# Patient Record
Sex: Female | Born: 1945 | Race: White | Hispanic: No | Smoking: Never smoker
Health system: Southern US, Community
[De-identification: ages and names within clinical notes are randomized; demographics above are authoritative.]

## PROBLEM LIST (undated history)

## (undated) DIAGNOSIS — D649 Anemia, unspecified: Secondary | ICD-10-CM

## (undated) DIAGNOSIS — E1169 Type 2 diabetes mellitus with other specified complication: Secondary | ICD-10-CM

## (undated) DIAGNOSIS — F32A Depression, unspecified: Secondary | ICD-10-CM

## (undated) DIAGNOSIS — Z992 Dependence on renal dialysis: Secondary | ICD-10-CM

## (undated) DIAGNOSIS — G4733 Obstructive sleep apnea (adult) (pediatric): Secondary | ICD-10-CM

## (undated) DIAGNOSIS — E785 Hyperlipidemia, unspecified: Secondary | ICD-10-CM

## (undated) DIAGNOSIS — K921 Melena: Secondary | ICD-10-CM

## (undated) DIAGNOSIS — N189 Chronic kidney disease, unspecified: Secondary | ICD-10-CM

## (undated) DIAGNOSIS — Z9189 Other specified personal risk factors, not elsewhere classified: Secondary | ICD-10-CM

## (undated) DIAGNOSIS — T8859XA Other complications of anesthesia, initial encounter: Secondary | ICD-10-CM

## (undated) DIAGNOSIS — N186 End stage renal disease: Secondary | ICD-10-CM

## (undated) DIAGNOSIS — T753XXA Motion sickness, initial encounter: Secondary | ICD-10-CM

## (undated) DIAGNOSIS — E118 Type 2 diabetes mellitus with unspecified complications: Secondary | ICD-10-CM

## (undated) DIAGNOSIS — I10 Essential (primary) hypertension: Secondary | ICD-10-CM

## (undated) DIAGNOSIS — R0600 Dyspnea, unspecified: Secondary | ICD-10-CM

## (undated) DIAGNOSIS — M1990 Unspecified osteoarthritis, unspecified site: Secondary | ICD-10-CM

## (undated) HISTORY — PX: REPLACEMENT TOTAL KNEE BILATERAL: SUR1225

## (undated) HISTORY — PX: ABDOMINAL HYSTERECTOMY: SHX81

## (undated) HISTORY — DX: Chronic kidney disease, unspecified: N18.9

## (undated) HISTORY — DX: Unspecified osteoarthritis, unspecified site: M19.90

## (undated) HISTORY — DX: Essential (primary) hypertension: I10

## (undated) HISTORY — DX: Obstructive sleep apnea (adult) (pediatric): G47.33

## (undated) HISTORY — DX: Dependence on renal dialysis: N18.6

## (undated) HISTORY — DX: Type 2 diabetes mellitus with unspecified complications: E11.8

## (undated) HISTORY — PX: APPENDECTOMY: SHX54

## (undated) HISTORY — PX: TONSILECTOMY, ADENOIDECTOMY, BILATERAL MYRINGOTOMY AND TUBES: SHX2538

## (undated) HISTORY — DX: End stage renal disease: Z99.2

## (undated) HISTORY — DX: Melena: K92.1

## (undated) HISTORY — DX: Depression, unspecified: F32.A

## (undated) HISTORY — DX: Other specified personal risk factors, not elsewhere classified: Z91.89

## (undated) HISTORY — DX: Hyperlipidemia, unspecified: E11.69

## (undated) HISTORY — DX: Hyperlipidemia, unspecified: E78.5

## (undated) HISTORY — PX: TOTAL HIP ARTHROPLASTY: SHX124

## (undated) HISTORY — PX: CHOLECYSTECTOMY: SHX55

---

## 2021-05-14 ENCOUNTER — Other Ambulatory Visit: Payer: Self-pay

## 2021-05-15 ENCOUNTER — Ambulatory Visit (INDEPENDENT_AMBULATORY_CARE_PROVIDER_SITE_OTHER): Payer: Medicare Other | Admitting: Internal Medicine

## 2021-05-15 ENCOUNTER — Encounter: Payer: Self-pay | Admitting: Internal Medicine

## 2021-05-15 ENCOUNTER — Other Ambulatory Visit: Payer: Self-pay | Admitting: Internal Medicine

## 2021-05-15 VITALS — BP 148/82 | HR 77 | Temp 97.8°F | Ht 67.0 in | Wt 183.3 lb

## 2021-05-15 DIAGNOSIS — E1169 Type 2 diabetes mellitus with other specified complication: Secondary | ICD-10-CM

## 2021-05-15 DIAGNOSIS — E118 Type 2 diabetes mellitus with unspecified complications: Secondary | ICD-10-CM

## 2021-05-15 DIAGNOSIS — R296 Repeated falls: Secondary | ICD-10-CM

## 2021-05-15 DIAGNOSIS — I10 Essential (primary) hypertension: Secondary | ICD-10-CM | POA: Insufficient documentation

## 2021-05-15 DIAGNOSIS — E559 Vitamin D deficiency, unspecified: Secondary | ICD-10-CM | POA: Insufficient documentation

## 2021-05-15 DIAGNOSIS — D631 Anemia in chronic kidney disease: Secondary | ICD-10-CM | POA: Insufficient documentation

## 2021-05-15 DIAGNOSIS — G4733 Obstructive sleep apnea (adult) (pediatric): Secondary | ICD-10-CM

## 2021-05-15 DIAGNOSIS — E781 Pure hyperglyceridemia: Secondary | ICD-10-CM | POA: Insufficient documentation

## 2021-05-15 DIAGNOSIS — E785 Hyperlipidemia, unspecified: Secondary | ICD-10-CM

## 2021-05-15 DIAGNOSIS — N186 End stage renal disease: Secondary | ICD-10-CM | POA: Diagnosis not present

## 2021-05-15 DIAGNOSIS — E1165 Type 2 diabetes mellitus with hyperglycemia: Secondary | ICD-10-CM | POA: Insufficient documentation

## 2021-05-15 DIAGNOSIS — M5137 Other intervertebral disc degeneration, lumbosacral region: Secondary | ICD-10-CM

## 2021-05-15 DIAGNOSIS — R7989 Other specified abnormal findings of blood chemistry: Secondary | ICD-10-CM

## 2021-05-15 DIAGNOSIS — M51379 Other intervertebral disc degeneration, lumbosacral region without mention of lumbar back pain or lower extremity pain: Secondary | ICD-10-CM

## 2021-05-15 DIAGNOSIS — Z992 Dependence on renal dialysis: Secondary | ICD-10-CM | POA: Diagnosis not present

## 2021-05-15 LAB — CBC WITH DIFFERENTIAL/PLATELET
Basophils Absolute: 0 10*3/uL (ref 0.0–0.1)
Basophils Relative: 0.2 % (ref 0.0–3.0)
Eosinophils Absolute: 0 10*3/uL (ref 0.0–0.7)
Eosinophils Relative: 0 % (ref 0.0–5.0)
HCT: 28.7 % — ABNORMAL LOW (ref 36.0–46.0)
Hemoglobin: 10.3 g/dL — ABNORMAL LOW (ref 12.0–15.0)
Lymphocytes Relative: 10 % — ABNORMAL LOW (ref 12.0–46.0)
Lymphs Abs: 1.3 10*3/uL (ref 0.7–4.0)
MCHC: 35.9 g/dL (ref 30.0–36.0)
MCV: 83.9 fl (ref 78.0–100.0)
Monocytes Absolute: 0.8 10*3/uL (ref 0.1–1.0)
Monocytes Relative: 6 % (ref 3.0–12.0)
Neutro Abs: 10.7 10*3/uL — ABNORMAL HIGH (ref 1.4–7.7)
Neutrophils Relative %: 83.8 % — ABNORMAL HIGH (ref 43.0–77.0)
Platelets: 240 10*3/uL (ref 150.0–400.0)
RBC: 3.43 Mil/uL — ABNORMAL LOW (ref 3.87–5.11)
RDW: 15.5 % (ref 11.5–15.5)
WBC: 12.8 10*3/uL — ABNORMAL HIGH (ref 4.0–10.5)

## 2021-05-15 LAB — COMPREHENSIVE METABOLIC PANEL
ALT: 11 U/L (ref 0–35)
AST: 9 U/L (ref 0–37)
Albumin: 3.5 g/dL (ref 3.5–5.2)
Alkaline Phosphatase: 60 U/L (ref 39–117)
BUN: 81 mg/dL — ABNORMAL HIGH (ref 6–23)
CO2: 28 mEq/L (ref 19–32)
Calcium: 8 mg/dL — ABNORMAL LOW (ref 8.4–10.5)
Chloride: 91 mEq/L — ABNORMAL LOW (ref 96–112)
Creatinine, Ser: 6.41 mg/dL (ref 0.40–1.20)
GFR: 6.03 mL/min — CL (ref >60.00)
Glucose, Bld: 558 mg/dL (ref 70–99)
Potassium: 3.7 mEq/L (ref 3.5–5.1)
Sodium: 130 mEq/L — ABNORMAL LOW (ref 135–145)
Total Bilirubin: 0.4 mg/dL (ref 0.2–1.2)
Total Protein: 5.9 g/dL — ABNORMAL LOW (ref 6.0–8.3)

## 2021-05-15 LAB — TSH: TSH: 0.23 u[IU]/mL — ABNORMAL LOW (ref 0.35–4.50)

## 2021-05-15 LAB — POCT GLYCOSYLATED HEMOGLOBIN (HGB A1C): Hemoglobin A1C: 10.3 % — AB (ref 4.0–5.6)

## 2021-05-15 LAB — LDL CHOLESTEROL, DIRECT: Direct LDL: 77 mg/dL

## 2021-05-15 LAB — LIPID PANEL
Cholesterol: 224 mg/dL — ABNORMAL HIGH (ref 0–200)
HDL: 35.3 mg/dL — ABNORMAL LOW (ref >39.00)
Total CHOL/HDL Ratio: 6
Triglycerides: 526 mg/dL — ABNORMAL HIGH (ref 0.0–149.0)

## 2021-05-15 LAB — VITAMIN B12: Vitamin B-12: 267 pg/mL (ref 211–911)

## 2021-05-15 LAB — VITAMIN D 25 HYDROXY (VIT D DEFICIENCY, FRACTURES): VITD: 9.73 ng/mL — ABNORMAL LOW (ref 30.00–100.00)

## 2021-05-15 MED ORDER — ATORVASTATIN CALCIUM 80 MG PO TABS: 80.0000 mg | ORAL_TABLET | Freq: Every day | ORAL | 1 refills | Status: DC

## 2021-05-15 MED ORDER — VITAMIN D (ERGOCALCIFEROL) 1.25 MG (50000 UNIT) PO CAPS: 50000.0000 [IU] | ORAL_CAPSULE | ORAL | 0 refills | Status: AC

## 2021-05-15 MED ORDER — INSULIN GLARGINE 100 UNIT/ML SOLOSTAR PEN
5.0000 [IU] | PEN_INJECTOR | Freq: Every day | SUBCUTANEOUS | 4 refills | Status: DC
Start: 2021-05-15 — End: 2021-08-15

## 2021-05-15 NOTE — Addendum Note (Signed)
Addended by: Wyvonne Lenz on: 05/15/2021 03:52 PM   Modules accepted: Orders

## 2021-05-15 NOTE — Patient Instructions (Signed)
-  Nice seeing you today!!  -Lab work today; will notify you once results are available.  -Referrals to nephrology, neurology and endocrinology today.  -Schedule follow up in 3 months.

## 2021-05-15 NOTE — Addendum Note (Signed)
Addended by: Wyvonne Lenz on: 05/15/2021 03:07 PM   Modules accepted: Orders

## 2021-05-15 NOTE — Progress Notes (Signed)
New Patient Office Visit     This visit occurred during the SARS-CoV-2 public health emergency.  Safety protocols were in place, including screening questions prior to the visit, additional usage of staff PPE, and extensive cleaning of exam room while observing appropriate contact time as indicated for disinfecting solutions.    CC/Reason for Visit: Establish care, discuss chronic conditions Previous PCP: In [place] Last Visit: Few months ago  HPI: Felicia Thompson is a 75 y.o. female who is coming in today for the above mentioned reasons.  She has an extensive recent past, however I have reviewed records available to me today.  She moved from [place] just shy of a month ago to be closer to family.  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  She has not yet established care with nephrology in the area.  She brought supplies from home.  She has already been hooked up with [facility] nursing.  She has a history of uncontrolled hypertension, diabetes, hyperlipidemia.  She was recently diagnosed with sleep apnea but does not have a CPAP machine or supplies.  She has been falling recently.  She has had 2 falls in the last 2 weeks.  During the first fall she had significant back pain, saw local orthopedics, x-rays showed significant back arthritis and possibly a slipped disc.  MRI is in process.  Daughter believes she might be having some depression and is requesting counseling services.  She was being worked up for a kidney transplant prior to leaving [place].  She had a recent mammogram, Pap smear and colonoscopy which she will try and obtain records for.  Past Medical/Surgical History: Past Medical History:  Diagnosis Date   DM (diabetes mellitus), type 2 with complications ([place])    ESRD on peritoneal dialysis ([place])    Hyperlipidemia associated with type 2 diabetes mellitus ([place])    Hypertension    OSA (obstructive sleep apnea)     History  reviewed. No pertinent surgical history.  Social History:  has no history on file for tobacco use, alcohol use, and drug use.  Allergies: Not on File  Family History:  History reviewed. No pertinent family history.   Current Outpatient Medications:    atorvastatin (LIPITOR) 40 MG tablet, Take 40 mg by mouth daily., Disp: , Rfl:    calcium carbonate (TUMS EX) 750 MG chewable tablet, Chew 1 tablet by mouth 2 (two) times daily., Disp: , Rfl:    carvedilol (COREG) 25 MG tablet, Take 25 mg by mouth 2 (two) times daily with a meal., Disp: , Rfl:    cyclobenzaprine (FLEXERIL) 5 MG tablet, Take 5 mg by mouth 3 (three) times daily., Disp: , Rfl:    diphenhydramine-acetaminophen (TYLENOL PM) 25-500 MG TABS tablet, Take 1 tablet by mouth at bedtime as needed., Disp: , Rfl:    glipiZIDE (GLUCOTROL) 5 MG tablet, Take by mouth 2 (two) times daily before a meal., Disp: , Rfl:    hydrALAZINE (APRESOLINE) 50 MG tablet, Take 50 mg by mouth 3 (three) times daily., Disp: , Rfl:    losartan (COZAAR) 25 MG tablet, Take 25 mg by mouth 2 (two) times daily., Disp: , Rfl:    Probiotic Product ([employer] ADV PROBIOTIC GUMMIES PO), Take by mouth., Disp: , Rfl:   Review of Systems:  Constitutional: Denies fever, chills, diaphoresis, appetite change and fatigue.  HEENT: Denies photophobia, eye pain, redness, hearing loss, ear pain, congestion, sore throat, rhinorrhea, sneezing, mouth sores, trouble swallowing, neck  pain, neck stiffness and tinnitus.   Respiratory: Denies SOB, DOE, cough, chest tightness,  and wheezing.   Cardiovascular: Denies chest pain, palpitations and leg swelling.  Gastrointestinal: Denies nausea, vomiting, abdominal pain, diarrhea, constipation, blood in stool and abdominal distention.  Genitourinary: Denies dysuria, urgency, frequency, hematuria, flank pain and difficulty urinating.  Endocrine: Denies: hot or cold intolerance, sweats, changes in hair or nails, polyuria,  polydipsia. Musculoskeletal: Denies myalgias,  joint swelling, arthralgias and gait problem.  Skin: Denies pallor, rash and wound.  Neurological: Denies dizziness, seizures, syncope, weakness, light-headedness, numbness and headaches.  Hematological: Denies adenopathy. Easy bruising, personal or family bleeding history  Psychiatric/Behavioral: Denies suicidal ideation, mood changes, confusion, nervousness, sleep disturbance and agitation    Physical Exam: Vitals:   05/15/21 1343  BP: (!) 148/82  Pulse: 77  Temp: 97.8 F (36.6 C)  TempSrc: Oral  SpO2: 96%  Weight: 183 lb 5 oz (83.2 kg)  Height: '5\' 7"'$  (1.702 m)   Body mass index is 28.71 kg/m.  Constitutional: NAD, calm, comfortable Eyes: PERRL, lids and conjunctivae normal, wears corrective lenses ENMT: Mucous membranes are moist.  Respiratory: clear to auscultation bilaterally, no wheezing, no crackles. Normal respiratory effort. No accessory muscle use.  Cardiovascular: Regular rate and rhythm, no murmurs / rubs / gallops.  Neurologic: Grossly intact and nonfocal Psychiatric: Normal judgment and insight. Alert and oriented x 3. Normal mood.    Impression and Plan:  ESRD on peritoneal dialysis [facility])  -I will place an urgent referral to nephrology, check labs today.  OSA (obstructive sleep apnea)  - Plan: Ambulatory referral to Neurology/sleep medicine.  DM (diabetes mellitus), type 2 with complications ([place])  - Plan: Ambulatory referral to Endocrinology -Uncontrolled with an A1c of 10.3 today. -Glipizide probably not best medication given advanced kidney failure.  Hyperlipidemia associated with type 2 diabetes mellitus ([place]) -  Plan: Lipid panel -Currently on atorvastatin 40 mg daily.  Primary hypertension -Uncontrolled, likely due to to advanced renal failure, send referral to nephrology.  Frequent falls -Check TSH, vitamin D, vitamin B12. -Arrange for home health PT and OT.  DDD (degenerative disc disease),  lumbosacral -Under current work-up by orthopedics, MRI to happen soon.    Patient Instructions  -Nice seeing you today!!  -Lab work today; will notify you once results are available.  -Referrals to nephrology, neurology and endocrinology today.  -Schedule follow up in 3 months.    Lelon Frohlich, MD [facility]

## 2021-05-15 NOTE — Addendum Note (Signed)
Addended by: Amanda Cockayne on: 05/15/2021 04:41 PM   Modules accepted: Orders

## 2021-05-16 ENCOUNTER — Other Ambulatory Visit: Payer: Medicare Other

## 2021-05-16 LAB — T3, FREE: T3, Free: 1.9 pg/mL — ABNORMAL LOW (ref 2.3–4.2)

## 2021-05-16 LAB — T4, FREE: Free T4: 1.1 ng/dL (ref 0.60–1.60)

## 2021-05-16 NOTE — Addendum Note (Signed)
Addended by: Agnes Lawrence on: 05/16/2021 04:15 PM   Modules accepted: Orders

## 2021-05-21 ENCOUNTER — Telehealth: Payer: Self-pay | Admitting: Internal Medicine

## 2021-05-21 NOTE — Telephone Encounter (Signed)
Pts daughter called in stating that the pts blood sugars are running high and they have not been giving her the insulin due to them not knowing that they are needing needle to administer it to the pt. She stated that the pts sugars today before she called in and the pt had not eaten and it was 325 and did not know what to do call was transferred to the triage nurse for further assistance.

## 2021-05-22 MED ORDER — PEN NEEDLES 31G X 8 MM MISC: 1.0000 | Freq: Every day | 3 refills | Status: DC

## 2021-05-22 NOTE — Telephone Encounter (Signed)
Spoke with daughter and Rx for pen needles sent

## 2021-05-28 ENCOUNTER — Telehealth: Payer: Self-pay | Admitting: Internal Medicine

## 2021-05-28 NOTE — Telephone Encounter (Signed)
Felicia Thompson,Pt with [facility] is calling needing verbal order for PT 2 week 2,  1 week 5 for strengthening, balance and endurance.  May leave a detail msg on his secured voice mail.

## 2021-05-29 NOTE — Telephone Encounter (Signed)
Left detailed message on machine for Felicia Thompson with verbal orders for as requested.

## 2021-05-29 NOTE — Telephone Encounter (Signed)
Okay for verbal orders. 

## 2021-07-17 ENCOUNTER — Institutional Professional Consult (permissible substitution): Payer: Medicare Other | Admitting: Neurology

## 2021-08-15 ENCOUNTER — Other Ambulatory Visit: Payer: Self-pay

## 2021-08-15 ENCOUNTER — Ambulatory Visit (INDEPENDENT_AMBULATORY_CARE_PROVIDER_SITE_OTHER): Payer: Medicare Other | Admitting: Endocrinology

## 2021-08-15 VITALS — BP 144/64 | HR 86 | Ht 67.0 in | Wt 179.6 lb

## 2021-08-15 DIAGNOSIS — E118 Type 2 diabetes mellitus with unspecified complications: Secondary | ICD-10-CM | POA: Diagnosis not present

## 2021-08-15 LAB — POCT GLYCOSYLATED HEMOGLOBIN (HGB A1C): Hemoglobin A1C: 9 % — AB (ref 4.0–5.6)

## 2021-08-15 MED ORDER — NOVOLIN 70/30 RELION (70-30) 100 UNIT/ML ~~LOC~~ SUSP: 10.0000 [IU] | Freq: Every day | SUBCUTANEOUS | 11 refills | Status: DC

## 2021-08-15 MED ORDER — FREESTYLE LIBRE 2 READER DEVI: 1.0000 | Freq: Once | 1 refills | Status: AC

## 2021-08-15 MED ORDER — FREESTYLE LIBRE 2 SENSOR MISC: 1.0000 | 3 refills | Status: DC

## 2021-08-15 NOTE — Patient Instructions (Addendum)
good diet and exercise significantly improve the control of your diabetes.  please let me know if you wish to be referred to a dietician.  high blood sugar is very risky to your health.  you should see an eye doctor and dentist every year.  It is very important to get all recommended vaccinations.  Controlling your blood pressure and cholesterol drastically reduces the damage diabetes does to your body.  Those who smoke should quit.  Please discuss these with your doctor.  check your blood sugar twice a day.  vary the time of day when you check, between before the 3 meals, and at bedtime.  also check if you have symptoms of your blood sugar being too high or too low.  please keep a record of the readings and bring it to your next appointment here (or you can bring the meter itself).  You can write it on any piece of paper.  please call us sooner if your blood sugar goes below 70, or if most of your readings are over 200. We will need to take this complex situation in stages For now, please continue the same glipizide, and: I have sent 2 prescriptions to your pharmacy: to change Lantus to 70/30 insulin, 10 units with breakfast, and for the continuous glucose monitor On this type of insulin schedule, you should eat meals on a regular schedule.  If a meal is missed or significantly delayed, your blood sugar could go low. Please call or message [place] next week, to tell us how the blood sugar is doing.   Please come back for a follow-up appointment in 2 months.

## 2021-08-15 NOTE — Progress Notes (Signed)
Subjective:    Patient ID: Felicia Thompson, female    DOB: 09-Jul-1946, 75 y.o.   MRN: [id number]  HPI Pt is here with dtr, who cares for her, and provides some hx, due to pt's memory loss.  pt is referred by Dr Doreatha Lew, for diabetes.  Pt states DM was dx'ed in [id number]; it is complicated by ESRD (due to NSAID); she takes overnight PD; she has been on insulin 2 mos ago; pt says her diet and exercise are not good; she has never had GDM, pancreatic surgery, severe hypoglycemia or DKA.  She takes glipizide and Lantus 5/d.  She had pancreatitis ([place]) in approx 2000.  she brings a record of her cbg's which I have reviewed today.  Cbg varies from 97-498.  It is in general higher as the day goes on.  She says meds are too expensive.   Past Medical History:  Diagnosis Date   Arthritis    Blood in stool    when appendix burst   Chronic kidney disease    Depression    DM (diabetes mellitus), type 2 with complications ([facility])    ESRD on peritoneal dialysis ([place])    History of fainting spells of unknown cause    Hyperlipidemia    Hyperlipidemia associated with type 2 diabetes mellitus ([facility])    Hypertension    OSA (obstructive sleep apnea)     Past Surgical History:  Procedure Laterality Date   ABDOMINAL HYSTERECTOMY     APPENDECTOMY     CHOLECYSTECTOMY     TONSILECTOMY, ADENOIDECTOMY, BILATERAL MYRINGOTOMY AND TUBES      Social History   Socioeconomic History   Marital status: Widowed    Spouse name: Not on file   Number of children: Not on file   Years of education: Not on file   Highest education level: Not on file  Occupational History   Not on file  Tobacco Use   Smoking status: Never   Smokeless tobacco: Never  Substance and Sexual Activity   Alcohol use: Never   Drug use: Never   Sexual activity: Not Currently  Other Topics Concern   Not on file  Social History Narrative   Not on file   Social Determinants of Health   Financial Resource Strain: Not on file  Food  Insecurity: Not on file  Transportation Needs: Not on file  Physical Activity: Not on file  Stress: Not on file  Social Connections: Not on file  Intimate Partner Violence: Not on file    Current Outpatient Medications on File Prior to Visit  Medication Sig Dispense Refill   atorvastatin (LIPITOR) 80 MG tablet Take 1 tablet (80 mg total) by mouth daily. 90 tablet 1   calcium carbonate (TUMS EX) 750 MG chewable tablet Chew 1 tablet by mouth 2 (two) times daily.     carvedilol (COREG) 25 MG tablet Take 25 mg by mouth 2 (two) times daily with a meal.     cyclobenzaprine (FLEXERIL) 5 MG tablet Take 5 mg by mouth 3 (three) times daily.     diphenhydramine-acetaminophen (TYLENOL PM) 25-500 MG TABS tablet Take 1 tablet by mouth at bedtime as needed.     hydrALAZINE (APRESOLINE) 50 MG tablet Take 50 mg by mouth 3 (three) times daily.     losartan (COZAAR) 25 MG tablet Take 25 mg by mouth 2 (two) times daily.     Probiotic Product (CVS ADV PROBIOTIC GUMMIES PO) Take by mouth.     No current  facility-administered medications on file prior to visit.    Allergies  Allergen Reactions   Tape Other (See Comments)    Blisters   Tetracyclines & Related Rash    Family History  Problem Relation Age of Onset   Hearing loss Father    Hyperlipidemia Father    Heart disease Father    Early death Father    Cancer Father    Early death Brother    Drug abuse Brother    Diabetes Brother    Depression Brother    Alcohol abuse Brother    Miscarriages / [place] Daughter    Arthritis Daughter    Depression Daughter    Early death Maternal Grandmother    Heart attack Maternal Grandmother    Heart disease Maternal Grandmother    Hearing loss Paternal Grandmother    Heart disease Paternal Grandmother    Heart disease Paternal Grandfather    Early death Paternal Grandfather    Heart attack Paternal Grandfather     BP (!) 144/64 (BP Location: Right Arm, Patient Position: Sitting, Cuff Size:  Large)   Pulse 86   Ht '5\' 7"'$  (1.702 m)   Wt 179 lb 9.6 oz (81.5 kg)   SpO2 98%   BMI 28.13 kg/m   Review of Systems denies sob, and N/V/HB. She has lost 20 lbs x 8 mos.      Objective:   Physical Exam Pulses: dorsalis pedis intact bilat.   MSK: no deformity of the feet CV: 1+ bilat leg edema Skin:  no ulcer on the feet.  normal color and temp on the feet. Neuro: sensation is intact to touch on the feet, but decreased from normal Ext: there is bilateral onychomycosis of the toenails  Lab Results  Component Value Date   CREATININE 6.41 (HH) 05/15/2021   BUN 81 (H) 05/15/2021   NA 130 (L) 05/15/2021   K 3.7 05/15/2021   CL 91 (L) 05/15/2021   CO2 28 05/15/2021   Lab Results  Component Value Date   HGBA1C 9.0 (A) 08/15/2021        Assessment & Plan:  Insulin-requiring type 2 DM: uncontrolled.  Therapy is limited by pt's request for least expensive meds  Patient Instructions  good diet and exercise significantly improve the control of your diabetes.  please let me know if you wish to be referred to a dietician.  high blood sugar is very risky to your health.  you should see an eye doctor and dentist every year.  It is very important to get all recommended vaccinations.  Controlling your blood pressure and cholesterol drastically reduces the damage diabetes does to your body.  Those who smoke should quit.  Please discuss these with your doctor.  check your blood sugar twice a day.  vary the time of day when you check, between before the 3 meals, and at bedtime.  also check if you have symptoms of your blood sugar being too high or too low.  please keep a record of the readings and bring it to your next appointment here (or you can bring the meter itself).  You can write it on any piece of paper.  please call us sooner if your blood sugar goes below 70, or if most of your readings are over 200. We will need to take this complex situation in stages For now, please continue the  same glipizide, and: I have sent 2 prescriptions to your pharmacy: to change Lantus to 70/30 insulin, 10 units with  breakfast, and for the continuous glucose monitor On this type of insulin schedule, you should eat meals on a regular schedule.  If a meal is missed or significantly delayed, your blood sugar could go low. Please call or message [place] next week, to tell us how the blood sugar is doing.   Please come back for a follow-up appointment in 2 months.

## 2021-08-16 ENCOUNTER — Telehealth: Payer: Self-pay | Admitting: Pharmacy Technician

## 2021-08-16 NOTE — Telephone Encounter (Signed)
Patient Advocate Encounter   Received notification from [facility] that prior authorization for NOVOLIN 70/30 is required.   PA submitted on 08/16/21 Key [id number] Status is pending    [facility] will continue to follow   Burney Gauze, CPhT Patient [facility] Phone: [phone] Fax:  [phone]

## 2021-08-16 NOTE — Telephone Encounter (Signed)
Received a fax regarding Prior Authorization from [facility] for [place] 70/30. Authorization has been DENIED because PT'S PLAN PREFERS [place] 70/30 [id number].

## 2021-08-17 ENCOUNTER — Other Ambulatory Visit (HOSPITAL_COMMUNITY): Payer: Self-pay

## 2021-08-17 ENCOUNTER — Other Ambulatory Visit: Payer: Medicare Other

## 2021-08-17 ENCOUNTER — Other Ambulatory Visit: Payer: Self-pay | Admitting: Endocrinology

## 2021-08-17 MED ORDER — HUMULIN 70/30 (70-30) 100 UNIT/ML ~~LOC~~ SUSP: 10.0000 [IU] | Freq: Every day | SUBCUTANEOUS | 11 refills | Status: AC

## 2021-08-17 NOTE — Telephone Encounter (Signed)
Ok great. Please write/submit a new script for Humulin 70/30 for the pharmacy to process. Thanks, have a great day!

## 2021-09-01 DIAGNOSIS — E875 Hyperkalemia: Secondary | ICD-10-CM | POA: Diagnosis not present

## 2021-09-01 DIAGNOSIS — N186 End stage renal disease: Secondary | ICD-10-CM | POA: Diagnosis not present

## 2021-09-01 DIAGNOSIS — R82998 Other abnormal findings in urine: Secondary | ICD-10-CM | POA: Diagnosis not present

## 2021-09-01 DIAGNOSIS — N2581 Secondary hyperparathyroidism of renal origin: Secondary | ICD-10-CM | POA: Diagnosis not present

## 2021-09-01 DIAGNOSIS — D631 Anemia in chronic kidney disease: Secondary | ICD-10-CM | POA: Diagnosis not present

## 2021-09-01 DIAGNOSIS — Z992 Dependence on renal dialysis: Secondary | ICD-10-CM | POA: Diagnosis not present

## 2021-09-01 DIAGNOSIS — D509 Iron deficiency anemia, unspecified: Secondary | ICD-10-CM | POA: Diagnosis not present

## 2021-09-01 DIAGNOSIS — Z79899 Other long term (current) drug therapy: Secondary | ICD-10-CM | POA: Diagnosis not present

## 2021-09-02 DIAGNOSIS — N2581 Secondary hyperparathyroidism of renal origin: Secondary | ICD-10-CM | POA: Diagnosis not present

## 2021-09-02 DIAGNOSIS — E875 Hyperkalemia: Secondary | ICD-10-CM | POA: Diagnosis not present

## 2021-09-02 DIAGNOSIS — N186 End stage renal disease: Secondary | ICD-10-CM | POA: Diagnosis not present

## 2021-09-02 DIAGNOSIS — Z992 Dependence on renal dialysis: Secondary | ICD-10-CM | POA: Diagnosis not present

## 2021-09-02 DIAGNOSIS — D509 Iron deficiency anemia, unspecified: Secondary | ICD-10-CM | POA: Diagnosis not present

## 2021-09-02 DIAGNOSIS — R82998 Other abnormal findings in urine: Secondary | ICD-10-CM | POA: Diagnosis not present

## 2021-09-02 DIAGNOSIS — Z79899 Other long term (current) drug therapy: Secondary | ICD-10-CM | POA: Diagnosis not present

## 2021-09-02 DIAGNOSIS — D631 Anemia in chronic kidney disease: Secondary | ICD-10-CM | POA: Diagnosis not present

## 2021-09-03 ENCOUNTER — Other Ambulatory Visit: Payer: Self-pay

## 2021-09-03 ENCOUNTER — Emergency Department: Payer: Medicare Other

## 2021-09-03 ENCOUNTER — Emergency Department
Admission: EM | Admit: 2021-09-03 | Discharge: 2021-09-03 | Disposition: A | Discharge status: Home / Self Care | Payer: Medicare Other | Attending: Emergency Medicine | Admitting: Emergency Medicine

## 2021-09-03 DIAGNOSIS — R4182 Altered mental status, unspecified: Secondary | ICD-10-CM | POA: Diagnosis not present

## 2021-09-03 DIAGNOSIS — Z7984 Long term (current) use of oral hypoglycemic drugs: Secondary | ICD-10-CM | POA: Insufficient documentation

## 2021-09-03 DIAGNOSIS — Z79899 Other long term (current) drug therapy: Secondary | ICD-10-CM | POA: Insufficient documentation

## 2021-09-03 DIAGNOSIS — I120 Hypertensive chronic kidney disease with stage 5 chronic kidney disease or end stage renal disease: Secondary | ICD-10-CM | POA: Diagnosis not present

## 2021-09-03 DIAGNOSIS — R42 Dizziness and giddiness: Secondary | ICD-10-CM | POA: Diagnosis not present

## 2021-09-03 DIAGNOSIS — D631 Anemia in chronic kidney disease: Secondary | ICD-10-CM | POA: Diagnosis not present

## 2021-09-03 DIAGNOSIS — N186 End stage renal disease: Secondary | ICD-10-CM | POA: Diagnosis not present

## 2021-09-03 DIAGNOSIS — I10 Essential (primary) hypertension: Secondary | ICD-10-CM | POA: Diagnosis not present

## 2021-09-03 DIAGNOSIS — R404 Transient alteration of awareness: Secondary | ICD-10-CM

## 2021-09-03 DIAGNOSIS — Z992 Dependence on renal dialysis: Secondary | ICD-10-CM | POA: Diagnosis not present

## 2021-09-03 DIAGNOSIS — D509 Iron deficiency anemia, unspecified: Secondary | ICD-10-CM | POA: Diagnosis not present

## 2021-09-03 DIAGNOSIS — R82998 Other abnormal findings in urine: Secondary | ICD-10-CM | POA: Diagnosis not present

## 2021-09-03 DIAGNOSIS — Z794 Long term (current) use of insulin: Secondary | ICD-10-CM | POA: Insufficient documentation

## 2021-09-03 DIAGNOSIS — N2581 Secondary hyperparathyroidism of renal origin: Secondary | ICD-10-CM | POA: Diagnosis not present

## 2021-09-03 DIAGNOSIS — E1122 Type 2 diabetes mellitus with diabetic chronic kidney disease: Secondary | ICD-10-CM | POA: Insufficient documentation

## 2021-09-03 DIAGNOSIS — E875 Hyperkalemia: Secondary | ICD-10-CM | POA: Diagnosis not present

## 2021-09-03 LAB — COMPREHENSIVE METABOLIC PANEL
ALT: 33 U/L (ref 0–44)
AST: 22 U/L (ref 15–41)
Albumin: 3.1 g/dL — ABNORMAL LOW (ref 3.5–5.0)
Alkaline Phosphatase: 44 U/L (ref 38–126)
Anion gap: 13 (ref 5–15)
BUN: 76 mg/dL — ABNORMAL HIGH (ref 8–23)
CO2: 25 mmol/L (ref 22–32)
Calcium: 8.4 mg/dL — ABNORMAL LOW (ref 8.9–10.3)
Chloride: 99 mmol/L (ref 98–111)
Creatinine, Ser: 9.37 mg/dL — ABNORMAL HIGH (ref 0.44–1.00)
GFR, Estimated: 4 mL/min — ABNORMAL LOW (ref >60)
Glucose, Bld: 166 mg/dL — ABNORMAL HIGH (ref 70–99)
Potassium: 3.8 mmol/L (ref 3.5–5.1)
Sodium: 137 mmol/L (ref 135–145)
Total Bilirubin: 0.7 mg/dL (ref 0.3–1.2)
Total Protein: 5.5 g/dL — ABNORMAL LOW (ref 6.5–8.1)

## 2021-09-03 LAB — CBC
HCT: 26.2 % — ABNORMAL LOW (ref 36.0–46.0)
Hemoglobin: 9.6 g/dL — ABNORMAL LOW (ref 12.0–15.0)
MCH: 31.6 pg (ref 26.0–34.0)
MCHC: 36.6 g/dL — ABNORMAL HIGH (ref 30.0–36.0)
MCV: 86.2 fL (ref 80.0–100.0)
Platelets: 217 10*3/uL (ref 150–400)
RBC: 3.04 MIL/uL — ABNORMAL LOW (ref 3.87–5.11)
RDW: 13.2 % (ref 11.5–15.5)
WBC: 7.6 10*3/uL (ref 4.0–10.5)
nRBC: 0 % (ref 0.0–0.2)

## 2021-09-03 LAB — TROPONIN I (HIGH SENSITIVITY)
Troponin I (High Sensitivity): 20 ng/L — ABNORMAL HIGH (ref <18)
Troponin I (High Sensitivity): 18 ng/L — ABNORMAL HIGH (ref <18)

## 2021-09-03 NOTE — ED Provider Notes (Signed)
Emergency Medicine Provider Triage Evaluation Note  Felicia Thompson, a 75 y.o. female  was evaluated in triage.  Pt complains of presents with daughter, for complaints of some periods of catatonic episodes and shuffling gait over the last week.  Patient apparently becomes limp after the episode, and experiences incontinence of bowel.  She has had a couple of falls last week resulted in head injury and hip injury.  No nausea, vomiting, or paralysis is otherwise reported..  Review of Systems  Positive: "Catatonic" episodes Negative: CP, SOB  Physical Exam  BP (!) 95/52 (BP Location: Right Arm)   Pulse 87   Temp 98.2 F (36.8 C) (Oral)   Resp 16   Ht '5\' 7"'$  (1.702 m)   Wt 78.5 kg   SpO2 96%   BMI 27.10 kg/m  Gen:   Awake, no distress  NAD Resp:  Normal effort CTA MSK:   Moves extremities without difficulty  Other:  CVS: RRR  Medical Decision Making  Medically screening exam initiated at 1:19 PM.  Appropriate orders placed.  Felicia Thompson was informed that the remainder of the evaluation will be completed by another provider, this initial triage assessment does not replace that evaluation, and the importance of remaining in the ED until their evaluation is complete.  Patient ED evaluation of episodes of catatonic behavior, over the last week.   Felicia Needles, PA-C 09/03/21 1320    Blake Divine, MD 09/03/21 [phone]

## 2021-09-03 NOTE — ED Notes (Signed)
Pt reports that she is starting to feel "funny" bp rechecked at 85/51

## 2021-09-03 NOTE — Discharge Instructions (Signed)
It sounds like you may be having intermittent seizures.  Please follow-up with a neurologist for further investigation and diagnosis.  Your CAT scan of your brain today did not show any acute abnormalities.  Your blood work was also reassuring.  Please do not drive and avoid activities where you could harm yourself if you were to fall.

## 2021-09-03 NOTE — ED Provider Notes (Signed)
[facility]  ____________________________________________   Event Date/Time   First MD Initiated Contact with Patient 09/03/21 1840     (approximate)  I have reviewed the triage vital signs and the nursing notes.   HISTORY  Chief Complaint Fall    HPI Felicia Thompson is a 75 y.o. female with past medical history of end-stage renal disease on peritoneal dialysis who presents with episodes of altered mental status.  Patient's daughter is at bedside and provides much of the history.  Since last February patient has had intermittent spells where she stares off into space and has increased tone.  Her eyes are open during these episodes without gaze deviation.  She is however not conscious and does not respond to commands.  Her daughter notes that she is very stiff with increased tone during these episodes.  She will then lose tone and has to be caught or she will fall.  There is no tonic-clonic activity.  She is somewhat confused afterward.  These occur while she is standing.  Patient does endorse feeling somewhat abnormal prior to the episodes.  Initially patient admits she feels dizzy but then describes this as "just not feeling right" has had bowel incontinence during episodes, no urinary incontinence but patient does not make much urine given she is on dialysis.  These episodes happened occasionally over the last several months but more frequently within the last 2 weeks.  Last episode was on Saturday.  Patient did have a fall and hit her head and left hip.  Patient denies any palpitations chest pain shortness of breath nausea or vomiting.  No abdominal pain.  Patient has not yet been evaluated by a physician for this.  They spoke with the primary care provider today who told him to come to the emergency department which is why they are here today.         Past Medical History:  Diagnosis Date   Arthritis    Blood in stool    when appendix burst   Chronic  kidney disease    Depression    DM (diabetes mellitus), type 2 with complications ([place])    ESRD on peritoneal dialysis ([place])    History of fainting spells of unknown cause    Hyperlipidemia    Hyperlipidemia associated with type 2 diabetes mellitus ([place])    Hypertension    OSA (obstructive sleep apnea)     Patient Active Problem List   Diagnosis Date Noted   ESRD on peritoneal dialysis ([place]) 05/15/2021   OSA (obstructive sleep apnea) 05/15/2021   DM (diabetes mellitus), type 2 with complications ([place]) [id number]   Hypertension 05/15/2021   Hyperlipidemia associated with type 2 diabetes mellitus ([place]) 05/15/2021   Vitamin D deficiency 05/15/2021   Abnormal TSH 05/15/2021   Hypertriglyceridemia 05/15/2021   Anemia in chronic kidney disease, on chronic dialysis ([place]) 05/15/2021    Past Surgical History:  Procedure Laterality Date   ABDOMINAL HYSTERECTOMY     APPENDECTOMY     CHOLECYSTECTOMY     TONSILECTOMY, ADENOIDECTOMY, BILATERAL MYRINGOTOMY AND TUBES      Prior to Admission medications   Medication Sig Start Date End Date Taking? Authorizing Provider  atorvastatin (LIPITOR) 80 MG tablet Take 1 tablet (80 mg total) by mouth daily. 05/15/21   Isaac Bliss, Rayford Halsted, MD  calcium carbonate (TUMS EX) 750 MG chewable tablet Chew 1 tablet by mouth 2 (two) times daily.    [provider]  carvedilol (COREG) 25 MG tablet  Take 25 mg by mouth 2 (two) times daily with a meal.    [provider]  Continuous Blood Gluc Sensor (FREESTYLE LIBRE 2 SENSOR) MISC 1 Device by Does not apply route every 14 (fourteen) days. 08/15/21   Renato Shin, MD  cyclobenzaprine (FLEXERIL) 5 MG tablet Take 5 mg by mouth 3 (three) times daily.    [provider]  diphenhydramine-acetaminophen (TYLENOL PM) 25-500 MG TABS tablet Take 1 tablet by mouth at bedtime as needed.    [provider]  hydrALAZINE (APRESOLINE) 50 MG tablet Take 50 mg by mouth 3 (three) times daily.     [provider]  insulin NPH-regular Human (HUMULIN 70/30) (70-30) 100 UNIT/ML injection Inject 10 Units into the skin daily with breakfast. 08/17/21   Renato Shin, MD  losartan (COZAAR) 25 MG tablet Take 25 mg by mouth 2 (two) times daily.    [provider]  Probiotic Product ([employer] ADV PROBIOTIC GUMMIES PO) Take by mouth.    [provider]    Allergies Tape and Tetracyclines & related  Family History  Problem Relation Age of Onset   Hearing loss Father    Hyperlipidemia Father    Heart disease Father    Early death Father    Cancer Father    Early death Brother    Drug abuse Brother    Diabetes Brother    Depression Brother    Alcohol abuse Brother    Miscarriages / [place] Daughter    Arthritis Daughter    Depression Daughter    Early death Maternal Grandmother    Heart attack Maternal Grandmother    Heart disease Maternal Grandmother    Hearing loss Paternal Grandmother    Heart disease Paternal Grandmother    Heart disease Paternal Grandfather    Early death Paternal Grandfather    Heart attack Paternal Grandfather     Social History Social History   Tobacco Use   Smoking status: Never   Smokeless tobacco: Never  Substance Use Topics   Alcohol use: Never   Drug use: Never    Review of Systems   Review of Systems  Constitutional:  Negative for chills and fever.  Respiratory:  Negative for shortness of breath.   Cardiovascular:  Negative for chest pain.  Gastrointestinal:  Negative for abdominal pain, nausea and vomiting.  Musculoskeletal:  Positive for arthralgias.  Neurological:  Negative for headaches.  All other systems reviewed and are negative.  Physical Exam Updated Vital Signs BP (!) 160/66 (BP Location: Left Arm)   Pulse 75   Temp 98.2 F (36.8 C) (Oral)   Resp 16   Ht '5\' 7"'$  (1.702 m)   Wt 78.5 kg   SpO2 97%   BMI 27.10 kg/m   Physical Exam Vitals and nursing note reviewed.  Constitutional:       General: She is not in acute distress.    Appearance: Normal appearance.  HENT:     Head: Normocephalic and atraumatic.  Eyes:     General: No scleral icterus.    Conjunctiva/sclera: Conjunctivae normal.  Pulmonary:     Effort: Pulmonary effort is normal. No respiratory distress.     Breath sounds: No stridor.  Musculoskeletal:        General: No deformity or signs of injury.     Cervical back: Normal range of motion.  Skin:    General: Skin is dry.     Coloration: Skin is not jaundiced or pale.  Neurological:  General: No focal deficit present.     Mental Status: She is alert and oriented to person, place, and time. Mental status is at baseline.     Comments: Aox3, nml speech  PERRL, EOMI, face symmetric, nml tongue movement  5/5 strength in the BL upper and lower extremities  Sensation grossly intact in the BL upper and lower extremities  Finger-nose-finger intact BL   Psychiatric:        Mood and Affect: Mood normal.        Behavior: Behavior normal.     LABS (all labs ordered are listed, but only abnormal results are displayed)  Labs Reviewed  CBC - Abnormal; Notable for the following components:      Result Value   RBC 3.04 (*)    Hemoglobin 9.6 (*)    HCT 26.2 (*)    MCHC 36.6 (*)    All other components within normal limits  COMPREHENSIVE METABOLIC PANEL - Abnormal; Notable for the following components:   Glucose, Bld 166 (*)    BUN 76 (*)    Creatinine, Ser 9.37 (*)    Calcium 8.4 (*)    Total Protein 5.5 (*)    Albumin 3.1 (*)    GFR, Estimated 4 (*)    All other components within normal limits  TROPONIN I (HIGH SENSITIVITY) - Abnormal; Notable for the following components:   Troponin I (High Sensitivity) 20 (*)    All other components within normal limits  TROPONIN I (HIGH SENSITIVITY) - Abnormal; Notable for the following components:   Troponin I (High Sensitivity) 18 (*)    All other components within normal limits    ____________________________________________  EKG  Slightly prolonged QT interval normal sinus rhythm, normal axis, no acute ischemic changes ____________________________________________  RADIOLOGY I, Madelin Headings, personally viewed and evaluated these images (plain radiographs) as part of my medical decision making, as well as reviewing the written report by the radiologist.  ED MD interpretation:  I reviewed the CT scan of the brain which does not show any acute intracranial process      ____________________________________________   PROCEDURES  Procedure(s) performed (including Critical Care):  Procedures   ____________________________________________   INITIAL IMPRESSION / ASSESSMENT AND PLAN / ED COURSE     75 year old female presents for evaluation of intermittent episodes of loss of consciousness.  He has been going on for months but are more frequent over the past several weeks.  Patient only presents today because the primary care provider told her daughter to seek emergency care.  The episodes sound to me most consistent with potential seizure, occluding the fact that she has increased tone, is not conscious during seems to be confused after and may have an aura before.  Syncope also possible however less likely given she has an increase in her tone.  Patient has no other concerning symptoms including chest pain palpitations shortness of breath fevers chills or recent illness.  She is on peritoneal dialysis there is been no issues with this.  CT head was obtained which is negative for acute abnormality.  Labs also obtained which show a mildly elevated troponin of 20 and on repeat this is 18.  Patient has a nonischemic EKG and no chest pain suspect that this is elevated in the setting of her end-stage renal disease.  For me her neurologic exam is within normal limits.  I discussed with the patient and her daughter that they really need to see neurology.  Given his  unclear  this is true seizure activity do not feel that we should start her on AED at this time.  She will need likely an MRI and EEG.  We discussed absolutely no driving and avoiding activities where she could be injured if she were to lose consciousness including bathing alone or swimming etc.  Patient and her daughter were comfortable with the plan.  Clinical Course as of 09/03/21 1940  Mon Sep 03, 2021  1841 IMPRESSION: 1. No acute intracranial pathology. 2. Mild chronic white matter microangiopathy.   [KM]    Clinical Course User Index [KM] Rada Hay, MD     ____________________________________________   FINAL CLINICAL IMPRESSION(S) / ED DIAGNOSES  Final diagnoses:  Altered level of consciousness     ED Discharge Orders          Ordered    Ambulatory referral to Neurology       Comments: An appointment is requested in approximately: 1 week   09/03/21 1913             Note:  This document was prepared using Dragon voice recognition software and may include unintentional dictation errors.    Rada Hay, MD 09/03/21 [phone]

## 2021-09-03 NOTE — ED Triage Notes (Signed)
Pt is here with her family, family reports periods of being "catatonic" episodes, with shuffling gait prior to these episodes, pt becomes limp after, pt has a recent diagnosis of orthostatic hypotension and stage 4 renal disease. Episodes of dizziness, family states every other day and if they aren't beside her she collapses to the floor

## 2021-09-04 DIAGNOSIS — D631 Anemia in chronic kidney disease: Secondary | ICD-10-CM | POA: Diagnosis not present

## 2021-09-04 DIAGNOSIS — Z79899 Other long term (current) drug therapy: Secondary | ICD-10-CM | POA: Diagnosis not present

## 2021-09-04 DIAGNOSIS — N186 End stage renal disease: Secondary | ICD-10-CM | POA: Diagnosis not present

## 2021-09-04 DIAGNOSIS — N2581 Secondary hyperparathyroidism of renal origin: Secondary | ICD-10-CM | POA: Diagnosis not present

## 2021-09-04 DIAGNOSIS — E875 Hyperkalemia: Secondary | ICD-10-CM | POA: Diagnosis not present

## 2021-09-04 DIAGNOSIS — Z992 Dependence on renal dialysis: Secondary | ICD-10-CM | POA: Diagnosis not present

## 2021-09-04 DIAGNOSIS — R82998 Other abnormal findings in urine: Secondary | ICD-10-CM | POA: Diagnosis not present

## 2021-09-04 DIAGNOSIS — D509 Iron deficiency anemia, unspecified: Secondary | ICD-10-CM | POA: Diagnosis not present

## 2021-09-05 ENCOUNTER — Ambulatory Visit: Payer: Medicare Other | Admitting: Neurology

## 2021-09-05 ENCOUNTER — Telehealth: Payer: Self-pay | Admitting: Neurology

## 2021-09-05 ENCOUNTER — Encounter: Payer: Self-pay | Admitting: Neurology

## 2021-09-05 VITALS — BP 115/71 | HR 82 | Ht 67.0 in | Wt 172.0 lb

## 2021-09-05 DIAGNOSIS — Z79899 Other long term (current) drug therapy: Secondary | ICD-10-CM | POA: Diagnosis not present

## 2021-09-05 DIAGNOSIS — D509 Iron deficiency anemia, unspecified: Secondary | ICD-10-CM | POA: Diagnosis not present

## 2021-09-05 DIAGNOSIS — R569 Unspecified convulsions: Secondary | ICD-10-CM | POA: Diagnosis not present

## 2021-09-05 DIAGNOSIS — N186 End stage renal disease: Secondary | ICD-10-CM | POA: Diagnosis not present

## 2021-09-05 DIAGNOSIS — Z992 Dependence on renal dialysis: Secondary | ICD-10-CM | POA: Diagnosis not present

## 2021-09-05 DIAGNOSIS — R82998 Other abnormal findings in urine: Secondary | ICD-10-CM | POA: Diagnosis not present

## 2021-09-05 DIAGNOSIS — E875 Hyperkalemia: Secondary | ICD-10-CM | POA: Diagnosis not present

## 2021-09-05 DIAGNOSIS — D631 Anemia in chronic kidney disease: Secondary | ICD-10-CM | POA: Diagnosis not present

## 2021-09-05 DIAGNOSIS — N2581 Secondary hyperparathyroidism of renal origin: Secondary | ICD-10-CM | POA: Diagnosis not present

## 2021-09-05 NOTE — Telephone Encounter (Signed)
EMU referral sent to [facility] EMU for Dr. Hortense Ramal. Phone: [phone].

## 2021-09-05 NOTE — Patient Instructions (Addendum)
MRI Brain without contrast Routine EEG  EMU admission for capture and characterization Return in 3 months for follow up.

## 2021-09-05 NOTE — Progress Notes (Signed)
[facility]  PATIENT: Felicia Thompson DOB: Apr 30, 1946  REFERRING CLINICIAN: Isaac Bliss, [place]* HISTORY FROM: Patient and daughter  REASON FOR VISIT: Episode of freezing of gait, falls.    HISTORICAL  CHIEF COMPLAINT:  Chief Complaint  Patient presents with   New Patient (Initial Visit)    Rm 12, with daughter, today c/o light headedness and dizziness, lack of balance     HISTORY OF PRESENT ILLNESS:  This is a 75 year old woman with past medical history of hypertension, hyperlipidemia, diabetes mellitus type 2 and CKD on peritoneal dialysis who is presenting with episodes of staring, gait freezing with increased tone and fall.  Per daughter, patient symptoms have been going on for the past 8 months but worse in the last couple months where she is having almost daily episode.  Usually episodes start with patient feeling lightheaded then described feeling funny, all of a sudden she will freeze and then will get limp and and fall if no one assist her.  Episodes usually last 30 seconds followed by confusion and on 1 occasion she had bowel incontinence.  Daughter said during the episode she is awake but she is not unresponsive and does not follow any directions.  With this recurrent fall she had hit her head and had injured her hips. She denies any tonic clonic activity, denies any previous seizures, denies any history of stroke and no seizure risk factors. She is not on ASM.    OTHER MEDICAL CONDITIONS: HTN, HLD, DMII, CKD,    REVIEW OF SYSTEMS: Full 14 system review of systems performed and negative with exception of: as noted in the HPI.   ALLERGIES: Allergies  Allergen Reactions   Tape Other (See Comments)    Blisters   Tetracyclines & Related Rash    HOME MEDICATIONS: Outpatient Medications Prior to Visit  Medication Sig Dispense Refill   atorvastatin (LIPITOR) 80 MG tablet Take 1 tablet (80 mg total) by mouth daily. 90 tablet 1   calcium  carbonate (TUMS EX) 750 MG chewable tablet Chew 1 tablet by mouth 2 (two) times daily.     carvedilol (COREG) 25 MG tablet Take 25 mg by mouth 2 (two) times daily with a meal.     Continuous Blood Gluc Sensor (FREESTYLE LIBRE 2 SENSOR) MISC 1 Device by Does not apply route every 14 (fourteen) days. 6 each 3   cyclobenzaprine (FLEXERIL) 5 MG tablet Take 5 mg by mouth 3 (three) times daily.     insulin NPH-regular Human (HUMULIN 70/30) (70-30) 100 UNIT/ML injection Inject 10 Units into the skin daily with breakfast. 30 mL 11   losartan (COZAAR) 25 MG tablet Take 25 mg by mouth 2 (two) times daily.     Probiotic Product ([employer]S ADV PROBIOTIC GUMMIES PO) Take by mouth.     hydrALAZINE (APRESOLINE) 50 MG tablet Take 50 mg by mouth 3 (three) times daily.     diphenhydramine-acetaminophen (TYLENOL PM) 25-500 MG TABS tablet Take 1 tablet by mouth at bedtime as needed.     No facility-administered medications prior to visit.    PAST MEDICAL HISTORY: Past Medical History:  Diagnosis Date   Arthritis    Blood in stool    when appendix burst   Chronic kidney disease    Depression    DM (diabetes mellitus), type 2 with complications ([facility])    ESRD on peritoneal dialysis ([place])    History of fainting spells of unknown cause    Hyperlipidemia    Hyperlipidemia associated  with type 2 diabetes mellitus ([facility])    Hypertension    OSA (obstructive sleep apnea)     PAST SURGICAL HISTORY: Past Surgical History:  Procedure Laterality Date   ABDOMINAL HYSTERECTOMY     APPENDECTOMY     CHOLECYSTECTOMY     TONSILECTOMY, ADENOIDECTOMY, BILATERAL MYRINGOTOMY AND TUBES      FAMILY HISTORY: Family History  Problem Relation Age of Onset   Hearing loss Father    Hyperlipidemia Father    Heart disease Father    Early death Father    Cancer Father    Early death Brother    Drug abuse Brother    Diabetes Brother    Depression Brother    Alcohol abuse Brother    Miscarriages / [place] Daughter     Arthritis Daughter    Depression Daughter    Early death Maternal Grandmother    Heart attack Maternal Grandmother    Heart disease Maternal Grandmother    Hearing loss Paternal Grandmother    Heart disease Paternal Grandmother    Heart disease Paternal Grandfather    Early death Paternal Grandfather    Heart attack Paternal Grandfather     SOCIAL HISTORY: Social History   Socioeconomic History   Marital status: Widowed    Spouse name: Not on file   Number of children: Not on file   Years of education: Not on file   Highest education level: Not on file  Occupational History   Not on file  Tobacco Use   Smoking status: Never   Smokeless tobacco: Never  Substance and Sexual Activity   Alcohol use: Never   Drug use: Never   Sexual activity: Not Currently  Other Topics Concern   Not on file  Social History Narrative   Not on file   Social Determinants of Health   Financial Resource Strain: Not on file  Food Insecurity: Not on file  Transportation Needs: Not on file  Physical Activity: Not on file  Stress: Not on file  Social Connections: Not on file  Intimate Partner Violence: Not on file     PHYSICAL EXAM  GENERAL EXAM/CONSTITUTIONAL: Vitals:  Vitals:   09/05/21 1404  BP: 115/71  Pulse: 82  Weight: 172 lb (78 kg)  Height: '5\' 7"'  (1.702 m)   Body mass index is 26.94 kg/m. Wt Readings from Last 3 Encounters:  09/05/21 172 lb (78 kg)  09/03/21 173 lb (78.5 kg)  08/15/21 179 lb 9.6 oz (81.5 kg)   Patient is in no distress; well developed, nourished and groomed; neck is supple  EYES: Pupils round and reactive to light, Visual fields full to confrontation, Extraocular movements intacts,   MUSCULOSKELETAL: Gait, strength, tone, movements noted in Neurologic exam below  NEUROLOGIC: MENTAL STATUS:  No flowsheet data found. awake, alert, oriented to person, place and time recent and remote memory intact normal attention and concentration language  fluent, comprehension intact, naming intact fund of knowledge appropriate  CRANIAL NERVE:  2nd, 3rd, 4th, 6th - pupils equal and reactive to light, visual fields full to confrontation, extraocular muscles intact, no nystagmus 5th - facial sensation symmetric 7th - facial strength symmetric 8th - hearing intact 9th - palate elevates symmetrically, uvula midline 11th - shoulder shrug symmetric 12th - tongue protrusion midline  MOTOR:  normal bulk and tone, full strength in the BUE, BLE  SENSORY:  normal and symmetric to light touch, pinprick, temperature, vibration  COORDINATION:  finger-nose-finger, fine finger movements normal  REFLEXES:  deep tendon  reflexes present and symmetric     DIAGNOSTIC DATA (LABS, IMAGING, TESTING) - I reviewed patient records, labs, notes, testing and imaging myself where available.  Lab Results  Component Value Date   WBC 7.6 09/03/2021   HGB 9.6 (L) 09/03/2021   HCT 26.2 (L) 09/03/2021   MCV 86.2 09/03/2021   PLT 217 09/03/2021      Component Value Date/Time   NA 137 09/03/2021 1307   K 3.8 09/03/2021 1307   CL 99 09/03/2021 1307   CO2 25 09/03/2021 1307   GLUCOSE 166 (H) 09/03/2021 1307   BUN 76 (H) 09/03/2021 1307   CREATININE 9.37 (H) 09/03/2021 1307   CALCIUM 8.4 (L) 09/03/2021 1307   PROT 5.5 (L) 09/03/2021 1307   ALBUMIN 3.1 (L) 09/03/2021 1307   AST 22 09/03/2021 1307   ALT 33 09/03/2021 1307   ALKPHOS 44 09/03/2021 1307   BILITOT 0.7 09/03/2021 1307   GFRNONAA 4 (L) 09/03/2021 1307   Lab Results  Component Value Date   CHOL 224 (H) 05/15/2021   HDL 35.30 (L) 05/15/2021   LDLDIRECT 77.0 05/15/2021   TRIG (H) 05/15/2021    526.0 Triglyceride is over 400; calculations on Lipids are invalid.   CHOLHDL 6 05/15/2021   Lab Results  Component Value Date   HGBA1C 9.0 (A) 08/15/2021   Lab Results  Component Value Date   VITAMINB12 267 05/15/2021   Lab Results  Component Value Date   TSH 0.23 (L) 05/15/2021      Head CT 09/03/2021 1. No acute intracranial pathology. 2. Mild chronic white matter microangiopathy.  ASSESSMENT AND PLAN  75 y.o. year old female with CKD 4, hypertension, hyperlipidemia, diabetes mellitus who is presenting with seizure-like activity described as feeling weird, lightheadedness, freezing of gait, increase tone, then body going limp and fall.  There was 1 episode associated with bowel incontinence.  It is unclear if these episodes are actual seizures versus catatonia even though there last less than 30 seconds versus period of hypotension.  I will order a routine EEG and MRI brain without contrast.  I will also contact Dr. Hortense Ramal for an expedited EMU admission for capture and characterization of the events.  Follow-up in 3 months.   1. Seizure-like activity ([place])     PLAN: MRI Brain without contrast Routine EEG  EMU admission for capture and characterization Return in 3 months for follow up.   Orders Placed This Encounter  Procedures   MR BRAIN WO CONTRAST   Ambulatory referral to Neurology   EEG adult    No orders of the defined types were placed in this encounter.   Return in about 3 months (around 12/06/2021).    Alric Ran, MD 09/05/2021, 4:03 PM  [facility] [address] [place], [place] [address] [phone]

## 2021-09-06 ENCOUNTER — Telehealth: Payer: Self-pay | Admitting: Neurology

## 2021-09-06 DIAGNOSIS — E875 Hyperkalemia: Secondary | ICD-10-CM | POA: Diagnosis not present

## 2021-09-06 DIAGNOSIS — R82998 Other abnormal findings in urine: Secondary | ICD-10-CM | POA: Diagnosis not present

## 2021-09-06 DIAGNOSIS — D631 Anemia in chronic kidney disease: Secondary | ICD-10-CM | POA: Diagnosis not present

## 2021-09-06 DIAGNOSIS — N2581 Secondary hyperparathyroidism of renal origin: Secondary | ICD-10-CM | POA: Diagnosis not present

## 2021-09-06 DIAGNOSIS — N186 End stage renal disease: Secondary | ICD-10-CM | POA: Diagnosis not present

## 2021-09-06 DIAGNOSIS — Z79899 Other long term (current) drug therapy: Secondary | ICD-10-CM | POA: Diagnosis not present

## 2021-09-06 DIAGNOSIS — D509 Iron deficiency anemia, unspecified: Secondary | ICD-10-CM | POA: Diagnosis not present

## 2021-09-06 DIAGNOSIS — Z992 Dependence on renal dialysis: Secondary | ICD-10-CM | POA: Diagnosis not present

## 2021-09-06 NOTE — Telephone Encounter (Signed)
[employer] medicare order sent to [facility], [facility] they will reach out to the patient to schedule.

## 2021-09-07 DIAGNOSIS — E875 Hyperkalemia: Secondary | ICD-10-CM | POA: Diagnosis not present

## 2021-09-07 DIAGNOSIS — Z992 Dependence on renal dialysis: Secondary | ICD-10-CM | POA: Diagnosis not present

## 2021-09-07 DIAGNOSIS — Z79899 Other long term (current) drug therapy: Secondary | ICD-10-CM | POA: Diagnosis not present

## 2021-09-07 DIAGNOSIS — R82998 Other abnormal findings in urine: Secondary | ICD-10-CM | POA: Diagnosis not present

## 2021-09-07 DIAGNOSIS — N2581 Secondary hyperparathyroidism of renal origin: Secondary | ICD-10-CM | POA: Diagnosis not present

## 2021-09-07 DIAGNOSIS — N186 End stage renal disease: Secondary | ICD-10-CM | POA: Diagnosis not present

## 2021-09-07 DIAGNOSIS — D509 Iron deficiency anemia, unspecified: Secondary | ICD-10-CM | POA: Diagnosis not present

## 2021-09-07 DIAGNOSIS — D631 Anemia in chronic kidney disease: Secondary | ICD-10-CM | POA: Diagnosis not present

## 2021-09-08 DIAGNOSIS — N2581 Secondary hyperparathyroidism of renal origin: Secondary | ICD-10-CM | POA: Diagnosis not present

## 2021-09-08 DIAGNOSIS — N186 End stage renal disease: Secondary | ICD-10-CM | POA: Diagnosis not present

## 2021-09-08 DIAGNOSIS — E875 Hyperkalemia: Secondary | ICD-10-CM | POA: Diagnosis not present

## 2021-09-08 DIAGNOSIS — D631 Anemia in chronic kidney disease: Secondary | ICD-10-CM | POA: Diagnosis not present

## 2021-09-08 DIAGNOSIS — D509 Iron deficiency anemia, unspecified: Secondary | ICD-10-CM | POA: Diagnosis not present

## 2021-09-08 DIAGNOSIS — Z79899 Other long term (current) drug therapy: Secondary | ICD-10-CM | POA: Diagnosis not present

## 2021-09-08 DIAGNOSIS — R82998 Other abnormal findings in urine: Secondary | ICD-10-CM | POA: Diagnosis not present

## 2021-09-08 DIAGNOSIS — Z992 Dependence on renal dialysis: Secondary | ICD-10-CM | POA: Diagnosis not present

## 2021-09-09 DIAGNOSIS — D631 Anemia in chronic kidney disease: Secondary | ICD-10-CM | POA: Diagnosis not present

## 2021-09-09 DIAGNOSIS — E875 Hyperkalemia: Secondary | ICD-10-CM | POA: Diagnosis not present

## 2021-09-09 DIAGNOSIS — N2581 Secondary hyperparathyroidism of renal origin: Secondary | ICD-10-CM | POA: Diagnosis not present

## 2021-09-09 DIAGNOSIS — N186 End stage renal disease: Secondary | ICD-10-CM | POA: Diagnosis not present

## 2021-09-09 DIAGNOSIS — D509 Iron deficiency anemia, unspecified: Secondary | ICD-10-CM | POA: Diagnosis not present

## 2021-09-09 DIAGNOSIS — R82998 Other abnormal findings in urine: Secondary | ICD-10-CM | POA: Diagnosis not present

## 2021-09-09 DIAGNOSIS — Z79899 Other long term (current) drug therapy: Secondary | ICD-10-CM | POA: Diagnosis not present

## 2021-09-09 DIAGNOSIS — Z992 Dependence on renal dialysis: Secondary | ICD-10-CM | POA: Diagnosis not present

## 2021-09-10 DIAGNOSIS — N186 End stage renal disease: Secondary | ICD-10-CM | POA: Diagnosis not present

## 2021-09-10 DIAGNOSIS — E875 Hyperkalemia: Secondary | ICD-10-CM | POA: Diagnosis not present

## 2021-09-10 DIAGNOSIS — D631 Anemia in chronic kidney disease: Secondary | ICD-10-CM | POA: Diagnosis not present

## 2021-09-10 DIAGNOSIS — Z992 Dependence on renal dialysis: Secondary | ICD-10-CM | POA: Diagnosis not present

## 2021-09-10 DIAGNOSIS — N2581 Secondary hyperparathyroidism of renal origin: Secondary | ICD-10-CM | POA: Diagnosis not present

## 2021-09-10 DIAGNOSIS — Z79899 Other long term (current) drug therapy: Secondary | ICD-10-CM | POA: Diagnosis not present

## 2021-09-10 DIAGNOSIS — R82998 Other abnormal findings in urine: Secondary | ICD-10-CM | POA: Diagnosis not present

## 2021-09-10 DIAGNOSIS — D509 Iron deficiency anemia, unspecified: Secondary | ICD-10-CM | POA: Diagnosis not present

## 2021-09-11 DIAGNOSIS — E875 Hyperkalemia: Secondary | ICD-10-CM | POA: Diagnosis not present

## 2021-09-11 DIAGNOSIS — N186 End stage renal disease: Secondary | ICD-10-CM | POA: Diagnosis not present

## 2021-09-11 DIAGNOSIS — Z992 Dependence on renal dialysis: Secondary | ICD-10-CM | POA: Diagnosis not present

## 2021-09-11 DIAGNOSIS — D631 Anemia in chronic kidney disease: Secondary | ICD-10-CM | POA: Diagnosis not present

## 2021-09-11 DIAGNOSIS — R82998 Other abnormal findings in urine: Secondary | ICD-10-CM | POA: Diagnosis not present

## 2021-09-11 DIAGNOSIS — N2581 Secondary hyperparathyroidism of renal origin: Secondary | ICD-10-CM | POA: Diagnosis not present

## 2021-09-11 DIAGNOSIS — Z79899 Other long term (current) drug therapy: Secondary | ICD-10-CM | POA: Diagnosis not present

## 2021-09-11 DIAGNOSIS — D509 Iron deficiency anemia, unspecified: Secondary | ICD-10-CM | POA: Diagnosis not present

## 2021-09-12 DIAGNOSIS — D509 Iron deficiency anemia, unspecified: Secondary | ICD-10-CM | POA: Diagnosis not present

## 2021-09-12 DIAGNOSIS — E875 Hyperkalemia: Secondary | ICD-10-CM | POA: Diagnosis not present

## 2021-09-12 DIAGNOSIS — N2581 Secondary hyperparathyroidism of renal origin: Secondary | ICD-10-CM | POA: Diagnosis not present

## 2021-09-12 DIAGNOSIS — R82998 Other abnormal findings in urine: Secondary | ICD-10-CM | POA: Diagnosis not present

## 2021-09-12 DIAGNOSIS — Z79899 Other long term (current) drug therapy: Secondary | ICD-10-CM | POA: Diagnosis not present

## 2021-09-12 DIAGNOSIS — Z992 Dependence on renal dialysis: Secondary | ICD-10-CM | POA: Diagnosis not present

## 2021-09-12 DIAGNOSIS — N186 End stage renal disease: Secondary | ICD-10-CM | POA: Diagnosis not present

## 2021-09-12 DIAGNOSIS — D631 Anemia in chronic kidney disease: Secondary | ICD-10-CM | POA: Diagnosis not present

## 2021-09-13 ENCOUNTER — Telehealth: Payer: Self-pay | Admitting: Neurology

## 2021-09-13 DIAGNOSIS — N186 End stage renal disease: Secondary | ICD-10-CM | POA: Diagnosis not present

## 2021-09-13 DIAGNOSIS — D631 Anemia in chronic kidney disease: Secondary | ICD-10-CM | POA: Diagnosis not present

## 2021-09-13 DIAGNOSIS — Z992 Dependence on renal dialysis: Secondary | ICD-10-CM | POA: Diagnosis not present

## 2021-09-13 DIAGNOSIS — Z79899 Other long term (current) drug therapy: Secondary | ICD-10-CM | POA: Diagnosis not present

## 2021-09-13 DIAGNOSIS — R82998 Other abnormal findings in urine: Secondary | ICD-10-CM | POA: Diagnosis not present

## 2021-09-13 DIAGNOSIS — N2581 Secondary hyperparathyroidism of renal origin: Secondary | ICD-10-CM | POA: Diagnosis not present

## 2021-09-13 DIAGNOSIS — D509 Iron deficiency anemia, unspecified: Secondary | ICD-10-CM | POA: Diagnosis not present

## 2021-09-13 DIAGNOSIS — E875 Hyperkalemia: Secondary | ICD-10-CM | POA: Diagnosis not present

## 2021-09-13 NOTE — Telephone Encounter (Signed)
Pt's daughter states pt was advised to inform Dr April Manson of the next time pt had another episode. Daughter has called to report a recent episode, please call.

## 2021-09-13 NOTE — Telephone Encounter (Signed)
Fyi for Dr. April Manson.

## 2021-09-14 ENCOUNTER — Ambulatory Visit
Admission: RE | Admit: 2021-09-14 | Discharge: 2021-09-14 | Disposition: A | Discharge status: Home / Self Care | Payer: Medicare Other | Source: Ambulatory Visit | Attending: Neurology | Admitting: Neurology

## 2021-09-14 ENCOUNTER — Other Ambulatory Visit: Payer: Self-pay

## 2021-09-14 DIAGNOSIS — R569 Unspecified convulsions: Secondary | ICD-10-CM | POA: Diagnosis not present

## 2021-09-14 DIAGNOSIS — N186 End stage renal disease: Secondary | ICD-10-CM | POA: Diagnosis not present

## 2021-09-14 DIAGNOSIS — R82998 Other abnormal findings in urine: Secondary | ICD-10-CM | POA: Diagnosis not present

## 2021-09-14 DIAGNOSIS — N2581 Secondary hyperparathyroidism of renal origin: Secondary | ICD-10-CM | POA: Diagnosis not present

## 2021-09-14 DIAGNOSIS — D509 Iron deficiency anemia, unspecified: Secondary | ICD-10-CM | POA: Diagnosis not present

## 2021-09-14 DIAGNOSIS — Z79899 Other long term (current) drug therapy: Secondary | ICD-10-CM | POA: Diagnosis not present

## 2021-09-14 DIAGNOSIS — E875 Hyperkalemia: Secondary | ICD-10-CM | POA: Diagnosis not present

## 2021-09-14 DIAGNOSIS — D631 Anemia in chronic kidney disease: Secondary | ICD-10-CM | POA: Diagnosis not present

## 2021-09-14 DIAGNOSIS — Z992 Dependence on renal dialysis: Secondary | ICD-10-CM | POA: Diagnosis not present

## 2021-09-15 DIAGNOSIS — N2581 Secondary hyperparathyroidism of renal origin: Secondary | ICD-10-CM | POA: Diagnosis not present

## 2021-09-15 DIAGNOSIS — D509 Iron deficiency anemia, unspecified: Secondary | ICD-10-CM | POA: Diagnosis not present

## 2021-09-15 DIAGNOSIS — Z79899 Other long term (current) drug therapy: Secondary | ICD-10-CM | POA: Diagnosis not present

## 2021-09-15 DIAGNOSIS — R82998 Other abnormal findings in urine: Secondary | ICD-10-CM | POA: Diagnosis not present

## 2021-09-15 DIAGNOSIS — Z992 Dependence on renal dialysis: Secondary | ICD-10-CM | POA: Diagnosis not present

## 2021-09-15 DIAGNOSIS — D631 Anemia in chronic kidney disease: Secondary | ICD-10-CM | POA: Diagnosis not present

## 2021-09-15 DIAGNOSIS — E875 Hyperkalemia: Secondary | ICD-10-CM | POA: Diagnosis not present

## 2021-09-15 DIAGNOSIS — N186 End stage renal disease: Secondary | ICD-10-CM | POA: Diagnosis not present

## 2021-09-16 DIAGNOSIS — D509 Iron deficiency anemia, unspecified: Secondary | ICD-10-CM | POA: Diagnosis not present

## 2021-09-16 DIAGNOSIS — Z992 Dependence on renal dialysis: Secondary | ICD-10-CM | POA: Diagnosis not present

## 2021-09-16 DIAGNOSIS — Z79899 Other long term (current) drug therapy: Secondary | ICD-10-CM | POA: Diagnosis not present

## 2021-09-16 DIAGNOSIS — N186 End stage renal disease: Secondary | ICD-10-CM | POA: Diagnosis not present

## 2021-09-16 DIAGNOSIS — N2581 Secondary hyperparathyroidism of renal origin: Secondary | ICD-10-CM | POA: Diagnosis not present

## 2021-09-16 DIAGNOSIS — D631 Anemia in chronic kidney disease: Secondary | ICD-10-CM | POA: Diagnosis not present

## 2021-09-16 DIAGNOSIS — E875 Hyperkalemia: Secondary | ICD-10-CM | POA: Diagnosis not present

## 2021-09-16 DIAGNOSIS — R82998 Other abnormal findings in urine: Secondary | ICD-10-CM | POA: Diagnosis not present

## 2021-09-17 DIAGNOSIS — Z79899 Other long term (current) drug therapy: Secondary | ICD-10-CM | POA: Diagnosis not present

## 2021-09-17 DIAGNOSIS — R82998 Other abnormal findings in urine: Secondary | ICD-10-CM | POA: Diagnosis not present

## 2021-09-17 DIAGNOSIS — D631 Anemia in chronic kidney disease: Secondary | ICD-10-CM | POA: Diagnosis not present

## 2021-09-17 DIAGNOSIS — N2581 Secondary hyperparathyroidism of renal origin: Secondary | ICD-10-CM | POA: Diagnosis not present

## 2021-09-17 DIAGNOSIS — Z992 Dependence on renal dialysis: Secondary | ICD-10-CM | POA: Diagnosis not present

## 2021-09-17 DIAGNOSIS — N186 End stage renal disease: Secondary | ICD-10-CM | POA: Diagnosis not present

## 2021-09-17 DIAGNOSIS — E875 Hyperkalemia: Secondary | ICD-10-CM | POA: Diagnosis not present

## 2021-09-17 DIAGNOSIS — D509 Iron deficiency anemia, unspecified: Secondary | ICD-10-CM | POA: Diagnosis not present

## 2021-09-18 ENCOUNTER — Telehealth: Payer: Self-pay | Admitting: Neurology

## 2021-09-18 DIAGNOSIS — I639 Cerebral infarction, unspecified: Secondary | ICD-10-CM

## 2021-09-18 DIAGNOSIS — D631 Anemia in chronic kidney disease: Secondary | ICD-10-CM | POA: Diagnosis not present

## 2021-09-18 DIAGNOSIS — Z992 Dependence on renal dialysis: Secondary | ICD-10-CM | POA: Diagnosis not present

## 2021-09-18 DIAGNOSIS — N2581 Secondary hyperparathyroidism of renal origin: Secondary | ICD-10-CM | POA: Diagnosis not present

## 2021-09-18 DIAGNOSIS — E875 Hyperkalemia: Secondary | ICD-10-CM | POA: Diagnosis not present

## 2021-09-18 DIAGNOSIS — Z79899 Other long term (current) drug therapy: Secondary | ICD-10-CM | POA: Diagnosis not present

## 2021-09-18 DIAGNOSIS — D509 Iron deficiency anemia, unspecified: Secondary | ICD-10-CM | POA: Diagnosis not present

## 2021-09-18 DIAGNOSIS — R82998 Other abnormal findings in urine: Secondary | ICD-10-CM | POA: Diagnosis not present

## 2021-09-18 DIAGNOSIS — N186 End stage renal disease: Secondary | ICD-10-CM | POA: Diagnosis not present

## 2021-09-18 NOTE — Telephone Encounter (Signed)
[employer] medicare no auth require. Sent a message to [place] at [facility] they will reach out to the patient as soon as possible to schedule.

## 2021-09-18 NOTE — Telephone Encounter (Signed)
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  They are currently in [place] and will return to [facility] Friday afternoon.  Denies any recent fall or any new symptoms.  Advised patient to start today aspirin 81 mg daily and to go to the nearest hospital if she has any new symptoms, any new weakness any new fall or any strokelike symptoms. Will order lipid panel, hemoglobin A1c, CT angiogram head and neck and echocardiogram and cardiac monitor.   Alric Ran, MD

## 2021-09-19 DIAGNOSIS — N2581 Secondary hyperparathyroidism of renal origin: Secondary | ICD-10-CM | POA: Diagnosis not present

## 2021-09-19 DIAGNOSIS — D509 Iron deficiency anemia, unspecified: Secondary | ICD-10-CM | POA: Diagnosis not present

## 2021-09-19 DIAGNOSIS — R82998 Other abnormal findings in urine: Secondary | ICD-10-CM | POA: Diagnosis not present

## 2021-09-19 DIAGNOSIS — Z992 Dependence on renal dialysis: Secondary | ICD-10-CM | POA: Diagnosis not present

## 2021-09-19 DIAGNOSIS — Z79899 Other long term (current) drug therapy: Secondary | ICD-10-CM | POA: Diagnosis not present

## 2021-09-19 DIAGNOSIS — E875 Hyperkalemia: Secondary | ICD-10-CM | POA: Diagnosis not present

## 2021-09-19 DIAGNOSIS — N186 End stage renal disease: Secondary | ICD-10-CM | POA: Diagnosis not present

## 2021-09-19 DIAGNOSIS — D631 Anemia in chronic kidney disease: Secondary | ICD-10-CM | POA: Diagnosis not present

## 2021-09-19 NOTE — Telephone Encounter (Signed)
Patient is scheduled for 09/24/21.

## 2021-09-20 DIAGNOSIS — Z79899 Other long term (current) drug therapy: Secondary | ICD-10-CM | POA: Diagnosis not present

## 2021-09-20 DIAGNOSIS — E875 Hyperkalemia: Secondary | ICD-10-CM | POA: Diagnosis not present

## 2021-09-20 DIAGNOSIS — Z992 Dependence on renal dialysis: Secondary | ICD-10-CM | POA: Diagnosis not present

## 2021-09-20 DIAGNOSIS — D631 Anemia in chronic kidney disease: Secondary | ICD-10-CM | POA: Diagnosis not present

## 2021-09-20 DIAGNOSIS — R82998 Other abnormal findings in urine: Secondary | ICD-10-CM | POA: Diagnosis not present

## 2021-09-20 DIAGNOSIS — N2581 Secondary hyperparathyroidism of renal origin: Secondary | ICD-10-CM | POA: Diagnosis not present

## 2021-09-20 DIAGNOSIS — N186 End stage renal disease: Secondary | ICD-10-CM | POA: Diagnosis not present

## 2021-09-20 DIAGNOSIS — D509 Iron deficiency anemia, unspecified: Secondary | ICD-10-CM | POA: Diagnosis not present

## 2021-09-21 DIAGNOSIS — E875 Hyperkalemia: Secondary | ICD-10-CM | POA: Diagnosis not present

## 2021-09-21 DIAGNOSIS — Z992 Dependence on renal dialysis: Secondary | ICD-10-CM | POA: Diagnosis not present

## 2021-09-21 DIAGNOSIS — N2581 Secondary hyperparathyroidism of renal origin: Secondary | ICD-10-CM | POA: Diagnosis not present

## 2021-09-21 DIAGNOSIS — R82998 Other abnormal findings in urine: Secondary | ICD-10-CM | POA: Diagnosis not present

## 2021-09-21 DIAGNOSIS — D631 Anemia in chronic kidney disease: Secondary | ICD-10-CM | POA: Diagnosis not present

## 2021-09-21 DIAGNOSIS — Z79899 Other long term (current) drug therapy: Secondary | ICD-10-CM | POA: Diagnosis not present

## 2021-09-21 DIAGNOSIS — N186 End stage renal disease: Secondary | ICD-10-CM | POA: Diagnosis not present

## 2021-09-21 DIAGNOSIS — D509 Iron deficiency anemia, unspecified: Secondary | ICD-10-CM | POA: Diagnosis not present

## 2021-09-22 DIAGNOSIS — Z79899 Other long term (current) drug therapy: Secondary | ICD-10-CM | POA: Diagnosis not present

## 2021-09-22 DIAGNOSIS — D509 Iron deficiency anemia, unspecified: Secondary | ICD-10-CM | POA: Diagnosis not present

## 2021-09-22 DIAGNOSIS — D631 Anemia in chronic kidney disease: Secondary | ICD-10-CM | POA: Diagnosis not present

## 2021-09-22 DIAGNOSIS — Z992 Dependence on renal dialysis: Secondary | ICD-10-CM | POA: Diagnosis not present

## 2021-09-22 DIAGNOSIS — R82998 Other abnormal findings in urine: Secondary | ICD-10-CM | POA: Diagnosis not present

## 2021-09-22 DIAGNOSIS — E875 Hyperkalemia: Secondary | ICD-10-CM | POA: Diagnosis not present

## 2021-09-22 DIAGNOSIS — N186 End stage renal disease: Secondary | ICD-10-CM | POA: Diagnosis not present

## 2021-09-22 DIAGNOSIS — N2581 Secondary hyperparathyroidism of renal origin: Secondary | ICD-10-CM | POA: Diagnosis not present

## 2021-09-23 DIAGNOSIS — D509 Iron deficiency anemia, unspecified: Secondary | ICD-10-CM | POA: Diagnosis not present

## 2021-09-23 DIAGNOSIS — Z79899 Other long term (current) drug therapy: Secondary | ICD-10-CM | POA: Diagnosis not present

## 2021-09-23 DIAGNOSIS — E875 Hyperkalemia: Secondary | ICD-10-CM | POA: Diagnosis not present

## 2021-09-23 DIAGNOSIS — N186 End stage renal disease: Secondary | ICD-10-CM | POA: Diagnosis not present

## 2021-09-23 DIAGNOSIS — D631 Anemia in chronic kidney disease: Secondary | ICD-10-CM | POA: Diagnosis not present

## 2021-09-23 DIAGNOSIS — N2581 Secondary hyperparathyroidism of renal origin: Secondary | ICD-10-CM | POA: Diagnosis not present

## 2021-09-23 DIAGNOSIS — Z992 Dependence on renal dialysis: Secondary | ICD-10-CM | POA: Diagnosis not present

## 2021-09-23 DIAGNOSIS — R82998 Other abnormal findings in urine: Secondary | ICD-10-CM | POA: Diagnosis not present

## 2021-09-24 ENCOUNTER — Ambulatory Visit
Admission: RE | Admit: 2021-09-24 | Discharge: 2021-09-24 | Disposition: A | Discharge status: Home / Self Care | Payer: Medicare Other | Source: Ambulatory Visit | Attending: Neurology | Admitting: Neurology

## 2021-09-24 ENCOUNTER — Encounter: Payer: Self-pay | Admitting: Neurology

## 2021-09-24 ENCOUNTER — Other Ambulatory Visit: Payer: Medicare Other

## 2021-09-24 ENCOUNTER — Ambulatory Visit: Payer: Medicare Other | Admitting: Neurology

## 2021-09-24 VITALS — Ht 67.0 in | Wt 180.5 lb

## 2021-09-24 DIAGNOSIS — E875 Hyperkalemia: Secondary | ICD-10-CM | POA: Diagnosis not present

## 2021-09-24 DIAGNOSIS — J329 Chronic sinusitis, unspecified: Secondary | ICD-10-CM | POA: Diagnosis not present

## 2021-09-24 DIAGNOSIS — N2581 Secondary hyperparathyroidism of renal origin: Secondary | ICD-10-CM | POA: Diagnosis not present

## 2021-09-24 DIAGNOSIS — I6611 Occlusion and stenosis of right anterior cerebral artery: Secondary | ICD-10-CM | POA: Diagnosis not present

## 2021-09-24 DIAGNOSIS — I639 Cerebral infarction, unspecified: Secondary | ICD-10-CM

## 2021-09-24 DIAGNOSIS — Z79899 Other long term (current) drug therapy: Secondary | ICD-10-CM | POA: Diagnosis not present

## 2021-09-24 DIAGNOSIS — Q283 Other malformations of cerebral vessels: Secondary | ICD-10-CM | POA: Diagnosis not present

## 2021-09-24 DIAGNOSIS — G4733 Obstructive sleep apnea (adult) (pediatric): Secondary | ICD-10-CM

## 2021-09-24 DIAGNOSIS — D509 Iron deficiency anemia, unspecified: Secondary | ICD-10-CM | POA: Diagnosis not present

## 2021-09-24 DIAGNOSIS — D631 Anemia in chronic kidney disease: Secondary | ICD-10-CM | POA: Diagnosis not present

## 2021-09-24 DIAGNOSIS — I6601 Occlusion and stenosis of right middle cerebral artery: Secondary | ICD-10-CM | POA: Diagnosis not present

## 2021-09-24 DIAGNOSIS — R82998 Other abnormal findings in urine: Secondary | ICD-10-CM | POA: Diagnosis not present

## 2021-09-24 DIAGNOSIS — Z992 Dependence on renal dialysis: Secondary | ICD-10-CM | POA: Diagnosis not present

## 2021-09-24 DIAGNOSIS — R569 Unspecified convulsions: Secondary | ICD-10-CM | POA: Diagnosis not present

## 2021-09-24 DIAGNOSIS — N186 End stage renal disease: Secondary | ICD-10-CM

## 2021-09-24 MED ORDER — IOPAMIDOL (ISOVUE-370) INJECTION 76%
75.0000 mL | Freq: Once | INTRAVENOUS | Status: AC | PRN
  Administered 2021-09-24: 75 mL via INTRAVENOUS

## 2021-09-24 NOTE — Progress Notes (Signed)
CM sent to [facility] 

## 2021-09-24 NOTE — Patient Instructions (Signed)
Sleep Apnea Sleep apnea affects breathing during sleep. It causes breathing to stop for 10 seconds or more, or to become shallow. People with sleep apnea usually snoreloudly. It can also increase the risk of: Heart attack. Stroke. Being very overweight (obese). Diabetes. Heart failure. Irregular heartbeat. High blood pressure. The goal of treatment is to help you breathe normally again. What are the causes?  The most common cause of this condition is a collapsed or blocked airway. There are three kinds of sleep apnea: Obstructive sleep apnea. This is caused by a blocked or collapsed airway. Central sleep apnea. This happens when the brain does not send the right signals to the muscles that control breathing. Mixed sleep apnea. This is a combination of obstructive and central sleep apnea. What increases the risk? Being overweight. Smoking. Having a small airway. Being older. Being female. Drinking alcohol. Taking medicines to calm yourself (sedatives or tranquilizers). Having family members with the condition. Having a tongue or tonsils that are larger than normal. What are the signs or symptoms? Trouble staying asleep. Loud snoring. Headaches in the morning. Waking up gasping. Dry mouth or sore throat in the morning. Being sleepy or tired during the day. If you are sleepy or tired during the day, you may also: Not be able to focus your mind (concentrate). Forget things. Get angry a lot and have mood swings. Feel sad (depressed). Have changes in your personality. Have less interest in sex, if you are female. Be unable to have an erection, if you are female. How is this treated?  Sleeping on your side. Using a medicine to get rid of mucus in your nose (decongestant). Avoiding the use of alcohol, medicines to help you relax, or certain pain medicines (narcotics). Losing weight, if needed. Changing your diet. Quitting smoking. Using a machine to open your airway while you  sleep, such as: An oral appliance. This is a mouthpiece that shifts your lower jaw forward. A CPAP device. This device blows air through a mask when you breathe out (exhale). An EPAP device. This has valves that you put in each nostril. A BPAP device. This device blows air through a mask when you breathe in (inhale) and breathe out. Having surgery if other treatments do not work. Follow these instructions at home: Lifestyle Make changes that your doctor recommends. Eat a healthy diet. Lose weight if needed. Avoid alcohol, medicines to help you relax, and some pain medicines. Do not smoke or use any products that contain nicotine or tobacco. If you need help quitting, ask your doctor. General instructions Take over-the-counter and prescription medicines only as told by your doctor. If you were given a machine to use while you sleep, use it only as told by your doctor. If you are having surgery, make sure to tell your doctor you have sleep apnea. You may need to bring your device with you. Keep all follow-up visits. Contact a doctor if: The machine that you were given to use during sleep bothers you or does not seem to be working. You do not get better. You get worse. Get help right away if: Your chest hurts. You have trouble breathing in enough air. You have an uncomfortable feeling in your back, arms, or stomach. You have trouble talking. One side of your body feels weak. A part of your face is hanging down. These symptoms may be an emergency. Get help right away. Call your local emergency services (911 in the [place] Do not wait to see if the symptoms   will go away. Do not drive yourself to the hospital. Summary This condition affects breathing during sleep. The most common cause is a collapsed or blocked airway. The goal of treatment is to help you breathe normally while you sleep. This information is not intended to replace advice given to you by your health care provider. Make  sure you discuss any questions you have with your healthcare provider. Document Revised: 10/27/2020 Document Reviewed: 10/27/2020 Elsevier Patient Education  2022 Elsevier Inc.  

## 2021-09-24 NOTE — Progress Notes (Signed)
SLEEP MEDICINE CLINIC    Provider:  Larey Seat, MD  Primary Care Physician:  Isaac Bliss, Rayford Halsted, MD [place] [address]     Referring Provider: Isaac Bliss, Rayford Halsted, [place],  [place] [id number]          Chief Complaint according to patient   Patient presents with:     New Patient (Initial Visit)           HISTORY OF PRESENT ILLNESS:  Felicia Thompson is a 75 y.o. Caucasian female patient of Dr Jabier Mutton seen here as a referral on 09/24/2021  for a transfer of CPAP/ OSA care..  Chief concern according to patient : I was never treated for apnea. Rm 10, alone. Internal referral for OSA. Pt dx with OSA in [place] just 5 months ago 5/022.  Felicia Thompson reports she had undergone an attended sleep study in [place], she was woken up after her AHI exceeded 20 and first placed on a FFM which she did dislike. Then started with a nasal mask and chinstrap, but no follow up it was performed the last day of her stay in [place]. She is widowed and moved to [place] to be close to two adult children.      Felicia Thompson  has a past medical history of Arthritis, Blood in stool, Chronic kidney disease, Depression, DM (diabetes mellitus), type 2 with complications ([place]), ESRD on peritoneal dialysis ([place]), History of fainting spells of unknown cause, Hyperlipidemia, Hyperlipidemia associated with type 2 diabetes mellitus ([place]), Hypertension, and OSA (obstructive sleep apnea).. \  She has just very recently seen Dr April Manson, 09-13-2021, who initiated her transfer of sleep care. See his note here:   75 y.o. year old female with CKD 4, hypertension, hyperlipidemia, diabetes mellitus who is presenting with seizure-like activity described as feeling weird, lightheadedness, freezing of gait, increase tone, then body going limp and fall.  There was 1 episode associated with bowel incontinence.  It is unclear if these episodes are actual  seizures versus catatonia even though there last less than 30 seconds versus period of hypotension.  I will order a routine EEG and MRI brain without contrast.  I will also contact Dr. Hortense Ramal for an expedited EMU admission for capture and characterization of the events.  Follow-up in 3 months.    The patient had the first sleep study in [place] earlier this year , in May. Since February , she had falls and spells, and her children initiated her move here.    Sleep relevant medical history: no Nocturia, peritoneal dialysis, [facility]. Had tonsillectomy. hysterectomy, cholecystectomy.  Renal disease due to NSAIDS overuse over 3 decades.   Family medical /sleep history: no other family member on CPAP with OSA, insomnia, sleep walkers.    Social history:  Patient is retired from  Systems analyst- and lives in a household with 4 persons. Family status is widowed , with adult children, lives with daughters family now.  2 grandchildren.  Tobacco use never .  ETOH use never ,  Caffeine intake in form of Coffee( 1 cup AM) Soda( sometimes) Tea ( dinner ) or energy drinks.     Sleep habits are as follows: The patient's dinner time is between 5-7 PM. The patient goes to bed at 10 PM and continues to use the peritoneal dialysis with a " cycler" 4 times during sleep . The preferred sleep position is laterally/  , with the support  of 2 pillows. Dreams are reportedly frequent/vivid.  11  AM is the usual rise time. The patient wakes up 8.30 with an alarm. She can't get up. He/ She reports not feeling refreshed or restored in AM, with symptoms such as dry mouth, morning residual fatigue. Naps are taken infrequently, lasting from 3-4 hours  and are more less refreshing than nocturnal sleep.    Review of Systems: Out of a complete 14 system review, the patient complains of only the following symptoms, and all other reviewed systems are negative.:  Fatigue, sleepiness fragmented sleep, hypersomnia.   How  likely are you to doze in the following situations: 0 = not likely, 1 = slight chance, 2 = moderate chance, 3 = high chance   Sitting and Reading? Watching Television? Sitting inactive in a public place (theater or meeting)? As a passenger in a car for an hour without a break? Lying down in the afternoon when circumstances permit? Sitting and talking to someone? Sitting quietly after lunch without alcohol? In a car, while stopped for a few minutes in traffic?   Total = 10/ 24 points   FSS endorsed at 47/ 63 points.   Social History   Socioeconomic History   Marital status: Widowed    Spouse name: Not on file   Number of children: Not on file   Years of education: Not on file   Highest education level: Not on file  Occupational History   Not on file  Tobacco Use   Smoking status: Never   Smokeless tobacco: Never  Substance and Sexual Activity   Alcohol use: Never   Drug use: Never   Sexual activity: Not Currently  Other Topics Concern   Not on file  Social History Narrative   Not on file   Social Determinants of Health   Financial Resource Strain: Not on file  Food Insecurity: Not on file  Transportation Needs: Not on file  Physical Activity: Not on file  Stress: Not on file  Social Connections: Not on file    Family History  Problem Relation Age of Onset   Hearing loss Father    Hyperlipidemia Father    Heart disease Father    Early death Father    Cancer Father    Early death Brother    Drug abuse Brother    Diabetes Brother    Depression Brother    Alcohol abuse Brother    Miscarriages / [place] Daughter    Arthritis Daughter    Depression Daughter    Early death Maternal Grandmother    Heart attack Maternal Grandmother    Heart disease Maternal Grandmother    Hearing loss Paternal Grandmother    Heart disease Paternal Grandmother    Heart disease Paternal Grandfather    Early death Paternal Grandfather    Heart attack Paternal Grandfather      Past Medical History:  Diagnosis Date   Arthritis    Blood in stool    when appendix burst   Chronic kidney disease    Depression    DM (diabetes mellitus), type 2 with complications ([place])    ESRD on peritoneal dialysis ([place])    History of fainting spells of unknown cause    Hyperlipidemia    Hyperlipidemia associated with type 2 diabetes mellitus ([place])    Hypertension    OSA (obstructive sleep apnea)     Past Surgical History:  Procedure Laterality Date   ABDOMINAL HYSTERECTOMY     APPENDECTOMY  CHOLECYSTECTOMY     TONSILECTOMY, ADENOIDECTOMY, BILATERAL MYRINGOTOMY AND TUBES       Current Outpatient Medications on File Prior to Visit  Medication Sig Dispense Refill   atorvastatin (LIPITOR) 80 MG tablet Take 1 tablet (80 mg total) by mouth daily. 90 tablet 1   calcium carbonate (TUMS EX) 750 MG chewable tablet Chew 1 tablet by mouth 2 (two) times daily.     carvedilol (COREG) 25 MG tablet Take 25 mg by mouth 2 (two) times daily with a meal.     Continuous Blood Gluc Sensor (FREESTYLE LIBRE 2 SENSOR) MISC 1 Device by Does not apply route every 14 (fourteen) days. 6 each 3   cyclobenzaprine (FLEXERIL) 5 MG tablet Take 5 mg by mouth 3 (three) times daily.     hydrALAZINE (APRESOLINE) 50 MG tablet Take 50 mg by mouth 3 (three) times daily.     insulin NPH-regular Human (HUMULIN 70/30) (70-30) 100 UNIT/ML injection Inject 10 Units into the skin daily with breakfast. 30 mL 11   losartan (COZAAR) 25 MG tablet Take 25 mg by mouth 2 (two) times daily.     Probiotic Product ([employer] ADV PROBIOTIC GUMMIES PO) Take by mouth.     No current facility-administered medications on file prior to visit.    Allergies  Allergen Reactions   Tape Other (See Comments)    Blisters   Tetracyclines & Related Rash    Physical exam:  There were no vitals filed for this visit. There is no height or weight on file to calculate BMI.   Wt Readings from Last 3 Encounters:  09/05/21 172 lb (78  kg)  09/03/21 173 lb (78.5 kg)  08/15/21 179 lb 9.6 oz (81.5 kg)     Ht Readings from Last 3 Encounters:  09/05/21 5\' 7"  (1.702 m)  09/03/21 5\' 7"  (1.702 m)  08/15/21 5\' 7"  (1.702 m)      General: The patient is awake, alert and appears not in acute distress. The patient is well groomed. Head: Normocephalic, atraumatic. Neck is supple. Mallampati 3,  neck circumference:16 inches . Nasal airflow  patent.  Retrognathia is not seen.  Dental status:  Cardiovascular:  Regular rate and cardiac rhythm by pulse,  without distended neck veins. Respiratory: Lungs are clear to auscultation.  Skin:  Without evidence of ankle edema, or rash. Pale.  Trunk: The patient's posture is erect.   Neurologic exam : The patient is awake and alert, oriented to place and time.   Memory subjective described as intact.  Attention span & concentration ability appears normal.  Speech is fluent,  without  dysarthria, dysphonia or aphasia.  Mood and affect are appropriate.   Cranial nerves: no loss of smell or taste reported  Pupils are equal and briskly reactive to light. Funduscopic exam deferred. .  Extraocular movements in vertical and horizontal planes were intact and without nystagmus. No Diplopia. Visual fields by finger perimetry are intact. Hearing was intact to soft voice and finger rubbing.    Facial sensation intact to fine touch.  Facial motor strength is symmetric and tongue and uvula move midline.  Neck ROM : rotation, tilt and flexion extension were normal for age and shoulder shrug was symmetrical.     I received the sleep study results from Apr 17, 2021 signed by Dr. Sedalia Muta, MD.  The patient had a latency to sleep of 48 minutes, REM latency was 47.5 minutes.  N3 sleep was 20 minutes.  Total time in REM sleep  was 30 minutes.  The total AHI was 44.7/h this consists of apneas and hypopneas cessation of breathing and shallow breathing.  Oxygen data below 88% was 0 minutes.  Sinus  tachycardia was intermittently observed no arrhythmia.  The patient and was split by AASM protocol and CPAP was initiated and finally titrated from 5-14 cmH2O for the residual AHI was 1.8.  The patient reports that she was using a nasal pillow that a chinstrap had been added after she could not tolerate a full facemask.  It was coated in her sleep study showed that she used a DreamWear nasal cushion.  It was later changed to full facemask and states here but they went back to the DreamWear cushion with a chinstrap.  So what I need for this patient is basically starting her on CPAP she will have an autotitrator with a pressure setting between 5 and 15 cmH2O 1 cm EPR and we are going to follow-up within the next 3 months to see if her compliance data are satisfying.  In patients with chronic kidney disease there is a higher risk of central apnea.  This condition indicates that the brain is not paced to breathe and usually this lack of response is due to blunted receptor chemoreceptor response.  It can occur when traveling to high altitude it can occur in liver and kidney failure.    After spending a total time of  45  minutes face to face and additional time for physical and neurologic examination, review of laboratory studies,  personal review of imaging studies, reports and results of other testing and review of referral information / records as far as provided in visit, I have established the following assessments:  1) She was properly tested and diagnosed with severe OSA.  2)  she was titrated  14 cm water.  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 P dialyisis will affect hypopneas and thoracic expansion.  2) Rv within 90 days after therapy started.     I would like to thank Isaac Bliss, Rayford Halsted, MD and Isaac Bliss, Rayford Halsted, [place],  [place] [id number] for  allowing me to meet with and to take care of this pleasant patient.    Follow up either personally or through our NP within 3 months after onset of therapy.    CC: I will share my notes with PCP. Marland Kitchen  Electronically signed by: Larey Seat, MD 09/24/2021 2:49 PM  [facility] Board certified by The [employer] of Sleep Medicine and Diplomate of the [employer] of Sleep Medicine. Board certified In Neurology through the [place], Fellow of the [employer] of Neurology. Medical Director of [facility].

## 2021-09-25 ENCOUNTER — Ambulatory Visit: Payer: Medicare Other | Admitting: Neurology

## 2021-09-25 DIAGNOSIS — D631 Anemia in chronic kidney disease: Secondary | ICD-10-CM | POA: Diagnosis not present

## 2021-09-25 DIAGNOSIS — N2581 Secondary hyperparathyroidism of renal origin: Secondary | ICD-10-CM | POA: Diagnosis not present

## 2021-09-25 DIAGNOSIS — E875 Hyperkalemia: Secondary | ICD-10-CM | POA: Diagnosis not present

## 2021-09-25 DIAGNOSIS — R569 Unspecified convulsions: Secondary | ICD-10-CM

## 2021-09-25 DIAGNOSIS — R82998 Other abnormal findings in urine: Secondary | ICD-10-CM | POA: Diagnosis not present

## 2021-09-25 DIAGNOSIS — Z79899 Other long term (current) drug therapy: Secondary | ICD-10-CM | POA: Diagnosis not present

## 2021-09-25 DIAGNOSIS — N186 End stage renal disease: Secondary | ICD-10-CM | POA: Diagnosis not present

## 2021-09-25 DIAGNOSIS — Z992 Dependence on renal dialysis: Secondary | ICD-10-CM | POA: Diagnosis not present

## 2021-09-25 DIAGNOSIS — D509 Iron deficiency anemia, unspecified: Secondary | ICD-10-CM | POA: Diagnosis not present

## 2021-09-26 DIAGNOSIS — Z992 Dependence on renal dialysis: Secondary | ICD-10-CM | POA: Diagnosis not present

## 2021-09-26 DIAGNOSIS — D509 Iron deficiency anemia, unspecified: Secondary | ICD-10-CM | POA: Diagnosis not present

## 2021-09-26 DIAGNOSIS — R82998 Other abnormal findings in urine: Secondary | ICD-10-CM | POA: Diagnosis not present

## 2021-09-26 DIAGNOSIS — D631 Anemia in chronic kidney disease: Secondary | ICD-10-CM | POA: Diagnosis not present

## 2021-09-26 DIAGNOSIS — E875 Hyperkalemia: Secondary | ICD-10-CM | POA: Diagnosis not present

## 2021-09-26 DIAGNOSIS — N186 End stage renal disease: Secondary | ICD-10-CM | POA: Diagnosis not present

## 2021-09-26 DIAGNOSIS — Z79899 Other long term (current) drug therapy: Secondary | ICD-10-CM | POA: Diagnosis not present

## 2021-09-26 DIAGNOSIS — N2581 Secondary hyperparathyroidism of renal origin: Secondary | ICD-10-CM | POA: Diagnosis not present

## 2021-09-26 NOTE — Procedures (Signed)
    History:  75 year old woman with episode of staring, gait freezing and falls.   EEG classification: Awake and drowsy  Description of the recording: The background rhythms of this recording consists of a fairly well modulated medium amplitude theta rhythm of 5-7 Hz that is reactive to eye opening and closure. As the record progresses, the patient appears to remain in the waking state throughout the recording. Photic stimulation was performed, did not show any abnormalities. Hyperventilation was also performed, did not show any abnormalities. Toward the end of the recording, the patient enters the drowsy state with slight symmetric slowing seen. The patient never enters stage II sleep. No abnormal epileptiform discharges seen during this recording. There was diffuse slowing and intermittent left frontal focal slowing. EKG monitor shows no evidence of cardiac rhythm abnormalities with a heart rate of 78.  Impression: This is an abnormal EEG recording in the waking and drowsy state due to:  Intermittent left frontal slowing  Diffuse generalized slowing   Intermittent left frontal slowing is consistent with an area of neuronal dysfunction in the left frontal region and diffuse slowing is consistent with a generalized brain dysfunction. No evidence of interictal epileptiform discharges or seizures seen.    Alric Ran, MD [facility]

## 2021-09-27 ENCOUNTER — Telehealth: Payer: Self-pay | Admitting: Neurology

## 2021-09-27 ENCOUNTER — Other Ambulatory Visit (INDEPENDENT_AMBULATORY_CARE_PROVIDER_SITE_OTHER): Payer: Self-pay

## 2021-09-27 DIAGNOSIS — R569 Unspecified convulsions: Secondary | ICD-10-CM

## 2021-09-27 DIAGNOSIS — Z0289 Encounter for other administrative examinations: Secondary | ICD-10-CM

## 2021-09-27 DIAGNOSIS — D631 Anemia in chronic kidney disease: Secondary | ICD-10-CM | POA: Diagnosis not present

## 2021-09-27 DIAGNOSIS — E875 Hyperkalemia: Secondary | ICD-10-CM | POA: Diagnosis not present

## 2021-09-27 DIAGNOSIS — Z79899 Other long term (current) drug therapy: Secondary | ICD-10-CM | POA: Diagnosis not present

## 2021-09-27 DIAGNOSIS — R82998 Other abnormal findings in urine: Secondary | ICD-10-CM | POA: Diagnosis not present

## 2021-09-27 DIAGNOSIS — N186 End stage renal disease: Secondary | ICD-10-CM | POA: Diagnosis not present

## 2021-09-27 DIAGNOSIS — D509 Iron deficiency anemia, unspecified: Secondary | ICD-10-CM | POA: Diagnosis not present

## 2021-09-27 DIAGNOSIS — Z992 Dependence on renal dialysis: Secondary | ICD-10-CM | POA: Diagnosis not present

## 2021-09-27 DIAGNOSIS — N2581 Secondary hyperparathyroidism of renal origin: Secondary | ICD-10-CM | POA: Diagnosis not present

## 2021-09-27 NOTE — Telephone Encounter (Signed)
Called patient, left a message for her.  Called daughter Magda Paganini discussed CTA head and neck result.  Informed her that the CTA neck showed a nodule in the thyroid and she need further evaluation (thyroid ultrasound) done.  In terms of her EEG it shows diffuse slowing and left frontal slowing likely from the strokes.  All additional questions answered.   If patient's symptoms do not improve or worsen we will consider a longer EEG i.e. a 24-hour ambulatory EEG. Patient will come in the office to have lipid and A1c done; this is in regard of her stroke work-up.   Alric Ran, MD

## 2021-09-27 NOTE — Addendum Note (Signed)
Addended by: Andre Lefort on: 09/27/2021 04:29 PM   Modules accepted: Orders

## 2021-09-28 DIAGNOSIS — D631 Anemia in chronic kidney disease: Secondary | ICD-10-CM | POA: Diagnosis not present

## 2021-09-28 DIAGNOSIS — N2581 Secondary hyperparathyroidism of renal origin: Secondary | ICD-10-CM | POA: Diagnosis not present

## 2021-09-28 DIAGNOSIS — R82998 Other abnormal findings in urine: Secondary | ICD-10-CM | POA: Diagnosis not present

## 2021-09-28 DIAGNOSIS — Z992 Dependence on renal dialysis: Secondary | ICD-10-CM | POA: Diagnosis not present

## 2021-09-28 DIAGNOSIS — N186 End stage renal disease: Secondary | ICD-10-CM | POA: Diagnosis not present

## 2021-09-28 DIAGNOSIS — D509 Iron deficiency anemia, unspecified: Secondary | ICD-10-CM | POA: Diagnosis not present

## 2021-09-28 DIAGNOSIS — Z79899 Other long term (current) drug therapy: Secondary | ICD-10-CM | POA: Diagnosis not present

## 2021-09-28 DIAGNOSIS — E875 Hyperkalemia: Secondary | ICD-10-CM | POA: Diagnosis not present

## 2021-09-28 LAB — HEMOGLOBIN A1C
Est. average glucose Bld gHb Est-mCnc: 212 mg/dL
Hgb A1c MFr Bld: 9 % — ABNORMAL HIGH (ref 4.8–5.6)

## 2021-09-28 LAB — LIPID PANEL
Chol/HDL Ratio: 5.5 ratio — ABNORMAL HIGH (ref 0.0–4.4)
Cholesterol, Total: 182 mg/dL (ref 100–199)
HDL: 33 mg/dL — ABNORMAL LOW (ref >39)
LDL Chol Calc (NIH): 94 mg/dL (ref 0–99)
Triglycerides: 330 mg/dL — ABNORMAL HIGH (ref 0–149)
VLDL Cholesterol Cal: 55 mg/dL — ABNORMAL HIGH (ref 5–40)

## 2021-09-29 DIAGNOSIS — R82998 Other abnormal findings in urine: Secondary | ICD-10-CM | POA: Diagnosis not present

## 2021-09-29 DIAGNOSIS — N186 End stage renal disease: Secondary | ICD-10-CM | POA: Diagnosis not present

## 2021-09-29 DIAGNOSIS — N2581 Secondary hyperparathyroidism of renal origin: Secondary | ICD-10-CM | POA: Diagnosis not present

## 2021-09-29 DIAGNOSIS — D509 Iron deficiency anemia, unspecified: Secondary | ICD-10-CM | POA: Diagnosis not present

## 2021-09-29 DIAGNOSIS — D631 Anemia in chronic kidney disease: Secondary | ICD-10-CM | POA: Diagnosis not present

## 2021-09-29 DIAGNOSIS — Z992 Dependence on renal dialysis: Secondary | ICD-10-CM | POA: Diagnosis not present

## 2021-09-29 DIAGNOSIS — E875 Hyperkalemia: Secondary | ICD-10-CM | POA: Diagnosis not present

## 2021-09-29 DIAGNOSIS — Z79899 Other long term (current) drug therapy: Secondary | ICD-10-CM | POA: Diagnosis not present

## 2021-09-30 DIAGNOSIS — D509 Iron deficiency anemia, unspecified: Secondary | ICD-10-CM | POA: Diagnosis not present

## 2021-09-30 DIAGNOSIS — D631 Anemia in chronic kidney disease: Secondary | ICD-10-CM | POA: Diagnosis not present

## 2021-09-30 DIAGNOSIS — N2581 Secondary hyperparathyroidism of renal origin: Secondary | ICD-10-CM | POA: Diagnosis not present

## 2021-09-30 DIAGNOSIS — R82998 Other abnormal findings in urine: Secondary | ICD-10-CM | POA: Diagnosis not present

## 2021-09-30 DIAGNOSIS — N186 End stage renal disease: Secondary | ICD-10-CM | POA: Diagnosis not present

## 2021-09-30 DIAGNOSIS — Z79899 Other long term (current) drug therapy: Secondary | ICD-10-CM | POA: Diagnosis not present

## 2021-09-30 DIAGNOSIS — E875 Hyperkalemia: Secondary | ICD-10-CM | POA: Diagnosis not present

## 2021-09-30 DIAGNOSIS — Z992 Dependence on renal dialysis: Secondary | ICD-10-CM | POA: Diagnosis not present

## 2021-10-01 DIAGNOSIS — N186 End stage renal disease: Secondary | ICD-10-CM | POA: Diagnosis not present

## 2021-10-01 DIAGNOSIS — Z992 Dependence on renal dialysis: Secondary | ICD-10-CM | POA: Diagnosis not present

## 2021-10-01 DIAGNOSIS — Z79899 Other long term (current) drug therapy: Secondary | ICD-10-CM | POA: Diagnosis not present

## 2021-10-01 DIAGNOSIS — D509 Iron deficiency anemia, unspecified: Secondary | ICD-10-CM | POA: Diagnosis not present

## 2021-10-01 DIAGNOSIS — E875 Hyperkalemia: Secondary | ICD-10-CM | POA: Diagnosis not present

## 2021-10-01 DIAGNOSIS — D631 Anemia in chronic kidney disease: Secondary | ICD-10-CM | POA: Diagnosis not present

## 2021-10-01 DIAGNOSIS — N2581 Secondary hyperparathyroidism of renal origin: Secondary | ICD-10-CM | POA: Diagnosis not present

## 2021-10-01 DIAGNOSIS — R82998 Other abnormal findings in urine: Secondary | ICD-10-CM | POA: Diagnosis not present

## 2021-10-02 DIAGNOSIS — N186 End stage renal disease: Secondary | ICD-10-CM | POA: Diagnosis not present

## 2021-10-02 DIAGNOSIS — Z79899 Other long term (current) drug therapy: Secondary | ICD-10-CM | POA: Diagnosis not present

## 2021-10-02 DIAGNOSIS — Z992 Dependence on renal dialysis: Secondary | ICD-10-CM | POA: Diagnosis not present

## 2021-10-02 DIAGNOSIS — D509 Iron deficiency anemia, unspecified: Secondary | ICD-10-CM | POA: Diagnosis not present

## 2021-10-02 DIAGNOSIS — D631 Anemia in chronic kidney disease: Secondary | ICD-10-CM | POA: Diagnosis not present

## 2021-10-02 DIAGNOSIS — R82998 Other abnormal findings in urine: Secondary | ICD-10-CM | POA: Diagnosis not present

## 2021-10-02 DIAGNOSIS — Z23 Encounter for immunization: Secondary | ICD-10-CM | POA: Diagnosis not present

## 2021-10-02 DIAGNOSIS — E875 Hyperkalemia: Secondary | ICD-10-CM | POA: Diagnosis not present

## 2021-10-02 DIAGNOSIS — N2581 Secondary hyperparathyroidism of renal origin: Secondary | ICD-10-CM | POA: Diagnosis not present

## 2021-10-03 DIAGNOSIS — R82998 Other abnormal findings in urine: Secondary | ICD-10-CM | POA: Diagnosis not present

## 2021-10-03 DIAGNOSIS — N2581 Secondary hyperparathyroidism of renal origin: Secondary | ICD-10-CM | POA: Diagnosis not present

## 2021-10-03 DIAGNOSIS — Z23 Encounter for immunization: Secondary | ICD-10-CM | POA: Diagnosis not present

## 2021-10-03 DIAGNOSIS — D509 Iron deficiency anemia, unspecified: Secondary | ICD-10-CM | POA: Diagnosis not present

## 2021-10-03 DIAGNOSIS — D631 Anemia in chronic kidney disease: Secondary | ICD-10-CM | POA: Diagnosis not present

## 2021-10-03 DIAGNOSIS — Z79899 Other long term (current) drug therapy: Secondary | ICD-10-CM | POA: Diagnosis not present

## 2021-10-03 DIAGNOSIS — E875 Hyperkalemia: Secondary | ICD-10-CM | POA: Diagnosis not present

## 2021-10-03 DIAGNOSIS — Z992 Dependence on renal dialysis: Secondary | ICD-10-CM | POA: Diagnosis not present

## 2021-10-03 DIAGNOSIS — N186 End stage renal disease: Secondary | ICD-10-CM | POA: Diagnosis not present

## 2021-10-04 ENCOUNTER — Other Ambulatory Visit: Payer: Self-pay

## 2021-10-04 ENCOUNTER — Encounter (HOSPITAL_COMMUNITY): Payer: Medicare Other

## 2021-10-04 DIAGNOSIS — Z992 Dependence on renal dialysis: Secondary | ICD-10-CM | POA: Diagnosis not present

## 2021-10-04 DIAGNOSIS — D509 Iron deficiency anemia, unspecified: Secondary | ICD-10-CM | POA: Diagnosis not present

## 2021-10-04 DIAGNOSIS — N186 End stage renal disease: Secondary | ICD-10-CM | POA: Diagnosis not present

## 2021-10-04 DIAGNOSIS — R82998 Other abnormal findings in urine: Secondary | ICD-10-CM | POA: Diagnosis not present

## 2021-10-04 DIAGNOSIS — Z23 Encounter for immunization: Secondary | ICD-10-CM | POA: Diagnosis not present

## 2021-10-04 DIAGNOSIS — Z79899 Other long term (current) drug therapy: Secondary | ICD-10-CM | POA: Diagnosis not present

## 2021-10-04 DIAGNOSIS — D631 Anemia in chronic kidney disease: Secondary | ICD-10-CM | POA: Diagnosis not present

## 2021-10-04 DIAGNOSIS — N2581 Secondary hyperparathyroidism of renal origin: Secondary | ICD-10-CM | POA: Diagnosis not present

## 2021-10-04 DIAGNOSIS — E875 Hyperkalemia: Secondary | ICD-10-CM | POA: Diagnosis not present

## 2021-10-05 ENCOUNTER — Ambulatory Visit (INDEPENDENT_AMBULATORY_CARE_PROVIDER_SITE_OTHER): Payer: Medicare Other | Admitting: Internal Medicine

## 2021-10-05 ENCOUNTER — Encounter: Payer: Self-pay | Admitting: Internal Medicine

## 2021-10-05 VITALS — BP 120/64 | HR 89 | Temp 98.2°F | Wt 182.2 lb

## 2021-10-05 DIAGNOSIS — Z20822 Contact with and (suspected) exposure to covid-19: Secondary | ICD-10-CM | POA: Diagnosis not present

## 2021-10-05 DIAGNOSIS — Z79899 Other long term (current) drug therapy: Secondary | ICD-10-CM | POA: Diagnosis not present

## 2021-10-05 DIAGNOSIS — D509 Iron deficiency anemia, unspecified: Secondary | ICD-10-CM | POA: Diagnosis not present

## 2021-10-05 DIAGNOSIS — Z992 Dependence on renal dialysis: Secondary | ICD-10-CM | POA: Diagnosis not present

## 2021-10-05 DIAGNOSIS — N186 End stage renal disease: Secondary | ICD-10-CM | POA: Diagnosis not present

## 2021-10-05 DIAGNOSIS — Z23 Encounter for immunization: Secondary | ICD-10-CM | POA: Diagnosis not present

## 2021-10-05 DIAGNOSIS — E041 Nontoxic single thyroid nodule: Secondary | ICD-10-CM

## 2021-10-05 DIAGNOSIS — R82998 Other abnormal findings in urine: Secondary | ICD-10-CM | POA: Diagnosis not present

## 2021-10-05 DIAGNOSIS — N2581 Secondary hyperparathyroidism of renal origin: Secondary | ICD-10-CM | POA: Diagnosis not present

## 2021-10-05 DIAGNOSIS — E875 Hyperkalemia: Secondary | ICD-10-CM | POA: Diagnosis not present

## 2021-10-05 DIAGNOSIS — D631 Anemia in chronic kidney disease: Secondary | ICD-10-CM | POA: Diagnosis not present

## 2021-10-05 DIAGNOSIS — Z03818 Encounter for observation for suspected exposure to other biological agents ruled out: Secondary | ICD-10-CM | POA: Diagnosis not present

## 2021-10-05 NOTE — Addendum Note (Signed)
Addended by: Amanda Cockayne on: 10/05/2021 03:22 PM   Modules accepted: Orders

## 2021-10-05 NOTE — Patient Instructions (Signed)
-  Nice seeing you today!!  -Lab work today; will notify you once results are available.  -Flu and pneumonia vaccines today.  -Thyroid US will be ordered.

## 2021-10-05 NOTE — Addendum Note (Signed)
Addended by: Westley Hummer B on: 10/05/2021 04:17 PM   Modules accepted: Orders

## 2021-10-05 NOTE — Addendum Note (Signed)
Addended by: Amanda Cockayne on: 10/05/2021 03:21 PM   Modules accepted: Orders

## 2021-10-05 NOTE — Addendum Note (Signed)
Addended by: Amanda Cockayne on: 10/05/2021 03:15 PM   Modules accepted: Orders

## 2021-10-05 NOTE — Progress Notes (Signed)
Established Patient Office Visit     This visit occurred during the SARS-CoV-2 public health emergency.  Safety protocols were in place, including screening questions prior to the visit, additional usage of staff PPE, and extensive cleaning of exam room while observing appropriate contact time as indicated for disinfecting solutions.    CC/Reason for Visit: Discuss thyroid nodule  HPI: Felicia Thompson is a 75 y.o. female who is coming in today for the above mentioned reasons. 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  She has had periods of staring into space and frequent falls.  She has been seeing neurology and undergoing work-up.  On MRI she was noted to have small punctate areas of stroke.  She had an EEG that just showed diffuse slowing.  She is actually scheduled for prolonged EEG study next week.  During the work-up she had a CTA of the neck that showed a 1.7 cm thyroid nodule and she was referred back to me for work-up.  I note that back in June she was noted to have decreased TSH as well as a low free T3 and free T4 but never returned for follow-up labs.  She is otherwise feeling well and has no complaints today.  She is requesting flu and pneumonia vaccinations.   Past Medical/Surgical History: Past Medical History:  Diagnosis Date   Arthritis    Blood in stool    when appendix burst   Chronic kidney disease    Depression    DM (diabetes mellitus), type 2 with complications ([place])    ESRD on peritoneal dialysis ([place])    History of fainting spells of unknown cause    Hyperlipidemia    Hyperlipidemia associated with type 2 diabetes mellitus (HCC)    Hypertension    OSA (obstructive sleep apnea)     Past Surgical History:  Procedure Laterality Date   ABDOMINAL HYSTERECTOMY     APPENDECTOMY      CHOLECYSTECTOMY     TONSILECTOMY, ADENOIDECTOMY, BILATERAL MYRINGOTOMY AND TUBES      Social History:  reports that she has never smoked. She has never used smokeless tobacco. She reports that she does not drink alcohol and does not use drugs.  Allergies: Allergies  Allergen Reactions   Tape Other (See Comments)    Blisters   Tetracyclines & Related Rash    Family History:  Family History  Problem Relation Age of Onset   Hearing loss Father    Hyperlipidemia Father    Heart disease Father    Early death Father    Cancer Father    Early death Brother    Drug abuse Brother    Diabetes Brother    Depression Brother    Alcohol abuse Brother    Miscarriages / [place] Daughter    Arthritis Daughter    Depression Daughter    Early death Maternal Grandmother    Heart attack Maternal Grandmother    Heart disease Maternal Grandmother    Hearing loss Paternal Grandmother    Heart disease Paternal Grandmother    Heart disease Paternal Grandfather    Early death Paternal Grandfather    Heart attack Paternal Grandfather      Current Outpatient Medications:    aspirin 81 MG chewable tablet, Chew 81 mg by mouth daily., Disp: , Rfl:    atorvastatin (LIPITOR) 80 MG tablet, Take 1 tablet (80 mg total) by  mouth daily., Disp: 90 tablet, Rfl: 1   calcium carbonate (TUMS EX) 750 MG chewable tablet, Chew 1 tablet by mouth 2 (two) times daily., Disp: , Rfl:    carvedilol (COREG) 25 MG tablet, Take 25 mg by mouth 2 (two) times daily with a meal., Disp: , Rfl:    Continuous Blood Gluc Sensor (FREESTYLE LIBRE 2 SENSOR) MISC, 1 Device by Does not apply route every 14 (fourteen) days., Disp: 6 each, Rfl: 3   insulin NPH-regular Human (HUMULIN 70/30) (70-30) 100 UNIT/ML injection, Inject 10 Units into the skin daily with breakfast., Disp: 30 mL, Rfl: 11   losartan (COZAAR) 25 MG tablet, Take 25 mg by mouth 2 (two) times daily., Disp: , Rfl:    Multiple Vitamins-Minerals (RENAPLEX) TABS, Take 1  tablet by mouth daily., Disp: , Rfl:    Probiotic Product ([employer] ADV PROBIOTIC GUMMIES PO), Take by mouth., Disp: , Rfl:    RENVELA 800 MG tablet, Take 1,600 mg by mouth 3 (three) times daily., Disp: , Rfl:   Review of Systems:  Constitutional: Denies fever, chills, diaphoresis, appetite change and fatigue.  HEENT: Denies photophobia, eye pain, redness, hearing loss, ear pain, congestion, sore throat, rhinorrhea, sneezing, mouth sores, trouble swallowing, neck pain, neck stiffness and tinnitus.   Respiratory: Denies SOB, DOE, cough, chest tightness,  and wheezing.   Cardiovascular: Denies chest pain, palpitations and leg swelling.  Gastrointestinal: Denies nausea, vomiting, abdominal pain, diarrhea, constipation, blood in stool and abdominal distention.  Genitourinary: Denies dysuria, urgency, frequency, hematuria, flank pain and difficulty urinating.  Endocrine: Denies: hot or cold intolerance, sweats, changes in hair or nails, polyuria, polydipsia. Musculoskeletal: Denies myalgias, back pain, joint swelling, arthralgias and gait problem.  Skin: Denies pallor, rash and wound.  Neurological: Denies dizziness, seizures, syncope, weakness, light-headedness, numbness and headaches.  Hematological: Denies adenopathy. Easy bruising, personal or family bleeding history  Psychiatric/Behavioral: Denies suicidal ideation, mood changes, confusion, nervousness, sleep disturbance and agitation    Physical Exam: Vitals:   10/05/21 1441  BP: 120/64  Pulse: 89  Temp: 98.2 F (36.8 C)  TempSrc: Oral  SpO2: 97%  Weight: 182 lb 3.2 oz (82.6 kg)    Body mass index is 28.54 kg/m.   Constitutional: NAD, calm, comfortable Eyes: PERRL, lids and conjunctivae normal, wears corrective lenses ENMT: Mucous membranes are moist. . Neck: normal, supple, no masses, no thyromegaly Respiratory: clear to auscultation bilaterally, no wheezing, no crackles. Normal respiratory effort. No accessory muscle use.   Cardiovascular: Regular rate and rhythm, no murmurs / rubs / gallops. No extremity edema.  Neurologic: Grossly intact and nonfocal Psychiatric: Normal judgment and insight. Alert and oriented x 3. Normal mood.    Impression and Plan:  Thyroid nodule greater than or equal to 1 cm in diameter incidentally noted on imaging study  - Plan: T4, free, T3, free, TSH, US THYROID -Further work-up pending these results.  Need for influenza vaccination -Flu vaccine administered today.  Need for vaccination against Streptococcus pneumoniae -PPSV23 today as she had a PCV 13 in 2020.  Time spent: 31 minutes reviewing chart, interviewing and examining patient, formulating plan of care.   Patient Instructions  -Nice seeing you today!!  -Lab work today; will notify you once results are available.  -Flu and pneumonia vaccines today.  -Thyroid US will be ordered.    Lelon Frohlich, MD [facility]

## 2021-10-06 DIAGNOSIS — D509 Iron deficiency anemia, unspecified: Secondary | ICD-10-CM | POA: Diagnosis not present

## 2021-10-06 DIAGNOSIS — Z23 Encounter for immunization: Secondary | ICD-10-CM | POA: Diagnosis not present

## 2021-10-06 DIAGNOSIS — N186 End stage renal disease: Secondary | ICD-10-CM | POA: Diagnosis not present

## 2021-10-06 DIAGNOSIS — N2581 Secondary hyperparathyroidism of renal origin: Secondary | ICD-10-CM | POA: Diagnosis not present

## 2021-10-06 DIAGNOSIS — R82998 Other abnormal findings in urine: Secondary | ICD-10-CM | POA: Diagnosis not present

## 2021-10-06 DIAGNOSIS — D631 Anemia in chronic kidney disease: Secondary | ICD-10-CM | POA: Diagnosis not present

## 2021-10-06 DIAGNOSIS — E875 Hyperkalemia: Secondary | ICD-10-CM | POA: Diagnosis not present

## 2021-10-06 DIAGNOSIS — Z79899 Other long term (current) drug therapy: Secondary | ICD-10-CM | POA: Diagnosis not present

## 2021-10-06 DIAGNOSIS — Z992 Dependence on renal dialysis: Secondary | ICD-10-CM | POA: Diagnosis not present

## 2021-10-06 LAB — T3, FREE: T3, Free: 2.1 pg/mL — ABNORMAL LOW (ref 2.3–4.2)

## 2021-10-06 LAB — TSH: TSH: 2.16 mIU/L (ref 0.40–4.50)

## 2021-10-06 LAB — T4, FREE: Free T4: 1.2 ng/dL (ref 0.8–1.8)

## 2021-10-07 DIAGNOSIS — Z992 Dependence on renal dialysis: Secondary | ICD-10-CM | POA: Diagnosis not present

## 2021-10-07 DIAGNOSIS — Z79899 Other long term (current) drug therapy: Secondary | ICD-10-CM | POA: Diagnosis not present

## 2021-10-07 DIAGNOSIS — D509 Iron deficiency anemia, unspecified: Secondary | ICD-10-CM | POA: Diagnosis not present

## 2021-10-07 DIAGNOSIS — N186 End stage renal disease: Secondary | ICD-10-CM | POA: Diagnosis not present

## 2021-10-07 DIAGNOSIS — E875 Hyperkalemia: Secondary | ICD-10-CM | POA: Diagnosis not present

## 2021-10-07 DIAGNOSIS — N2581 Secondary hyperparathyroidism of renal origin: Secondary | ICD-10-CM | POA: Diagnosis not present

## 2021-10-07 DIAGNOSIS — R82998 Other abnormal findings in urine: Secondary | ICD-10-CM | POA: Diagnosis not present

## 2021-10-07 DIAGNOSIS — D631 Anemia in chronic kidney disease: Secondary | ICD-10-CM | POA: Diagnosis not present

## 2021-10-07 DIAGNOSIS — Z23 Encounter for immunization: Secondary | ICD-10-CM | POA: Diagnosis not present

## 2021-10-08 ENCOUNTER — Inpatient Hospital Stay (HOSPITAL_COMMUNITY): Payer: Medicare Other

## 2021-10-08 ENCOUNTER — Inpatient Hospital Stay (HOSPITAL_COMMUNITY)
Admission: RE | Admit: 2021-10-08 | Discharge: 2021-10-12 | DRG: 312 | Disposition: A | Discharge status: Home / Self Care | Payer: Medicare Other | Attending: Neurology | Admitting: Neurology

## 2021-10-08 DIAGNOSIS — E1142 Type 2 diabetes mellitus with diabetic polyneuropathy: Secondary | ICD-10-CM | POA: Diagnosis present

## 2021-10-08 DIAGNOSIS — E1169 Type 2 diabetes mellitus with other specified complication: Secondary | ICD-10-CM | POA: Diagnosis present

## 2021-10-08 DIAGNOSIS — Z7984 Long term (current) use of oral hypoglycemic drugs: Secondary | ICD-10-CM | POA: Diagnosis not present

## 2021-10-08 DIAGNOSIS — E869 Volume depletion, unspecified: Secondary | ICD-10-CM | POA: Diagnosis present

## 2021-10-08 DIAGNOSIS — Z833 Family history of diabetes mellitus: Secondary | ICD-10-CM

## 2021-10-08 DIAGNOSIS — Z91048 Other nonmedicinal substance allergy status: Secondary | ICD-10-CM | POA: Diagnosis not present

## 2021-10-08 DIAGNOSIS — E778 Other disorders of glycoprotein metabolism: Secondary | ICD-10-CM | POA: Diagnosis present

## 2021-10-08 DIAGNOSIS — E1122 Type 2 diabetes mellitus with diabetic chronic kidney disease: Secondary | ICD-10-CM | POA: Diagnosis present

## 2021-10-08 DIAGNOSIS — I120 Hypertensive chronic kidney disease with stage 5 chronic kidney disease or end stage renal disease: Secondary | ICD-10-CM | POA: Diagnosis present

## 2021-10-08 DIAGNOSIS — Z8249 Family history of ischemic heart disease and other diseases of the circulatory system: Secondary | ICD-10-CM

## 2021-10-08 DIAGNOSIS — E118 Type 2 diabetes mellitus with unspecified complications: Secondary | ICD-10-CM

## 2021-10-08 DIAGNOSIS — E871 Hypo-osmolality and hyponatremia: Secondary | ICD-10-CM | POA: Diagnosis not present

## 2021-10-08 DIAGNOSIS — Z992 Dependence on renal dialysis: Secondary | ICD-10-CM

## 2021-10-08 DIAGNOSIS — E11649 Type 2 diabetes mellitus with hypoglycemia without coma: Secondary | ICD-10-CM | POA: Diagnosis not present

## 2021-10-08 DIAGNOSIS — D509 Iron deficiency anemia, unspecified: Secondary | ICD-10-CM | POA: Diagnosis not present

## 2021-10-08 DIAGNOSIS — R404 Transient alteration of awareness: Secondary | ICD-10-CM

## 2021-10-08 DIAGNOSIS — D631 Anemia in chronic kidney disease: Secondary | ICD-10-CM | POA: Diagnosis not present

## 2021-10-08 DIAGNOSIS — I951 Orthostatic hypotension: Principal | ICD-10-CM

## 2021-10-08 DIAGNOSIS — N186 End stage renal disease: Secondary | ICD-10-CM

## 2021-10-08 DIAGNOSIS — R569 Unspecified convulsions: Secondary | ICD-10-CM | POA: Diagnosis not present

## 2021-10-08 DIAGNOSIS — Z794 Long term (current) use of insulin: Secondary | ICD-10-CM

## 2021-10-08 DIAGNOSIS — Z881 Allergy status to other antibiotic agents status: Secondary | ICD-10-CM

## 2021-10-08 DIAGNOSIS — F32A Depression, unspecified: Secondary | ICD-10-CM | POA: Diagnosis present

## 2021-10-08 DIAGNOSIS — N2581 Secondary hyperparathyroidism of renal origin: Secondary | ICD-10-CM | POA: Diagnosis present

## 2021-10-08 DIAGNOSIS — Z83438 Family history of other disorder of lipoprotein metabolism and other lipidemia: Secondary | ICD-10-CM

## 2021-10-08 DIAGNOSIS — E785 Hyperlipidemia, unspecified: Secondary | ICD-10-CM | POA: Diagnosis present

## 2021-10-08 DIAGNOSIS — M898X9 Other specified disorders of bone, unspecified site: Secondary | ICD-10-CM | POA: Diagnosis present

## 2021-10-08 DIAGNOSIS — Z7982 Long term (current) use of aspirin: Secondary | ICD-10-CM

## 2021-10-08 DIAGNOSIS — E8809 Other disorders of plasma-protein metabolism, not elsewhere classified: Secondary | ICD-10-CM | POA: Diagnosis present

## 2021-10-08 DIAGNOSIS — R82998 Other abnormal findings in urine: Secondary | ICD-10-CM | POA: Diagnosis not present

## 2021-10-08 DIAGNOSIS — Z79899 Other long term (current) drug therapy: Secondary | ICD-10-CM | POA: Diagnosis not present

## 2021-10-08 DIAGNOSIS — E875 Hyperkalemia: Secondary | ICD-10-CM | POA: Diagnosis not present

## 2021-10-08 DIAGNOSIS — E041 Nontoxic single thyroid nodule: Secondary | ICD-10-CM | POA: Diagnosis present

## 2021-10-08 DIAGNOSIS — Z23 Encounter for immunization: Secondary | ICD-10-CM | POA: Diagnosis not present

## 2021-10-08 LAB — PHOSPHORUS: Phosphorus: 6.2 mg/dL — ABNORMAL HIGH (ref 2.5–4.6)

## 2021-10-08 LAB — COMPREHENSIVE METABOLIC PANEL
ALT: 31 U/L (ref 0–44)
AST: 15 U/L (ref 15–41)
Albumin: 2.9 g/dL — ABNORMAL LOW (ref 3.5–5.0)
Alkaline Phosphatase: 49 U/L (ref 38–126)
Anion gap: 14 (ref 5–15)
BUN: 69 mg/dL — ABNORMAL HIGH (ref 8–23)
CO2: 25 mmol/L (ref 22–32)
Calcium: 8.2 mg/dL — ABNORMAL LOW (ref 8.9–10.3)
Chloride: 97 mmol/L — ABNORMAL LOW (ref 98–111)
Creatinine, Ser: 8.89 mg/dL — ABNORMAL HIGH (ref 0.44–1.00)
GFR, Estimated: 4 mL/min — ABNORMAL LOW (ref >60)
Glucose, Bld: 216 mg/dL — ABNORMAL HIGH (ref 70–99)
Potassium: 3.9 mmol/L (ref 3.5–5.1)
Sodium: 136 mmol/L (ref 135–145)
Total Bilirubin: 0.8 mg/dL (ref 0.3–1.2)
Total Protein: 5.6 g/dL — ABNORMAL LOW (ref 6.5–8.1)

## 2021-10-08 LAB — CBC WITH DIFFERENTIAL/PLATELET
Abs Immature Granulocytes: 0.09 10*3/uL — ABNORMAL HIGH (ref 0.00–0.07)
Basophils Absolute: 0.1 10*3/uL (ref 0.0–0.1)
Basophils Relative: 1 %
Eosinophils Absolute: 0.2 10*3/uL (ref 0.0–0.5)
Eosinophils Relative: 2 %
HCT: 22.9 % — ABNORMAL LOW (ref 36.0–46.0)
Hemoglobin: 7.7 g/dL — ABNORMAL LOW (ref 12.0–15.0)
Immature Granulocytes: 1 %
Lymphocytes Relative: 18 %
Lymphs Abs: 2 10*3/uL (ref 0.7–4.0)
MCH: 31.4 pg (ref 26.0–34.0)
MCHC: 33.6 g/dL (ref 30.0–36.0)
MCV: 93.5 fL (ref 80.0–100.0)
Monocytes Absolute: 0.8 10*3/uL (ref 0.1–1.0)
Monocytes Relative: 7 %
Neutro Abs: 8.2 10*3/uL — ABNORMAL HIGH (ref 1.7–7.7)
Neutrophils Relative %: 71 %
Platelets: 246 10*3/uL (ref 150–400)
RBC: 2.45 MIL/uL — ABNORMAL LOW (ref 3.87–5.11)
RDW: 15.7 % — ABNORMAL HIGH (ref 11.5–15.5)
WBC: 11.4 10*3/uL — ABNORMAL HIGH (ref 4.0–10.5)
nRBC: 0 % (ref 0.0–0.2)

## 2021-10-08 LAB — PROTIME-INR
INR: 1.1 (ref 0.8–1.2)
Prothrombin Time: 14.3 seconds (ref 11.4–15.2)

## 2021-10-08 LAB — GLUCOSE, CAPILLARY
Glucose-Capillary: 183 mg/dL — ABNORMAL HIGH (ref 70–99)
Glucose-Capillary: 147 mg/dL — ABNORMAL HIGH (ref 70–99)

## 2021-10-08 LAB — MAGNESIUM: Magnesium: 2.2 mg/dL (ref 1.7–2.4)

## 2021-10-08 MED ORDER — INSULIN ASPART 100 UNIT/ML IJ SOLN
0.0000 [IU] | Freq: Three times a day (TID) | INTRAMUSCULAR | Status: DC
  Administered 2021-10-10: 2 [IU] via SUBCUTANEOUS
  Administered 2021-10-11: 1 [IU] via SUBCUTANEOUS

## 2021-10-08 MED ORDER — LABETALOL HCL 5 MG/ML IV SOLN
5.0000 mg | INTRAVENOUS | Status: DC | PRN
  Administered 2021-10-10 (×2): 5 mg via INTRAVENOUS
  Filled 2021-10-08: qty 4

## 2021-10-08 MED ORDER — ATORVASTATIN CALCIUM 80 MG PO TABS
80.0000 mg | ORAL_TABLET | Freq: Every day | ORAL | Status: DC
  Administered 2021-10-09 – 2021-10-12 (×4): 80 mg via ORAL
  Filled 2021-10-08 (×4): qty 1

## 2021-10-08 MED ORDER — ENOXAPARIN SODIUM 30 MG/0.3ML IJ SOSY
30.0000 mg | PREFILLED_SYRINGE | INTRAMUSCULAR | Status: DC
  Administered 2021-10-09 – 2021-10-12 (×4): 30 mg via SUBCUTANEOUS
  Filled 2021-10-08 (×4): qty 0.3

## 2021-10-08 MED ORDER — INSULIN ASPART 100 UNIT/ML IJ SOLN
0.0000 [IU] | Freq: Every day | INTRAMUSCULAR | Status: DC
  Administered 2021-10-10: 2 [IU] via SUBCUTANEOUS

## 2021-10-08 MED ORDER — LORAZEPAM 2 MG/ML IJ SOLN: 2.0000 mg | INTRAMUSCULAR | Status: DC | PRN

## 2021-10-08 MED ORDER — ENOXAPARIN SODIUM 40 MG/0.4ML IJ SOSY
40.0000 mg | PREFILLED_SYRINGE | INTRAMUSCULAR | Status: DC
  Administered 2021-10-08: 40 mg via SUBCUTANEOUS
  Filled 2021-10-08: qty 0.4

## 2021-10-08 MED ORDER — LOSARTAN POTASSIUM 25 MG PO TABS
25.0000 mg | ORAL_TABLET | Freq: Two times a day (BID) | ORAL | Status: DC
  Administered 2021-10-08 – 2021-10-09 (×3): 25 mg via ORAL
  Filled 2021-10-08 (×3): qty 1

## 2021-10-08 MED ORDER — CARVEDILOL 12.5 MG PO TABS
25.0000 mg | ORAL_TABLET | Freq: Two times a day (BID) | ORAL | Status: DC
  Administered 2021-10-08 – 2021-10-12 (×9): 25 mg via ORAL
  Filled 2021-10-08 (×9): qty 2

## 2021-10-08 MED ORDER — VENLAFAXINE HCL ER 75 MG PO CP24
150.0000 mg | ORAL_CAPSULE | Freq: Every day | ORAL | Status: DC
  Administered 2021-10-08 – 2021-10-12 (×5): 150 mg via ORAL
  Filled 2021-10-08 (×5): qty 2

## 2021-10-08 MED ORDER — SEVELAMER CARBONATE 800 MG PO TABS
1600.0000 mg | ORAL_TABLET | Freq: Three times a day (TID) | ORAL | Status: DC
  Administered 2021-10-08 – 2021-10-12 (×13): 1600 mg via ORAL
  Filled 2021-10-08 (×13): qty 2

## 2021-10-08 MED ORDER — ACETAMINOPHEN 650 MG RE SUPP: 650.0000 mg | RECTAL | Status: DC | PRN

## 2021-10-08 MED ORDER — GLIPIZIDE 5 MG PO TABS
5.0000 mg | ORAL_TABLET | Freq: Three times a day (TID) | ORAL | Status: DC
  Administered 2021-10-08 – 2021-10-09 (×2): 5 mg via ORAL
  Filled 2021-10-08 (×3): qty 1

## 2021-10-08 MED ORDER — ACETAMINOPHEN 325 MG PO TABS: 650.0000 mg | ORAL_TABLET | ORAL | Status: DC | PRN

## 2021-10-08 MED ORDER — ASPIRIN 81 MG PO CHEW
81.0000 mg | CHEWABLE_TABLET | Freq: Every day | ORAL | Status: DC
  Administered 2021-10-09 – 2021-10-12 (×4): 81 mg via ORAL
  Filled 2021-10-08 (×4): qty 1

## 2021-10-08 MED ORDER — GLIPIZIDE 5 MG PO TABS
5.0000 mg | ORAL_TABLET | Freq: Two times a day (BID) | ORAL | Status: DC
  Administered 2021-10-08: 5 mg via ORAL
  Filled 2021-10-08: qty 1

## 2021-10-08 MED ORDER — INSULIN ASPART PROT & ASPART (70-30 MIX) 100 UNIT/ML ~~LOC~~ SUSP
10.0000 [IU] | Freq: Every day | SUBCUTANEOUS | Status: DC
  Administered 2021-10-09 – 2021-10-12 (×4): 10 [IU] via SUBCUTANEOUS
  Filled 2021-10-08: qty 10

## 2021-10-08 NOTE — Progress Notes (Signed)
EEG LTM hookup:  EMU Hookup complete - no initial skin breakdown.  [facility] notified and monitoring.  Push button tested with [facility].

## 2021-10-08 NOTE — Progress Notes (Signed)
Patient receives peritoneal dialysis with [facility] in [place]  There are no urgent indications for dialysis tonight  Patient was admitted for evaluation of presyncope  K 3.9   Na 136  Co2 25  Ca 8.2  Phos 6.2  Mg 2.2   BP 145/69 lying P 86   Afeb  Sats 99% room air  Dialysis orders can be obtained from [place] in the morning

## 2021-10-08 NOTE — Plan of Care (Signed)

## 2021-10-08 NOTE — H&P (Signed)
CC: dizziness with alteration of awareness  History is obtained from: patient  HPI: Felicia Thompson is a 75 y.o. female with PMH of DM, ESRD on peritoneal dialysis, HTN and anxiety/depression, peripheral neuropathy who is admitted to epilepsy monitoring unit for characterization of spells.  Patient states she had her first episode in February 2022. States she thought she was brushing her teeth and the next thing she remembers is she was sitting on the commode and her daughter in law was standing in front of her while she was holding her. She isnt sure if she had any jerking movements. Since then she has had episodes of feeling dizzy, described as if things are spinning followed by sometimes staring off and falling down. She has injured her head due to the fall in the past. Also reports having urinary incontinence with the episodes at times. She states these episodes used to happen maybe twice a month but one week in October she had then every other day. No clear provoking factors except she thinks sometimes episode happens if she is out in the heat. States all episodes have only happened while standing or sitting up, never laying down. States she started dialysis in January this year and has had her anti-hypertensives adjusted, was taken off hydralazine maybe around June this year.   Epilepsy risk factors: normal delivery, denies NICU stay, denies febrile seizures, denies head injury with LOC, no family history of epilepsy except one daughter had febrile seizures.    ROS: All other systems reviewed and negative except as noted in the HPI.    Past Medical History:  Diagnosis Date   Arthritis    Blood in stool    when appendix burst   Chronic kidney disease    Depression    DM (diabetes mellitus), type 2 with complications (HCC)    ESRD on peritoneal dialysis ([place])    History of fainting spells of unknown cause    Hyperlipidemia    Hyperlipidemia associated with type 2 diabetes mellitus  (HCC)    Hypertension    OSA (obstructive sleep apnea)     Family History  Problem Relation Age of Onset   Hearing loss Father    Hyperlipidemia Father    Heart disease Father    Early death Father    Cancer Father    Early death Brother    Drug abuse Brother    Diabetes Brother    Depression Brother    Alcohol abuse Brother    Miscarriages / [place] Daughter    Arthritis Daughter    Depression Daughter    Early death Maternal Grandmother    Heart attack Maternal Grandmother    Heart disease Maternal Grandmother    Hearing loss Paternal Grandmother    Heart disease Paternal Grandmother    Heart disease Paternal Grandfather    Early death Paternal Grandfather    Heart attack Paternal Grandfather     Social History:  reports that she has never smoked. She has never used smokeless tobacco. She reports that she does not drink alcohol and does not use drugs.   Medications Prior to Admission  Medication Sig Dispense Refill Last Dose   aspirin 81 MG chewable tablet Chew 81 mg by mouth daily.   10/07/2021   atorvastatin (LIPITOR) 80 MG tablet Take 1 tablet (80 mg total) by mouth daily. 90 tablet 1 10/07/2021   carvedilol (COREG) 25 MG tablet Take 25 mg by mouth 2 (two) times daily with a meal.  10/07/2021   glipiZIDE (GLUCOTROL) 5 MG tablet Take 5 mg by mouth with breakfast, with lunch, and with evening meal.   10/07/2021   insulin NPH-regular Human (HUMULIN 70/30) (70-30) 100 UNIT/ML injection Inject 10 Units into the skin daily with breakfast. 30 mL 11 10/08/2021   losartan (COZAAR) 50 MG tablet Take 25 mg by mouth 2 (two) times daily.   10/07/2021   Multiple Vitamins-Minerals (RENAPLEX) TABS Take 1 tablet by mouth daily.   10/07/2021   Probiotic Product ([employer]S ADV PROBIOTIC GUMMIES PO) Take 1 tablet by mouth at bedtime.   10/07/2021   RENVELA 800 MG tablet Take 1,600 mg by mouth 3 (three) times daily with meals.   10/07/2021   venlafaxine XR (EFFEXOR-XR) 150 MG 24 hr capsule Take 150  mg by mouth daily with breakfast.   10/07/2021   vitamin B-12 (CYANOCOBALAMIN) 1000 MCG tablet Take 1,000 mcg by mouth every other day.   10/06/2021   Continuous Blood Gluc Sensor (FREESTYLE LIBRE 2 SENSOR) MISC 1 Device by Does not apply route every 14 (fourteen) days. 6 each 3       Exam: Current vital signs: BP (!) 145/69 (BP Location: Right Arm)   Pulse 87   Temp 98 F (36.7 C) (Oral)   Resp 19   Ht 5\' 7"  (1.702 m)   Wt 82 kg   SpO2 100%   BMI 28.31 kg/m  Vital signs in last 24 hours: Temp:  [98 F (36.7 C)] 98 F (36.7 C) (11/07 0841) Pulse Rate:  [84-87] 87 (11/07 1730) Resp:  [14-19] 19 (11/07 1730) BP: (136-145)/(58-78) 145/69 (11/07 1730) SpO2:  [100 %] 100 % (11/07 1730) Weight:  [82 kg] 82 kg (11/07 0841)   Physical Exam  Constitutional: Appears well-developed and well-nourished.  Psych: Affect appropriate to situation Eyes: No scleral injection HENT: No OP obstrucion Head: Normocephalic.  Cardiovascular: Normal rate and regular rhythm.  Respiratory: Effort normal, non-labored breathing GI: Soft.  No distension. There is no tenderness.  Skin: Warm, no rash Neuro: Aox3, CN 2-12 grossly intact, 5/5 in all extremities   I have reviewed labs in epic and the results pertinent to this consultation are: CBC:  Recent Labs  Lab 10/08/21 0851  WBC 11.4*  NEUTROABS 8.2*  HGB 7.7*  HCT 22.9*  MCV 93.5  PLT 194    Basic Metabolic Panel:  Lab Results  Component Value Date   NA 136 10/08/2021   K 3.9 10/08/2021   CO2 25 10/08/2021   GLUCOSE 216 (H) 10/08/2021   BUN 69 (H) 10/08/2021   CREATININE 8.89 (H) 10/08/2021   CALCIUM 8.2 (L) 10/08/2021   GFRNONAA 4 (L) 10/08/2021   Lipid Panel:  Lab Results  Component Value Date   LDLCALC 94 09/27/2021   HgbA1c:  Lab Results  Component Value Date   HGBA1C 9.0 (H) 09/27/2021   Urine Drug Screen: No results found for: LABOPIA, COCAINSCRNUR, LABBENZ, AMPHETMU, THCU, LABBARB  Alcohol Level No results found  for: ETH   I have reviewed the images obtained:  CT head wo contrast 09/24/2021:  1. Patchy and ill-defined hypoattenuation within the bilateral cerebral white matter, compatible with a combination of chronic small vessel ischemic disease and recent infarcts (as were demonstrated on the prior brain MRI of 09/14/2021). 2. A known recent lacunar infarct within the left cerebellar hemisphere was better appreciated on this prior exam.  3. Mild paranasal sinus disease, as described.  CT angio Head and Neck with contrast 09/24/2021:  The common  carotid and internal carotid arteries are patent within the neck with mild atherosclerotic plaque, but without stenosis. 2. The vertebral arteries are codominant and patent within the neck. Soft and calcified plaque results in at least moderate stenosis at the origin of the right vertebral artery. 3. Atherosclerotic plaque within the aortic arch and proximal major branch vessels of the neck. (Aortic Atherosclerosis (ICD10-I70.0)). 4. Thyroid nodules measuring up to 1.7 cm. A thyroid ultrasound is recommended for further evaluation.   CTA head:   1. No intracranial large vessel occlusion. 2. Calcified plaque within the proximal V4 vertebral arteries bilaterally with up to moderate/severe stenosis on the right, and mild-to-moderate stenosis on the left. 3. Mild stenosis within the distal M1 segment of the right middle cerebral artery. 4. Nonstenotic calcified plaque within the intracranial ICAs bilaterally.   MRI Brain wo contrast 09/15/2021:  Scattered T2/FLAIR hyperintense foci in the cerebral hemispheres, right midbrain and left cerebellar hemisphere consistent with ischemic changes.  Several of the foci in the cerebral hemispheres and the 1 in the left cerebellar hemisphere appear to show restricted diffusion on diffusion-weighted images consistent with acute ischemic events.  As the distribution is bilateral in both the anterior and posterior  circulation, consider a cardioembolic etiology.   2.  Mild generalized cortical atrophy, typical for age.   ASSESSMENT/PLAN: 75yo F with episodes of dizziness with alteration of awareness and fall.   Transient alteration of awareness Dizziness Syncope - obtain vEEG for characterization of spells - Not on Aeds at home - will perform HV and photic tomorrow - will also check orthostatic vital signs - continue seizure precautions   ESRD - consulted nephrology for peritoneal dialysis  HTN - continue home meds  DM - continue home meds  Depression - Continue venlafaxine   Thank you for allowing [place] to participate in the care of this patient. If you have any further questions, please contact  me or neurohospitalist.   Zeb Comfort Epilepsy [facility] neurohospitalist

## 2021-10-09 LAB — RENAL FUNCTION PANEL
Albumin: 2.5 g/dL — ABNORMAL LOW (ref 3.5–5.0)
Anion gap: 13 (ref 5–15)
BUN: 87 mg/dL — ABNORMAL HIGH (ref 8–23)
CO2: 25 mmol/L (ref 22–32)
Calcium: 7.6 mg/dL — ABNORMAL LOW (ref 8.9–10.3)
Chloride: 95 mmol/L — ABNORMAL LOW (ref 98–111)
Creatinine, Ser: 8.91 mg/dL — ABNORMAL HIGH (ref 0.44–1.00)
GFR, Estimated: 4 mL/min — ABNORMAL LOW (ref >60)
Glucose, Bld: 172 mg/dL — ABNORMAL HIGH (ref 70–99)
Phosphorus: 7.6 mg/dL — ABNORMAL HIGH (ref 2.5–4.6)
Potassium: 4.7 mmol/L (ref 3.5–5.1)
Sodium: 133 mmol/L — ABNORMAL LOW (ref 135–145)

## 2021-10-09 LAB — GLUCOSE, CAPILLARY
Glucose-Capillary: 54 mg/dL — ABNORMAL LOW (ref 70–99)
Glucose-Capillary: 61 mg/dL — ABNORMAL LOW (ref 70–99)
Glucose-Capillary: 96 mg/dL (ref 70–99)
Glucose-Capillary: 103 mg/dL — ABNORMAL HIGH (ref 70–99)
Glucose-Capillary: 121 mg/dL — ABNORMAL HIGH (ref 70–99)
Glucose-Capillary: 153 mg/dL — ABNORMAL HIGH (ref 70–99)

## 2021-10-09 LAB — ABO/RH: ABO/RH(D): A POS

## 2021-10-09 LAB — PREPARE RBC (CROSSMATCH)

## 2021-10-09 MED ORDER — DELFLEX-LC/2.5% DEXTROSE 394 MOSM/L IP SOLN: INTRAPERITONEAL | Status: DC

## 2021-10-09 MED ORDER — DEXTROSE 50 % IV SOLN: 1.0000 | INTRAVENOUS | Status: DC | PRN

## 2021-10-09 MED ORDER — SODIUM CHLORIDE 0.9% IV SOLUTION: Freq: Once | INTRAVENOUS | Status: AC

## 2021-10-09 MED ORDER — DELFLEX-LC/2.5% DEXTROSE 394 MOSM/L IP SOLN
INTRAPERITONEAL | Status: DC
  Administered 2021-10-09: 4000 mL via INTRAPERITONEAL

## 2021-10-09 MED ORDER — LOSARTAN POTASSIUM 25 MG PO TABS
25.0000 mg | ORAL_TABLET | Freq: Every day | ORAL | Status: DC
  Administered 2021-10-10: 25 mg via ORAL
  Filled 2021-10-09: qty 1

## 2021-10-09 MED ORDER — GENTAMICIN SULFATE 0.1 % EX CREA
1.0000 "application " | TOPICAL_CREAM | Freq: Every day | CUTANEOUS | Status: DC
  Administered 2021-10-09 – 2021-10-12 (×4): 1 via TOPICAL
  Filled 2021-10-09 (×2): qty 15

## 2021-10-09 MED ORDER — DELFLEX-LC/1.5% DEXTROSE 344 MOSM/L IP SOLN
INTRAPERITONEAL | Status: DC
  Administered 2021-10-09: 4000 mL via INTRAPERITONEAL

## 2021-10-09 MED ORDER — CHLORHEXIDINE GLUCONATE CLOTH 2 % EX PADS
6.0000 | MEDICATED_PAD | Freq: Every morning | CUTANEOUS | Status: DC
  Administered 2021-10-10 – 2021-10-11 (×2): 6 via TOPICAL

## 2021-10-09 MED ORDER — DELFLEX-LC/1.5% DEXTROSE 344 MOSM/L IP SOLN: INTRAPERITONEAL | Status: DC

## 2021-10-09 NOTE — Progress Notes (Signed)
Inpatient Diabetes Program Recommendations  AACE/ADA: New Consensus Statement on Inpatient Glycemic Control (2015)  Target Ranges:  Prepandial:   less than 140 mg/dL      Peak postprandial:   less than 180 mg/dL (1-2 hours)      Critically ill patients:  140 - 180 mg/dL  Results for Felicia Thompson, Felicia Thompson (MRN [id number]) as of 10/09/2021 10:54  Ref. Range 10/08/2021 10:01 10/08/2021 22:06 10/09/2021 06:32 10/09/2021 06:51 10/09/2021 07:30  Glucose-Capillary Latest Ref Range: 70 - 99 mg/dL 183 (H) 147 (H) 54 (L) 61 (L) 96   Admit with:  Dizziness Syncope  History: DM, ESRD  Home DM Meds: 70/30 Insulin 10 units QAM with Breakfast      Glipizide 5 mg TID  Current Orders: 70/30 Insulin 10 units QAM with Breakfast      Glipizide 5 mg TID      Novolog 0-6 units TID AC + HS    MD- Note Hypoglycemia this AM  Please consider d/c of the Glipizide until pt ready to discharge home    --Will follow patient during hospitalization--  Wyn Quaker RN, MSN, CDE Diabetes Coordinator Inpatient Glycemic Control Team Team Pager: [phone] (8a-5p)

## 2021-10-09 NOTE — Procedures (Signed)
Patient Name: Felicia Thompson  MRN: [id number]  Epilepsy Attending: Lora Havens  Referring Physician/Provider: Dr. Zeb Comfort Duration: 10/08/2021 0846 to 10/09/2021 0846  Patient history: 75yo F with episodes of dizziness with alteration of awareness and fall.  EEG to evaluate for seizures.  Level of alertness: Awake, asleep  AEDs during EEG study: None  Technical aspects: This EEG study was done with scalp electrodes positioned according to the 10-20 International system of electrode placement. Electrical activity was acquired at a sampling rate of 500Hz  and reviewed with a high frequency filter of 70Hz  and a low frequency filter of 1Hz . EEG data were recorded continuously and digitally stored.   Description: The posterior dominant rhythm consists of 9 Hz activity of moderate voltage (25-35 uV) seen predominantly in posterior head regions, symmetric and reactive to eye opening and eye closing. Sleep was characterized by vertex waves, sleep spindles (12 to 14 Hz), maximal frontocentral region. Hyperventilation and photic stimulation were not performed.     IMPRESSION: This study is within normal limits. No seizures or epileptiform discharges were seen throughout the recording.  Felicia Thompson

## 2021-10-09 NOTE — Consult Note (Addendum)
[facility] Medical Consultation  [name] BHA:[id number] DOB: [birth year] DOA: 10/08/2021 PCP: Isaac Bliss, Rayford Halsted, MD   Requesting physician: Zeb Comfort, MD Date of consultation: 10/09/2021 Reason for consultation: Hypoglycemia  Impression/Recommendations Active Problems:   Seizure ([facility])    Transient altered awareness: Currently undergoing continuous EEG monitoring. -Per neurology  Orthostatic hypotension: Orthostatic vital signs were noted to be positive.  Thought possibly related with patient fluid status and/or anemia -Losartan was reduced to 25 mg daily by nephrology  ESRD on PD -Per nephrology  Diabetes mellitus type 2 with hypoglycemia: Acute.  Patient noted to have an blood sugars as low as 54 this morning.  However, also reports having low blood sugars at home prior to arrival.  She just recently had been started on Humulin 70/30 insulin 10 units every morning by Dr. Loanne Drilling of endocrinology in addition to glipizide 3 times daily with meals.  Would suspect glipizide given patient kidney function as a likely cause of hypoglycemic episodes. -Hypoglycemic protocols -Held glipizide(recommend possibly holding even at discharge until able to follow-up with Dr. Loanne Drilling) -Continue Humulin 70/30 insulin -Continue CBGs before every meal and nightly with very sensitive SSI -Would discontinue bedtime insulin if hypoglycemia recurs  Anemia chronic kidney disease: Hemoglobin noted to be 7.  Patient was ordered and transfused 1 unit of packed red blood cells. -Continue to monitor  I will followup again tomorrow. Please contact me if I can be of assistance in the meanwhile. Thank you for this consultation.  Chief Complaint: Dizzy spells  HPI:  Felicia Thompson is a 75 y.o. female with medical history significant of HTN, HLD, ESRD on PD, DM type II with neuropathy, who was admitted to hospital under neurology service with complaints of "dizzy" spells.   Symptoms initially started back in February of this year.  Sometimes patient reports that she some of the episodes she remembers and others she does not.  There have been times where she loses consciousness and/or may have incontinence of her bowels.  No clear reports of seizure-like activity seen from what I can gather from patient.  She had been taken off of a blood pressure medicine in June.  She normally checks blood sugars twice a day and reports that she has intermittently had low blood sugars.  About 2 months ago she reports that she had been referred to Dr. Loanne Drilling of endocrinology for management of her diabetes.  At that time her hemoglobin A1c was 9 and at that time she had been started on Humulin 70/30 insulin 10 units with breakfast in addition to glipizide which she was already taken 3 times daily with meals.  Patient reports that she had intermittently be having low blood sugars at home and cannot recall if they were more frequent or less frequent since being started on insulin.  However in the hospital patient's blood sugar was noted to drop down as low as 54 with improvement after being given food.   Review of Systems  Neurological:  Positive for dizziness.  Psychiatric/Behavioral:  Positive for memory loss.     Past Medical History:  Diagnosis Date   Arthritis    Blood in stool    when appendix burst   Chronic kidney disease    Depression    DM (diabetes mellitus), type 2 with complications ([place])    ESRD on peritoneal dialysis ([place])    History of fainting spells of unknown cause    Hyperlipidemia    Hyperlipidemia associated with type 2 diabetes  mellitus ([place])    Hypertension    OSA (obstructive sleep apnea)    Past Surgical History:  Procedure Laterality Date   ABDOMINAL HYSTERECTOMY     APPENDECTOMY     CHOLECYSTECTOMY     TONSILECTOMY, ADENOIDECTOMY, BILATERAL MYRINGOTOMY AND TUBES     Social History:  reports that she has never smoked. She has never used smokeless  tobacco. She reports that she does not drink alcohol and does not use drugs.  Allergies  Allergen Reactions   Tape Other (See Comments)    Blisters   Tetracyclines & Related Rash   Family History  Problem Relation Age of Onset   Hearing loss Father    Hyperlipidemia Father    Heart disease Father    Early death Father    Cancer Father    Early death Brother    Drug abuse Brother    Diabetes Brother    Depression Brother    Alcohol abuse Brother    Miscarriages / [place] Daughter    Arthritis Daughter    Depression Daughter    Early death Maternal Grandmother    Heart attack Maternal Grandmother    Heart disease Maternal Grandmother    Hearing loss Paternal Grandmother    Heart disease Paternal Grandmother    Heart disease Paternal Grandfather    Early death Paternal Grandfather    Heart attack Paternal Grandfather     Prior to Admission medications   Medication Sig Start Date End Date Taking? Authorizing Provider  aspirin 81 MG chewable tablet Chew 81 mg by mouth daily.   Yes [provider]  atorvastatin (LIPITOR) 80 MG tablet Take 1 tablet (80 mg total) by mouth daily. 05/15/21  Yes Isaac Bliss, Rayford Halsted, MD  carvedilol (COREG) 25 MG tablet Take 25 mg by mouth 2 (two) times daily with a meal.   Yes [provider]  glipiZIDE (GLUCOTROL) 5 MG tablet Take 5 mg by mouth with breakfast, with lunch, and with evening meal.   Yes [provider]  insulin NPH-regular Human (HUMULIN 70/30) (70-30) 100 UNIT/ML injection Inject 10 Units into the skin daily with breakfast. 08/17/21  Yes Renato Shin, MD  losartan (COZAAR) 50 MG tablet Take 25 mg by mouth 2 (two) times daily.   Yes [provider]  Multiple Vitamins-Minerals (RENAPLEX) TABS Take 1 tablet by mouth daily. 08/30/21  Yes [provider]  Probiotic Product ([employer] ADV PROBIOTIC GUMMIES PO) Take 1 tablet by mouth at bedtime.   Yes [provider]  RENVELA 800 MG  tablet Take 1,600 mg by mouth 3 (three) times daily with meals. 08/30/21  Yes [provider]  venlafaxine XR (EFFEXOR-XR) 150 MG 24 hr capsule Take 150 mg by mouth daily with breakfast.   Yes [provider]  vitamin B-12 (CYANOCOBALAMIN) 1000 MCG tablet Take 1,000 mcg by mouth every other day.   Yes [provider]  Continuous Blood Gluc Sensor (FREESTYLE LIBRE 2 SENSOR) MISC 1 Device by Does not apply route every 14 (fourteen) days. 08/15/21   Renato Shin, MD   Physical Exam:  Constitutional: Elderly female currently NAD, calm, comfortable Vitals:   10/09/21 0723 10/09/21 0821 10/09/21 1044 10/09/21 1158  BP: (!) 136/58 (!) 136/58  (!) 125/59  Pulse: 76 73    Resp: 16  12 20   Temp: 98.4 F (36.9 C)     TempSrc: Oral     SpO2: 100%  100%   Weight:      Height:  Eyes: PERRL, lids and conjunctivae normal ENMT: Mucous membranes are moist. Posterior pharynx clear of any exudate or lesions. Neck: normal, supple, no masses, no thyromegaly Respiratory: clear to auscultation bilaterally, no wheezing, no crackles. Normal respiratory effort. No accessory muscle use.  Cardiovascular: Regular rate and rhythm, no murmurs / rubs / gallops. No extremity edema. 2+ pedal pulses. No carotid bruits.  Abdomen: no tenderness, no masses palpated. No hepatosplenomegaly. Bowel sounds positive.  Neurologic: CN 2-12 grossly intact.  Able to move all extremities Psychiatric: Normal judgment and insight. Alert and oriented x 3. Normal mood.   Labs on Admission:  Basic Metabolic Panel: Recent Labs  Lab 10/08/21 0851 10/09/21 0835  NA 136 133*  K 3.9 4.7  CL 97* 95*  CO2 25 25  GLUCOSE 216* 172*  BUN 69* 87*  CREATININE 8.89* 8.91*  CALCIUM 8.2* 7.6*  MG 2.2  --   PHOS 6.2* 7.6*   Liver Function Tests: Recent Labs  Lab 10/08/21 0851 10/09/21 0835  AST 15  --   ALT 31  --   ALKPHOS 49  --   BILITOT 0.8  --   PROT 5.6*  --   ALBUMIN 2.9* 2.5*   No results  for input(s): LIPASE, AMYLASE in the last 168 hours. No results for input(s): AMMONIA in the last 168 hours. CBC: Recent Labs  Lab 10/08/21 0851  WBC 11.4*  NEUTROABS 8.2*  HGB 7.7*  HCT 22.9*  MCV 93.5  PLT 246   Cardiac Enzymes: No results for input(s): CKTOTAL, CKMB, CKMBINDEX, TROPONINI in the last 168 hours. BNP: Invalid input(s): POCBNP CBG: Recent Labs  Lab 10/08/21 2206 10/09/21 0632 10/09/21 0651 10/09/21 0730 10/09/21 1157  GLUCAP 147* 54* 61* 96 103*    Radiological Exams on Admission: Overnight EEG with video  Result Date: 10/09/2021 Lora Havens, MD     10/09/2021 10:27 AM Patient Name: Felicia Thompson MRN: [id number] Epilepsy Attending: Lora Havens Referring Physician/Provider: Dr. Zeb Comfort Duration: 10/08/2021 0846 to 10/09/2021 0846 Patient history: 75yo F with episodes of dizziness with alteration of awareness and fall.  EEG to evaluate for seizures. Level of alertness: Awake, asleep AEDs during EEG study: None Technical aspects: This EEG study was done with scalp electrodes positioned according to the 10-20 International system of electrode placement. Electrical activity was acquired at a sampling rate of 500Hz  and reviewed with a high frequency filter of 70Hz  and a low frequency filter of 1Hz . EEG data were recorded continuously and digitally stored. Description: The posterior dominant rhythm consists of 9 Hz activity of moderate voltage (25-35 uV) seen predominantly in posterior head regions, symmetric and reactive to eye opening and eye closing. Sleep was characterized by vertex waves, sleep spindles (12 to 14 Hz), maximal frontocentral region. Hyperventilation and photic stimulation were not performed.   IMPRESSION: This study is within normal limits. No seizures or epileptiform discharges were seen throughout the recording. [place]      Time spent: >45 minutes  [facility]   If 7PM-7AM, please contact  night-coverage

## 2021-10-09 NOTE — Progress Notes (Signed)
Subjective: Had episodes of hypoglycemia overnight.  No episodes of dizziness or syncopal events.  ROS: negative except above  Examination  Vital signs in last 24 hours: Temp:  [97.9 F (36.6 C)-98.7 F (37.1 C)] 98 F (36.7 C) (11/08 1500) Pulse Rate:  [73-87] 73 (11/08 0821) Resp:  [12-20] 18 (11/08 1500) BP: (122-145)/(53-69) 128/58 (11/08 1500) SpO2:  [98 %-100 %] 100 % (11/08 1500)  General: lying in bed, NAD CVS: pulse-normal rate and rhythm RS: breathing comfortably Extremities: normal   Neuro: MS: Alert, oriented, follows commands CN: pupils equal and reactive,  EOMI, face symmetric, tongue midline, normal sensation over face, Motor: 5/5 strength in all 4 extremities Reflexes: 2+ bilaterally over patella, biceps, plantars: flexor Coordination: normal Gait: not tested  Basic Metabolic Panel: Recent Labs  Lab 10/08/21 0851 10/09/21 0835  NA 136 133*  K 3.9 4.7  CL 97* 95*  CO2 25 25  GLUCOSE 216* 172*  BUN 69* 87*  CREATININE 8.89* 8.91*  CALCIUM 8.2* 7.6*  MG 2.2  --   PHOS 6.2* 7.6*    CBC: Recent Labs  Lab 10/08/21 0851  WBC 11.4*  NEUTROABS 8.2*  HGB 7.7*  HCT 22.9*  MCV 93.5  PLT 246     Coagulation Studies: Recent Labs    10/08/21 0851  LABPROT 14.3  INR 1.1    Imaging No new brain imaging overnight   ASSESSMENT AND PLAN 75yo F with episodes of dizziness with alteration of awareness and fall.    Transient alteration of awareness Dizziness Syncope -These episodes are most likely due to orthostasis and anemia -Continue vEEG for characterization of spells - Not on Aeds at home - will perform HV and photic today - continue seizure precautions  Orthostatic hypotension -Patient's blood pressure dropped from 152/66 (lying) to 136/60 (sitting) and 123/54 (standing).  Heart rate increased from 79 (lying and sitting) to 90 (standing) -Consulted nephrology, appreciate their input and recommendations  End-stage renal disease on  peritoneal dialysis - Consulted nephrology  Hypertension - Reduce losartan to 25 mg daily per nephrology.  Anemia - Nephrology recommended transfusing PRBCs 1 unit  Diabetes -Has had periods of hypoglycemia overnight -Consult medicine for further recommendations  I have spent a total of 40  minutes with the patient reviewing hospital notes,  test results, labs and examining the patient as well as establishing an assessment and plan that was discussed personally with the patient and coordinating care with consultants. 50% of time was spent in direct patient care.    Zeb Comfort Epilepsy [facility] For questions after 5pm please refer to AMION to reach the Neurologist on call

## 2021-10-09 NOTE — Plan of Care (Signed)
°  Problem: Education: °Goal: Knowledge of General Education information will improve °Description: Including pain rating scale, medication(s)/side effects and non-pharmacologic comfort measures °Outcome: Progressing °  °Problem: Clinical Measurements: °Goal: Ability to maintain clinical measurements within normal limits will improve °Outcome: Progressing °Goal: Will remain free from infection °Outcome: Progressing °  °Problem: Activity: °Goal: Risk for activity intolerance will decrease °Outcome: Progressing °  °Problem: Nutrition: °Goal: Adequate nutrition will be maintained °Outcome: Progressing °  °Problem: Coping: °Goal: Level of anxiety will decrease °Outcome: Progressing °  °Problem: Elimination: °Goal: Will not experience complications related to bowel motility °Outcome: Progressing °  °Problem: Pain Managment: °Goal: General experience of comfort will improve °Outcome: Progressing °  °Problem: Safety: °Goal: Ability to remain free from injury will improve °Outcome: Progressing °  °

## 2021-10-09 NOTE — Progress Notes (Signed)
Hypoglycemic Event  CBG 54 at 0632, glucerna given, repeated at 0651, read 61  Treatment: Glucerna drink and breakfast    Symptoms: None  Follow-up CBG: Time: 0730 CBG Result  96  Possible Reasons for Event: Unknown  Comments/MD notified Dr Colman Cater, Kimber Relic

## 2021-10-09 NOTE — Consult Note (Addendum)
[facility] Renal Consultation Note  Requesting MD: Dr. Hortense Ramal Indication for Consultation:  Peritoneal Dialysis   Chief complaint: Dizziness  HPI:  Felicia Thompson is a 75 y.o. female with a past medical history of type 2 diabetes mellitus with peripheral neuropathy, ESRD on peritoneal dialysis, hypertension, anxiety/depression who was admitted to the neurology service for work up on ongoing dizziness and spells of transient alteration of awareness.   Notes that she has been having intermittent episodes where she loses consciousness. The most recent occurred on Sunday morning. States it typically occurs when she gets up or when it is really hot outside. She "sometimes remembers" and sometimes does not have recollection of the event. She will occasionally lose control of her bowels during this time. They happen about every week or so, but there was one week where they occurred almost every other day. She notes that these episodes started in February, the same time that she started on dialysis.   She receives peritoneal dialysis with [facility] in [place].   Her last peritoneal dialysis session was on Sunday night. She reports she does 4 cycles over 8-9 hours. She reports she uses a combination of 4.25/2.5/1.5 bags according to her weight and blood pressures. She typically empties out the first bag and about a quarter of the second bag.   She feels well this morning. Denies any swelling, nausea, loss of appetite or metallic taste in her mouth.  No chest pain, shortness of breath, abdominal pain. Drainage from her catheter has not been abnormal.   Creatinine, Ser  Date/Time Value Ref Range Status  10/08/2021 08:51 AM 8.89 (H) 0.44 - 1.00 mg/dL Final  09/03/2021 01:07 PM 9.37 (H) 0.44 - 1.00 mg/dL Final  05/15/2021 02:40 PM 6.41 (HH) 0.40 - 1.20 mg/dL Final   PMHx:   Past Medical History:  Diagnosis Date   Arthritis    Blood in stool    when  appendix burst   Chronic kidney disease    Depression    DM (diabetes mellitus), type 2 with complications ([place])    ESRD on peritoneal dialysis ([place])    History of fainting spells of unknown cause    Hyperlipidemia    Hyperlipidemia associated with type 2 diabetes mellitus ([place])    Hypertension    OSA (obstructive sleep apnea)     Past Surgical History:  Procedure Laterality Date   ABDOMINAL HYSTERECTOMY     APPENDECTOMY     CHOLECYSTECTOMY     TONSILECTOMY, ADENOIDECTOMY, BILATERAL MYRINGOTOMY AND TUBES      Family Hx:  Family History  Problem Relation Age of Onset   Hearing loss Father    Hyperlipidemia Father    Heart disease Father    Early death Father    Cancer Father    Early death Brother    Drug abuse Brother    Diabetes Brother    Depression Brother    Alcohol abuse Brother    Miscarriages / [place] Daughter    Arthritis Daughter    Depression Daughter    Early death Maternal Grandmother    Heart attack Maternal Grandmother    Heart disease Maternal Grandmother    Hearing loss Paternal Grandmother    Heart disease Paternal Grandmother    Heart disease Paternal Grandfather    Early death Paternal Grandfather    Heart attack Paternal Grandfather     Social History:  reports that she has never smoked. She has never used smokeless tobacco. She  reports that she does not drink alcohol and does not use drugs.  Allergies:  Allergies  Allergen Reactions   Tape Other (See Comments)    Blisters   Tetracyclines & Related Rash    Medications: Prior to Admission medications   Medication Sig Start Date End Date Taking? Authorizing Provider  aspirin 81 MG chewable tablet Chew 81 mg by mouth daily.   Yes [provider]  atorvastatin (LIPITOR) 80 MG tablet Take 1 tablet (80 mg total) by mouth daily. 05/15/21  Yes Isaac Bliss, Rayford Halsted, MD  carvedilol (COREG) 25 MG tablet Take 25 mg by mouth 2 (two) times daily with a meal.   Yes [provider]  glipiZIDE (GLUCOTROL) 5 MG tablet Take 5 mg by mouth with breakfast, with lunch, and with evening meal.   Yes [provider]  insulin NPH-regular Human (HUMULIN 70/30) (70-30) 100 UNIT/ML injection Inject 10 Units into the skin daily with breakfast. 08/17/21  Yes Renato Shin, MD  losartan (COZAAR) 50 MG tablet Take 25 mg by mouth 2 (two) times daily.   Yes [provider]  Multiple Vitamins-Minerals (RENAPLEX) TABS Take 1 tablet by mouth daily. 08/30/21  Yes [provider]  Probiotic Product ([employer]S ADV PROBIOTIC GUMMIES PO) Take 1 tablet by mouth at bedtime.   Yes [provider]  RENVELA 800 MG tablet Take 1,600 mg by mouth 3 (three) times daily with meals. 08/30/21  Yes [provider]  venlafaxine XR (EFFEXOR-XR) 150 MG 24 hr capsule Take 150 mg by mouth daily with breakfast.   Yes [provider]  vitamin B-12 (CYANOCOBALAMIN) 1000 MCG tablet Take 1,000 mcg by mouth every other day.   Yes [provider]  Continuous Blood Gluc Sensor (FREESTYLE LIBRE 2 SENSOR) MISC 1 Device by Does not apply route every 14 (fourteen) days. 08/15/21   Renato Shin, MD    I have reviewed the patient's current medications.  Labs:  BMP Latest Ref Rng & Units 10/08/2021 09/03/2021 05/15/2021  Glucose 70 - 99 mg/dL 216(H) 166(H) 558(HH)  BUN 8 - 23 mg/dL 69(H) 76(H) 81(H)  Creatinine 0.44 - 1.00 mg/dL 8.89(H) 9.37(H) 6.41(HH)  Sodium 135 - 145 mmol/L 136 137 130(L)  Potassium 3.5 - 5.1 mmol/L 3.9 3.8 3.7  Chloride 98 - 111 mmol/L 97(L) 99 91(L)  CO2 22 - 32 mmol/L 25 25 28   Calcium 8.9 - 10.3 mg/dL 8.2(L) 8.4(L) 8.0(L)     ROS:  Pertinent items are noted in HPI.  Physical Exam: Vitals:   10/09/21 0425 10/09/21 0821  BP: (!) 122/53 (!) 136/58  Pulse: 73 73  Resp: 18   Temp: 97.9 F (36.6 C)   SpO2: 99%     General: Awake, alert, oriented, in no acute distress, pleasant and cooperative with examination.  HEENT:  Normocephalic, atraumatic, nares patent, EEG leads on head Cardio: RRR without murmur, 2+ radial pulse b/l Respiratory: CTAB without wheezing/rhonchi/rales Abdomen: Soft, non-tender to palpation of all quadrants, non-distended, no rebound/guarding MSK: Able to move all extremities spontaneously, good muscle strength, no abnormalities Extremities: SCD's in place b/l, no edema  Neuro: Speech is clear and intact, no focal deficits, no facial asymmetry, follows commands  Psych: Normal mood and affect  Assessment/Plan: Felicia Thompson is a 75 y.o. female who was admitted to the neurology service for work-up of her ongoing dizziness and episodes of LOC. Nephrology has been consulted for management of ESRD as patient is on peritoneal dialysis.   ESRD on PD: Slight  hyponatremia at 133, potassium is stable. Phosphorus elevated to 7.6 and BUN elevated at 87. Creatinine 8.89>8.91. Patient is saturating on room air with no signs of edema. Will obtain dialysis orders from [facility] to continue inpatient PD.   Anemia of Chronic Disease: Hgb 7.7. Anemia could be contributing to dizzines/orthostasis. Recommend 1U PRBC transfusion. States that she received EPO injections recently.   Transient Alteration of Awareness: Workup per primary.  HTN: BP stable. Given concern for possible hypotensive episodes contributing to episodes, will decrease Losartan to 25 mg daily  T2DM: One low CBG of 54 this AM that resolved with Glucerna. Management per primary.   Depression: home venlafaxine per primary team    Sharion Settler 10/09/2021, 8:37 AM    Seen and examined independently.  Agree with note and exam as documented above by Dr. Nita Sells and as noted here.  Adult female ESRD on PD followed by [employer] who started PD earlier this year presenting with presycnope/syncopal epsidoes.  Over the weekend she had a dizziness episode with nausea.  She thinks episodes started before PD - hard to  remember.  Her daughter joined via phone and is concerned that she sometimes may choose her PD bags before knowing her weight.  BP dropped 23-76 points systolic on standing per nursing.  Patient states that once a week she uses 4.25% dextrose.  States she got EPO last week for Hb of 8.  She provides the number for her PD RN of [place] at [phone].  The patient does 4 cycles over 8-9 hours.  She states BP at home fluctuates - sometimes 110's and sometimes up to 160.  She was asked to hold losartan outpatient if systolic under 073  General adult female in bed in no acute distress HEENT normocephalic atraumatic extraocular movements intact sclera anicteric Neck supple trachea midline Lungs clear to auscultation bilaterally normal work of breathing at rest  Heart regular rate and rhythm no rubs or gallops appreciated Abdomen soft nontender nondistended Extremities no edema  Psych normal mood and affect Access tenckhoff catheter bandaged and clean/dry/intact  ESRD on PD - PD tonight with a mix of 1.5 and 2.5 % dextrose.   - Have counseled patient to use fewer 4.25% dextrose bags at home  - I reached out to [facility] and wasn't able to reach but left a voicemail to call me back about mutual patient.   Dizziness - pre-sycnopal and syncopal episodes felt 2/2 fluid depletion as well as in setting of slightly worsening chronic anemia   Anemia CKD  - Would transfuse PRBC's 1 unit - we discussed risks/benefits/indications for transfusion and that patient does agree.   - states she got EPO last thursday  HTN  - reduce losartan to 25 mg daily - appears she may be basing fluids for PD on BP more than weight (thus leading to higher fluid losses/UF) so hesitant to stop losartan yet though do agree with outpatient hold parameters  T2DM per primary team   Secondary hyperparathyroidism /metabolic bone disease - continue home renvela - not on calcitriol   Claudia Desanctis, MD 10/09/2021  11:36  AM  Spoke with PD RN.    4 exchanges of 2 liters with 1.5 hour dwell  EDW is reported as 81 kg  Mircera 225 mcg on Wednesday, 11/2 She follows with Dr. Juleen China   Claudia Desanctis, MD 10/09/2021 11:52 AM

## 2021-10-10 DIAGNOSIS — Z992 Dependence on renal dialysis: Secondary | ICD-10-CM | POA: Diagnosis not present

## 2021-10-10 DIAGNOSIS — Z23 Encounter for immunization: Secondary | ICD-10-CM | POA: Diagnosis not present

## 2021-10-10 DIAGNOSIS — R82998 Other abnormal findings in urine: Secondary | ICD-10-CM | POA: Diagnosis not present

## 2021-10-10 DIAGNOSIS — Z79899 Other long term (current) drug therapy: Secondary | ICD-10-CM | POA: Diagnosis not present

## 2021-10-10 DIAGNOSIS — R404 Transient alteration of awareness: Secondary | ICD-10-CM | POA: Diagnosis present

## 2021-10-10 DIAGNOSIS — D509 Iron deficiency anemia, unspecified: Secondary | ICD-10-CM | POA: Diagnosis not present

## 2021-10-10 DIAGNOSIS — D631 Anemia in chronic kidney disease: Secondary | ICD-10-CM | POA: Diagnosis not present

## 2021-10-10 DIAGNOSIS — N186 End stage renal disease: Secondary | ICD-10-CM | POA: Diagnosis not present

## 2021-10-10 DIAGNOSIS — N2581 Secondary hyperparathyroidism of renal origin: Secondary | ICD-10-CM | POA: Diagnosis not present

## 2021-10-10 DIAGNOSIS — E875 Hyperkalemia: Secondary | ICD-10-CM | POA: Diagnosis not present

## 2021-10-10 LAB — BPAM RBC
Blood Product Expiration Date: 202211122359
ISSUE DATE / TIME: 202211081509
Unit Type and Rh: 600

## 2021-10-10 LAB — CBC
HCT: 25.2 % — ABNORMAL LOW (ref 36.0–46.0)
Hemoglobin: 8.8 g/dL — ABNORMAL LOW (ref 12.0–15.0)
MCH: 31.1 pg (ref 26.0–34.0)
MCHC: 34.9 g/dL (ref 30.0–36.0)
MCV: 89 fL (ref 80.0–100.0)
Platelets: 204 10*3/uL (ref 150–400)
RBC: 2.83 MIL/uL — ABNORMAL LOW (ref 3.87–5.11)
RDW: 17 % — ABNORMAL HIGH (ref 11.5–15.5)
WBC: 6.9 10*3/uL (ref 4.0–10.5)
nRBC: 0 % (ref 0.0–0.2)

## 2021-10-10 LAB — GLUCOSE, CAPILLARY
Glucose-Capillary: 229 mg/dL — ABNORMAL HIGH (ref 70–99)
Glucose-Capillary: 122 mg/dL — ABNORMAL HIGH (ref 70–99)
Glucose-Capillary: 126 mg/dL — ABNORMAL HIGH (ref 70–99)
Glucose-Capillary: 207 mg/dL — ABNORMAL HIGH (ref 70–99)

## 2021-10-10 LAB — BASIC METABOLIC PANEL
Anion gap: 13 (ref 5–15)
BUN: 84 mg/dL — ABNORMAL HIGH (ref 8–23)
CO2: 25 mmol/L (ref 22–32)
Calcium: 7.8 mg/dL — ABNORMAL LOW (ref 8.9–10.3)
Chloride: 96 mmol/L — ABNORMAL LOW (ref 98–111)
Creatinine, Ser: 7.97 mg/dL — ABNORMAL HIGH (ref 0.44–1.00)
GFR, Estimated: 5 mL/min — ABNORMAL LOW (ref >60)
Glucose, Bld: 197 mg/dL — ABNORMAL HIGH (ref 70–99)
Potassium: 4.6 mmol/L (ref 3.5–5.1)
Sodium: 134 mmol/L — ABNORMAL LOW (ref 135–145)

## 2021-10-10 LAB — TYPE AND SCREEN
ABO/RH(D): A POS
Antibody Screen: NEGATIVE
Unit division: 0

## 2021-10-10 MED ORDER — DELFLEX-LC/1.5% DEXTROSE 344 MOSM/L IP SOLN
INTRAPERITONEAL | Status: DC
  Administered 2021-10-10: 5000 mL via INTRAPERITONEAL

## 2021-10-10 MED ORDER — GLIPIZIDE 5 MG PO TABS: 5.0000 mg | ORAL_TABLET | Freq: Two times a day (BID) | ORAL | Status: DC

## 2021-10-10 MED ORDER — LOSARTAN POTASSIUM 50 MG PO TABS
50.0000 mg | ORAL_TABLET | Freq: Every day | ORAL | Status: DC
  Administered 2021-10-11 – 2021-10-12 (×2): 50 mg via ORAL
  Filled 2021-10-10 (×2): qty 1

## 2021-10-10 MED ORDER — GENTAMICIN SULFATE 0.1 % EX CREA: 1.0000 "application " | TOPICAL_CREAM | Freq: Every day | CUTANEOUS | Status: DC

## 2021-10-10 MED ORDER — LOSARTAN POTASSIUM 25 MG PO TABS
25.0000 mg | ORAL_TABLET | Freq: Once | ORAL | Status: AC
  Administered 2021-10-10: 25 mg via ORAL
  Filled 2021-10-10: qty 1

## 2021-10-10 MED ORDER — DELFLEX-LC/2.5% DEXTROSE 394 MOSM/L IP SOLN: INTRAPERITONEAL | Status: DC

## 2021-10-10 MED ORDER — DELFLEX-LC/2.5% DEXTROSE 394 MOSM/L IP SOLN
INTRAPERITONEAL | Status: DC
  Administered 2021-10-10: 5000 mL via INTRAPERITONEAL

## 2021-10-10 MED ORDER — DELFLEX-LC/1.5% DEXTROSE 344 MOSM/L IP SOLN: INTRAPERITONEAL | Status: DC

## 2021-10-10 NOTE — Progress Notes (Signed)
EEG maintenance performed.  No skin breakdown observed at electrode sites Fp1, Fp2. 

## 2021-10-10 NOTE — Progress Notes (Signed)
[facility] PROGRESS NOTE   Akeela Busk MOQ:[id number] DOB: [birth year] DOA: 10/08/2021  PCP: Isaac Bliss, Rayford Halsted, MD  Brief History/Interval Summary: 75 y.o. female with medical history significant of HTN, HLD, ESRD on PD, DM type II with neuropathy, who was admitted to hospital under neurology service with complaints of "dizzy" spells.  She was hospitalized for further evaluation for possible seizures.  Hospitalist was consulted due to episodes of hypoglycemia.      Subjective/Interval History: Patient mentions that she feels well this morning.  Denies any chest pain or shortness of breath.  She is running out of her glucose recordings from the last month or so.  Has not had any significant hypoglycemia even after she was started on 70/30 insulin by her endocrinologist.     Assessment/Plan:  Hypoglycemia in the setting of diabetes mellitus type 2 on insulin HbA1c 9.0. About 2 months ago she was referred to endocrinology and was seen by Dr. Loanne Drilling.  At that time she was started on 70/30 insulin in addition to the glipizide 10mg  which she was already taking 3 times a day. Noted to have hypoglycemic episode yesterday morning.  She showed me all of her glucose readings from the last month.  Has not had any significant hypoglycemia even after initiation of 70/30 insulin.  Unclear if yesterday's episode was due to poor oral intake.  Her glipizide has been discontinued.  One option may be to decrease her glipizide to once or twice a day or could consider decreasing the dose of glipizide to 5 mg 2 times a day.  This was discussed with the patient.  Will likely go with the latter option. She will need to continue to maintain CBG logs and follow-up with her PCP/endocrinologist. CBGs have been stable for the last 24 hours.  Transient altered awareness Neurology is managing.  Currently on continuous EEG monitoring.  Orthostatic hypotension Thought to be responsible for at  least some of her symptoms.  End-stage renal disease on peritoneal dialysis Nephrology is following.  Anemia of chronic kidney disease Stable. She was transfused 1 unit of PRBC yesterday.     Medications: Scheduled:  aspirin  81 mg Oral Daily   atorvastatin  80 mg Oral Daily   carvedilol  25 mg Oral BID WC   Chlorhexidine Gluconate Cloth  6 each Topical q morning   enoxaparin (LOVENOX) injection  30 mg Subcutaneous Q24H   gentamicin cream  1 application Topical Daily   insulin aspart  0-5 Units Subcutaneous QHS   insulin aspart  0-6 Units Subcutaneous TID WC   insulin aspart protamine- aspart  10 Units Subcutaneous Q breakfast   losartan  25 mg Oral Once   [START ON 10/11/2021] losartan  50 mg Oral Daily   sevelamer carbonate  1,600 mg Oral TID WC   venlafaxine XR  150 mg Oral Daily   Continuous:  dialysis solution 1.5% low-MG/low-CA     dialysis solution 2.5% low-MG/low-CA     PTW:[id number] **OR** acetaminophen, dextrose, labetalol, LORazepam  Antibiotics: Anti-infectives (From admission, onward)    None       Objective:  Vital Signs  Vitals:   10/10/21 0916 10/10/21 1024 10/10/21 1047 10/10/21 1152  BP: (!) 164/70 (!) 141/63  (!) 153/71  Pulse: 76 (!) 103  74  Resp: 15 18  15   Temp:  98.4 F (36.9 C)  98.5 F (36.9 C)  TempSrc:  Oral  Oral  SpO2: 97% 100%  100%  Weight:  84.1  kg 83.7 kg   Height:        Intake/Output Summary (Last 24 hours) at 10/10/2021 1222 Last data filed at 10/10/2021 1046 Gross per 24 hour  Intake 1185 ml  Output 200 ml  Net 985 ml   Filed Weights   10/09/21 2130 10/10/21 1024 10/10/21 1047  Weight: 82.1 kg 84.1 kg 83.7 kg    General appearance: Awake alert.  In no distress Resp: Clear to auscultation bilaterally.  Normal effort Cardio: S1-S2 is normal regular.  No S3-S4.  No rubs murmurs or bruit GI: Abdomen is soft.  Nontender nondistended.  Bowel sounds are present normal.  No masses organomegaly    Lab  Results:  Data Reviewed: I have personally reviewed following labs and imaging studies  CBC: Recent Labs  Lab 10/08/21 0851 10/10/21 0852  WBC 11.4* 6.9  NEUTROABS 8.2*  --   HGB 7.7* 8.8*  HCT 22.9* 25.2*  MCV 93.5 89.0  PLT 246 530    Basic Metabolic Panel: Recent Labs  Lab 10/08/21 0851 10/09/21 0835 10/10/21 0852  NA 136 133* 134*  K 3.9 4.7 4.6  CL 97* 95* 96*  CO2 25 25 25   GLUCOSE 216* 172* 197*  BUN 69* 87* 84*  CREATININE 8.89* 8.91* 7.97*  CALCIUM 8.2* 7.6* 7.8*  MG 2.2  --   --   PHOS 6.2* 7.6*  --     GFR: Estimated Creatinine Clearance: 7 mL/min (A) (by C-G formula based on SCr of 7.97 mg/dL (H)).  Liver Function Tests: Recent Labs  Lab 10/08/21 0851 10/09/21 0835  AST 15  --   ALT 31  --   ALKPHOS 49  --   BILITOT 0.8  --   PROT 5.6*  --   ALBUMIN 2.9* 2.5*     Coagulation Profile: Recent Labs  Lab 10/08/21 0851  INR 1.1    CBG: Recent Labs  Lab 10/09/21 1157 10/09/21 1544 10/09/21 2108 10/10/21 0622 10/10/21 1150  GLUCAP 103* 121* 153* 229* 122*     Radiology Studies: Overnight EEG with video  Result Date: 10/09/2021 Lora Havens, MD     10/09/2021 10:27 AM Patient Name: Nhyira Leano MRN: [id number] Epilepsy Attending: Lora Havens Referring Physician/Provider: Dr. Zeb Comfort Duration: 10/08/2021 0846 to 10/09/2021 0846 Patient history: 75yo F with episodes of dizziness with alteration of awareness and fall.  EEG to evaluate for seizures. Level of alertness: Awake, asleep AEDs during EEG study: None Technical aspects: This EEG study was done with scalp electrodes positioned according to the 10-20 International system of electrode placement. Electrical activity was acquired at a sampling rate of 500Hz  and reviewed with a high frequency filter of 70Hz  and a low frequency filter of 1Hz . EEG data were recorded continuously and digitally stored. Description: The posterior dominant rhythm consists of 9 Hz activity  of moderate voltage (25-35 uV) seen predominantly in posterior head regions, symmetric and reactive to eye opening and eye closing. Sleep was characterized by vertex waves, sleep spindles (12 to 14 Hz), maximal frontocentral region. Hyperventilation and photic stimulation were not performed.   IMPRESSION: This study is within normal limits. No seizures or epileptiform discharges were seen throughout the recording. [place]       LOS: 2 days   Ryleigh Buenger [employer] Pager on [email or web address]  10/10/2021, 12:22 PM

## 2021-10-10 NOTE — Progress Notes (Signed)
Subjective: No episodes overnight.  States that she thinks her episodes happen because she stands up too quickly.  ROS: negative except above  Examination  Vital signs in last 24 hours: Temp:  [98 F (36.7 C)-98.7 F (37.1 C)] 98.5 F (36.9 C) (11/09 1152) Pulse Rate:  [70-103] 74 (11/09 1152) Resp:  [11-18] 15 (11/09 1152) BP: (126-179)/(60-75) 153/71 (11/09 1152) SpO2:  [97 %-100 %] 100 % (11/09 1152) Weight:  [82.1 kg-84.1 kg] 83.7 kg (11/09 1047)  General: lying in bed, NAD CVS: pulse-normal rate and rhythm RS: breathing comfortably Extremities: normal    Neuro: MS: Alert, oriented, follows commands CN: pupils equal and reactive,  EOMI, face symmetric, tongue midline, normal sensation over face, Motor: 5/5 strength in all 4 extremities Reflexes: 2+ bilaterally over patella, biceps, plantars: flexor Coordination: normal Gait: not tested  Basic Metabolic Panel: Recent Labs  Lab 10/08/21 0851 10/09/21 0835 10/10/21 0852  NA 136 133* 134*  K 3.9 4.7 4.6  CL 97* 95* 96*  CO2 25 25 25   GLUCOSE 216* 172* 197*  BUN 69* 87* 84*  CREATININE 8.89* 8.91* 7.97*  CALCIUM 8.2* 7.6* 7.8*  MG 2.2  --   --   PHOS 6.2* 7.6*  --     CBC: Recent Labs  Lab 10/08/21 0851 10/10/21 0852  WBC 11.4* 6.9  NEUTROABS 8.2*  --   HGB 7.7* 8.8*  HCT 22.9* 25.2*  MCV 93.5 89.0  PLT 246 204     Coagulation Studies: Recent Labs    10/08/21 0851  LABPROT 14.3  INR 1.1    Imaging  No new brain imaging overnight     ASSESSMENT AND PLAN 75yo F with episodes of dizziness with alteration of awareness and fall.    Transient alteration of awareness Dizziness Syncope -These episodes are most likely due to orthostasis and anemia -Continue vEEG for characterization of spells - continue seizure precautions -If no further episodes, will consider discharging patient tomorrow  Orthostatic hypotension -Patient's blood pressure dropped from 152/66 (lying) to 136/60 (sitting) and  123/54 (standing).  Heart rate increased from 79 (lying and sitting) to 90 (standing) -Consulted nephrology, appreciate their input and recommendations   End-stage renal disease on peritoneal dialysis -On peritoneal dialysis.  Appreciate nephrology assistance   Hypertension - Reduced losartan to 25 mg daily per nephrology.   Anemia - Nephrology recommended transfusing PRBCs 1 unit -  Hemoglobin improved   Diabetes -No further episodes of hypoglycemia.  Appreciate medicine assistance  I have spent a total of 26 minutes with the patient reviewing hospital notes,  test results, labs and examining the patient as well as establishing an assessment and plan that was discussed personally with the patient. 50% of time was spent in direct patient care.  Zeb Comfort Epilepsy [facility] For questions after 5pm please refer to AMION to reach the Neurologist on call

## 2021-10-10 NOTE — Progress Notes (Addendum)
[facility] Progress Note  Name: Felicia Thompson MRN: [id number] DOB: 02-16-1946  Chief Complaint:  ESRD on PD   Subjective:  Ms. Felicia Thompson feels well this morning. She tolerated PD well last night. She denies feeling dizzy or lightheaded. She states that she got up to go to the bathroom twice yesterday and did not feel lightheaded but notes she sat at the edge of the bed for a while before getting up. She thinks she may be getting up too fast at home causing some of her symptoms. She tolerated the transfusion well. Does not feel edematous.   Review of systems:  ROS per HPI.    Intake/Output Summary (Last 24 hours) at 10/10/2021 0821 Last data filed at 10/09/2021 1830 Gross per 24 hour  Intake 1305 ml  Output --  Net 1305 ml    Vitals:  Vitals:   10/09/21 2028 10/09/21 2130 10/10/21 0010 10/10/21 0410  BP: 140/61  (!) 163/64 132/60  Pulse: 78  80 70  Resp: 18  17 11   Temp: 98.2 F (36.8 C)  98.1 F (36.7 C) 98.5 F (36.9 C)  TempSrc: Oral  Oral Oral  SpO2: 98%  98% 99%  Weight:  82.1 kg    Height:         Physical Exam:  General: Awake, alert, oriented, in no acute distress, pleasant and cooperative with examination HEENT: Normocephalic, atraumatic, nares patent, EEG leads on head Cardio: RRR without murmur, 2+ radial, DP pulses b/l Respiratory: CTAB without wheezing/rhonchi/rales Abdomen: soft, overweight, normal active bowel sounds Neuro: Speech is clear and intact, no focal deficits, no facial asymmetry, follows commands  Psych: Normal mood and affect  Medications reviewed   Labs:  BMP Latest Ref Rng & Units 10/09/2021 10/08/2021 09/03/2021  Glucose 70 - 99 mg/dL 172(H) 216(H) 166(H)  BUN 8 - 23 mg/dL 87(H) 69(H) 76(H)  Creatinine 0.44 - 1.00 mg/dL 8.91(H) 8.89(H) 9.37(H)  Sodium 135 - 145 mmol/L 133(L) 136 137  Potassium 3.5 - 5.1 mmol/L 4.7 3.9 3.8  Chloride 98 - 111 mmol/L 95(L) 97(L) 99  CO2 22 - 32 mmol/L 25 25 25   Calcium 8.9 - 10.3  mg/dL 7.6(L) 8.2(L) 8.4(L)     Assessment/Plan:  Felicia Thompson is a 75 y.o. female who was admitted to the neurology service for work-up of her ongoing dizziness and episodes of LOC- now thought to be related to anemia and orthostatic hypotension.     ESRD on PD: Received PD yesterday. BMP ordered this AM, still awaiting results.    Anemia of Chronic Disease: Hgb 7.7. Received 1U PRBC transfusion yesterday. CBC ordered this AM, still awaiting results.  Secondary hyperparathyroidism/Metabolic Bone disease: Continue home renvela.   Orthostatic Hypotension  HTN: Losartan decreased to 25 mg daily. Continues on Carvedilol 25 mg BID. Systolic BP ranging 102V-253G. Received one dose of IV labetolol this morning for a systolic pressure >644.    T2DM: Management per consulting medicine team- Glipizide held.    Depression: home venlafaxine per primary team     Sharion Settler, DO 10/10/2021 8:21 AM   Seen and examined independently.  Agree with note and exam as documented above by Dr. Nita Sells and as noted here.  ESRD on PD  - PD - continue mix of 1.5 and 2.5% dextrose  Anemia of CKD - improved with PRBC's  Orthostatic hypotension  - s/p PRBC's and have decreased UF with PD with improvement  HTN  - Increase losartan to home dose of 50 mg  daily   Disposition per primary team Claudia Desanctis, MD 10/10/2021 11:13 AM

## 2021-10-10 NOTE — Procedures (Signed)
Patient Name: Felicia Thompson  MRN: [id number]  Epilepsy Attending: Lora Havens  Referring Physician/Provider: Dr. Zeb Comfort Duration: 10/09/2021 0846 to 10/10/2021 0846   Patient history: 75yo F with episodes of dizziness with alteration of awareness and fall.  EEG to evaluate for seizures.   Level of alertness: Awake, asleep   AEDs during EEG study: None   Technical aspects: This EEG study was done with scalp electrodes positioned according to the 10-20 International system of electrode placement. Electrical activity was acquired at a sampling rate of 500Hz  and reviewed with a high frequency filter of 70Hz  and a low frequency filter of 1Hz . EEG data were recorded continuously and digitally stored.    Description: The posterior dominant rhythm consists of 9 Hz activity of moderate voltage (25-35 uV) seen predominantly in posterior head regions, symmetric and reactive to eye opening and eye closing. Sleep was characterized by vertex waves, sleep spindles (12 to 14 Hz), maximal frontocentral region.   Physiology photic driving was seen during photic stimulation.  No EEG change was seen during hyperventilation.   IMPRESSION: This study is within normal limits. No seizures or epileptiform discharges were seen throughout the recording.   [name] [name]

## 2021-10-11 DIAGNOSIS — D631 Anemia in chronic kidney disease: Secondary | ICD-10-CM | POA: Diagnosis not present

## 2021-10-11 DIAGNOSIS — Z992 Dependence on renal dialysis: Secondary | ICD-10-CM | POA: Diagnosis not present

## 2021-10-11 DIAGNOSIS — R82998 Other abnormal findings in urine: Secondary | ICD-10-CM | POA: Diagnosis not present

## 2021-10-11 DIAGNOSIS — Z23 Encounter for immunization: Secondary | ICD-10-CM | POA: Diagnosis not present

## 2021-10-11 DIAGNOSIS — N2581 Secondary hyperparathyroidism of renal origin: Secondary | ICD-10-CM | POA: Diagnosis not present

## 2021-10-11 DIAGNOSIS — E875 Hyperkalemia: Secondary | ICD-10-CM | POA: Diagnosis not present

## 2021-10-11 DIAGNOSIS — Z79899 Other long term (current) drug therapy: Secondary | ICD-10-CM | POA: Diagnosis not present

## 2021-10-11 DIAGNOSIS — N186 End stage renal disease: Secondary | ICD-10-CM | POA: Diagnosis not present

## 2021-10-11 DIAGNOSIS — D509 Iron deficiency anemia, unspecified: Secondary | ICD-10-CM | POA: Diagnosis not present

## 2021-10-11 LAB — CBC WITH DIFFERENTIAL/PLATELET
Abs Immature Granulocytes: 0.05 10*3/uL (ref 0.00–0.07)
Basophils Absolute: 0.1 10*3/uL (ref 0.0–0.1)
Basophils Relative: 1 %
Eosinophils Absolute: 0.3 10*3/uL (ref 0.0–0.5)
Eosinophils Relative: 4 %
HCT: 26 % — ABNORMAL LOW (ref 36.0–46.0)
Hemoglobin: 9.2 g/dL — ABNORMAL LOW (ref 12.0–15.0)
Immature Granulocytes: 1 %
Lymphocytes Relative: 27 %
Lymphs Abs: 2 10*3/uL (ref 0.7–4.0)
MCH: 31.8 pg (ref 26.0–34.0)
MCHC: 35.4 g/dL (ref 30.0–36.0)
MCV: 90 fL (ref 80.0–100.0)
Monocytes Absolute: 0.5 10*3/uL (ref 0.1–1.0)
Monocytes Relative: 7 %
Neutro Abs: 4.6 10*3/uL (ref 1.7–7.7)
Neutrophils Relative %: 60 %
Platelets: 208 10*3/uL (ref 150–400)
RBC: 2.89 MIL/uL — ABNORMAL LOW (ref 3.87–5.11)
RDW: 17.1 % — ABNORMAL HIGH (ref 11.5–15.5)
WBC: 7.5 10*3/uL (ref 4.0–10.5)
nRBC: 0 % (ref 0.0–0.2)

## 2021-10-11 LAB — COMPREHENSIVE METABOLIC PANEL
ALT: 23 U/L (ref 0–44)
AST: 19 U/L (ref 15–41)
Albumin: 2.6 g/dL — ABNORMAL LOW (ref 3.5–5.0)
Alkaline Phosphatase: 45 U/L (ref 38–126)
Anion gap: 13 (ref 5–15)
BUN: 82 mg/dL — ABNORMAL HIGH (ref 8–23)
CO2: 23 mmol/L (ref 22–32)
Calcium: 7.7 mg/dL — ABNORMAL LOW (ref 8.9–10.3)
Chloride: 98 mmol/L (ref 98–111)
Creatinine, Ser: 8 mg/dL — ABNORMAL HIGH (ref 0.44–1.00)
GFR, Estimated: 5 mL/min — ABNORMAL LOW (ref >60)
Glucose, Bld: 198 mg/dL — ABNORMAL HIGH (ref 70–99)
Potassium: 4.6 mmol/L (ref 3.5–5.1)
Sodium: 134 mmol/L — ABNORMAL LOW (ref 135–145)
Total Bilirubin: 0.5 mg/dL (ref 0.3–1.2)
Total Protein: 5 g/dL — ABNORMAL LOW (ref 6.5–8.1)

## 2021-10-11 LAB — GLUCOSE, CAPILLARY
Glucose-Capillary: 133 mg/dL — ABNORMAL HIGH (ref 70–99)
Glucose-Capillary: 200 mg/dL — ABNORMAL HIGH (ref 70–99)
Glucose-Capillary: 131 mg/dL — ABNORMAL HIGH (ref 70–99)
Glucose-Capillary: 164 mg/dL — ABNORMAL HIGH (ref 70–99)

## 2021-10-11 MED ORDER — GENTAMICIN SULFATE 0.1 % EX CREA: 1.0000 "application " | TOPICAL_CREAM | Freq: Every day | CUTANEOUS | Status: DC

## 2021-10-11 MED ORDER — HEPARIN 1000 UNIT/ML FOR PERITONEAL DIALYSIS
INTRAPERITONEAL | Status: DC | PRN
  Filled 2021-10-11: qty 5000

## 2021-10-11 MED ORDER — HEPARIN 1000 UNIT/ML FOR PERITONEAL DIALYSIS: 500.0000 [IU] | INTRAMUSCULAR | Status: DC | PRN

## 2021-10-11 MED ORDER — DELFLEX-LC/1.5% DEXTROSE 344 MOSM/L IP SOLN: INTRAPERITONEAL | Status: DC

## 2021-10-11 MED ORDER — GLIPIZIDE 5 MG PO TABS: 5.0000 mg | ORAL_TABLET | Freq: Two times a day (BID) | ORAL | 1 refills | Status: DC

## 2021-10-11 MED ORDER — DELFLEX-LC/1.5% DEXTROSE 344 MOSM/L IP SOLN
INTRAPERITONEAL | Status: DC
  Administered 2021-10-11: 8000 mL via INTRAPERITONEAL

## 2021-10-11 NOTE — Evaluation (Signed)
Physical Therapy Evaluation Patient Details Name: Felicia Thompson MRN: [id number] DOB: 03-Jul-1946 Today's Date: 10/11/2021  History of Present Illness  Ryn Peine is a 75 y.o. female with PMH of DM, ESRD on peritoneal dialysis, HTN and anxiety/depression, peripheral neuropathy who is admitted to epilepsy monitoring unit for characterization of spells.  Clinical Impression  Patient presents with mobility close to functional baseline.  Unable to test on stairs due to EEG monitoring.  She ambulated in room back and forth to the door twice about 120' each time, bent to pick up items from floor, brushed her teeth, looked in cabinet and completed vestibular screen without provoking symptoms of her "spells".  Did educate that with fluids she may have been simply orthostatic and possibly adding compression stockings may help.  Also confirmed she has a seat for the shower and can use the rollator when on the main floor of the home where she reports she has had these spells.  Do not feel follow up or further acute PT needed at this time.  Will sign off.        Recommendations for follow up therapy are one component of a multi-disciplinary discharge planning process, led by the attending physician.  Recommendations may be updated based on patient status, additional functional criteria and insurance authorization.  Follow Up Recommendations No PT follow up    Assistance Recommended at Discharge Intermittent Supervision/Assistance  Functional Status Assessment Patient has not had a recent decline in their functional status  Equipment Recommendations  None recommended by PT    Recommendations for Other Services       Precautions / Restrictions Precautions Precautions: Fall      Mobility  Bed Mobility Overal bed mobility: Modified Independent                  Transfers Overall transfer level: Modified independent                       Ambulation/Gait Ambulation/Gait assistance: Independent Gait Distance (Feet): 120 Feet (x 2) Assistive device: None Gait Pattern/deviations: Step-through pattern;Decreased stride length       General Gait Details: back and forth in the room length of her cable for EEG monitoring some minor LOB with quick turns trying to elicit symptoms, but recovers on her own  Stairs            Wheelchair Mobility    Modified Rankin (Stroke Patients Only)       Balance Overall balance assessment: Modified Independent;Needs assistance   Sitting balance-Leahy Scale: Good       Standing balance-Leahy Scale: Good Standing balance comment: picks up item from floor, looks into cabinet up and down, gait with head turns in the room, tandem walking, marching in place, SLS                             Pertinent Vitals/Pain Pain Assessment: No/denies pain    Home Living Family/patient expects to be discharged to:: Private residence Living Arrangements: Children;Other relatives Available Help at Discharge: Family;Available PRN/intermittently Type of Home: House Home Access: Stairs to enter Entrance Stairs-Rails: Can reach both Entrance Stairs-Number of Steps: 5 Alternate Level Stairs-Number of Steps: 15 Home Layout: Two level;Bed/bath upstairs Home Equipment: Rollator (4 wheels);Tub bench      Prior Function Prior Level of Function : Independent/Modified Independent             Mobility Comments: spells  where she falls or passes out when feeling dizzy describes as spinning, but sometimes wakes up; usually from standing       Hand Dominance        Extremity/Trunk Assessment   Upper Extremity Assessment Upper Extremity Assessment: Overall WFL for tasks assessed    Lower Extremity Assessment Lower Extremity Assessment: Overall WFL for tasks assessed       Communication   Communication: No difficulties  Cognition Arousal/Alertness: Awake/alert Behavior  During Therapy: WFL for tasks assessed/performed Overall Cognitive Status: Within Functional Limits for tasks assessed                                          General Comments General comments (skin integrity, edema, etc.): Checked for vertigo wtih vestibular screen, noted some initial R gaze holding nystagmus, but could not replicate, intact saccades, smooth pursuits, VOR horizontal and vertical and VOR cancellation, horizontal and vertical head shake test without symptoms.  Sidelying modified hallpike negative L and R and supine head roll test for horizontal canal negative for positional vertigo.    Exercises     Assessment/Plan    PT Assessment Patient does not need any further PT services  PT Problem List         PT Treatment Interventions      PT Goals (Current goals can be found in the Care Plan section)  Acute Rehab PT Goals PT Goal Formulation: All assessment and education complete, DC therapy    Frequency     Barriers to discharge        Co-evaluation               AM-PAC PT "6 Clicks" Mobility  Outcome Measure Help needed turning from your back to your side while in a flat bed without using bedrails?: None Help needed moving from lying on your back to sitting on the side of a flat bed without using bedrails?: None Help needed moving to and from a bed to a chair (including a wheelchair)?: None Help needed standing up from a chair using your arms (e.g., wheelchair or bedside chair)?: None Help needed to walk in hospital room?: None Help needed climbing 3-5 steps with a railing? : Total 6 Click Score: 21    End of Session   Activity Tolerance: Patient tolerated treatment well Patient left: in bed;with call bell/phone within reach   PT Visit Diagnosis: Other symptoms and signs involving the nervous system (R29.898)    Time: 6213-0865 PT Time Calculation (min) (ACUTE ONLY): 42 min   Charges:   PT Evaluation $PT Eval Moderate  Complexity: 1 Mod PT Treatments $Therapeutic Activity: 8-22 mins $Self Care/Home Management: 8-22        Magda Kiel, PT Acute Rehabilitation Services Pager:[phone] [phone] 10/11/2021   Reginia Naas 10/11/2021, 4:48 PM

## 2021-10-11 NOTE — Progress Notes (Addendum)
[facility] PROGRESS NOTE   Felicia Thompson GGY:[id number] DOB: 1946-09-04 DOA: 10/08/2021  PCP: Isaac Bliss, Rayford Halsted, MD  Brief History/Interval Summary: 75 y.o. female with medical history significant of HTN, HLD, ESRD on PD, DM type II with neuropathy, who was admitted to hospital under neurology service with complaints of "dizzy" spells.  She was hospitalized for further evaluation for possible seizures.  Hospitalist was consulted due to episodes of hypoglycemia.      Subjective/Interval History: Patient denies any complaints this morning.  No issues since yesterday morning.  Slept well.  Understands that she needs to get up slowly from a sitting or lying position to avoid sudden drop in blood pressure.  No further episodes of low glucose levels     Assessment/Plan:  Hypoglycemia in the setting of diabetes mellitus type 2 on insulin HbA1c 9.0. About 2 months ago she was referred to endocrinology and was seen by Dr. Loanne Drilling.  At that time she was started on 70/30 insulin in addition to the glipizide 10mg  which she was already taking 3 times a day. Patient's glipizide was discontinued after she had episodes of hypoglycemia.  No further episodes noted.  Discussed her diabetes regimen in detail with patient.   Plan will be to decrease the dose of her glipizide to 5 mg to be taken twice a day.  She will monitor her glucose levels at home as she has been doing previously.  She will follow-up with her endocrinologist.  She has an appointment with Dr. Loanne Drilling next week.  Transient altered awareness Neurology is managing.  Currently on continuous EEG monitoring.  No seizure activity has been noted.  Orthostatic hypotension Thought to be responsible for at least some of her symptoms.  She was told to get up slowly from a sitting and lying position.  She may need compression stockings if symptoms persist.  End-stage renal disease on peritoneal dialysis Nephrology is  following.  Anemia of chronic kidney disease Stable. She was transfused 1 unit of PRBC during this hospital stay.  Thyroid Nodule This is being pursued by her PCP based on clinic notes.  Discussed with Dr. Hortense Ramal yesterday.  Will decrease the dose of glipizide to 5 mg twice a day.  Instructions provided to patient.  Will send new prescription to her pharmacy.  Have addressed all of this in medication reconciliation.  We are available to answer any questions.    Medications: Scheduled:  aspirin  81 mg Oral Daily   atorvastatin  80 mg Oral Daily   carvedilol  25 mg Oral BID WC   Chlorhexidine Gluconate Cloth  6 each Topical q morning   enoxaparin (LOVENOX) injection  30 mg Subcutaneous Q24H   gentamicin cream  1 application Topical Daily   insulin aspart  0-5 Units Subcutaneous QHS   insulin aspart  0-6 Units Subcutaneous TID WC   insulin aspart protamine- aspart  10 Units Subcutaneous Q breakfast   losartan  50 mg Oral Daily   sevelamer carbonate  1,600 mg Oral TID WC   venlafaxine XR  150 mg Oral Daily   Continuous:  dialysis solution 1.5% low-MG/low-CA     dialysis solution 2.5% low-MG/low-CA     [id number] **OR** acetaminophen, dextrose, labetalol, LORazepam  Antibiotics: Anti-infectives (From admission, onward)    None       Objective:  Vital Signs  Vitals:   10/11/21 0500 10/11/21 0721 10/11/21 0730 10/11/21 0916  BP:  (!) 146/57  (!) 156/58  Pulse:  75  76 82  Resp:  16 12   Temp:  98.1 F (36.7 C) 98.1 F (36.7 C)   TempSrc:  Oral Oral   SpO2:  99% 99%   Weight: 86.8 kg  85.5 kg   Height:        Intake/Output Summary (Last 24 hours) at 10/11/2021 1037 Last data filed at 10/11/2021 0813 Gross per 24 hour  Intake 600 ml  Output 1000 ml  Net -400 ml    Filed Weights   10/10/21 1047 10/11/21 0500 10/11/21 0730  Weight: 83.7 kg 86.8 kg 85.5 kg   Awake alert.  In no distress Lungs are clear to auscultation bilaterally.  Normal effort. S1-S2  is normal regular.  No S3-S4. Abdomen is soft.  Nontender nondistended.  Bowel sounds present normal.   Lab Results:  Data Reviewed: I have personally reviewed following labs and imaging studies  CBC: Recent Labs  Lab 10/08/21 0851 10/10/21 0852  WBC 11.4* 6.9  NEUTROABS 8.2*  --   HGB 7.7* 8.8*  HCT 22.9* 25.2*  MCV 93.5 89.0  PLT 246 204     Basic Metabolic Panel: Recent Labs  Lab 10/08/21 0851 10/09/21 0835 10/10/21 0852  NA 136 133* 134*  K 3.9 4.7 4.6  CL 97* 95* 96*  CO2 25 25 25   GLUCOSE 216* 172* 197*  BUN 69* 87* 84*  CREATININE 8.89* 8.91* 7.97*  CALCIUM 8.2* 7.6* 7.8*  MG 2.2  --   --   PHOS 6.2* 7.6*  --      GFR: Estimated Creatinine Clearance: 7.1 mL/min (A) (by C-G formula based on SCr of 7.97 mg/dL (H)).  Liver Function Tests: Recent Labs  Lab 10/08/21 0851 10/09/21 0835  AST 15  --   ALT 31  --   ALKPHOS 49  --   BILITOT 0.8  --   PROT 5.6*  --   ALBUMIN 2.9* 2.5*      Coagulation Profile: Recent Labs  Lab 10/08/21 0851  INR 1.1     CBG: Recent Labs  Lab 10/10/21 0622 10/10/21 1150 10/10/21 1700 10/10/21 2121 10/11/21 0724  GLUCAP 229* 122* 126* 207* 133*      Radiology Studies: No results found.     LOS: 3 days   Felicia Thompson [employer] on [email or web address]  10/11/2021, 10:37 AM

## 2021-10-11 NOTE — Procedures (Signed)
Patient Name: Matilda Fleig  MRN: [id number]  Epilepsy Attending: Lora Havens  Referring Physician/Provider: Dr. Zeb Comfort Duration: 10/09/2021 0846 to 10/10/2021 0846   Patient history: 75yo F with episodes of dizziness with alteration of awareness and fall.  EEG to evaluate for seizures.   Level of alertness: Awake, asleep   AEDs during EEG study: None   Technical aspects: This EEG study was done with scalp electrodes positioned according to the 10-20 International system of electrode placement. Electrical activity was acquired at a sampling rate of 500Hz  and reviewed with a high frequency filter of 70Hz  and a low frequency filter of 1Hz . EEG data were recorded continuously and digitally stored.    Description: The posterior dominant rhythm consists of 9 Hz activity of moderate voltage (25-35 uV) seen predominantly in posterior head regions, symmetric and reactive to eye opening and eye closing. Sleep was characterized by vertex waves, sleep spindles (12 to 14 Hz), maximal frontocentral region.    IMPRESSION: This study is within normal limits. No seizures or epileptiform discharges were seen throughout the recording.   Oakley Orban [name]

## 2021-10-11 NOTE — Progress Notes (Addendum)
[facility] Progress Note  Name: Felicia Thompson MRN: [id number] DOB: May 10, 1946  Chief Complaint:  ESRD on PD  Subjective:  Felicia Thompson feels well this morning. She had PD last night without complications- believes she put off about 1L of fluid. She has had no further episodes of dizziness since she has been hospitalized. She also urinated last night. She has been eating well. Has moved her bowels normally.  Review of systems:  No nausea, vomiting, chest pain, shortness of breath, abdominal pain, edema, dysgeusia.    Intake/Output Summary (Last 24 hours) at 10/11/2021 0821 Last data filed at 10/11/2021 0813 Gross per 24 hour  Intake 840 ml  Output 1000 ml  Net -160 ml    Vitals:  Vitals:   10/11/21 0440 10/11/21 0500 10/11/21 0721 10/11/21 0730  BP: 114/69  (!) 146/57   Pulse: 72  75 76  Resp: 14  16 12   Temp: 97.8 F (36.6 C)  98.1 F (36.7 C) 98.1 F (36.7 C)  TempSrc: Oral  Oral Oral  SpO2:   99% 99%  Weight:  86.8 kg  85.5 kg  Height:         Physical Exam:  General: Awake, alert, oriented, in no acute distress, pleasant and cooperative with examination.  HEENT: Normocephalic, atraumatic, nares patent, EEG leads on head Cardio: RRR without murmur, 2+ radial pulse b/l, no edema Respiratory: CTAB without wheezing/rhonchi/rales Abdomen: Soft, non-tender to palpation of all quadrants, non-distended, no rebound/guarding MSK: Able to move all extremities spontaneously, no abnormalities Neuro: Speech is clear and intact, no focal deficits, no facial asymmetry, follows commands  Psych: Normal mood and affect  Medications reviewed   Labs:  BMP Latest Ref Rng & Units 10/10/2021 10/09/2021 10/08/2021  Glucose 70 - 99 mg/dL 197(H) 172(H) 216(H)  BUN 8 - 23 mg/dL 84(H) 87(H) 69(H)  Creatinine 0.44 - 1.00 mg/dL 7.97(H) 8.91(H) 8.89(H)  Sodium 135 - 145 mmol/L 134(L) 133(L) 136  Potassium 3.5 - 5.1 mmol/L 4.6 4.7 3.9  Chloride 98 - 111 mmol/L 96(L)  95(L) 97(L)  CO2 22 - 32 mmol/L 25 25 25   Calcium 8.9 - 10.3 mg/dL 7.8(L) 7.6(L) 8.2(L)   Assessment/Plan:  Felicia Thompson is a 74 y.o. female who was admitted to the neurology service for work-up of her ongoing dizziness and episodes of LOC- now thought to be related to anemia and orthostatic hypotension.  Nephrology was consulted for management of PD.    ESRD on PD: Will continue PD if patient remains inpatient today. Appears that there may be plans of a possible d/c today.   Anemia of Chronic Disease: Hgb 7.7>1U PRBC transfusion>8.8. Stable.    Secondary hyperparathyroidism/Metabolic Bone disease: Continue home renvela.    Orthostatic Hypotension  HTN: Continue Losartan 50 mg, carvedilol 25 mg BID. BP seems improved.    T2DM: Management per consulting medicine team.   Depression: home venlafaxine per primary team      Sharion Settler, DO 10/11/2021 8:21 AM   Seen and examined independently.  Agree with note and exam as documented above by Dr. Nita Sells and as noted here.  General adult female in bed in no acute distress HEENT normocephalic atraumatic extraocular movements intact sclera anicteric Neck supple trachea midline Lungs normal work of breathing at rest  Abdomen soft nontender nondistended Extremities no edema  Psych normal mood and affect Neuro alert and oriented x 3 provides hx and follows commands Access Tenckhoff catheter  ESRD on PD - if here tonight 1.5% dextrose -  will need to raise EDW from 81 to closer to 85 kg  Anemia CKD  - s/p PRBC and outpatient ESA  Orthostatic hypotension - she will ambulate in her room more today and see how she feels. Was still orthostatic per vitals but pressures improved  HTN  - continue current regimen; decrease UF with PD - we have raised her dry weight    Claudia Desanctis, MD 10/11/2021 11:01 AM

## 2021-10-11 NOTE — Care Management Important Message (Signed)
Important Message  Patient Details  Name: Felicia Thompson MRN: [id number] Date of Birth: 1946-12-02   Medicare Important Message Given:  Yes     Orbie Pyo 10/11/2021, 2:35 PM

## 2021-10-11 NOTE — Progress Notes (Addendum)
Subjective: No episodes overnight. Orthostatic signs positive again  ROS: negative except above  Examination  Vital signs in last 24 hours: Temp:  [97.8 F (36.6 C)-98.7 F (37.1 C)] 98.7 F (37.1 C) (11/10 1123) Pulse Rate:  [72-89] 76 (11/10 1123) Resp:  [12-18] 15 (11/10 1123) BP: (114-156)/(57-71) 153/59 (11/10 1123) SpO2:  [97 %-100 %] 100 % (11/10 1123) Weight:  [85.5 kg-86.8 kg] 85.5 kg (11/10 0730)  General: lying in bed, NAD CVS: pulse-normal rate and rhythm RS: breathing comfortably Extremities: normal    Neuro: MS: Alert, oriented, follows commands CN: pupils equal and reactive,  EOMI, face symmetric, tongue midline, normal sensation over face Motor: 5/5 strength in all 4 extremities Reflexes: 2+ bilaterally over patella, biceps, plantars: flexor Coordination: normal Gait: not tested  Basic Metabolic Panel: Recent Labs  Lab 10/08/21 0851 10/09/21 0835 10/10/21 0852 10/11/21 1016  NA 136 133* 134* 134*  K 3.9 4.7 4.6 4.6  CL 97* 95* 96* 98  CO2 25 25 25 23   GLUCOSE 216* 172* 197* 198*  BUN 69* 87* 84* 82*  CREATININE 8.89* 8.91* 7.97* 8.00*  CALCIUM 8.2* 7.6* 7.8* 7.7*  MG 2.2  --   --   --   PHOS 6.2* 7.6*  --   --     CBC: Recent Labs  Lab 10/08/21 0851 10/10/21 0852 10/11/21 1016  WBC 11.4* 6.9 7.5  NEUTROABS 8.2*  --  4.6  HGB 7.7* 8.8* 9.2*  HCT 22.9* 25.2* 26.0*  MCV 93.5 89.0 90.0  PLT 246 204 208     Coagulation Studies: No results for input(s): LABPROT, INR in the last 72 hours.  Imaging No new brain imaging overnight     ASSESSMENT AND PLAN 75yo F with episodes of dizziness with alteration of awareness and fall.    Transient alteration of awareness Dizziness Syncope -These episodes are most likely due to orthostasis and anemia -Continue vEEG for characterization of spells -Continue seizure precautions - PT eval to see if we can trigger episodes   Orthostatic hypotension -Continues to be orthostatic, plan for  compression stockings at time of discharge -Consulted nephrology, appreciate their input and recommendations   End-stage renal disease on peritoneal dialysis Hyponatremia Hypoproteinemia with hypoalbuminemia -On peritoneal dialysis.  Appreciate nephrology assistance   Hypertension - Reduced losartan to 25 mg daily per nephrology.   Anemia - Nephrology recommended transfusing PRBCs 1 unit -  Hemoglobin improved   Diabetes -No further episodes of hypoglycemia.  Appreciate medicine assistance   I have spent a total of 26 minutes with the patient reviewing hospital notes,  test results, labs and examining the patient as well as establishing an assessment and plan that was discussed personally with the patient. 50% of time was spent in direct patient care.   Zeb Comfort Epilepsy [facility] For questions after 5pm please refer to AMION to reach the Neurologist on call

## 2021-10-12 DIAGNOSIS — R82998 Other abnormal findings in urine: Secondary | ICD-10-CM | POA: Diagnosis not present

## 2021-10-12 DIAGNOSIS — Z23 Encounter for immunization: Secondary | ICD-10-CM | POA: Diagnosis not present

## 2021-10-12 DIAGNOSIS — D631 Anemia in chronic kidney disease: Secondary | ICD-10-CM | POA: Diagnosis not present

## 2021-10-12 DIAGNOSIS — D509 Iron deficiency anemia, unspecified: Secondary | ICD-10-CM | POA: Diagnosis not present

## 2021-10-12 DIAGNOSIS — Z79899 Other long term (current) drug therapy: Secondary | ICD-10-CM | POA: Diagnosis not present

## 2021-10-12 DIAGNOSIS — E875 Hyperkalemia: Secondary | ICD-10-CM | POA: Diagnosis not present

## 2021-10-12 DIAGNOSIS — N2581 Secondary hyperparathyroidism of renal origin: Secondary | ICD-10-CM | POA: Diagnosis not present

## 2021-10-12 DIAGNOSIS — Z992 Dependence on renal dialysis: Secondary | ICD-10-CM | POA: Diagnosis not present

## 2021-10-12 DIAGNOSIS — N186 End stage renal disease: Secondary | ICD-10-CM | POA: Diagnosis not present

## 2021-10-12 LAB — GLUCOSE, CAPILLARY
Glucose-Capillary: 136 mg/dL — ABNORMAL HIGH (ref 70–99)
Glucose-Capillary: 137 mg/dL — ABNORMAL HIGH (ref 70–99)

## 2021-10-12 MED ORDER — LOSARTAN POTASSIUM 50 MG PO TABS
50.0000 mg | ORAL_TABLET | Freq: Every day | ORAL | 3 refills | Status: AC
Start: 2021-10-12 — End: ?

## 2021-10-12 MED ORDER — LOSARTAN POTASSIUM 50 MG PO TABS: 25.0000 mg | ORAL_TABLET | Freq: Every day | ORAL | 3 refills | Status: DC

## 2021-10-12 NOTE — Discharge Instructions (Addendum)
You were admitted to epilepsy monitoring unit from 11 06/2019 due to 11 10/2021 for characterization of transient alteration of awareness.  During this period you underwent continuous video EEG monitoring.  You also had hyperventilation, photic stimulation as well as sleep deprivation to provoke spells. EEG was within normal limits, no ictal-interictal activity was noted. No events were recorded.    You were noted to have orthostatic hypotension. Nephrology was consulted and recommended adjusting your peritoneal dialysis settings, reduce losartan to 50 mg daily.  You were also noted to have episodes of hypoglycemia.  Medicine was consulted and recommended reducing your glipizide to 5 mg twice daily.  These episodes are most likely related to orthostatic hypotension.  Recommend increasing fluid intake, compression stockings.PT was also consulted and recommending having a seat in the shower as well as Rollator walker if she is feeling dizzy.  If episodes persist after managing her orthostatic hypotension, we can consider further work-up for dysautonomia and repeat EMU admission.  This was discussed with patient's daughter Sonia Baller who is also an occupational therapist who understands and agrees with the plan.

## 2021-10-12 NOTE — Discharge Summary (Addendum)
Physician Discharge Summary  Patient ID: Felicia Thompson MRN: [id number] DOB/AGE: 1946-06-22 75 y.o.  Admit date: 10/08/2021 Discharge date: 10/12/2021  Admission Diagnoses: Transient alteration of awareness  Discharge Diagnoses: Syncope due to orthostatic hypotension  Discharged Condition: stable  Hospital Course: Ms. Royetta Asal was admitted to epilepsy monitoring unit from 11 06/2019 due to 11 10/2021 for characterization of transient alteration of awareness.  During this period she underwent continuous video EEG monitoring, had hyperventilation, photic stimulation as well as sleep deprivation to provoke spells. No events were recorded. EEG was within normal limits, no ictal-interictal activity was noted.    During the admission she was noted to have orthostatic hypotension. Nephrology was consulted and recommended adjusting your peritoneal dialysis settings, reduce losartan to 50 mg daily.  She was also noted to have episodes of hypoglycemia.  Medicine was consulted and recommended reducing glipizide to 5 mg twice daily.  I spoke with patient and her daughter Sonia Baller who is also an outpatient therapist.  All these episodes have always happen when patient is mostly standing up or sometimes sitting down but never lying down.  She does stiffen up during the episodes which raised concern for epilepsy.  However, her EEG has been within normal limits.  Given significant orthostasis, all episodes happening while standing up, normal EEG and patient not having any other risk factors for epilepsy , I suspect these episodes are most likely related to orthostatic hypotension. Recommend increasing fluid intake, compression stockings.PT was also consulted and recommending having a seat in the shower as well as Rollator walker if she is feeling dizzy.  If episodes persist after managing her orthostatic hypotension, we can consider further work-up for dysautonomia and repeat EMU admission.  This was discussed  with patient's daughter Sonia Baller who is also an occupational therapist who understands and agrees with the plan.  Consults: Nephrology, internal medicine, physical therapy  Significant Diagnostic Studies:   Patient Name: Felicia Thompson  MRN: [id number]  Epilepsy Attending: Lora Havens  Referring Physician/Provider: Dr. Zeb Comfort Duration: 10/10/2021 0846 to 10/11/2021 0830   Patient history: 75yo F with episodes of dizziness with alteration of awareness and fall.  EEG to evaluate for seizures.   Level of alertness: Awake, asleep   AEDs during EEG study: None   Technical aspects: This EEG study was done with scalp electrodes positioned according to the 10-20 International system of electrode placement. Electrical activity was acquired at a sampling rate of 500Hz  and reviewed with a high frequency filter of 70Hz  and a low frequency filter of 1Hz . EEG data were recorded continuously and digitally stored.    Description: The posterior dominant rhythm consists of 9 Hz activity of moderate voltage (25-35 uV) seen predominantly in posterior head regions, symmetric and reactive to eye opening and eye closing. Sleep was characterized by vertex waves, sleep spindles (12 to 14 Hz), maximal frontocentral region.    IMPRESSION: This study is within normal limits. No seizures or epileptiform discharges were seen throughout the recording.    Treatments: Reduce losartan to 50mg  daily, reduce glipizide to 5 mg twice daily, compression stockings  Discharge Exam: Blood pressure 129/74, pulse (!) 101, temperature 97.7 F (36.5 C), temperature source Oral, resp. rate 16, height 5\' 7"  (1.702 m), weight 90.2 kg, SpO2 100 %.  General: lying in bed, NAD CVS: pulse-normal rate and rhythm RS: breathing comfortably Extremities: normal    Neuro: MS: Alert, oriented, follows commands CN: pupils equal and reactive,  EOMI, face symmetric, tongue midline, normal  sensation over face Motor: 5/5  strength in all 4 extremities Reflexes: 2+ bilaterally over patella, biceps, plantars: flexor Coordination: normal Gait: not tested  Disposition: Discharge disposition: 01-Home or Self Care    Discharge Instructions     Compression stockings   Complete by: As directed    Diet - low sodium heart healthy   Complete by: As directed    Increase activity slowly   Complete by: As directed    No wound care   Complete by: As directed       Allergies as of 10/12/2021       Reactions   Tape Other (See Comments)   Blisters   Tetracyclines & Related Rash        Medication List     TAKE these medications    aspirin 81 MG chewable tablet Chew 81 mg by mouth daily.   atorvastatin 80 MG tablet Commonly known as: LIPITOR Take 1 tablet (80 mg total) by mouth daily.   carvedilol 25 MG tablet Commonly known as: COREG Take 25 mg by mouth 2 (two) times daily with a meal.   [employer] ADV PROBIOTIC GUMMIES PO Take 1 tablet by mouth at bedtime.   FreeStyle Libre 2 Sensor Misc 1 Device by Does not apply route every 14 (fourteen) days.   glipiZIDE 5 MG tablet Commonly known as: GLUCOTROL Take 1 tablet (5 mg total) by mouth 2 (two) times daily before a meal. What changed: when to take this   HumuLIN 70/30 (70-30) 100 UNIT/ML injection Generic drug: insulin NPH-regular Human Inject 10 Units into the skin daily with breakfast.   losartan 50 MG tablet Commonly known as: COZAAR Take 1 tablet (50 mg total) by mouth daily. What changed:  how much to take when to take this   RenaPlex Tabs Take 1 tablet by mouth daily.   Renvela 800 MG tablet Generic drug: sevelamer carbonate Take 1,600 mg by mouth 3 (three) times daily with meals.   venlafaxine XR 150 MG 24 hr capsule Commonly known as: EFFEXOR-XR Take 150 mg by mouth daily with breakfast.   vitamin B-12 1000 MCG tablet Commonly known as: CYANOCOBALAMIN Take 1,000 mcg by mouth every other day.       I have spent a total  of  37  minutes with the patient reviewing hospital notes,  test results, labs and examining the patient as well as establishing an assessment and plan that was discussed personally with the patient.  > 50% of time was spent in direct patient care.   Signed: Lora Havens 10/12/2021, 10:13 AM

## 2021-10-12 NOTE — Progress Notes (Signed)
[facility] Progress Note  Name: Felicia Thompson MRN: [id number] DOB: Dec 14, 1945  Chief Complaint:  ESRD on PD  Subjective:  She feels well today - she is excited to be going home.  Likely was weighed with fluid in.  Thinks she somehow got 1.5 and 2.5 mix last night and we discussed doing all 1.5% tonight then contacting her PD RN for instructions.   Review of systems:  No nausea, vomiting, chest pain, shortness of breath, abdominal pain, edema, dysgeusia.  No dizziness or presyncope/syncope   Intake/Output Summary (Last 24 hours) at 10/12/2021 0819 Last data filed at 10/12/2021 0500 Gross per 24 hour  Intake 8007 ml  Output 9964 ml  Net -1957 ml    Vitals:  Vitals:   10/11/21 2345 10/12/21 0026 10/12/21 0512 10/12/21 0626  BP: (!) 136/115 (!) 141/68    Pulse: 78 74    Resp: (!) 22 15    Temp: 98.6 F (37 C) 98.3 F (36.8 C) 97.7 F (36.5 C)   TempSrc: Oral Oral Oral   SpO2: 98% 98%    Weight:    90.2 kg  Height:         Physical Exam:  General: Awake, alert, oriented, in no acute distress, pleasant and cooperative with examination.  HEENT: Normocephalic, atraumatic, nares patent, EEG leads on head Cardio: RRR without murmur, 2+ radial pulse b/l, no edema Respiratory: CTAB without wheezing/rhonchi/rales Abdomen: Soft, non-tender to palpation of all quadrants, non-distended, no rebound/guarding MSK: Able to move all extremities spontaneously, no abnormalities Neuro: Speech is clear and intact, no focal deficits, no facial asymmetry, follows commands  Psych: Normal mood and affect  Medications reviewed   Labs:  BMP Latest Ref Rng & Units 10/11/2021 10/10/2021 10/09/2021  Glucose 70 - 99 mg/dL 198(H) 197(H) 172(H)  BUN 8 - 23 mg/dL 82(H) 84(H) 87(H)  Creatinine 0.44 - 1.00 mg/dL 8.00(H) 7.97(H) 8.91(H)  Sodium 135 - 145 mmol/L 134(L) 134(L) 133(L)  Potassium 3.5 - 5.1 mmol/L 4.6 4.6 4.7  Chloride 98 - 111 mmol/L 98 96(L) 95(L)  CO2 22 - 32  mmol/L 23 25 25   Calcium 8.9 - 10.3 mg/dL 7.7(L) 7.8(L) 7.6(L)     Assessment/Plan:  Felicia Thompson is a 75 y.o. female who was admitted to the neurology service for work-up of her ongoing dizziness and episodes of LOC- now thought to be related to anemia and orthostatic hypotension.  Nephrology was consulted for management of PD.    ESRD on PD: Will continue PD if patient remains inpatient tonight. About 1L off yesterday. EDW to be around 84 kg.    Anemia of Chronic Disease: Hgb 7.7>1U PRBC transfusion>8.8. Stable.    Secondary hyperparathyroidism/Metabolic Bone disease: Continue home renvela.    Orthostatic Hypotension  HTN: Continue Losartan 50 mg, carvedilol 25 mg BID. Counseled on getting up from seated position slowly. Can use compression stockings to help. Appears to have been possible fluid issue with patient taking too much off. Safe for d/c from Nephrology standpoint.   T2DM: Management per consulting medicine team.   Depression: home venlafaxine per primary team   Sharion Settler, DO 10/12/2021 8:19 AM  Seen and examined independently.  Agree with note and exam as documented above by Dr. Nita Sells extender and as noted here.  Walked around room yesterday and felt well.   General adult female in bed in no acute distress HEENT normocephalic atraumatic extraocular movements intact sclera anicteric Neck supple trachea midline Lungs clear to auscultation bilaterally normal work  of breathing at rest  Heart S1S2 no rub Abdomen soft nontender nondistended Extremities no edema  Psych normal mood and affect Neuro alert and oriented x 3 provides hx and follows commands Access PD catheter with dressing intact  ESRD on PD - use 1.5% dextrose tonight then asked patient to contact her PD RN for guidance - weights do not appear correct inpatient and appeared to be weighed with fluid in today - will need to raise EDW from 81 to closer to 85 kg   Anemia CKD  - s/p PRBC  and outpatient ESA   Orthostatic hypotension - no symptoms since admission   HTN  - continue current regimen; decrease UF with PD - we have raised her dry weight    She is to be discharged today she reports.  Stable from a renal standpoint  Claudia Desanctis, MD 10/12/2021  9:58 AM

## 2021-10-12 NOTE — Plan of Care (Signed)

## 2021-10-12 NOTE — Progress Notes (Signed)
LTM EEG discontinued - no skin breakdown at unhook.   

## 2021-10-12 NOTE — Progress Notes (Signed)
Advised of pt's d/c today by Dr Royce Macadamia. Contacted [place] PD unit and spoke to [place] regarding pt's d/c today. D/C summary to be faxed for continuation of care (fax# [phone]).   Melven Sartorius Renal Navigator [phone]

## 2021-10-12 NOTE — Progress Notes (Signed)
Patient ready for discharge to home; discharge instructions given and reviewed; Rx sent electronically. Patient self dressed and discharged out via wheelchair with the volunteer ;accompanied home by her daughter.

## 2021-10-12 NOTE — Procedures (Addendum)
Patient Name: Felicia Thompson  MRN: [id number]  Epilepsy Attending: Lora Havens  Referring Physician/Provider: Dr. Zeb Comfort Duration: 10/10/2021 0846 to 10/11/2021 1021   Patient history: 75yo F with episodes of dizziness with alteration of awareness and fall.  EEG to evaluate for seizures.   Level of alertness: Awake, asleep   AEDs during EEG study: None   Technical aspects: This EEG study was done with scalp electrodes positioned according to the 10-20 International system of electrode placement. Electrical activity was acquired at a sampling rate of 500Hz  and reviewed with a high frequency filter of 70Hz  and a low frequency filter of 1Hz . EEG data were recorded continuously and digitally stored.    Description: The posterior dominant rhythm consists of 9 Hz activity of moderate voltage (25-35 uV) seen predominantly in posterior head regions, symmetric and reactive to eye opening and eye closing. Sleep was characterized by vertex waves, sleep spindles (12 to 14 Hz), maximal frontocentral region.    IMPRESSION: This study is within normal limits. No seizures or epileptiform discharges were seen throughout the recording.   [name] [name]

## 2021-10-13 DIAGNOSIS — D509 Iron deficiency anemia, unspecified: Secondary | ICD-10-CM | POA: Diagnosis not present

## 2021-10-13 DIAGNOSIS — R82998 Other abnormal findings in urine: Secondary | ICD-10-CM | POA: Diagnosis not present

## 2021-10-13 DIAGNOSIS — Z23 Encounter for immunization: Secondary | ICD-10-CM | POA: Diagnosis not present

## 2021-10-13 DIAGNOSIS — Z992 Dependence on renal dialysis: Secondary | ICD-10-CM | POA: Diagnosis not present

## 2021-10-13 DIAGNOSIS — Z79899 Other long term (current) drug therapy: Secondary | ICD-10-CM | POA: Diagnosis not present

## 2021-10-13 DIAGNOSIS — D631 Anemia in chronic kidney disease: Secondary | ICD-10-CM | POA: Diagnosis not present

## 2021-10-13 DIAGNOSIS — N186 End stage renal disease: Secondary | ICD-10-CM | POA: Diagnosis not present

## 2021-10-13 DIAGNOSIS — N2581 Secondary hyperparathyroidism of renal origin: Secondary | ICD-10-CM | POA: Diagnosis not present

## 2021-10-13 DIAGNOSIS — E875 Hyperkalemia: Secondary | ICD-10-CM | POA: Diagnosis not present

## 2021-10-14 DIAGNOSIS — Z23 Encounter for immunization: Secondary | ICD-10-CM | POA: Diagnosis not present

## 2021-10-14 DIAGNOSIS — E875 Hyperkalemia: Secondary | ICD-10-CM | POA: Diagnosis not present

## 2021-10-14 DIAGNOSIS — D509 Iron deficiency anemia, unspecified: Secondary | ICD-10-CM | POA: Diagnosis not present

## 2021-10-14 DIAGNOSIS — R82998 Other abnormal findings in urine: Secondary | ICD-10-CM | POA: Diagnosis not present

## 2021-10-14 DIAGNOSIS — Z992 Dependence on renal dialysis: Secondary | ICD-10-CM | POA: Diagnosis not present

## 2021-10-14 DIAGNOSIS — Z79899 Other long term (current) drug therapy: Secondary | ICD-10-CM | POA: Diagnosis not present

## 2021-10-14 DIAGNOSIS — N2581 Secondary hyperparathyroidism of renal origin: Secondary | ICD-10-CM | POA: Diagnosis not present

## 2021-10-14 DIAGNOSIS — D631 Anemia in chronic kidney disease: Secondary | ICD-10-CM | POA: Diagnosis not present

## 2021-10-14 DIAGNOSIS — N186 End stage renal disease: Secondary | ICD-10-CM | POA: Diagnosis not present

## 2021-10-15 ENCOUNTER — Ambulatory Visit: Payer: Medicare Other | Admitting: Endocrinology

## 2021-10-15 ENCOUNTER — Other Ambulatory Visit: Payer: Self-pay

## 2021-10-15 VITALS — BP 160/70 | HR 88 | Ht 67.0 in | Wt 186.4 lb

## 2021-10-15 DIAGNOSIS — N186 End stage renal disease: Secondary | ICD-10-CM | POA: Diagnosis not present

## 2021-10-15 DIAGNOSIS — E118 Type 2 diabetes mellitus with unspecified complications: Secondary | ICD-10-CM

## 2021-10-15 DIAGNOSIS — D631 Anemia in chronic kidney disease: Secondary | ICD-10-CM | POA: Diagnosis not present

## 2021-10-15 DIAGNOSIS — E875 Hyperkalemia: Secondary | ICD-10-CM | POA: Diagnosis not present

## 2021-10-15 DIAGNOSIS — Z23 Encounter for immunization: Secondary | ICD-10-CM | POA: Diagnosis not present

## 2021-10-15 DIAGNOSIS — Z79899 Other long term (current) drug therapy: Secondary | ICD-10-CM | POA: Diagnosis not present

## 2021-10-15 DIAGNOSIS — D509 Iron deficiency anemia, unspecified: Secondary | ICD-10-CM | POA: Diagnosis not present

## 2021-10-15 DIAGNOSIS — R82998 Other abnormal findings in urine: Secondary | ICD-10-CM | POA: Diagnosis not present

## 2021-10-15 DIAGNOSIS — Z992 Dependence on renal dialysis: Secondary | ICD-10-CM | POA: Diagnosis not present

## 2021-10-15 DIAGNOSIS — N2581 Secondary hyperparathyroidism of renal origin: Secondary | ICD-10-CM | POA: Diagnosis not present

## 2021-10-15 LAB — POCT GLYCOSYLATED HEMOGLOBIN (HGB A1C): Hemoglobin A1C: 7.4 % — AB (ref 4.0–5.6)

## 2021-10-15 MED ORDER — FREESTYLE LIBRE 2 SENSOR MISC: 1.0000 | 3 refills | Status: DC

## 2021-10-15 MED ORDER — GLIPIZIDE 5 MG PO TABS
5.0000 mg | ORAL_TABLET | Freq: Every day | ORAL | 1 refills | Status: DC
Start: 2021-10-15 — End: 2022-02-05

## 2021-10-15 NOTE — Progress Notes (Signed)
Subjective:    Patient ID: Felicia Thompson, female    DOB: [birth year], 75 y.o.   MRN: [id number]  HPI Pt returns for f/u of diabetes mellitus: DM type: Insulin-requiring type 2 Dx'ed: 8527 Complications: ESRD (due to NSAID) Therapy: insulin since 2022 GDM: never DKA: never Severe hypoglycemia: never Pancreatitis: (GB) in approx 2000. Pancreatic imaging: none known SDOH: dtr cares for her, and provides some hx, due to pt's memory loss; She says meds are too expensive.  Other: she takes overnight PD Interval history: no cbg record, but since hosp d/c, cbg varies from 56-170.  She received blood transfusion in the hospital.  She stopped using continuous glucose monitor Past Medical History:  Diagnosis Date   Arthritis    Blood in stool    when appendix burst   Chronic kidney disease    Depression    DM (diabetes mellitus), type 2 with complications ([place])    ESRD on peritoneal dialysis ([place])    History of fainting spells of unknown cause    Hyperlipidemia    Hyperlipidemia associated with type 2 diabetes mellitus ([place])    Hypertension    OSA (obstructive sleep apnea)     Past Surgical History:  Procedure Laterality Date   ABDOMINAL HYSTERECTOMY     APPENDECTOMY     CHOLECYSTECTOMY     TONSILECTOMY, ADENOIDECTOMY, BILATERAL MYRINGOTOMY AND TUBES      Social History   Socioeconomic History   Marital status: Widowed    Spouse name: Not on file   Number of children: 4   Years of education: Not on file   Highest education level: Master's degree (e.g., MA, MS, MEng, MEd, MSW, MBA)  Occupational History   Not on file  Tobacco Use   Smoking status: Never   Smokeless tobacco: Never  Substance and Sexual Activity   Alcohol use: Never   Drug use: Never   Sexual activity: Not Currently  Other Topics Concern   Not on file  Social History Narrative   Lives with daughter and son in law and 2 children   Right handed    Caffeine: 3 cup a week   Social Determinants  of Health   Financial Resource Strain: Low Risk    Difficulty of Paying Living Expenses: Not hard at all  Food Insecurity: No Food Insecurity   Worried About Charity fundraiser in the Last Year: Never true   Sweetser in the Last Year: Never true  Transportation Needs: No Transportation Needs   Lack of Transportation (Medical): No   Lack of Transportation (Non-Medical): No  Physical Activity: Unknown   Days of Exercise per Week: 0 days   Minutes of Exercise per Session: Not on file  Stress: No Stress Concern Present   Feeling of Stress : Not at all  Social Connections: Moderately Isolated   Frequency of Communication with Friends and Family: More than three times a week   Frequency of Social Gatherings with Friends and Family: Never   Attends Religious Services: 1 to 4 times per year   Active Member of [employer] or Organizations: No   Attends Archivist Meetings: Not on file   Marital Status: Widowed  Intimate Partner Violence: Not on file    Current Outpatient Medications on File Prior to Visit  Medication Sig Dispense Refill   aspirin 81 MG chewable tablet Chew 81 mg by mouth daily.     atorvastatin (LIPITOR) 80 MG tablet Take 1 tablet (80 mg  total) by mouth daily. 90 tablet 1   carvedilol (COREG) 25 MG tablet Take 25 mg by mouth 2 (two) times daily with a meal.     insulin NPH-regular Human (HUMULIN 70/30) (70-30) 100 UNIT/ML injection Inject 10 Units into the skin daily with breakfast. 30 mL 11   losartan (COZAAR) 50 MG tablet Take 1 tablet (50 mg total) by mouth daily. 30 tablet 3   Multiple Vitamins-Minerals (RENAPLEX) TABS Take 1 tablet by mouth daily.     Probiotic Product ([employer] ADV PROBIOTIC GUMMIES PO) Take 1 tablet by mouth at bedtime.     RENVELA 800 MG tablet Take 1,600 mg by mouth 3 (three) times daily with meals.     venlafaxine XR (EFFEXOR-XR) 150 MG 24 hr capsule Take 150 mg by mouth daily with breakfast.     vitamin B-12 (CYANOCOBALAMIN) 1000 MCG  tablet Take 1,000 mcg by mouth every other day.     No current facility-administered medications on file prior to visit.    Allergies  Allergen Reactions   Tape Other (See Comments)    Blisters   Tetracyclines & Related Rash    Family History  Problem Relation Age of Onset   Hearing loss Father    Hyperlipidemia Father    Heart disease Father    Early death Father    Cancer Father    Early death Brother    Drug abuse Brother    Diabetes Brother    Depression Brother    Alcohol abuse Brother    Miscarriages / [place] Daughter    Arthritis Daughter    Depression Daughter    Early death Maternal Grandmother    Heart attack Maternal Grandmother    Heart disease Maternal Grandmother    Hearing loss Paternal Grandmother    Heart disease Paternal Grandmother    Heart disease Paternal Grandfather    Early death Paternal Grandfather    Heart attack Paternal Grandfather     BP (!) 160/70 (BP Location: Right Arm, Patient Position: Sitting, Cuff Size: Large)   Pulse 88   Ht 5\' 7"  (1.702 m)   Wt 186 lb 6.4 oz (84.6 kg)   SpO2 97%   BMI 29.19 kg/m    Review of Systems Denies LOC.      Objective:   Physical Exam    Lab Results  Component Value Date   HGBA1C 7.4 (A) 10/15/2021      Assessment & Plan:  Insulin-requiring type 2 DM.  Hypoglycemia, due to insulin: this limits aggressiveness of glycemic control.   Patient Instructions  check your blood sugar twice a day.  vary the time of day when you check, between before the 3 meals, and at bedtime.  also check if you have symptoms of your blood sugar being too high or too low.  please keep a record of the readings and bring it to your next appointment here (or you can bring the meter itself).  You can write it on any piece of paper.  please call us sooner if your blood sugar goes below 70, or if most of your readings are over 200.   We will need to take this complex situation in stages.   I have sent a prescription  to your pharmacy, to reduce the glipizide to breakfast only, and:  Please continue the same insulin.  I have also sent a prescription to your pharmacy, for the continuous glucose monitor.   On this type of insulin schedule, you should eat meals  on a regular schedule.  If a meal is missed or significantly delayed, your blood sugar could go low.    Please come back for a follow-up appointment in 2 months.

## 2021-10-15 NOTE — Patient Instructions (Addendum)
check your blood sugar twice a day.  vary the time of day when you check, between before the 3 meals, and at bedtime.  also check if you have symptoms of your blood sugar being too high or too low.  please keep a record of the readings and bring it to your next appointment here (or you can bring the meter itself).  You can write it on any piece of paper.  please call us sooner if your blood sugar goes below 70, or if most of your readings are over 200.   We will need to take this complex situation in stages.   I have sent a prescription to your pharmacy, to reduce the glipizide to breakfast only, and:  Please continue the same insulin.  I have also sent a prescription to your pharmacy, for the continuous glucose monitor.   On this type of insulin schedule, you should eat meals on a regular schedule.  If a meal is missed or significantly delayed, your blood sugar could go low.    Please come back for a follow-up appointment in 2 months.

## 2021-10-16 DIAGNOSIS — D509 Iron deficiency anemia, unspecified: Secondary | ICD-10-CM | POA: Diagnosis not present

## 2021-10-16 DIAGNOSIS — E875 Hyperkalemia: Secondary | ICD-10-CM | POA: Diagnosis not present

## 2021-10-16 DIAGNOSIS — Z79899 Other long term (current) drug therapy: Secondary | ICD-10-CM | POA: Diagnosis not present

## 2021-10-16 DIAGNOSIS — D631 Anemia in chronic kidney disease: Secondary | ICD-10-CM | POA: Diagnosis not present

## 2021-10-16 DIAGNOSIS — Z23 Encounter for immunization: Secondary | ICD-10-CM | POA: Diagnosis not present

## 2021-10-16 DIAGNOSIS — R82998 Other abnormal findings in urine: Secondary | ICD-10-CM | POA: Diagnosis not present

## 2021-10-16 DIAGNOSIS — N186 End stage renal disease: Secondary | ICD-10-CM | POA: Diagnosis not present

## 2021-10-16 DIAGNOSIS — Z992 Dependence on renal dialysis: Secondary | ICD-10-CM | POA: Diagnosis not present

## 2021-10-16 DIAGNOSIS — N2581 Secondary hyperparathyroidism of renal origin: Secondary | ICD-10-CM | POA: Diagnosis not present

## 2021-10-17 ENCOUNTER — Telehealth: Payer: Self-pay

## 2021-10-17 DIAGNOSIS — E875 Hyperkalemia: Secondary | ICD-10-CM | POA: Diagnosis not present

## 2021-10-17 DIAGNOSIS — Z992 Dependence on renal dialysis: Secondary | ICD-10-CM | POA: Diagnosis not present

## 2021-10-17 DIAGNOSIS — N186 End stage renal disease: Secondary | ICD-10-CM | POA: Diagnosis not present

## 2021-10-17 DIAGNOSIS — Z23 Encounter for immunization: Secondary | ICD-10-CM | POA: Diagnosis not present

## 2021-10-17 DIAGNOSIS — N2581 Secondary hyperparathyroidism of renal origin: Secondary | ICD-10-CM | POA: Diagnosis not present

## 2021-10-17 DIAGNOSIS — D631 Anemia in chronic kidney disease: Secondary | ICD-10-CM | POA: Diagnosis not present

## 2021-10-17 DIAGNOSIS — Z79899 Other long term (current) drug therapy: Secondary | ICD-10-CM | POA: Diagnosis not present

## 2021-10-17 DIAGNOSIS — R82998 Other abnormal findings in urine: Secondary | ICD-10-CM | POA: Diagnosis not present

## 2021-10-17 DIAGNOSIS — D509 Iron deficiency anemia, unspecified: Secondary | ICD-10-CM | POA: Diagnosis not present

## 2021-10-17 NOTE — Telephone Encounter (Signed)
Transition Care Management Follow-up Telephone Call Date of discharge and from where: 10/12/2021 / [facility]  How have you been since you were released from the hospital? " Doing good." Any questions or concerns? No  Items Reviewed: Did the pt receive and understand the discharge instructions provided? Yes  Medications obtained and verified? Yes  Other? No  Any new allergies since your discharge? No  Dietary orders reviewed? Yes ( patient states she is on a kidney diet. Has been given information regarding this diet.) Do you have support at home? Yes   Home Care and Equipment/Supplies: Were home health services ordered? no If so, what is the name of the agency? N/a  Has the agency set up a time to come to the patient's home? not applicable Were any new equipment or medical supplies ordered?  No What is the name of the medical supply agency? N/a Were you able to get the supplies/equipment? not applicable Do you have any questions related to the use of the equipment or supplies? No  Functional Questionnaire: (I = Independent and D = Dependent) ADLs: I  Bathing/Dressing- I  Meal Prep- I  Eating- I  Maintaining continence- I  Transferring/Ambulation- I  Managing Meds- I  Follow up appointments reviewed:  PCP Hospital f/u appt confirmed? No  Scheduled to see  Specialist Hospital f/u appt confirmed? Yes  Scheduled to see Dr. Haywood Lasso on 10/15/2021 @ 4 pm. Are transportation arrangements needed? No  If their condition worsens, is the pt aware to call PCP or go to the Emergency Dept.? Yes Was the patient provided with contact information for the PCP's office or ED? Yes Was to pt encouraged to call back with questions or concerns? Yes   Quinn Plowman RN,BSN,CCM RN Case Manager [place] [phone]

## 2021-10-18 DIAGNOSIS — R82998 Other abnormal findings in urine: Secondary | ICD-10-CM | POA: Diagnosis not present

## 2021-10-18 DIAGNOSIS — D631 Anemia in chronic kidney disease: Secondary | ICD-10-CM | POA: Diagnosis not present

## 2021-10-18 DIAGNOSIS — N2581 Secondary hyperparathyroidism of renal origin: Secondary | ICD-10-CM | POA: Diagnosis not present

## 2021-10-18 DIAGNOSIS — Z23 Encounter for immunization: Secondary | ICD-10-CM | POA: Diagnosis not present

## 2021-10-18 DIAGNOSIS — N186 End stage renal disease: Secondary | ICD-10-CM | POA: Diagnosis not present

## 2021-10-18 DIAGNOSIS — Z79899 Other long term (current) drug therapy: Secondary | ICD-10-CM | POA: Diagnosis not present

## 2021-10-18 DIAGNOSIS — E875 Hyperkalemia: Secondary | ICD-10-CM | POA: Diagnosis not present

## 2021-10-18 DIAGNOSIS — Z992 Dependence on renal dialysis: Secondary | ICD-10-CM | POA: Diagnosis not present

## 2021-10-18 DIAGNOSIS — D509 Iron deficiency anemia, unspecified: Secondary | ICD-10-CM | POA: Diagnosis not present

## 2021-10-19 DIAGNOSIS — Z992 Dependence on renal dialysis: Secondary | ICD-10-CM | POA: Diagnosis not present

## 2021-10-19 DIAGNOSIS — D509 Iron deficiency anemia, unspecified: Secondary | ICD-10-CM | POA: Diagnosis not present

## 2021-10-19 DIAGNOSIS — Z79899 Other long term (current) drug therapy: Secondary | ICD-10-CM | POA: Diagnosis not present

## 2021-10-19 DIAGNOSIS — N186 End stage renal disease: Secondary | ICD-10-CM | POA: Diagnosis not present

## 2021-10-19 DIAGNOSIS — D631 Anemia in chronic kidney disease: Secondary | ICD-10-CM | POA: Diagnosis not present

## 2021-10-19 DIAGNOSIS — R82998 Other abnormal findings in urine: Secondary | ICD-10-CM | POA: Diagnosis not present

## 2021-10-19 DIAGNOSIS — N2581 Secondary hyperparathyroidism of renal origin: Secondary | ICD-10-CM | POA: Diagnosis not present

## 2021-10-19 DIAGNOSIS — E875 Hyperkalemia: Secondary | ICD-10-CM | POA: Diagnosis not present

## 2021-10-19 DIAGNOSIS — Z23 Encounter for immunization: Secondary | ICD-10-CM | POA: Diagnosis not present

## 2021-10-20 DIAGNOSIS — D509 Iron deficiency anemia, unspecified: Secondary | ICD-10-CM | POA: Diagnosis not present

## 2021-10-20 DIAGNOSIS — Z79899 Other long term (current) drug therapy: Secondary | ICD-10-CM | POA: Diagnosis not present

## 2021-10-20 DIAGNOSIS — N186 End stage renal disease: Secondary | ICD-10-CM | POA: Diagnosis not present

## 2021-10-20 DIAGNOSIS — R82998 Other abnormal findings in urine: Secondary | ICD-10-CM | POA: Diagnosis not present

## 2021-10-20 DIAGNOSIS — Z992 Dependence on renal dialysis: Secondary | ICD-10-CM | POA: Diagnosis not present

## 2021-10-20 DIAGNOSIS — E875 Hyperkalemia: Secondary | ICD-10-CM | POA: Diagnosis not present

## 2021-10-20 DIAGNOSIS — N2581 Secondary hyperparathyroidism of renal origin: Secondary | ICD-10-CM | POA: Diagnosis not present

## 2021-10-20 DIAGNOSIS — D631 Anemia in chronic kidney disease: Secondary | ICD-10-CM | POA: Diagnosis not present

## 2021-10-20 DIAGNOSIS — Z23 Encounter for immunization: Secondary | ICD-10-CM | POA: Diagnosis not present

## 2021-10-21 DIAGNOSIS — E875 Hyperkalemia: Secondary | ICD-10-CM | POA: Diagnosis not present

## 2021-10-21 DIAGNOSIS — Z992 Dependence on renal dialysis: Secondary | ICD-10-CM | POA: Diagnosis not present

## 2021-10-21 DIAGNOSIS — D509 Iron deficiency anemia, unspecified: Secondary | ICD-10-CM | POA: Diagnosis not present

## 2021-10-21 DIAGNOSIS — R82998 Other abnormal findings in urine: Secondary | ICD-10-CM | POA: Diagnosis not present

## 2021-10-21 DIAGNOSIS — Z23 Encounter for immunization: Secondary | ICD-10-CM | POA: Diagnosis not present

## 2021-10-21 DIAGNOSIS — N186 End stage renal disease: Secondary | ICD-10-CM | POA: Diagnosis not present

## 2021-10-21 DIAGNOSIS — Z79899 Other long term (current) drug therapy: Secondary | ICD-10-CM | POA: Diagnosis not present

## 2021-10-21 DIAGNOSIS — D631 Anemia in chronic kidney disease: Secondary | ICD-10-CM | POA: Diagnosis not present

## 2021-10-21 DIAGNOSIS — N2581 Secondary hyperparathyroidism of renal origin: Secondary | ICD-10-CM | POA: Diagnosis not present

## 2021-10-22 DIAGNOSIS — N186 End stage renal disease: Secondary | ICD-10-CM | POA: Diagnosis not present

## 2021-10-22 DIAGNOSIS — D631 Anemia in chronic kidney disease: Secondary | ICD-10-CM | POA: Diagnosis not present

## 2021-10-22 DIAGNOSIS — N2581 Secondary hyperparathyroidism of renal origin: Secondary | ICD-10-CM | POA: Diagnosis not present

## 2021-10-22 DIAGNOSIS — E875 Hyperkalemia: Secondary | ICD-10-CM | POA: Diagnosis not present

## 2021-10-22 DIAGNOSIS — Z992 Dependence on renal dialysis: Secondary | ICD-10-CM | POA: Diagnosis not present

## 2021-10-22 DIAGNOSIS — Z79899 Other long term (current) drug therapy: Secondary | ICD-10-CM | POA: Diagnosis not present

## 2021-10-22 DIAGNOSIS — R82998 Other abnormal findings in urine: Secondary | ICD-10-CM | POA: Diagnosis not present

## 2021-10-22 DIAGNOSIS — Z23 Encounter for immunization: Secondary | ICD-10-CM | POA: Diagnosis not present

## 2021-10-22 DIAGNOSIS — D509 Iron deficiency anemia, unspecified: Secondary | ICD-10-CM | POA: Diagnosis not present

## 2021-10-23 DIAGNOSIS — Z23 Encounter for immunization: Secondary | ICD-10-CM | POA: Diagnosis not present

## 2021-10-23 DIAGNOSIS — E875 Hyperkalemia: Secondary | ICD-10-CM | POA: Diagnosis not present

## 2021-10-23 DIAGNOSIS — N2581 Secondary hyperparathyroidism of renal origin: Secondary | ICD-10-CM | POA: Diagnosis not present

## 2021-10-23 DIAGNOSIS — N186 End stage renal disease: Secondary | ICD-10-CM | POA: Diagnosis not present

## 2021-10-23 DIAGNOSIS — D631 Anemia in chronic kidney disease: Secondary | ICD-10-CM | POA: Diagnosis not present

## 2021-10-23 DIAGNOSIS — Z992 Dependence on renal dialysis: Secondary | ICD-10-CM | POA: Diagnosis not present

## 2021-10-23 DIAGNOSIS — D509 Iron deficiency anemia, unspecified: Secondary | ICD-10-CM | POA: Diagnosis not present

## 2021-10-23 DIAGNOSIS — R82998 Other abnormal findings in urine: Secondary | ICD-10-CM | POA: Diagnosis not present

## 2021-10-23 DIAGNOSIS — Z79899 Other long term (current) drug therapy: Secondary | ICD-10-CM | POA: Diagnosis not present

## 2021-10-24 ENCOUNTER — Ambulatory Visit (HOSPITAL_COMMUNITY): Payer: Medicare Other

## 2021-10-24 DIAGNOSIS — Z79899 Other long term (current) drug therapy: Secondary | ICD-10-CM | POA: Diagnosis not present

## 2021-10-24 DIAGNOSIS — N186 End stage renal disease: Secondary | ICD-10-CM | POA: Diagnosis not present

## 2021-10-24 DIAGNOSIS — D509 Iron deficiency anemia, unspecified: Secondary | ICD-10-CM | POA: Diagnosis not present

## 2021-10-24 DIAGNOSIS — D631 Anemia in chronic kidney disease: Secondary | ICD-10-CM | POA: Diagnosis not present

## 2021-10-24 DIAGNOSIS — Z992 Dependence on renal dialysis: Secondary | ICD-10-CM | POA: Diagnosis not present

## 2021-10-24 DIAGNOSIS — N2581 Secondary hyperparathyroidism of renal origin: Secondary | ICD-10-CM | POA: Diagnosis not present

## 2021-10-24 DIAGNOSIS — R82998 Other abnormal findings in urine: Secondary | ICD-10-CM | POA: Diagnosis not present

## 2021-10-24 DIAGNOSIS — E875 Hyperkalemia: Secondary | ICD-10-CM | POA: Diagnosis not present

## 2021-10-24 DIAGNOSIS — Z23 Encounter for immunization: Secondary | ICD-10-CM | POA: Diagnosis not present

## 2021-10-25 DIAGNOSIS — N186 End stage renal disease: Secondary | ICD-10-CM | POA: Diagnosis not present

## 2021-10-25 DIAGNOSIS — N2581 Secondary hyperparathyroidism of renal origin: Secondary | ICD-10-CM | POA: Diagnosis not present

## 2021-10-25 DIAGNOSIS — Z992 Dependence on renal dialysis: Secondary | ICD-10-CM | POA: Diagnosis not present

## 2021-10-25 DIAGNOSIS — Z79899 Other long term (current) drug therapy: Secondary | ICD-10-CM | POA: Diagnosis not present

## 2021-10-25 DIAGNOSIS — D509 Iron deficiency anemia, unspecified: Secondary | ICD-10-CM | POA: Diagnosis not present

## 2021-10-25 DIAGNOSIS — D631 Anemia in chronic kidney disease: Secondary | ICD-10-CM | POA: Diagnosis not present

## 2021-10-25 DIAGNOSIS — Z23 Encounter for immunization: Secondary | ICD-10-CM | POA: Diagnosis not present

## 2021-10-25 DIAGNOSIS — R82998 Other abnormal findings in urine: Secondary | ICD-10-CM | POA: Diagnosis not present

## 2021-10-25 DIAGNOSIS — E875 Hyperkalemia: Secondary | ICD-10-CM | POA: Diagnosis not present

## 2021-10-26 DIAGNOSIS — Z79899 Other long term (current) drug therapy: Secondary | ICD-10-CM | POA: Diagnosis not present

## 2021-10-26 DIAGNOSIS — D631 Anemia in chronic kidney disease: Secondary | ICD-10-CM | POA: Diagnosis not present

## 2021-10-26 DIAGNOSIS — Z23 Encounter for immunization: Secondary | ICD-10-CM | POA: Diagnosis not present

## 2021-10-26 DIAGNOSIS — D509 Iron deficiency anemia, unspecified: Secondary | ICD-10-CM | POA: Diagnosis not present

## 2021-10-26 DIAGNOSIS — Z992 Dependence on renal dialysis: Secondary | ICD-10-CM | POA: Diagnosis not present

## 2021-10-26 DIAGNOSIS — R82998 Other abnormal findings in urine: Secondary | ICD-10-CM | POA: Diagnosis not present

## 2021-10-26 DIAGNOSIS — N2581 Secondary hyperparathyroidism of renal origin: Secondary | ICD-10-CM | POA: Diagnosis not present

## 2021-10-26 DIAGNOSIS — N186 End stage renal disease: Secondary | ICD-10-CM | POA: Diagnosis not present

## 2021-10-26 DIAGNOSIS — E875 Hyperkalemia: Secondary | ICD-10-CM | POA: Diagnosis not present

## 2021-10-27 DIAGNOSIS — Z23 Encounter for immunization: Secondary | ICD-10-CM | POA: Diagnosis not present

## 2021-10-27 DIAGNOSIS — Z992 Dependence on renal dialysis: Secondary | ICD-10-CM | POA: Diagnosis not present

## 2021-10-27 DIAGNOSIS — D631 Anemia in chronic kidney disease: Secondary | ICD-10-CM | POA: Diagnosis not present

## 2021-10-27 DIAGNOSIS — N186 End stage renal disease: Secondary | ICD-10-CM | POA: Diagnosis not present

## 2021-10-27 DIAGNOSIS — Z79899 Other long term (current) drug therapy: Secondary | ICD-10-CM | POA: Diagnosis not present

## 2021-10-27 DIAGNOSIS — D509 Iron deficiency anemia, unspecified: Secondary | ICD-10-CM | POA: Diagnosis not present

## 2021-10-27 DIAGNOSIS — E875 Hyperkalemia: Secondary | ICD-10-CM | POA: Diagnosis not present

## 2021-10-27 DIAGNOSIS — N2581 Secondary hyperparathyroidism of renal origin: Secondary | ICD-10-CM | POA: Diagnosis not present

## 2021-10-27 DIAGNOSIS — R82998 Other abnormal findings in urine: Secondary | ICD-10-CM | POA: Diagnosis not present

## 2021-10-28 DIAGNOSIS — D631 Anemia in chronic kidney disease: Secondary | ICD-10-CM | POA: Diagnosis not present

## 2021-10-28 DIAGNOSIS — N2581 Secondary hyperparathyroidism of renal origin: Secondary | ICD-10-CM | POA: Diagnosis not present

## 2021-10-28 DIAGNOSIS — Z23 Encounter for immunization: Secondary | ICD-10-CM | POA: Diagnosis not present

## 2021-10-28 DIAGNOSIS — N186 End stage renal disease: Secondary | ICD-10-CM | POA: Diagnosis not present

## 2021-10-28 DIAGNOSIS — D509 Iron deficiency anemia, unspecified: Secondary | ICD-10-CM | POA: Diagnosis not present

## 2021-10-28 DIAGNOSIS — E875 Hyperkalemia: Secondary | ICD-10-CM | POA: Diagnosis not present

## 2021-10-28 DIAGNOSIS — R82998 Other abnormal findings in urine: Secondary | ICD-10-CM | POA: Diagnosis not present

## 2021-10-28 DIAGNOSIS — Z79899 Other long term (current) drug therapy: Secondary | ICD-10-CM | POA: Diagnosis not present

## 2021-10-28 DIAGNOSIS — Z992 Dependence on renal dialysis: Secondary | ICD-10-CM | POA: Diagnosis not present

## 2021-10-29 DIAGNOSIS — Z23 Encounter for immunization: Secondary | ICD-10-CM | POA: Diagnosis not present

## 2021-10-29 DIAGNOSIS — R82998 Other abnormal findings in urine: Secondary | ICD-10-CM | POA: Diagnosis not present

## 2021-10-29 DIAGNOSIS — Z79899 Other long term (current) drug therapy: Secondary | ICD-10-CM | POA: Diagnosis not present

## 2021-10-29 DIAGNOSIS — D509 Iron deficiency anemia, unspecified: Secondary | ICD-10-CM | POA: Diagnosis not present

## 2021-10-29 DIAGNOSIS — E875 Hyperkalemia: Secondary | ICD-10-CM | POA: Diagnosis not present

## 2021-10-29 DIAGNOSIS — N2581 Secondary hyperparathyroidism of renal origin: Secondary | ICD-10-CM | POA: Diagnosis not present

## 2021-10-29 DIAGNOSIS — Z992 Dependence on renal dialysis: Secondary | ICD-10-CM | POA: Diagnosis not present

## 2021-10-29 DIAGNOSIS — N186 End stage renal disease: Secondary | ICD-10-CM | POA: Diagnosis not present

## 2021-10-29 DIAGNOSIS — D631 Anemia in chronic kidney disease: Secondary | ICD-10-CM | POA: Diagnosis not present

## 2021-10-30 DIAGNOSIS — D631 Anemia in chronic kidney disease: Secondary | ICD-10-CM | POA: Diagnosis not present

## 2021-10-30 DIAGNOSIS — N2581 Secondary hyperparathyroidism of renal origin: Secondary | ICD-10-CM | POA: Diagnosis not present

## 2021-10-30 DIAGNOSIS — D509 Iron deficiency anemia, unspecified: Secondary | ICD-10-CM | POA: Diagnosis not present

## 2021-10-30 DIAGNOSIS — E875 Hyperkalemia: Secondary | ICD-10-CM | POA: Diagnosis not present

## 2021-10-30 DIAGNOSIS — Z23 Encounter for immunization: Secondary | ICD-10-CM | POA: Diagnosis not present

## 2021-10-30 DIAGNOSIS — R82998 Other abnormal findings in urine: Secondary | ICD-10-CM | POA: Diagnosis not present

## 2021-10-30 DIAGNOSIS — Z79899 Other long term (current) drug therapy: Secondary | ICD-10-CM | POA: Diagnosis not present

## 2021-10-30 DIAGNOSIS — Z992 Dependence on renal dialysis: Secondary | ICD-10-CM | POA: Diagnosis not present

## 2021-10-30 DIAGNOSIS — N186 End stage renal disease: Secondary | ICD-10-CM | POA: Diagnosis not present

## 2021-10-31 DIAGNOSIS — D509 Iron deficiency anemia, unspecified: Secondary | ICD-10-CM | POA: Diagnosis not present

## 2021-10-31 DIAGNOSIS — N2581 Secondary hyperparathyroidism of renal origin: Secondary | ICD-10-CM | POA: Diagnosis not present

## 2021-10-31 DIAGNOSIS — Z79899 Other long term (current) drug therapy: Secondary | ICD-10-CM | POA: Diagnosis not present

## 2021-10-31 DIAGNOSIS — Z23 Encounter for immunization: Secondary | ICD-10-CM | POA: Diagnosis not present

## 2021-10-31 DIAGNOSIS — Z992 Dependence on renal dialysis: Secondary | ICD-10-CM | POA: Diagnosis not present

## 2021-10-31 DIAGNOSIS — D631 Anemia in chronic kidney disease: Secondary | ICD-10-CM | POA: Diagnosis not present

## 2021-10-31 DIAGNOSIS — E875 Hyperkalemia: Secondary | ICD-10-CM | POA: Diagnosis not present

## 2021-10-31 DIAGNOSIS — R82998 Other abnormal findings in urine: Secondary | ICD-10-CM | POA: Diagnosis not present

## 2021-10-31 DIAGNOSIS — N186 End stage renal disease: Secondary | ICD-10-CM | POA: Diagnosis not present

## 2021-11-01 DIAGNOSIS — Z992 Dependence on renal dialysis: Secondary | ICD-10-CM | POA: Diagnosis not present

## 2021-11-01 DIAGNOSIS — N2581 Secondary hyperparathyroidism of renal origin: Secondary | ICD-10-CM | POA: Diagnosis not present

## 2021-11-01 DIAGNOSIS — E1129 Type 2 diabetes mellitus with other diabetic kidney complication: Secondary | ICD-10-CM | POA: Diagnosis not present

## 2021-11-01 DIAGNOSIS — D631 Anemia in chronic kidney disease: Secondary | ICD-10-CM | POA: Diagnosis not present

## 2021-11-01 DIAGNOSIS — N2589 Other disorders resulting from impaired renal tubular function: Secondary | ICD-10-CM | POA: Diagnosis not present

## 2021-11-01 DIAGNOSIS — R82998 Other abnormal findings in urine: Secondary | ICD-10-CM | POA: Diagnosis not present

## 2021-11-01 DIAGNOSIS — Z23 Encounter for immunization: Secondary | ICD-10-CM | POA: Diagnosis not present

## 2021-11-01 DIAGNOSIS — N186 End stage renal disease: Secondary | ICD-10-CM | POA: Diagnosis not present

## 2021-11-01 DIAGNOSIS — D509 Iron deficiency anemia, unspecified: Secondary | ICD-10-CM | POA: Diagnosis not present

## 2021-11-02 DIAGNOSIS — D631 Anemia in chronic kidney disease: Secondary | ICD-10-CM | POA: Diagnosis not present

## 2021-11-02 DIAGNOSIS — E1129 Type 2 diabetes mellitus with other diabetic kidney complication: Secondary | ICD-10-CM | POA: Diagnosis not present

## 2021-11-02 DIAGNOSIS — Z23 Encounter for immunization: Secondary | ICD-10-CM | POA: Diagnosis not present

## 2021-11-02 DIAGNOSIS — D509 Iron deficiency anemia, unspecified: Secondary | ICD-10-CM | POA: Diagnosis not present

## 2021-11-02 DIAGNOSIS — Z992 Dependence on renal dialysis: Secondary | ICD-10-CM | POA: Diagnosis not present

## 2021-11-02 DIAGNOSIS — N186 End stage renal disease: Secondary | ICD-10-CM | POA: Diagnosis not present

## 2021-11-02 DIAGNOSIS — N2581 Secondary hyperparathyroidism of renal origin: Secondary | ICD-10-CM | POA: Diagnosis not present

## 2021-11-02 DIAGNOSIS — N2589 Other disorders resulting from impaired renal tubular function: Secondary | ICD-10-CM | POA: Diagnosis not present

## 2021-11-02 DIAGNOSIS — R82998 Other abnormal findings in urine: Secondary | ICD-10-CM | POA: Diagnosis not present

## 2021-11-03 DIAGNOSIS — D631 Anemia in chronic kidney disease: Secondary | ICD-10-CM | POA: Diagnosis not present

## 2021-11-03 DIAGNOSIS — E1129 Type 2 diabetes mellitus with other diabetic kidney complication: Secondary | ICD-10-CM | POA: Diagnosis not present

## 2021-11-03 DIAGNOSIS — Z992 Dependence on renal dialysis: Secondary | ICD-10-CM | POA: Diagnosis not present

## 2021-11-03 DIAGNOSIS — Z23 Encounter for immunization: Secondary | ICD-10-CM | POA: Diagnosis not present

## 2021-11-03 DIAGNOSIS — R82998 Other abnormal findings in urine: Secondary | ICD-10-CM | POA: Diagnosis not present

## 2021-11-03 DIAGNOSIS — D509 Iron deficiency anemia, unspecified: Secondary | ICD-10-CM | POA: Diagnosis not present

## 2021-11-03 DIAGNOSIS — N2589 Other disorders resulting from impaired renal tubular function: Secondary | ICD-10-CM | POA: Diagnosis not present

## 2021-11-03 DIAGNOSIS — N186 End stage renal disease: Secondary | ICD-10-CM | POA: Diagnosis not present

## 2021-11-03 DIAGNOSIS — N2581 Secondary hyperparathyroidism of renal origin: Secondary | ICD-10-CM | POA: Diagnosis not present

## 2021-11-04 DIAGNOSIS — D631 Anemia in chronic kidney disease: Secondary | ICD-10-CM | POA: Diagnosis not present

## 2021-11-04 DIAGNOSIS — N2581 Secondary hyperparathyroidism of renal origin: Secondary | ICD-10-CM | POA: Diagnosis not present

## 2021-11-04 DIAGNOSIS — Z992 Dependence on renal dialysis: Secondary | ICD-10-CM | POA: Diagnosis not present

## 2021-11-04 DIAGNOSIS — Z23 Encounter for immunization: Secondary | ICD-10-CM | POA: Diagnosis not present

## 2021-11-04 DIAGNOSIS — N186 End stage renal disease: Secondary | ICD-10-CM | POA: Diagnosis not present

## 2021-11-04 DIAGNOSIS — R82998 Other abnormal findings in urine: Secondary | ICD-10-CM | POA: Diagnosis not present

## 2021-11-04 DIAGNOSIS — D509 Iron deficiency anemia, unspecified: Secondary | ICD-10-CM | POA: Diagnosis not present

## 2021-11-04 DIAGNOSIS — E1129 Type 2 diabetes mellitus with other diabetic kidney complication: Secondary | ICD-10-CM | POA: Diagnosis not present

## 2021-11-04 DIAGNOSIS — N2589 Other disorders resulting from impaired renal tubular function: Secondary | ICD-10-CM | POA: Diagnosis not present

## 2021-11-05 DIAGNOSIS — E1129 Type 2 diabetes mellitus with other diabetic kidney complication: Secondary | ICD-10-CM | POA: Diagnosis not present

## 2021-11-05 DIAGNOSIS — Z992 Dependence on renal dialysis: Secondary | ICD-10-CM | POA: Diagnosis not present

## 2021-11-05 DIAGNOSIS — Z23 Encounter for immunization: Secondary | ICD-10-CM | POA: Diagnosis not present

## 2021-11-05 DIAGNOSIS — N2581 Secondary hyperparathyroidism of renal origin: Secondary | ICD-10-CM | POA: Diagnosis not present

## 2021-11-05 DIAGNOSIS — R82998 Other abnormal findings in urine: Secondary | ICD-10-CM | POA: Diagnosis not present

## 2021-11-05 DIAGNOSIS — N186 End stage renal disease: Secondary | ICD-10-CM | POA: Diagnosis not present

## 2021-11-05 DIAGNOSIS — N2589 Other disorders resulting from impaired renal tubular function: Secondary | ICD-10-CM | POA: Diagnosis not present

## 2021-11-05 DIAGNOSIS — D509 Iron deficiency anemia, unspecified: Secondary | ICD-10-CM | POA: Diagnosis not present

## 2021-11-05 DIAGNOSIS — D631 Anemia in chronic kidney disease: Secondary | ICD-10-CM | POA: Diagnosis not present

## 2021-11-06 DIAGNOSIS — N2589 Other disorders resulting from impaired renal tubular function: Secondary | ICD-10-CM | POA: Diagnosis not present

## 2021-11-06 DIAGNOSIS — Z23 Encounter for immunization: Secondary | ICD-10-CM | POA: Diagnosis not present

## 2021-11-06 DIAGNOSIS — Z992 Dependence on renal dialysis: Secondary | ICD-10-CM | POA: Diagnosis not present

## 2021-11-06 DIAGNOSIS — N186 End stage renal disease: Secondary | ICD-10-CM | POA: Diagnosis not present

## 2021-11-06 DIAGNOSIS — D509 Iron deficiency anemia, unspecified: Secondary | ICD-10-CM | POA: Diagnosis not present

## 2021-11-06 DIAGNOSIS — D631 Anemia in chronic kidney disease: Secondary | ICD-10-CM | POA: Diagnosis not present

## 2021-11-06 DIAGNOSIS — E1129 Type 2 diabetes mellitus with other diabetic kidney complication: Secondary | ICD-10-CM | POA: Diagnosis not present

## 2021-11-06 DIAGNOSIS — R82998 Other abnormal findings in urine: Secondary | ICD-10-CM | POA: Diagnosis not present

## 2021-11-06 DIAGNOSIS — N2581 Secondary hyperparathyroidism of renal origin: Secondary | ICD-10-CM | POA: Diagnosis not present

## 2021-11-07 DIAGNOSIS — Z23 Encounter for immunization: Secondary | ICD-10-CM | POA: Diagnosis not present

## 2021-11-07 DIAGNOSIS — D631 Anemia in chronic kidney disease: Secondary | ICD-10-CM | POA: Diagnosis not present

## 2021-11-07 DIAGNOSIS — Z992 Dependence on renal dialysis: Secondary | ICD-10-CM | POA: Diagnosis not present

## 2021-11-07 DIAGNOSIS — N186 End stage renal disease: Secondary | ICD-10-CM | POA: Diagnosis not present

## 2021-11-07 DIAGNOSIS — N2589 Other disorders resulting from impaired renal tubular function: Secondary | ICD-10-CM | POA: Diagnosis not present

## 2021-11-07 DIAGNOSIS — E1129 Type 2 diabetes mellitus with other diabetic kidney complication: Secondary | ICD-10-CM | POA: Diagnosis not present

## 2021-11-07 DIAGNOSIS — R82998 Other abnormal findings in urine: Secondary | ICD-10-CM | POA: Diagnosis not present

## 2021-11-07 DIAGNOSIS — D509 Iron deficiency anemia, unspecified: Secondary | ICD-10-CM | POA: Diagnosis not present

## 2021-11-07 DIAGNOSIS — N2581 Secondary hyperparathyroidism of renal origin: Secondary | ICD-10-CM | POA: Diagnosis not present

## 2021-11-08 DIAGNOSIS — Z23 Encounter for immunization: Secondary | ICD-10-CM | POA: Diagnosis not present

## 2021-11-08 DIAGNOSIS — Z992 Dependence on renal dialysis: Secondary | ICD-10-CM | POA: Diagnosis not present

## 2021-11-08 DIAGNOSIS — D631 Anemia in chronic kidney disease: Secondary | ICD-10-CM | POA: Diagnosis not present

## 2021-11-08 DIAGNOSIS — N2581 Secondary hyperparathyroidism of renal origin: Secondary | ICD-10-CM | POA: Diagnosis not present

## 2021-11-08 DIAGNOSIS — D509 Iron deficiency anemia, unspecified: Secondary | ICD-10-CM | POA: Diagnosis not present

## 2021-11-08 DIAGNOSIS — E1129 Type 2 diabetes mellitus with other diabetic kidney complication: Secondary | ICD-10-CM | POA: Diagnosis not present

## 2021-11-08 DIAGNOSIS — R82998 Other abnormal findings in urine: Secondary | ICD-10-CM | POA: Diagnosis not present

## 2021-11-08 DIAGNOSIS — N2589 Other disorders resulting from impaired renal tubular function: Secondary | ICD-10-CM | POA: Diagnosis not present

## 2021-11-08 DIAGNOSIS — N186 End stage renal disease: Secondary | ICD-10-CM | POA: Diagnosis not present

## 2021-11-09 DIAGNOSIS — Z23 Encounter for immunization: Secondary | ICD-10-CM | POA: Diagnosis not present

## 2021-11-09 DIAGNOSIS — N186 End stage renal disease: Secondary | ICD-10-CM | POA: Diagnosis not present

## 2021-11-09 DIAGNOSIS — Z992 Dependence on renal dialysis: Secondary | ICD-10-CM | POA: Diagnosis not present

## 2021-11-09 DIAGNOSIS — R82998 Other abnormal findings in urine: Secondary | ICD-10-CM | POA: Diagnosis not present

## 2021-11-09 DIAGNOSIS — D509 Iron deficiency anemia, unspecified: Secondary | ICD-10-CM | POA: Diagnosis not present

## 2021-11-09 DIAGNOSIS — N2589 Other disorders resulting from impaired renal tubular function: Secondary | ICD-10-CM | POA: Diagnosis not present

## 2021-11-09 DIAGNOSIS — N2581 Secondary hyperparathyroidism of renal origin: Secondary | ICD-10-CM | POA: Diagnosis not present

## 2021-11-09 DIAGNOSIS — E1129 Type 2 diabetes mellitus with other diabetic kidney complication: Secondary | ICD-10-CM | POA: Diagnosis not present

## 2021-11-09 DIAGNOSIS — D631 Anemia in chronic kidney disease: Secondary | ICD-10-CM | POA: Diagnosis not present

## 2021-11-10 DIAGNOSIS — D509 Iron deficiency anemia, unspecified: Secondary | ICD-10-CM | POA: Diagnosis not present

## 2021-11-10 DIAGNOSIS — N2581 Secondary hyperparathyroidism of renal origin: Secondary | ICD-10-CM | POA: Diagnosis not present

## 2021-11-10 DIAGNOSIS — N186 End stage renal disease: Secondary | ICD-10-CM | POA: Diagnosis not present

## 2021-11-10 DIAGNOSIS — Z23 Encounter for immunization: Secondary | ICD-10-CM | POA: Diagnosis not present

## 2021-11-10 DIAGNOSIS — N2589 Other disorders resulting from impaired renal tubular function: Secondary | ICD-10-CM | POA: Diagnosis not present

## 2021-11-10 DIAGNOSIS — E1129 Type 2 diabetes mellitus with other diabetic kidney complication: Secondary | ICD-10-CM | POA: Diagnosis not present

## 2021-11-10 DIAGNOSIS — Z992 Dependence on renal dialysis: Secondary | ICD-10-CM | POA: Diagnosis not present

## 2021-11-10 DIAGNOSIS — R82998 Other abnormal findings in urine: Secondary | ICD-10-CM | POA: Diagnosis not present

## 2021-11-10 DIAGNOSIS — D631 Anemia in chronic kidney disease: Secondary | ICD-10-CM | POA: Diagnosis not present

## 2021-11-11 DIAGNOSIS — N186 End stage renal disease: Secondary | ICD-10-CM | POA: Diagnosis not present

## 2021-11-11 DIAGNOSIS — E1129 Type 2 diabetes mellitus with other diabetic kidney complication: Secondary | ICD-10-CM | POA: Diagnosis not present

## 2021-11-11 DIAGNOSIS — R82998 Other abnormal findings in urine: Secondary | ICD-10-CM | POA: Diagnosis not present

## 2021-11-11 DIAGNOSIS — D509 Iron deficiency anemia, unspecified: Secondary | ICD-10-CM | POA: Diagnosis not present

## 2021-11-11 DIAGNOSIS — N2589 Other disorders resulting from impaired renal tubular function: Secondary | ICD-10-CM | POA: Diagnosis not present

## 2021-11-11 DIAGNOSIS — Z23 Encounter for immunization: Secondary | ICD-10-CM | POA: Diagnosis not present

## 2021-11-11 DIAGNOSIS — N2581 Secondary hyperparathyroidism of renal origin: Secondary | ICD-10-CM | POA: Diagnosis not present

## 2021-11-11 DIAGNOSIS — Z992 Dependence on renal dialysis: Secondary | ICD-10-CM | POA: Diagnosis not present

## 2021-11-11 DIAGNOSIS — D631 Anemia in chronic kidney disease: Secondary | ICD-10-CM | POA: Diagnosis not present

## 2021-11-12 ENCOUNTER — Other Ambulatory Visit: Payer: Self-pay | Admitting: Internal Medicine

## 2021-11-12 DIAGNOSIS — R82998 Other abnormal findings in urine: Secondary | ICD-10-CM | POA: Diagnosis not present

## 2021-11-12 DIAGNOSIS — N186 End stage renal disease: Secondary | ICD-10-CM | POA: Diagnosis not present

## 2021-11-12 DIAGNOSIS — Z23 Encounter for immunization: Secondary | ICD-10-CM | POA: Diagnosis not present

## 2021-11-12 DIAGNOSIS — N2581 Secondary hyperparathyroidism of renal origin: Secondary | ICD-10-CM | POA: Diagnosis not present

## 2021-11-12 DIAGNOSIS — E1169 Type 2 diabetes mellitus with other specified complication: Secondary | ICD-10-CM

## 2021-11-12 DIAGNOSIS — E1129 Type 2 diabetes mellitus with other diabetic kidney complication: Secondary | ICD-10-CM | POA: Diagnosis not present

## 2021-11-12 DIAGNOSIS — N2589 Other disorders resulting from impaired renal tubular function: Secondary | ICD-10-CM | POA: Diagnosis not present

## 2021-11-12 DIAGNOSIS — D631 Anemia in chronic kidney disease: Secondary | ICD-10-CM | POA: Diagnosis not present

## 2021-11-12 DIAGNOSIS — Z992 Dependence on renal dialysis: Secondary | ICD-10-CM | POA: Diagnosis not present

## 2021-11-12 DIAGNOSIS — D509 Iron deficiency anemia, unspecified: Secondary | ICD-10-CM | POA: Diagnosis not present

## 2021-11-13 DIAGNOSIS — R82998 Other abnormal findings in urine: Secondary | ICD-10-CM | POA: Diagnosis not present

## 2021-11-13 DIAGNOSIS — N2581 Secondary hyperparathyroidism of renal origin: Secondary | ICD-10-CM | POA: Diagnosis not present

## 2021-11-13 DIAGNOSIS — E1129 Type 2 diabetes mellitus with other diabetic kidney complication: Secondary | ICD-10-CM | POA: Diagnosis not present

## 2021-11-13 DIAGNOSIS — Z23 Encounter for immunization: Secondary | ICD-10-CM | POA: Diagnosis not present

## 2021-11-13 DIAGNOSIS — N2589 Other disorders resulting from impaired renal tubular function: Secondary | ICD-10-CM | POA: Diagnosis not present

## 2021-11-13 DIAGNOSIS — D509 Iron deficiency anemia, unspecified: Secondary | ICD-10-CM | POA: Diagnosis not present

## 2021-11-13 DIAGNOSIS — D631 Anemia in chronic kidney disease: Secondary | ICD-10-CM | POA: Diagnosis not present

## 2021-11-13 DIAGNOSIS — Z992 Dependence on renal dialysis: Secondary | ICD-10-CM | POA: Diagnosis not present

## 2021-11-13 DIAGNOSIS — N186 End stage renal disease: Secondary | ICD-10-CM | POA: Diagnosis not present

## 2021-11-14 DIAGNOSIS — D631 Anemia in chronic kidney disease: Secondary | ICD-10-CM | POA: Diagnosis not present

## 2021-11-14 DIAGNOSIS — D509 Iron deficiency anemia, unspecified: Secondary | ICD-10-CM | POA: Diagnosis not present

## 2021-11-14 DIAGNOSIS — N2589 Other disorders resulting from impaired renal tubular function: Secondary | ICD-10-CM | POA: Diagnosis not present

## 2021-11-14 DIAGNOSIS — N186 End stage renal disease: Secondary | ICD-10-CM | POA: Diagnosis not present

## 2021-11-14 DIAGNOSIS — Z23 Encounter for immunization: Secondary | ICD-10-CM | POA: Diagnosis not present

## 2021-11-14 DIAGNOSIS — R82998 Other abnormal findings in urine: Secondary | ICD-10-CM | POA: Diagnosis not present

## 2021-11-14 DIAGNOSIS — Z992 Dependence on renal dialysis: Secondary | ICD-10-CM | POA: Diagnosis not present

## 2021-11-14 DIAGNOSIS — E1129 Type 2 diabetes mellitus with other diabetic kidney complication: Secondary | ICD-10-CM | POA: Diagnosis not present

## 2021-11-14 DIAGNOSIS — N2581 Secondary hyperparathyroidism of renal origin: Secondary | ICD-10-CM | POA: Diagnosis not present

## 2021-11-15 DIAGNOSIS — N2589 Other disorders resulting from impaired renal tubular function: Secondary | ICD-10-CM | POA: Diagnosis not present

## 2021-11-15 DIAGNOSIS — R82998 Other abnormal findings in urine: Secondary | ICD-10-CM | POA: Diagnosis not present

## 2021-11-15 DIAGNOSIS — N186 End stage renal disease: Secondary | ICD-10-CM | POA: Diagnosis not present

## 2021-11-15 DIAGNOSIS — Z992 Dependence on renal dialysis: Secondary | ICD-10-CM | POA: Diagnosis not present

## 2021-11-15 DIAGNOSIS — D509 Iron deficiency anemia, unspecified: Secondary | ICD-10-CM | POA: Diagnosis not present

## 2021-11-15 DIAGNOSIS — N2581 Secondary hyperparathyroidism of renal origin: Secondary | ICD-10-CM | POA: Diagnosis not present

## 2021-11-15 DIAGNOSIS — Z23 Encounter for immunization: Secondary | ICD-10-CM | POA: Diagnosis not present

## 2021-11-15 DIAGNOSIS — E1129 Type 2 diabetes mellitus with other diabetic kidney complication: Secondary | ICD-10-CM | POA: Diagnosis not present

## 2021-11-15 DIAGNOSIS — D631 Anemia in chronic kidney disease: Secondary | ICD-10-CM | POA: Diagnosis not present

## 2021-11-16 DIAGNOSIS — D509 Iron deficiency anemia, unspecified: Secondary | ICD-10-CM | POA: Diagnosis not present

## 2021-11-16 DIAGNOSIS — Z23 Encounter for immunization: Secondary | ICD-10-CM | POA: Diagnosis not present

## 2021-11-16 DIAGNOSIS — N2589 Other disorders resulting from impaired renal tubular function: Secondary | ICD-10-CM | POA: Diagnosis not present

## 2021-11-16 DIAGNOSIS — D631 Anemia in chronic kidney disease: Secondary | ICD-10-CM | POA: Diagnosis not present

## 2021-11-16 DIAGNOSIS — R82998 Other abnormal findings in urine: Secondary | ICD-10-CM | POA: Diagnosis not present

## 2021-11-16 DIAGNOSIS — N186 End stage renal disease: Secondary | ICD-10-CM | POA: Diagnosis not present

## 2021-11-16 DIAGNOSIS — E1129 Type 2 diabetes mellitus with other diabetic kidney complication: Secondary | ICD-10-CM | POA: Diagnosis not present

## 2021-11-16 DIAGNOSIS — Z992 Dependence on renal dialysis: Secondary | ICD-10-CM | POA: Diagnosis not present

## 2021-11-16 DIAGNOSIS — N2581 Secondary hyperparathyroidism of renal origin: Secondary | ICD-10-CM | POA: Diagnosis not present

## 2021-11-17 DIAGNOSIS — E1129 Type 2 diabetes mellitus with other diabetic kidney complication: Secondary | ICD-10-CM | POA: Diagnosis not present

## 2021-11-17 DIAGNOSIS — N2589 Other disorders resulting from impaired renal tubular function: Secondary | ICD-10-CM | POA: Diagnosis not present

## 2021-11-17 DIAGNOSIS — D631 Anemia in chronic kidney disease: Secondary | ICD-10-CM | POA: Diagnosis not present

## 2021-11-17 DIAGNOSIS — Z23 Encounter for immunization: Secondary | ICD-10-CM | POA: Diagnosis not present

## 2021-11-17 DIAGNOSIS — N2581 Secondary hyperparathyroidism of renal origin: Secondary | ICD-10-CM | POA: Diagnosis not present

## 2021-11-17 DIAGNOSIS — N186 End stage renal disease: Secondary | ICD-10-CM | POA: Diagnosis not present

## 2021-11-17 DIAGNOSIS — R82998 Other abnormal findings in urine: Secondary | ICD-10-CM | POA: Diagnosis not present

## 2021-11-17 DIAGNOSIS — Z992 Dependence on renal dialysis: Secondary | ICD-10-CM | POA: Diagnosis not present

## 2021-11-17 DIAGNOSIS — D509 Iron deficiency anemia, unspecified: Secondary | ICD-10-CM | POA: Diagnosis not present

## 2021-11-18 DIAGNOSIS — Z992 Dependence on renal dialysis: Secondary | ICD-10-CM | POA: Diagnosis not present

## 2021-11-18 DIAGNOSIS — E1129 Type 2 diabetes mellitus with other diabetic kidney complication: Secondary | ICD-10-CM | POA: Diagnosis not present

## 2021-11-18 DIAGNOSIS — D509 Iron deficiency anemia, unspecified: Secondary | ICD-10-CM | POA: Diagnosis not present

## 2021-11-18 DIAGNOSIS — N186 End stage renal disease: Secondary | ICD-10-CM | POA: Diagnosis not present

## 2021-11-18 DIAGNOSIS — D631 Anemia in chronic kidney disease: Secondary | ICD-10-CM | POA: Diagnosis not present

## 2021-11-18 DIAGNOSIS — Z23 Encounter for immunization: Secondary | ICD-10-CM | POA: Diagnosis not present

## 2021-11-18 DIAGNOSIS — N2589 Other disorders resulting from impaired renal tubular function: Secondary | ICD-10-CM | POA: Diagnosis not present

## 2021-11-18 DIAGNOSIS — N2581 Secondary hyperparathyroidism of renal origin: Secondary | ICD-10-CM | POA: Diagnosis not present

## 2021-11-18 DIAGNOSIS — R82998 Other abnormal findings in urine: Secondary | ICD-10-CM | POA: Diagnosis not present

## 2021-11-19 DIAGNOSIS — D509 Iron deficiency anemia, unspecified: Secondary | ICD-10-CM | POA: Diagnosis not present

## 2021-11-19 DIAGNOSIS — Z23 Encounter for immunization: Secondary | ICD-10-CM | POA: Diagnosis not present

## 2021-11-19 DIAGNOSIS — N2589 Other disorders resulting from impaired renal tubular function: Secondary | ICD-10-CM | POA: Diagnosis not present

## 2021-11-19 DIAGNOSIS — N2581 Secondary hyperparathyroidism of renal origin: Secondary | ICD-10-CM | POA: Diagnosis not present

## 2021-11-19 DIAGNOSIS — Z992 Dependence on renal dialysis: Secondary | ICD-10-CM | POA: Diagnosis not present

## 2021-11-19 DIAGNOSIS — E1129 Type 2 diabetes mellitus with other diabetic kidney complication: Secondary | ICD-10-CM | POA: Diagnosis not present

## 2021-11-19 DIAGNOSIS — R82998 Other abnormal findings in urine: Secondary | ICD-10-CM | POA: Diagnosis not present

## 2021-11-19 DIAGNOSIS — N186 End stage renal disease: Secondary | ICD-10-CM | POA: Diagnosis not present

## 2021-11-19 DIAGNOSIS — D631 Anemia in chronic kidney disease: Secondary | ICD-10-CM | POA: Diagnosis not present

## 2021-11-20 DIAGNOSIS — D631 Anemia in chronic kidney disease: Secondary | ICD-10-CM | POA: Diagnosis not present

## 2021-11-20 DIAGNOSIS — R82998 Other abnormal findings in urine: Secondary | ICD-10-CM | POA: Diagnosis not present

## 2021-11-20 DIAGNOSIS — D509 Iron deficiency anemia, unspecified: Secondary | ICD-10-CM | POA: Diagnosis not present

## 2021-11-20 DIAGNOSIS — N2581 Secondary hyperparathyroidism of renal origin: Secondary | ICD-10-CM | POA: Diagnosis not present

## 2021-11-20 DIAGNOSIS — Z23 Encounter for immunization: Secondary | ICD-10-CM | POA: Diagnosis not present

## 2021-11-20 DIAGNOSIS — N186 End stage renal disease: Secondary | ICD-10-CM | POA: Diagnosis not present

## 2021-11-20 DIAGNOSIS — N2589 Other disorders resulting from impaired renal tubular function: Secondary | ICD-10-CM | POA: Diagnosis not present

## 2021-11-20 DIAGNOSIS — Z992 Dependence on renal dialysis: Secondary | ICD-10-CM | POA: Diagnosis not present

## 2021-11-20 DIAGNOSIS — E1129 Type 2 diabetes mellitus with other diabetic kidney complication: Secondary | ICD-10-CM | POA: Diagnosis not present

## 2021-11-21 DIAGNOSIS — N2589 Other disorders resulting from impaired renal tubular function: Secondary | ICD-10-CM | POA: Diagnosis not present

## 2021-11-21 DIAGNOSIS — Z992 Dependence on renal dialysis: Secondary | ICD-10-CM | POA: Diagnosis not present

## 2021-11-21 DIAGNOSIS — D509 Iron deficiency anemia, unspecified: Secondary | ICD-10-CM | POA: Diagnosis not present

## 2021-11-21 DIAGNOSIS — Z23 Encounter for immunization: Secondary | ICD-10-CM | POA: Diagnosis not present

## 2021-11-21 DIAGNOSIS — N186 End stage renal disease: Secondary | ICD-10-CM | POA: Diagnosis not present

## 2021-11-21 DIAGNOSIS — N2581 Secondary hyperparathyroidism of renal origin: Secondary | ICD-10-CM | POA: Diagnosis not present

## 2021-11-21 DIAGNOSIS — D631 Anemia in chronic kidney disease: Secondary | ICD-10-CM | POA: Diagnosis not present

## 2021-11-21 DIAGNOSIS — R82998 Other abnormal findings in urine: Secondary | ICD-10-CM | POA: Diagnosis not present

## 2021-11-21 DIAGNOSIS — E1129 Type 2 diabetes mellitus with other diabetic kidney complication: Secondary | ICD-10-CM | POA: Diagnosis not present

## 2021-11-22 DIAGNOSIS — N186 End stage renal disease: Secondary | ICD-10-CM | POA: Diagnosis not present

## 2021-11-22 DIAGNOSIS — Z992 Dependence on renal dialysis: Secondary | ICD-10-CM | POA: Diagnosis not present

## 2021-11-22 DIAGNOSIS — E1129 Type 2 diabetes mellitus with other diabetic kidney complication: Secondary | ICD-10-CM | POA: Diagnosis not present

## 2021-11-22 DIAGNOSIS — N2581 Secondary hyperparathyroidism of renal origin: Secondary | ICD-10-CM | POA: Diagnosis not present

## 2021-11-22 DIAGNOSIS — R82998 Other abnormal findings in urine: Secondary | ICD-10-CM | POA: Diagnosis not present

## 2021-11-22 DIAGNOSIS — D631 Anemia in chronic kidney disease: Secondary | ICD-10-CM | POA: Diagnosis not present

## 2021-11-22 DIAGNOSIS — D509 Iron deficiency anemia, unspecified: Secondary | ICD-10-CM | POA: Diagnosis not present

## 2021-11-22 DIAGNOSIS — N2589 Other disorders resulting from impaired renal tubular function: Secondary | ICD-10-CM | POA: Diagnosis not present

## 2021-11-22 DIAGNOSIS — Z23 Encounter for immunization: Secondary | ICD-10-CM | POA: Diagnosis not present

## 2021-11-23 DIAGNOSIS — D509 Iron deficiency anemia, unspecified: Secondary | ICD-10-CM | POA: Diagnosis not present

## 2021-11-23 DIAGNOSIS — R82998 Other abnormal findings in urine: Secondary | ICD-10-CM | POA: Diagnosis not present

## 2021-11-23 DIAGNOSIS — Z23 Encounter for immunization: Secondary | ICD-10-CM | POA: Diagnosis not present

## 2021-11-23 DIAGNOSIS — N186 End stage renal disease: Secondary | ICD-10-CM | POA: Diagnosis not present

## 2021-11-23 DIAGNOSIS — Z992 Dependence on renal dialysis: Secondary | ICD-10-CM | POA: Diagnosis not present

## 2021-11-23 DIAGNOSIS — D631 Anemia in chronic kidney disease: Secondary | ICD-10-CM | POA: Diagnosis not present

## 2021-11-23 DIAGNOSIS — N2581 Secondary hyperparathyroidism of renal origin: Secondary | ICD-10-CM | POA: Diagnosis not present

## 2021-11-23 DIAGNOSIS — N2589 Other disorders resulting from impaired renal tubular function: Secondary | ICD-10-CM | POA: Diagnosis not present

## 2021-11-23 DIAGNOSIS — E1129 Type 2 diabetes mellitus with other diabetic kidney complication: Secondary | ICD-10-CM | POA: Diagnosis not present

## 2021-11-24 DIAGNOSIS — R82998 Other abnormal findings in urine: Secondary | ICD-10-CM | POA: Diagnosis not present

## 2021-11-24 DIAGNOSIS — Z23 Encounter for immunization: Secondary | ICD-10-CM | POA: Diagnosis not present

## 2021-11-24 DIAGNOSIS — N2581 Secondary hyperparathyroidism of renal origin: Secondary | ICD-10-CM | POA: Diagnosis not present

## 2021-11-24 DIAGNOSIS — Z992 Dependence on renal dialysis: Secondary | ICD-10-CM | POA: Diagnosis not present

## 2021-11-24 DIAGNOSIS — N186 End stage renal disease: Secondary | ICD-10-CM | POA: Diagnosis not present

## 2021-11-24 DIAGNOSIS — E1129 Type 2 diabetes mellitus with other diabetic kidney complication: Secondary | ICD-10-CM | POA: Diagnosis not present

## 2021-11-24 DIAGNOSIS — D631 Anemia in chronic kidney disease: Secondary | ICD-10-CM | POA: Diagnosis not present

## 2021-11-24 DIAGNOSIS — N2589 Other disorders resulting from impaired renal tubular function: Secondary | ICD-10-CM | POA: Diagnosis not present

## 2021-11-24 DIAGNOSIS — D509 Iron deficiency anemia, unspecified: Secondary | ICD-10-CM | POA: Diagnosis not present

## 2021-11-25 DIAGNOSIS — R82998 Other abnormal findings in urine: Secondary | ICD-10-CM | POA: Diagnosis not present

## 2021-11-25 DIAGNOSIS — N186 End stage renal disease: Secondary | ICD-10-CM | POA: Diagnosis not present

## 2021-11-25 DIAGNOSIS — N2589 Other disorders resulting from impaired renal tubular function: Secondary | ICD-10-CM | POA: Diagnosis not present

## 2021-11-25 DIAGNOSIS — Z23 Encounter for immunization: Secondary | ICD-10-CM | POA: Diagnosis not present

## 2021-11-25 DIAGNOSIS — Z992 Dependence on renal dialysis: Secondary | ICD-10-CM | POA: Diagnosis not present

## 2021-11-25 DIAGNOSIS — N2581 Secondary hyperparathyroidism of renal origin: Secondary | ICD-10-CM | POA: Diagnosis not present

## 2021-11-25 DIAGNOSIS — E1129 Type 2 diabetes mellitus with other diabetic kidney complication: Secondary | ICD-10-CM | POA: Diagnosis not present

## 2021-11-25 DIAGNOSIS — D631 Anemia in chronic kidney disease: Secondary | ICD-10-CM | POA: Diagnosis not present

## 2021-11-25 DIAGNOSIS — D509 Iron deficiency anemia, unspecified: Secondary | ICD-10-CM | POA: Diagnosis not present

## 2021-11-26 DIAGNOSIS — Z992 Dependence on renal dialysis: Secondary | ICD-10-CM | POA: Diagnosis not present

## 2021-11-26 DIAGNOSIS — N2581 Secondary hyperparathyroidism of renal origin: Secondary | ICD-10-CM | POA: Diagnosis not present

## 2021-11-26 DIAGNOSIS — D631 Anemia in chronic kidney disease: Secondary | ICD-10-CM | POA: Diagnosis not present

## 2021-11-26 DIAGNOSIS — R82998 Other abnormal findings in urine: Secondary | ICD-10-CM | POA: Diagnosis not present

## 2021-11-26 DIAGNOSIS — E1129 Type 2 diabetes mellitus with other diabetic kidney complication: Secondary | ICD-10-CM | POA: Diagnosis not present

## 2021-11-26 DIAGNOSIS — D509 Iron deficiency anemia, unspecified: Secondary | ICD-10-CM | POA: Diagnosis not present

## 2021-11-26 DIAGNOSIS — N186 End stage renal disease: Secondary | ICD-10-CM | POA: Diagnosis not present

## 2021-11-26 DIAGNOSIS — N2589 Other disorders resulting from impaired renal tubular function: Secondary | ICD-10-CM | POA: Diagnosis not present

## 2021-11-26 DIAGNOSIS — Z23 Encounter for immunization: Secondary | ICD-10-CM | POA: Diagnosis not present

## 2021-11-27 DIAGNOSIS — Z992 Dependence on renal dialysis: Secondary | ICD-10-CM | POA: Diagnosis not present

## 2021-11-27 DIAGNOSIS — N186 End stage renal disease: Secondary | ICD-10-CM | POA: Diagnosis not present

## 2021-11-27 DIAGNOSIS — R82998 Other abnormal findings in urine: Secondary | ICD-10-CM | POA: Diagnosis not present

## 2021-11-27 DIAGNOSIS — E1129 Type 2 diabetes mellitus with other diabetic kidney complication: Secondary | ICD-10-CM | POA: Diagnosis not present

## 2021-11-27 DIAGNOSIS — D631 Anemia in chronic kidney disease: Secondary | ICD-10-CM | POA: Diagnosis not present

## 2021-11-27 DIAGNOSIS — D509 Iron deficiency anemia, unspecified: Secondary | ICD-10-CM | POA: Diagnosis not present

## 2021-11-27 DIAGNOSIS — N2589 Other disorders resulting from impaired renal tubular function: Secondary | ICD-10-CM | POA: Diagnosis not present

## 2021-11-27 DIAGNOSIS — N2581 Secondary hyperparathyroidism of renal origin: Secondary | ICD-10-CM | POA: Diagnosis not present

## 2021-11-27 DIAGNOSIS — Z23 Encounter for immunization: Secondary | ICD-10-CM | POA: Diagnosis not present

## 2021-11-28 DIAGNOSIS — Z23 Encounter for immunization: Secondary | ICD-10-CM | POA: Diagnosis not present

## 2021-11-28 DIAGNOSIS — N2581 Secondary hyperparathyroidism of renal origin: Secondary | ICD-10-CM | POA: Diagnosis not present

## 2021-11-28 DIAGNOSIS — R82998 Other abnormal findings in urine: Secondary | ICD-10-CM | POA: Diagnosis not present

## 2021-11-28 DIAGNOSIS — Z992 Dependence on renal dialysis: Secondary | ICD-10-CM | POA: Diagnosis not present

## 2021-11-28 DIAGNOSIS — E1129 Type 2 diabetes mellitus with other diabetic kidney complication: Secondary | ICD-10-CM | POA: Diagnosis not present

## 2021-11-28 DIAGNOSIS — N186 End stage renal disease: Secondary | ICD-10-CM | POA: Diagnosis not present

## 2021-11-28 DIAGNOSIS — N2589 Other disorders resulting from impaired renal tubular function: Secondary | ICD-10-CM | POA: Diagnosis not present

## 2021-11-28 DIAGNOSIS — D509 Iron deficiency anemia, unspecified: Secondary | ICD-10-CM | POA: Diagnosis not present

## 2021-11-28 DIAGNOSIS — D631 Anemia in chronic kidney disease: Secondary | ICD-10-CM | POA: Diagnosis not present

## 2021-11-29 DIAGNOSIS — N2581 Secondary hyperparathyroidism of renal origin: Secondary | ICD-10-CM | POA: Diagnosis not present

## 2021-11-29 DIAGNOSIS — N2589 Other disorders resulting from impaired renal tubular function: Secondary | ICD-10-CM | POA: Diagnosis not present

## 2021-11-29 DIAGNOSIS — D509 Iron deficiency anemia, unspecified: Secondary | ICD-10-CM | POA: Diagnosis not present

## 2021-11-29 DIAGNOSIS — Z23 Encounter for immunization: Secondary | ICD-10-CM | POA: Diagnosis not present

## 2021-11-29 DIAGNOSIS — D631 Anemia in chronic kidney disease: Secondary | ICD-10-CM | POA: Diagnosis not present

## 2021-11-29 DIAGNOSIS — R82998 Other abnormal findings in urine: Secondary | ICD-10-CM | POA: Diagnosis not present

## 2021-11-29 DIAGNOSIS — N186 End stage renal disease: Secondary | ICD-10-CM | POA: Diagnosis not present

## 2021-11-29 DIAGNOSIS — Z992 Dependence on renal dialysis: Secondary | ICD-10-CM | POA: Diagnosis not present

## 2021-11-29 DIAGNOSIS — E1129 Type 2 diabetes mellitus with other diabetic kidney complication: Secondary | ICD-10-CM | POA: Diagnosis not present

## 2021-11-30 DIAGNOSIS — D631 Anemia in chronic kidney disease: Secondary | ICD-10-CM | POA: Diagnosis not present

## 2021-11-30 DIAGNOSIS — E1129 Type 2 diabetes mellitus with other diabetic kidney complication: Secondary | ICD-10-CM | POA: Diagnosis not present

## 2021-11-30 DIAGNOSIS — Z992 Dependence on renal dialysis: Secondary | ICD-10-CM | POA: Diagnosis not present

## 2021-11-30 DIAGNOSIS — N2581 Secondary hyperparathyroidism of renal origin: Secondary | ICD-10-CM | POA: Diagnosis not present

## 2021-11-30 DIAGNOSIS — R82998 Other abnormal findings in urine: Secondary | ICD-10-CM | POA: Diagnosis not present

## 2021-11-30 DIAGNOSIS — N2589 Other disorders resulting from impaired renal tubular function: Secondary | ICD-10-CM | POA: Diagnosis not present

## 2021-11-30 DIAGNOSIS — N186 End stage renal disease: Secondary | ICD-10-CM | POA: Diagnosis not present

## 2021-11-30 DIAGNOSIS — D509 Iron deficiency anemia, unspecified: Secondary | ICD-10-CM | POA: Diagnosis not present

## 2021-11-30 DIAGNOSIS — Z23 Encounter for immunization: Secondary | ICD-10-CM | POA: Diagnosis not present

## 2021-12-01 DIAGNOSIS — D509 Iron deficiency anemia, unspecified: Secondary | ICD-10-CM | POA: Diagnosis not present

## 2021-12-01 DIAGNOSIS — E1129 Type 2 diabetes mellitus with other diabetic kidney complication: Secondary | ICD-10-CM | POA: Diagnosis not present

## 2021-12-01 DIAGNOSIS — R82998 Other abnormal findings in urine: Secondary | ICD-10-CM | POA: Diagnosis not present

## 2021-12-01 DIAGNOSIS — Z992 Dependence on renal dialysis: Secondary | ICD-10-CM | POA: Diagnosis not present

## 2021-12-01 DIAGNOSIS — Z23 Encounter for immunization: Secondary | ICD-10-CM | POA: Diagnosis not present

## 2021-12-01 DIAGNOSIS — D631 Anemia in chronic kidney disease: Secondary | ICD-10-CM | POA: Diagnosis not present

## 2021-12-01 DIAGNOSIS — N186 End stage renal disease: Secondary | ICD-10-CM | POA: Diagnosis not present

## 2021-12-01 DIAGNOSIS — N2581 Secondary hyperparathyroidism of renal origin: Secondary | ICD-10-CM | POA: Diagnosis not present

## 2021-12-01 DIAGNOSIS — N2589 Other disorders resulting from impaired renal tubular function: Secondary | ICD-10-CM | POA: Diagnosis not present

## 2021-12-02 DIAGNOSIS — D631 Anemia in chronic kidney disease: Secondary | ICD-10-CM | POA: Diagnosis not present

## 2021-12-02 DIAGNOSIS — E875 Hyperkalemia: Secondary | ICD-10-CM | POA: Diagnosis not present

## 2021-12-02 DIAGNOSIS — N2581 Secondary hyperparathyroidism of renal origin: Secondary | ICD-10-CM | POA: Diagnosis not present

## 2021-12-02 DIAGNOSIS — Z992 Dependence on renal dialysis: Secondary | ICD-10-CM | POA: Diagnosis not present

## 2021-12-02 DIAGNOSIS — R82998 Other abnormal findings in urine: Secondary | ICD-10-CM | POA: Diagnosis not present

## 2021-12-02 DIAGNOSIS — Z79899 Other long term (current) drug therapy: Secondary | ICD-10-CM | POA: Diagnosis not present

## 2021-12-02 DIAGNOSIS — N186 End stage renal disease: Secondary | ICD-10-CM | POA: Diagnosis not present

## 2021-12-02 DIAGNOSIS — D509 Iron deficiency anemia, unspecified: Secondary | ICD-10-CM | POA: Diagnosis not present

## 2021-12-03 DIAGNOSIS — N186 End stage renal disease: Secondary | ICD-10-CM | POA: Diagnosis not present

## 2021-12-03 DIAGNOSIS — Z79899 Other long term (current) drug therapy: Secondary | ICD-10-CM | POA: Diagnosis not present

## 2021-12-03 DIAGNOSIS — E875 Hyperkalemia: Secondary | ICD-10-CM | POA: Diagnosis not present

## 2021-12-03 DIAGNOSIS — D631 Anemia in chronic kidney disease: Secondary | ICD-10-CM | POA: Diagnosis not present

## 2021-12-03 DIAGNOSIS — R82998 Other abnormal findings in urine: Secondary | ICD-10-CM | POA: Diagnosis not present

## 2021-12-03 DIAGNOSIS — D509 Iron deficiency anemia, unspecified: Secondary | ICD-10-CM | POA: Diagnosis not present

## 2021-12-03 DIAGNOSIS — N2581 Secondary hyperparathyroidism of renal origin: Secondary | ICD-10-CM | POA: Diagnosis not present

## 2021-12-03 DIAGNOSIS — Z992 Dependence on renal dialysis: Secondary | ICD-10-CM | POA: Diagnosis not present

## 2021-12-04 DIAGNOSIS — D631 Anemia in chronic kidney disease: Secondary | ICD-10-CM | POA: Diagnosis not present

## 2021-12-04 DIAGNOSIS — Z992 Dependence on renal dialysis: Secondary | ICD-10-CM | POA: Diagnosis not present

## 2021-12-04 DIAGNOSIS — D509 Iron deficiency anemia, unspecified: Secondary | ICD-10-CM | POA: Diagnosis not present

## 2021-12-04 DIAGNOSIS — R82998 Other abnormal findings in urine: Secondary | ICD-10-CM | POA: Diagnosis not present

## 2021-12-04 DIAGNOSIS — N186 End stage renal disease: Secondary | ICD-10-CM | POA: Diagnosis not present

## 2021-12-04 DIAGNOSIS — Z79899 Other long term (current) drug therapy: Secondary | ICD-10-CM | POA: Diagnosis not present

## 2021-12-04 DIAGNOSIS — E875 Hyperkalemia: Secondary | ICD-10-CM | POA: Diagnosis not present

## 2021-12-04 DIAGNOSIS — N2581 Secondary hyperparathyroidism of renal origin: Secondary | ICD-10-CM | POA: Diagnosis not present

## 2021-12-05 DIAGNOSIS — D509 Iron deficiency anemia, unspecified: Secondary | ICD-10-CM | POA: Diagnosis not present

## 2021-12-05 DIAGNOSIS — E875 Hyperkalemia: Secondary | ICD-10-CM | POA: Diagnosis not present

## 2021-12-05 DIAGNOSIS — N2581 Secondary hyperparathyroidism of renal origin: Secondary | ICD-10-CM | POA: Diagnosis not present

## 2021-12-05 DIAGNOSIS — D631 Anemia in chronic kidney disease: Secondary | ICD-10-CM | POA: Diagnosis not present

## 2021-12-05 DIAGNOSIS — G4733 Obstructive sleep apnea (adult) (pediatric): Secondary | ICD-10-CM | POA: Diagnosis not present

## 2021-12-05 DIAGNOSIS — Z992 Dependence on renal dialysis: Secondary | ICD-10-CM | POA: Diagnosis not present

## 2021-12-05 DIAGNOSIS — Z79899 Other long term (current) drug therapy: Secondary | ICD-10-CM | POA: Diagnosis not present

## 2021-12-05 DIAGNOSIS — N186 End stage renal disease: Secondary | ICD-10-CM | POA: Diagnosis not present

## 2021-12-05 DIAGNOSIS — R82998 Other abnormal findings in urine: Secondary | ICD-10-CM | POA: Diagnosis not present

## 2021-12-06 ENCOUNTER — Telehealth: Payer: Self-pay | Admitting: Neurology

## 2021-12-06 DIAGNOSIS — D509 Iron deficiency anemia, unspecified: Secondary | ICD-10-CM | POA: Diagnosis not present

## 2021-12-06 DIAGNOSIS — Z79899 Other long term (current) drug therapy: Secondary | ICD-10-CM | POA: Diagnosis not present

## 2021-12-06 DIAGNOSIS — Z992 Dependence on renal dialysis: Secondary | ICD-10-CM | POA: Diagnosis not present

## 2021-12-06 DIAGNOSIS — R82998 Other abnormal findings in urine: Secondary | ICD-10-CM | POA: Diagnosis not present

## 2021-12-06 DIAGNOSIS — D631 Anemia in chronic kidney disease: Secondary | ICD-10-CM | POA: Diagnosis not present

## 2021-12-06 DIAGNOSIS — N2581 Secondary hyperparathyroidism of renal origin: Secondary | ICD-10-CM | POA: Diagnosis not present

## 2021-12-06 DIAGNOSIS — E875 Hyperkalemia: Secondary | ICD-10-CM | POA: Diagnosis not present

## 2021-12-06 DIAGNOSIS — N186 End stage renal disease: Secondary | ICD-10-CM | POA: Diagnosis not present

## 2021-12-06 NOTE — Telephone Encounter (Signed)
Pt was scheduled for her Initial CPAP visit on 02/20/2022, pt was informed to bring machine and power cord.  DME: [facility]: [phone] Option 1 F: [id number] Equipment Issued: Melton Krebs on 12/05/2021  Schedule between 01/05/2022-03/05/2022

## 2021-12-07 DIAGNOSIS — Z79899 Other long term (current) drug therapy: Secondary | ICD-10-CM | POA: Diagnosis not present

## 2021-12-07 DIAGNOSIS — N2581 Secondary hyperparathyroidism of renal origin: Secondary | ICD-10-CM | POA: Diagnosis not present

## 2021-12-07 DIAGNOSIS — D631 Anemia in chronic kidney disease: Secondary | ICD-10-CM | POA: Diagnosis not present

## 2021-12-07 DIAGNOSIS — R82998 Other abnormal findings in urine: Secondary | ICD-10-CM | POA: Diagnosis not present

## 2021-12-07 DIAGNOSIS — N186 End stage renal disease: Secondary | ICD-10-CM | POA: Diagnosis not present

## 2021-12-07 DIAGNOSIS — D509 Iron deficiency anemia, unspecified: Secondary | ICD-10-CM | POA: Diagnosis not present

## 2021-12-07 DIAGNOSIS — E875 Hyperkalemia: Secondary | ICD-10-CM | POA: Diagnosis not present

## 2021-12-07 DIAGNOSIS — Z992 Dependence on renal dialysis: Secondary | ICD-10-CM | POA: Diagnosis not present

## 2021-12-08 DIAGNOSIS — E875 Hyperkalemia: Secondary | ICD-10-CM | POA: Diagnosis not present

## 2021-12-08 DIAGNOSIS — Z992 Dependence on renal dialysis: Secondary | ICD-10-CM | POA: Diagnosis not present

## 2021-12-08 DIAGNOSIS — D631 Anemia in chronic kidney disease: Secondary | ICD-10-CM | POA: Diagnosis not present

## 2021-12-08 DIAGNOSIS — R82998 Other abnormal findings in urine: Secondary | ICD-10-CM | POA: Diagnosis not present

## 2021-12-08 DIAGNOSIS — D509 Iron deficiency anemia, unspecified: Secondary | ICD-10-CM | POA: Diagnosis not present

## 2021-12-08 DIAGNOSIS — Z79899 Other long term (current) drug therapy: Secondary | ICD-10-CM | POA: Diagnosis not present

## 2021-12-08 DIAGNOSIS — N2581 Secondary hyperparathyroidism of renal origin: Secondary | ICD-10-CM | POA: Diagnosis not present

## 2021-12-08 DIAGNOSIS — N186 End stage renal disease: Secondary | ICD-10-CM | POA: Diagnosis not present

## 2021-12-09 DIAGNOSIS — E875 Hyperkalemia: Secondary | ICD-10-CM | POA: Diagnosis not present

## 2021-12-09 DIAGNOSIS — D631 Anemia in chronic kidney disease: Secondary | ICD-10-CM | POA: Diagnosis not present

## 2021-12-09 DIAGNOSIS — Z79899 Other long term (current) drug therapy: Secondary | ICD-10-CM | POA: Diagnosis not present

## 2021-12-09 DIAGNOSIS — R82998 Other abnormal findings in urine: Secondary | ICD-10-CM | POA: Diagnosis not present

## 2021-12-09 DIAGNOSIS — Z992 Dependence on renal dialysis: Secondary | ICD-10-CM | POA: Diagnosis not present

## 2021-12-09 DIAGNOSIS — N2581 Secondary hyperparathyroidism of renal origin: Secondary | ICD-10-CM | POA: Diagnosis not present

## 2021-12-09 DIAGNOSIS — D509 Iron deficiency anemia, unspecified: Secondary | ICD-10-CM | POA: Diagnosis not present

## 2021-12-09 DIAGNOSIS — N186 End stage renal disease: Secondary | ICD-10-CM | POA: Diagnosis not present

## 2021-12-10 ENCOUNTER — Other Ambulatory Visit: Payer: Self-pay

## 2021-12-10 ENCOUNTER — Ambulatory Visit (HOSPITAL_COMMUNITY)
Admission: RE | Admit: 2021-12-10 | Discharge: 2021-12-10 | Disposition: A | Discharge status: Home / Self Care | Payer: Medicare Other | Source: Ambulatory Visit | Attending: Neurology | Admitting: Neurology

## 2021-12-10 DIAGNOSIS — I120 Hypertensive chronic kidney disease with stage 5 chronic kidney disease or end stage renal disease: Secondary | ICD-10-CM | POA: Insufficient documentation

## 2021-12-10 DIAGNOSIS — G4730 Sleep apnea, unspecified: Secondary | ICD-10-CM | POA: Diagnosis not present

## 2021-12-10 DIAGNOSIS — I10 Essential (primary) hypertension: Secondary | ICD-10-CM | POA: Diagnosis not present

## 2021-12-10 DIAGNOSIS — I639 Cerebral infarction, unspecified: Secondary | ICD-10-CM | POA: Insufficient documentation

## 2021-12-10 DIAGNOSIS — N2581 Secondary hyperparathyroidism of renal origin: Secondary | ICD-10-CM | POA: Diagnosis not present

## 2021-12-10 DIAGNOSIS — E1122 Type 2 diabetes mellitus with diabetic chronic kidney disease: Secondary | ICD-10-CM | POA: Insufficient documentation

## 2021-12-10 DIAGNOSIS — D509 Iron deficiency anemia, unspecified: Secondary | ICD-10-CM | POA: Diagnosis not present

## 2021-12-10 DIAGNOSIS — I38 Endocarditis, valve unspecified: Secondary | ICD-10-CM | POA: Insufficient documentation

## 2021-12-10 DIAGNOSIS — N186 End stage renal disease: Secondary | ICD-10-CM | POA: Diagnosis not present

## 2021-12-10 DIAGNOSIS — Z992 Dependence on renal dialysis: Secondary | ICD-10-CM | POA: Diagnosis not present

## 2021-12-10 DIAGNOSIS — E785 Hyperlipidemia, unspecified: Secondary | ICD-10-CM | POA: Diagnosis not present

## 2021-12-10 DIAGNOSIS — R82998 Other abnormal findings in urine: Secondary | ICD-10-CM | POA: Diagnosis not present

## 2021-12-10 DIAGNOSIS — E875 Hyperkalemia: Secondary | ICD-10-CM | POA: Diagnosis not present

## 2021-12-10 DIAGNOSIS — D631 Anemia in chronic kidney disease: Secondary | ICD-10-CM | POA: Diagnosis not present

## 2021-12-10 DIAGNOSIS — Z79899 Other long term (current) drug therapy: Secondary | ICD-10-CM | POA: Diagnosis not present

## 2021-12-10 LAB — ECHOCARDIOGRAM COMPLETE
Area-P 1/2: 2.34 cm2
S' Lateral: 2.7 cm

## 2021-12-10 NOTE — Progress Notes (Signed)
°  Echocardiogram 2D Echocardiogram has been performed.  Felicia Thompson 12/10/2021, 3:58 PM

## 2021-12-11 DIAGNOSIS — R82998 Other abnormal findings in urine: Secondary | ICD-10-CM | POA: Diagnosis not present

## 2021-12-11 DIAGNOSIS — E875 Hyperkalemia: Secondary | ICD-10-CM | POA: Diagnosis not present

## 2021-12-11 DIAGNOSIS — Z992 Dependence on renal dialysis: Secondary | ICD-10-CM | POA: Diagnosis not present

## 2021-12-11 DIAGNOSIS — Z79899 Other long term (current) drug therapy: Secondary | ICD-10-CM | POA: Diagnosis not present

## 2021-12-11 DIAGNOSIS — D509 Iron deficiency anemia, unspecified: Secondary | ICD-10-CM | POA: Diagnosis not present

## 2021-12-11 DIAGNOSIS — N186 End stage renal disease: Secondary | ICD-10-CM | POA: Diagnosis not present

## 2021-12-11 DIAGNOSIS — D631 Anemia in chronic kidney disease: Secondary | ICD-10-CM | POA: Diagnosis not present

## 2021-12-11 DIAGNOSIS — N2581 Secondary hyperparathyroidism of renal origin: Secondary | ICD-10-CM | POA: Diagnosis not present

## 2021-12-12 ENCOUNTER — Telehealth (INDEPENDENT_AMBULATORY_CARE_PROVIDER_SITE_OTHER): Payer: Medicare Other | Admitting: Neurology

## 2021-12-12 DIAGNOSIS — E875 Hyperkalemia: Secondary | ICD-10-CM | POA: Diagnosis not present

## 2021-12-12 DIAGNOSIS — I639 Cerebral infarction, unspecified: Secondary | ICD-10-CM

## 2021-12-12 DIAGNOSIS — I951 Orthostatic hypotension: Secondary | ICD-10-CM | POA: Diagnosis not present

## 2021-12-12 DIAGNOSIS — Z992 Dependence on renal dialysis: Secondary | ICD-10-CM | POA: Diagnosis not present

## 2021-12-12 DIAGNOSIS — R82998 Other abnormal findings in urine: Secondary | ICD-10-CM | POA: Diagnosis not present

## 2021-12-12 DIAGNOSIS — N186 End stage renal disease: Secondary | ICD-10-CM | POA: Diagnosis not present

## 2021-12-12 DIAGNOSIS — D509 Iron deficiency anemia, unspecified: Secondary | ICD-10-CM | POA: Diagnosis not present

## 2021-12-12 DIAGNOSIS — D631 Anemia in chronic kidney disease: Secondary | ICD-10-CM | POA: Diagnosis not present

## 2021-12-12 DIAGNOSIS — Z79899 Other long term (current) drug therapy: Secondary | ICD-10-CM | POA: Diagnosis not present

## 2021-12-12 DIAGNOSIS — N2581 Secondary hyperparathyroidism of renal origin: Secondary | ICD-10-CM | POA: Diagnosis not present

## 2021-12-12 NOTE — Progress Notes (Deleted)
[facility]  PATIENT: Felicia Thompson DOB: [birth year]  REFERRING CLINICIAN: Isaac Bliss, [place]* HISTORY FROM: Patient and daughter  REASON FOR VISIT: Episode of freezing of gait, falls.    HISTORICAL  CHIEF COMPLAINT:  No chief complaint on file.   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  Per daughter, patient symptoms have been going on for the past 8 months but worse in the last couple months where she is having almost daily episode.  Usually episodes start with patient feeling lightheaded then described feeling funny, all of a sudden she will freeze and then will get limp and and fall if no one assist her.  Episodes usually last 30 seconds followed by confusion and on 1 occasion she had bowel incontinence.  Daughter said during the episode she is awake but she is not unresponsive and does not follow any directions.  With this recurrent fall she had hit her head and had injured her hips. She denies any tonic clonic activity, denies any previous seizures, denies any history of stroke and no seizure risk factors. She is not on ASM.    OTHER MEDICAL CONDITIONS: HTN, HLD, DMII, CKD,    REVIEW OF SYSTEMS: Full 14 system review of systems performed and negative with exception of: as noted in the HPI.   ALLERGIES: Allergies  Allergen Reactions   Tape Other (See Comments)    Blisters   Tetracyclines & Related Rash    HOME MEDICATIONS: Outpatient Medications Prior to Visit  Medication Sig Dispense Refill   aspirin 81 MG chewable tablet Chew 81 mg by mouth daily.     atorvastatin (LIPITOR) 80 MG tablet TAKE ONE TABLET BY MOUTH DAILY 90 tablet 1   carvedilol (COREG) 25 MG tablet Take 25 mg by mouth 2 (two) times daily with a meal.      Continuous Blood Gluc Sensor (FREESTYLE LIBRE 2 SENSOR) MISC 1 Device by Does not apply route every 14 (fourteen) days. 6 each 3   glipiZIDE (GLUCOTROL) 5 MG tablet Take 1 tablet (5 mg total) by mouth daily before breakfast. 60 tablet 1   insulin NPH-regular Human (HUMULIN 70/30) (70-30) 100 UNIT/ML injection Inject 10 Units into the skin daily with breakfast. 30 mL 11   losartan (COZAAR) 50 MG tablet Take 1 tablet (50 mg total) by mouth daily. 30 tablet 3   Multiple Vitamins-Minerals (RENAPLEX) TABS Take 1 tablet by mouth daily.     Probiotic Product (CVS ADV PROBIOTIC GUMMIES PO) Take 1 tablet by mouth at bedtime.     RENVELA 800 MG tablet Take 1,600 mg by mouth 3 (three) times daily with meals.     venlafaxine XR (EFFEXOR-XR) 150 MG 24 hr capsule Take 150 mg by mouth daily with breakfast.     vitamin B-12 (CYANOCOBALAMIN) 1000 MCG tablet Take 1,000 mcg by mouth every other day.     No facility-administered medications prior to visit.    PAST MEDICAL HISTORY: Past Medical History:  Diagnosis Date   Arthritis    Blood in stool    when appendix burst   Chronic kidney disease    Depression    DM (diabetes mellitus), type 2 with complications ([place])    ESRD on peritoneal dialysis ([place])    History of fainting spells of unknown cause  Hyperlipidemia    Hyperlipidemia associated with type 2 diabetes mellitus (HCC)    Hypertension    OSA (obstructive sleep apnea)     PAST SURGICAL HISTORY: Past Surgical History:  Procedure Laterality Date   ABDOMINAL HYSTERECTOMY     APPENDECTOMY     CHOLECYSTECTOMY     TONSILECTOMY, ADENOIDECTOMY, BILATERAL MYRINGOTOMY AND TUBES      FAMILY HISTORY: Family History  Problem Relation Age of Onset   Hearing loss Father    Hyperlipidemia Father    Heart disease Father    Early death Father    Cancer Father    Early death Brother    Drug abuse Brother    Diabetes Brother    Depression Brother    Alcohol abuse Brother    Miscarriages /  [place] Daughter    Arthritis Daughter    Depression Daughter    Early death Maternal Grandmother    Heart attack Maternal Grandmother    Heart disease Maternal Grandmother    Hearing loss Paternal Grandmother    Heart disease Paternal Grandmother    Heart disease Paternal Grandfather    Early death Paternal Grandfather    Heart attack Paternal Grandfather     SOCIAL HISTORY: Social History   Socioeconomic History   Marital status: Widowed    Spouse name: Not on file   Number of children: 4   Years of education: Not on file   Highest education level: Master's degree (e.g., MA, MS, MEng, MEd, MSW, MBA)  Occupational History   Not on file  Tobacco Use   Smoking status: Never   Smokeless tobacco: Never  Substance and Sexual Activity   Alcohol use: Never   Drug use: Never   Sexual activity: Not Currently  Other Topics Concern   Not on file  Social History Narrative   Lives with daughter and son in law and 2 children   Right handed    Caffeine: 3 cup a week   Social Determinants of Health   Financial Resource Strain: Low Risk    Difficulty of Paying Living Expenses: Not hard at all  Food Insecurity: No Food Insecurity   Worried About Charity fundraiser in the Last Year: Never true   [place] in the Last Year: Never true  Transportation Needs: No Transportation Needs   Lack of Transportation (Medical): No   Lack of Transportation (Non-Medical): No  Physical Activity: Unknown   Days of Exercise per Week: 0 days   Minutes of Exercise per Session: Not on file  Stress: No Stress Concern Present   Feeling of Stress : Not at all  Social Connections: Moderately Isolated   Frequency of Communication with Friends and Family: More than three times a week   Frequency of Social Gatherings with Friends and Family: Never   Attends Religious Services: 1 to 4 times per year   Active Member of [employer] or Organizations: No   Attends Archivist Meetings: Not on  file   Marital Status: Widowed  Intimate Partner Violence: Not on file     PHYSICAL EXAM  GENERAL EXAM/CONSTITUTIONAL: Vitals:  There were no vitals filed for this visit.  There is no height or weight on file to calculate BMI. Wt Readings from Last 3 Encounters:  10/15/21 186 lb 6.4 oz (84.6 kg)  10/12/21 198 lb 13.7 oz (90.2 kg)  10/05/21 182 lb 3.2 oz (82.6 kg)   Patient is in no distress; well developed, nourished and groomed; neck  is supple  EYES: Pupils round and reactive to light, Visual fields full to confrontation, Extraocular movements intacts,   MUSCULOSKELETAL: Gait, strength, tone, movements noted in Neurologic exam below  NEUROLOGIC: MENTAL STATUS:  No flowsheet data found. awake, alert, oriented to person, place and time recent and remote memory intact normal attention and concentration language fluent, comprehension intact, naming intact fund of knowledge appropriate  CRANIAL NERVE:  2nd, 3rd, 4th, 6th - pupils equal and reactive to light, visual fields full to confrontation, extraocular muscles intact, no nystagmus 5th - facial sensation symmetric 7th - facial strength symmetric 8th - hearing intact 9th - palate elevates symmetrically, uvula midline 11th - shoulder shrug symmetric 12th - tongue protrusion midline  MOTOR:  normal bulk and tone, full strength in the BUE, BLE  SENSORY:  normal and symmetric to light touch, pinprick, temperature, vibration  COORDINATION:  finger-nose-finger, fine finger movements normal  REFLEXES:  deep tendon reflexes present and symmetric     DIAGNOSTIC DATA (LABS, IMAGING, TESTING) - I reviewed patient records, labs, notes, testing and imaging myself where available.  Lab Results  Component Value Date   WBC 7.5 10/11/2021   HGB 9.2 (L) 10/11/2021   HCT 26.0 (L) 10/11/2021   MCV 90.0 10/11/2021   PLT 208 10/11/2021      Component Value Date/Time   NA 134 (L) 10/11/2021 1016   K 4.6 10/11/2021 1016    CL 98 10/11/2021 1016   CO2 23 10/11/2021 1016   GLUCOSE 198 (H) 10/11/2021 1016   BUN 82 (H) 10/11/2021 1016   CREATININE 8.00 (H) 10/11/2021 1016   CALCIUM 7.7 (L) 10/11/2021 1016   PROT 5.0 (L) 10/11/2021 1016   ALBUMIN 2.6 (L) 10/11/2021 1016   AST 19 10/11/2021 1016   ALT 23 10/11/2021 1016   ALKPHOS 45 10/11/2021 1016   BILITOT 0.5 10/11/2021 1016   GFRNONAA 5 (L) 10/11/2021 1016   Lab Results  Component Value Date   CHOL 182 09/27/2021   HDL 33 (L) 09/27/2021   LDLCALC 94 09/27/2021   LDLDIRECT 77.0 05/15/2021   TRIG 330 (H) 09/27/2021   CHOLHDL 5.5 (H) 09/27/2021   Lab Results  Component Value Date   HGBA1C 7.4 (A) 10/15/2021   Lab Results  Component Value Date   VITAMINB12 267 05/15/2021   Lab Results  Component Value Date   TSH 2.16 10/05/2021     Head CT 09/03/2021 1. No acute intracranial pathology. 2. Mild chronic white matter microangiopathy.  ASSESSMENT AND PLAN  76 y.o. year old female with CKD 4, hypertension, hyperlipidemia, diabetes mellitus who is presenting with seizure-like activity described as feeling weird, lightheadedness, freezing of gait, increase tone, then body going limp and fall.  There was 1 episode associated with bowel incontinence.  It is unclear if these episodes are actual seizures versus catatonia even though there last less than 30 seconds versus period of hypotension.  I will order a routine EEG and MRI brain without contrast.  I will also contact Dr. Hortense Ramal for an expedited EMU admission for capture and characterization of the events.  Follow-up in 3 months.   No diagnosis found.   PLAN: MRI Brain without contrast Routine EEG  EMU admission for capture and characterization Return in 3 months for follow up.   No orders of the defined types were placed in this encounter.   No orders of the defined types were placed in this encounter.    No follow-ups on file.    Alric Ran,  MD 12/12/2021, 5:40 PM  [facility] [address], [place], [place] [address] [phone]

## 2021-12-13 ENCOUNTER — Encounter: Payer: Self-pay | Admitting: Family Medicine

## 2021-12-13 ENCOUNTER — Encounter: Payer: Self-pay | Admitting: Neurology

## 2021-12-13 ENCOUNTER — Telehealth (INDEPENDENT_AMBULATORY_CARE_PROVIDER_SITE_OTHER): Payer: Medicare Other | Admitting: Family Medicine

## 2021-12-13 ENCOUNTER — Telehealth: Payer: Self-pay | Admitting: Internal Medicine

## 2021-12-13 VITALS — Temp 99.0°F

## 2021-12-13 DIAGNOSIS — R82998 Other abnormal findings in urine: Secondary | ICD-10-CM | POA: Diagnosis not present

## 2021-12-13 DIAGNOSIS — Z79899 Other long term (current) drug therapy: Secondary | ICD-10-CM | POA: Diagnosis not present

## 2021-12-13 DIAGNOSIS — E875 Hyperkalemia: Secondary | ICD-10-CM | POA: Diagnosis not present

## 2021-12-13 DIAGNOSIS — Z992 Dependence on renal dialysis: Secondary | ICD-10-CM | POA: Diagnosis not present

## 2021-12-13 DIAGNOSIS — D631 Anemia in chronic kidney disease: Secondary | ICD-10-CM | POA: Diagnosis not present

## 2021-12-13 DIAGNOSIS — N2581 Secondary hyperparathyroidism of renal origin: Secondary | ICD-10-CM | POA: Diagnosis not present

## 2021-12-13 DIAGNOSIS — R059 Cough, unspecified: Secondary | ICD-10-CM | POA: Diagnosis not present

## 2021-12-13 DIAGNOSIS — D509 Iron deficiency anemia, unspecified: Secondary | ICD-10-CM | POA: Diagnosis not present

## 2021-12-13 DIAGNOSIS — N186 End stage renal disease: Secondary | ICD-10-CM | POA: Diagnosis not present

## 2021-12-13 MED ORDER — BENZONATATE 100 MG PO CAPS: ORAL_CAPSULE | ORAL | 0 refills | Status: DC

## 2021-12-13 NOTE — Patient Instructions (Addendum)
°  HOME CARE TIPS:  -COVID19 testing information: [email or web address]  Most pharmacies also offer testing and home test kits. If the Covid19 test is positive and you desire antiviral treatment, please contact a [facility] or schedule a follow up virtual visit through your primary care office or through the [employer].  Other test to treat options: ConnectRV.is?[email or web address]  -I sent the medication(s) we discussed to your pharmacy: Meds ordered this encounter  Medications   benzonatate (TESSALON PERLES) 100 MG capsule    Sig: 1-2 capsules as needed cough    Dispense:  30 capsule    Refill:  0     -can use nasal saline a few times per day if you have nasal congestion  -warm salt water gargles twice daily if sore throat or tick in throat  - avoid dairy  -follow up with your doctor in 2-3 days unless improving and feeling better  -stay home while sick, except to seek medical care. If you have COVID19, you will likely be contagious for 7-10 days. Flu or Influenza is likely contagious for about 7 days. Other respiratory viral infections remain contagious for 5-10+ days depending on the virus and many other factors. Wear a good mask that fits snugly (such as N95 or KN95) if around others to reduce the risk of transmission.  It was nice to meet you today, and I really hope you are feeling better soon. I help [facility] out with telemedicine visits on Tuesdays and Thursdays and am happy to help if you need a follow up virtual visit on those days. Otherwise, if you have any concerns or questions following this visit please schedule a follow up visit with your Primary Care doctor or seek care at a local urgent care clinic to avoid delays in care.    Seek in person care or schedule a follow up video visit promptly if your symptoms worsen, new concerns arise or you are not improving with treatment. Call 911 and/or seek emergency care  if your symptoms are severe or life threatening.

## 2021-12-13 NOTE — Telephone Encounter (Signed)
Patient calling in with respiratory symptoms: Shortness of breath, chest pain, palpitations or other red words send to Triage  Does the patient have a fever over 100, cough, congestion, sore throat, runny nose, lost of taste/smell (please list symptoms that patient has)?cough  What date did symptoms start? Around 12-10-2021 (If over 5 days ago, pt may be scheduled for in person visit)  Have you tested for Covid in the last 5 days? No   If yes, was it positive []  OR negative [x] ? If positive in the last 5 days, please schedule virtual visit now. If negative, schedule for an in person OV with the next available provider if PCP has no openings. Please also let patient know they will be tested again (follow the script below)  "you will have to arrive 15mins prior to your appt time to be Covid tested. Please park in back of office at the cone & call [phone] to let the staff know you have arrived. A staff member will meet you at your car to do a rapid covid test. Once the test has resulted you will be notified by phone of your results to determine if appt will remain an in person visit or be converted to a virtual/phone visit. If you arrive less than 102mins before your appt time, your visit will be automatically converted to virtual & any recommended testing will happen AFTER the visit." Pt has virtual with dr Maudie Mercury 12-13-2021 at 5 pm   [place]  If no availability for virtual visit in office,  please schedule another [place] office  If no availability at another [place] office, please instruct patient that they can schedule an evisit or virtual visit through their mychart account. Visits up to 8pm  patients can be seen in office 5 days after positive COVID test

## 2021-12-13 NOTE — Patient Instructions (Signed)
Follow up with your PCP  Return if worse

## 2021-12-13 NOTE — Progress Notes (Signed)
° °  Virtual Visit via Video Note  I connected with Felicia Thompson on 12/13/21 at  2:45 PM EST by a video enabled telemedicine application and verified that I am speaking with the correct person using two identifiers.  Location: Patient: Home  Provider: Office at [facility] in [place]   I discussed the limitations of evaluation and management by telemedicine and the availability of in person appointments. The patient expressed understanding and agreed to proceed.  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  Per daughter, patient symptoms have been going on for the past 8 months but worse in the last couple months where she is having almost daily episode.  Usually episodes start with patient feeling lightheaded then described feeling funny, all of a sudden she will freeze and then will get limp and and fall if no one assist her.  Episodes usually last 30 seconds followed by confusion and on 1 occasion she had bowel incontinence.  Daughter said during the episode she is awake but she is not unresponsive and does not follow any directions.  With this recurrent fall she had hit her head and had injured her hips. She denies any tonic clonic activity, denies any previous seizures, denies any history of stroke and no seizure risk factors. She is not on ASM.   Interval history 12/13/2019:  Patient appointment was done today via virtual visit.  She states overall she is doing much better.  I have seen her first back in October, at that time she was complaining of episode of freezing gait with increased tone and fall.  I obtained a routine EEG which showed generalized slowing, patient continued to have these episodes therefore I sent her to the epilepsy monitoring unit  for capture and characterization.  In the hospital, the episodes was captured and was secondary to hypotension.  Patient was found to have orthostatic hypotension.  There were no seizures.  She was discharged with follow-up with PCP.  Since being discharged she also mentioned that these episodes have improved.  I have finished her stroke work-up.  All of her tests were within normal limits, CTA did not show any high-grade stenosis and a echocardiogram showed EF of 60 to 65%.  At this point I will recommend patient to continue current medication and follow-up with her primary care doctor return sooner if worse.   Observations/Objective:  Awake, alert oriented in no acute distress.  Answering question appropriately.  Speech is normal, no aphasia or dysarthria.  Face is symmetric.   Assessment and Plan: Orthostatic hypotension  Multiple punctate strokes  Follow Up Instructions: Follow up with your PCP  Continue current medications  Return if worse    I discussed the assessment and treatment plan with the patient. The patient was provided an opportunity to ask questions and all were answered. The patient agreed with the plan and demonstrated an understanding of the instructions.   The patient was advised to call back or seek an in-person evaluation if the symptoms worsen or if the condition fails to improve as anticipated.     Alric Ran, MD

## 2021-12-13 NOTE — Progress Notes (Signed)
Virtual Visit via Video Note  I connected with Felicia Thompson  on 12/13/21 at  5:00 PM EST by a video enabled telemedicine application and verified that I am speaking with the correct person using two identifiers.  Location patient: [place] Location provider:work or home office Persons participating in the virtual visit: patient, provider  I discussed the limitations and requested verbal permission for telemedicine visit. The patient expressed understanding and agreed to proceed.   HPI:  Acute telemedicine visit for cough: -Onset: 4 days -Symptoms include: runny nose, cough, low grade temp not over 99, ribs hurt from coughing -a covid test was negative yesterday -Denies: CP, SOB, NVD, fever -drinking fluids -multiple family members  -Pertinent past medical history: see below -Pertinent medication allergies: Allergies  Allergen Reactions   Tape Other (See Comments)    Blisters   Tetracyclines & Related Rash   -COVID-19 vaccine status:  Immunization History  Administered Date(s) Administered   Fluad Quad(high Dose 65+) 10/05/2021   Influenza, High Dose Seasonal PF 10/20/2020   PFIZER Comirnaty(Gray Top)Covid-19 Tri-Sucrose Vaccine 12/25/2019, 01/17/2020, 10/24/2020   Pneumococcal Conjugate-13 12/20/2018   Pneumococcal Polysaccharide-23 10/05/2021     ROS: See pertinent positives and negatives per HPI.  Past Medical History:  Diagnosis Date   Arthritis    Blood in stool    when appendix burst   Chronic kidney disease    Depression    DM (diabetes mellitus), type 2 with complications (HCC)    ESRD on peritoneal dialysis ([place])    History of fainting spells of unknown cause    Hyperlipidemia    Hyperlipidemia associated with type 2 diabetes mellitus (HCC)    Hypertension    OSA (obstructive sleep apnea)     Past Surgical History:  Procedure Laterality Date   ABDOMINAL HYSTERECTOMY     APPENDECTOMY     CHOLECYSTECTOMY     TONSILECTOMY, ADENOIDECTOMY, BILATERAL MYRINGOTOMY AND  TUBES       Current Outpatient Medications:    aspirin 81 MG chewable tablet, Chew 81 mg by mouth daily., Disp: , Rfl:    atorvastatin (LIPITOR) 80 MG tablet, TAKE ONE TABLET BY MOUTH DAILY, Disp: 90 tablet, Rfl: 1   benzonatate (TESSALON PERLES) 100 MG capsule, 1-2 capsules as needed cough, Disp: 30 capsule, Rfl: 0   carvedilol (COREG) 25 MG tablet, Take 25 mg by mouth 2 (two) times daily with a meal., Disp: , Rfl:    Continuous Blood Gluc Sensor (FREESTYLE LIBRE 2 SENSOR) MISC, 1 Device by Does not apply route every 14 (fourteen) days., Disp: 6 each, Rfl: 3   glipiZIDE (GLUCOTROL) 5 MG tablet, Take 1 tablet (5 mg total) by mouth daily before breakfast., Disp: 60 tablet, Rfl: 1   insulin NPH-regular Human (HUMULIN 70/30) (70-30) 100 UNIT/ML injection, Inject 10 Units into the skin daily with breakfast., Disp: 30 mL, Rfl: 11   losartan (COZAAR) 50 MG tablet, Take 1 tablet (50 mg total) by mouth daily., Disp: 30 tablet, Rfl: 3   Multiple Vitamins-Minerals (RENAPLEX) TABS, Take 1 tablet by mouth daily., Disp: , Rfl:    Probiotic Product (CVS ADV PROBIOTIC GUMMIES PO), Take 1 tablet by mouth at bedtime., Disp: , Rfl:    venlafaxine XR (EFFEXOR-XR) 150 MG 24 hr capsule, Take 150 mg by mouth daily with breakfast., Disp: , Rfl:   EXAM:  VITALS per patient if applicable:  GENERAL: alert, oriented, appears well and in no acute distress  HEENT: atraumatic, conjunttiva clear, no obvious abnormalities on inspection of external nose  and ears  NECK: normal movements of the head and neck  LUNGS: on inspection no signs of respiratory distress, breathing rate appears normal, no obvious gross SOB, gasping or wheezing  CV: no obvious cyanosis  MS: moves all visible extremities without noticeable abnormality  PSYCH/NEURO: pleasant and cooperative, no obvious depression or anxiety, speech and thought processing grossly intact  ASSESSMENT AND PLAN:  Discussed the following assessment and  plan:  Cough, unspecified type  -we discussed possible serious and likely etiologies, options for evaluation and workup, limitations of telemedicine visit vs in person visit, treatment, treatment risks and precautions. Pt is agreeable to treatment via telemedicine at this moment. Suspect VRUI vs other. Did advise repeat covid testing and advised can contact [place] pharmacy for antiviral if positive and in first 5 days of symptoms. Sent Rx for cough. Other symptomatic care measures per patient instructions.  Advised to seek prompt in person care if worsening, new symptoms arise, or if is not improving with treatment as expected per our conversation of expected course. Discussed options for follow up care. Did let this patient know that I do telemedicine on Tuesdays and Thursdays for [place] and those are the days I am logged into the system. Advised to schedule follow up visit with PCP, [facility] virtual visits or [facility] if any further questions or concerns to avoid delays in care.   I discussed the assessment and treatment plan with the patient. The patient was provided an opportunity to ask questions and all were answered. The patient agreed with the plan and demonstrated an understanding of the instructions.     Lucretia Kern, DO

## 2021-12-14 DIAGNOSIS — D509 Iron deficiency anemia, unspecified: Secondary | ICD-10-CM | POA: Diagnosis not present

## 2021-12-14 DIAGNOSIS — N2581 Secondary hyperparathyroidism of renal origin: Secondary | ICD-10-CM | POA: Diagnosis not present

## 2021-12-14 DIAGNOSIS — D631 Anemia in chronic kidney disease: Secondary | ICD-10-CM | POA: Diagnosis not present

## 2021-12-14 DIAGNOSIS — R82998 Other abnormal findings in urine: Secondary | ICD-10-CM | POA: Diagnosis not present

## 2021-12-14 DIAGNOSIS — E875 Hyperkalemia: Secondary | ICD-10-CM | POA: Diagnosis not present

## 2021-12-14 DIAGNOSIS — Z992 Dependence on renal dialysis: Secondary | ICD-10-CM | POA: Diagnosis not present

## 2021-12-14 DIAGNOSIS — Z79899 Other long term (current) drug therapy: Secondary | ICD-10-CM | POA: Diagnosis not present

## 2021-12-14 DIAGNOSIS — N186 End stage renal disease: Secondary | ICD-10-CM | POA: Diagnosis not present

## 2021-12-15 DIAGNOSIS — D631 Anemia in chronic kidney disease: Secondary | ICD-10-CM | POA: Diagnosis not present

## 2021-12-15 DIAGNOSIS — N2581 Secondary hyperparathyroidism of renal origin: Secondary | ICD-10-CM | POA: Diagnosis not present

## 2021-12-15 DIAGNOSIS — R82998 Other abnormal findings in urine: Secondary | ICD-10-CM | POA: Diagnosis not present

## 2021-12-15 DIAGNOSIS — Z992 Dependence on renal dialysis: Secondary | ICD-10-CM | POA: Diagnosis not present

## 2021-12-15 DIAGNOSIS — E875 Hyperkalemia: Secondary | ICD-10-CM | POA: Diagnosis not present

## 2021-12-15 DIAGNOSIS — D509 Iron deficiency anemia, unspecified: Secondary | ICD-10-CM | POA: Diagnosis not present

## 2021-12-15 DIAGNOSIS — Z79899 Other long term (current) drug therapy: Secondary | ICD-10-CM | POA: Diagnosis not present

## 2021-12-15 DIAGNOSIS — N186 End stage renal disease: Secondary | ICD-10-CM | POA: Diagnosis not present

## 2021-12-16 ENCOUNTER — Inpatient Hospital Stay
Admission: EM | Admit: 2021-12-16 | Discharge: 2021-12-19 | DRG: 871 | Disposition: A | Discharge status: Home Health | Payer: Medicare Other | Attending: Internal Medicine | Admitting: Internal Medicine

## 2021-12-16 ENCOUNTER — Other Ambulatory Visit: Payer: Self-pay

## 2021-12-16 ENCOUNTER — Emergency Department: Payer: Medicare Other

## 2021-12-16 DIAGNOSIS — J9601 Acute respiratory failure with hypoxia: Secondary | ICD-10-CM | POA: Diagnosis present

## 2021-12-16 DIAGNOSIS — Z7982 Long term (current) use of aspirin: Secondary | ICD-10-CM

## 2021-12-16 DIAGNOSIS — A419 Sepsis, unspecified organism: Secondary | ICD-10-CM | POA: Diagnosis not present

## 2021-12-16 DIAGNOSIS — Z7984 Long term (current) use of oral hypoglycemic drugs: Secondary | ICD-10-CM

## 2021-12-16 DIAGNOSIS — Z20822 Contact with and (suspected) exposure to covid-19: Secondary | ICD-10-CM | POA: Diagnosis present

## 2021-12-16 DIAGNOSIS — N186 End stage renal disease: Secondary | ICD-10-CM | POA: Diagnosis present

## 2021-12-16 DIAGNOSIS — N2581 Secondary hyperparathyroidism of renal origin: Secondary | ICD-10-CM | POA: Diagnosis not present

## 2021-12-16 DIAGNOSIS — D631 Anemia in chronic kidney disease: Secondary | ICD-10-CM | POA: Diagnosis not present

## 2021-12-16 DIAGNOSIS — Z881 Allergy status to other antibiotic agents status: Secondary | ICD-10-CM

## 2021-12-16 DIAGNOSIS — F32A Depression, unspecified: Secondary | ICD-10-CM | POA: Diagnosis present

## 2021-12-16 DIAGNOSIS — G4733 Obstructive sleep apnea (adult) (pediatric): Secondary | ICD-10-CM | POA: Diagnosis present

## 2021-12-16 DIAGNOSIS — Z8249 Family history of ischemic heart disease and other diseases of the circulatory system: Secondary | ICD-10-CM

## 2021-12-16 DIAGNOSIS — J9811 Atelectasis: Secondary | ICD-10-CM | POA: Diagnosis not present

## 2021-12-16 DIAGNOSIS — R059 Cough, unspecified: Secondary | ICD-10-CM | POA: Diagnosis not present

## 2021-12-16 DIAGNOSIS — E1122 Type 2 diabetes mellitus with diabetic chronic kidney disease: Secondary | ICD-10-CM | POA: Diagnosis present

## 2021-12-16 DIAGNOSIS — Z811 Family history of alcohol abuse and dependence: Secondary | ICD-10-CM

## 2021-12-16 DIAGNOSIS — Z91048 Other nonmedicinal substance allergy status: Secondary | ICD-10-CM

## 2021-12-16 DIAGNOSIS — Z79899 Other long term (current) drug therapy: Secondary | ICD-10-CM

## 2021-12-16 DIAGNOSIS — Z794 Long term (current) use of insulin: Secondary | ICD-10-CM

## 2021-12-16 DIAGNOSIS — Z992 Dependence on renal dialysis: Secondary | ICD-10-CM | POA: Diagnosis not present

## 2021-12-16 DIAGNOSIS — E785 Hyperlipidemia, unspecified: Secondary | ICD-10-CM | POA: Diagnosis present

## 2021-12-16 DIAGNOSIS — J189 Pneumonia, unspecified organism: Secondary | ICD-10-CM | POA: Diagnosis present

## 2021-12-16 DIAGNOSIS — I120 Hypertensive chronic kidney disease with stage 5 chronic kidney disease or end stage renal disease: Secondary | ICD-10-CM | POA: Diagnosis present

## 2021-12-16 DIAGNOSIS — D509 Iron deficiency anemia, unspecified: Secondary | ICD-10-CM | POA: Diagnosis not present

## 2021-12-16 DIAGNOSIS — Z833 Family history of diabetes mellitus: Secondary | ICD-10-CM

## 2021-12-16 DIAGNOSIS — E875 Hyperkalemia: Secondary | ICD-10-CM | POA: Diagnosis not present

## 2021-12-16 DIAGNOSIS — R509 Fever, unspecified: Secondary | ICD-10-CM | POA: Diagnosis not present

## 2021-12-16 DIAGNOSIS — R6520 Severe sepsis without septic shock: Secondary | ICD-10-CM | POA: Diagnosis not present

## 2021-12-16 DIAGNOSIS — Z813 Family history of other psychoactive substance abuse and dependence: Secondary | ICD-10-CM

## 2021-12-16 DIAGNOSIS — M1990 Unspecified osteoarthritis, unspecified site: Secondary | ICD-10-CM | POA: Diagnosis present

## 2021-12-16 DIAGNOSIS — E1169 Type 2 diabetes mellitus with other specified complication: Secondary | ICD-10-CM | POA: Diagnosis present

## 2021-12-16 DIAGNOSIS — R82998 Other abnormal findings in urine: Secondary | ICD-10-CM | POA: Diagnosis not present

## 2021-12-16 DIAGNOSIS — Z83438 Family history of other disorder of lipoprotein metabolism and other lipidemia: Secondary | ICD-10-CM

## 2021-12-16 DIAGNOSIS — E1142 Type 2 diabetes mellitus with diabetic polyneuropathy: Secondary | ICD-10-CM | POA: Diagnosis present

## 2021-12-16 DIAGNOSIS — Z818 Family history of other mental and behavioral disorders: Secondary | ICD-10-CM

## 2021-12-16 LAB — COMPREHENSIVE METABOLIC PANEL
ALT: 38 U/L (ref 0–44)
AST: 34 U/L (ref 15–41)
Albumin: 2.9 g/dL — ABNORMAL LOW (ref 3.5–5.0)
Alkaline Phosphatase: 52 U/L (ref 38–126)
Anion gap: 7 (ref 5–15)
BUN: 58 mg/dL — ABNORMAL HIGH (ref 8–23)
CO2: 29 mmol/L (ref 22–32)
Calcium: 8 mg/dL — ABNORMAL LOW (ref 8.9–10.3)
Chloride: 101 mmol/L (ref 98–111)
Creatinine, Ser: 6.93 mg/dL — ABNORMAL HIGH (ref 0.44–1.00)
GFR, Estimated: 6 mL/min — ABNORMAL LOW (ref >60)
Glucose, Bld: 154 mg/dL — ABNORMAL HIGH (ref 70–99)
Potassium: 4.4 mmol/L (ref 3.5–5.1)
Sodium: 137 mmol/L (ref 135–145)
Total Bilirubin: 0.7 mg/dL (ref 0.3–1.2)
Total Protein: 5.8 g/dL — ABNORMAL LOW (ref 6.5–8.1)

## 2021-12-16 LAB — CBC WITH DIFFERENTIAL/PLATELET
Abs Immature Granulocytes: 0.04 10*3/uL (ref 0.00–0.07)
Basophils Absolute: 0 10*3/uL (ref 0.0–0.1)
Basophils Relative: 0 %
Eosinophils Absolute: 0.2 10*3/uL (ref 0.0–0.5)
Eosinophils Relative: 2 %
HCT: 28.2 % — ABNORMAL LOW (ref 36.0–46.0)
Hemoglobin: 9.8 g/dL — ABNORMAL LOW (ref 12.0–15.0)
Immature Granulocytes: 0 %
Lymphocytes Relative: 15 %
Lymphs Abs: 1.6 10*3/uL (ref 0.7–4.0)
MCH: 31.9 pg (ref 26.0–34.0)
MCHC: 34.8 g/dL (ref 30.0–36.0)
MCV: 91.9 fL (ref 80.0–100.0)
Monocytes Absolute: 0.7 10*3/uL (ref 0.1–1.0)
Monocytes Relative: 6 %
Neutro Abs: 8.3 10*3/uL — ABNORMAL HIGH (ref 1.7–7.7)
Neutrophils Relative %: 77 %
Platelets: 165 10*3/uL (ref 150–400)
RBC: 3.07 MIL/uL — ABNORMAL LOW (ref 3.87–5.11)
RDW: 13.9 % (ref 11.5–15.5)
WBC: 10.8 10*3/uL — ABNORMAL HIGH (ref 4.0–10.5)
nRBC: 0 % (ref 0.0–0.2)

## 2021-12-16 LAB — TROPONIN I (HIGH SENSITIVITY): Troponin I (High Sensitivity): 28 ng/L — ABNORMAL HIGH (ref <18)

## 2021-12-16 LAB — RESP PANEL BY RT-PCR (FLU A&B, COVID) ARPGX2
Influenza A by PCR: NEGATIVE
Influenza B by PCR: NEGATIVE
SARS Coronavirus 2 by RT PCR: NEGATIVE

## 2021-12-16 LAB — LIPASE, BLOOD: Lipase: 53 U/L — ABNORMAL HIGH (ref 11–51)

## 2021-12-16 MED ORDER — CEFTRIAXONE SODIUM 1 G IJ SOLR
1.0000 g | Freq: Once | INTRAMUSCULAR | Status: AC
  Administered 2021-12-17: 1 g via INTRAVENOUS
  Filled 2021-12-16: qty 10

## 2021-12-16 MED ORDER — SODIUM CHLORIDE 0.9 % IV SOLN
500.0000 mg | Freq: Once | INTRAVENOUS | Status: AC
  Administered 2021-12-17: 500 mg via INTRAVENOUS
  Filled 2021-12-16: qty 5

## 2021-12-16 MED ORDER — ACETAMINOPHEN 325 MG PO TABS
650.0000 mg | ORAL_TABLET | Freq: Once | ORAL | Status: AC
  Administered 2021-12-16: 650 mg via ORAL

## 2021-12-16 NOTE — ED Notes (Signed)
Collected one set of blood cultures

## 2021-12-16 NOTE — ED Provider Notes (Signed)
[facility] Provider Note    Event Date/Time   First MD Initiated Contact with Patient 12/16/21 2325     (approximate)   History   Cough   HPI  Felicia Thompson is a 76 y.o. female   with PMH of DM, ESRD on peritoneal dialysis, HTN and anxiety/depression, and peripheral neuropathy who presents for assessment accompanied by daughter for 1 week of worsening cough and shortness of breath associate with weakness and fevers.  Patient states he has had not had any abdominal pain, issues with her PD, change in urine output, back pain, headache, earache but does feel very weak.  She states she sometimes will feel little chest tightness when she is coughing but does not otherwise have any chest pain.  She denies any known pulmonary history including COPD or asthma or significant tobacco abuse.  No other acute concerns at this time.      Physical Exam  Triage Vital Signs: ED Triage Vitals  Enc Vitals Group     BP 12/16/21 2032 (!) 192/81     Pulse Rate 12/16/21 2032 88     Resp 12/16/21 2032 (!) 22     Temp 12/16/21 2032 (!) 100.5 F (38.1 C)     Temp Source 12/16/21 2032 Oral     SpO2 12/16/21 2032 91 %     Weight 12/16/21 2034 185 lb (83.9 kg)     Height --      Head Circumference --      Peak Flow --      Pain Score 12/16/21 2033 5     Pain Loc --      Pain Edu? --      Excl. in [place]? --     Most recent vital signs: Vitals:   12/17/21 0030 12/17/21 0100  BP: (!) 173/71 (!) 168/76  Pulse: 79 81  Resp: 18 17  Temp:    SpO2: 97% 97%    General: Awake, Appears mildly uncomfortable. CV:  Good peripheral perfusion.  No murmurs rubs or gallops.  2+ radial pulse Resp:  Normal effort.  Slight tachypneic.  No clear rhonchi or rales. Abd:  No distention.  Soft throughout. Other:  Very mild lower extremity edema.   ED Results / Procedures / Treatments  Labs (all labs ordered are listed, but only abnormal results are displayed) Labs Reviewed   CBC WITH DIFFERENTIAL/PLATELET - Abnormal; Notable for the following components:      Result Value   WBC 10.8 (*)    RBC 3.07 (*)    Hemoglobin 9.8 (*)    HCT 28.2 (*)    Neutro Abs 8.3 (*)    All other components within normal limits  COMPREHENSIVE METABOLIC PANEL - Abnormal; Notable for the following components:   Glucose, Bld 154 (*)    BUN 58 (*)    Creatinine, Ser 6.93 (*)    Calcium 8.0 (*)    Total Protein 5.8 (*)    Albumin 2.9 (*)    GFR, Estimated 6 (*)    All other components within normal limits  LIPASE, BLOOD - Abnormal; Notable for the following components:   Lipase 53 (*)    All other components within normal limits  BRAIN NATRIURETIC PEPTIDE - Abnormal; Notable for the following components:   B Natriuretic Peptide 427.3 (*)    All other components within normal limits  URINALYSIS, COMPLETE (UACMP) WITH MICROSCOPIC - Abnormal; Notable for the following components:   Color, Urine YELLOW (*)  APPearance CLEAR (*)    Hgb urine dipstick TRACE (*)    Protein, ur >300 (*)    Leukocytes,Ua TRACE (*)    Bacteria, UA RARE (*)    Non Squamous Epithelial PRESENT (*)    All other components within normal limits  TROPONIN I (HIGH SENSITIVITY) - Abnormal; Notable for the following components:   Troponin I (High Sensitivity) 28 (*)    All other components within normal limits  TROPONIN I (HIGH SENSITIVITY) - Abnormal; Notable for the following components:   Troponin I (High Sensitivity) 25 (*)    All other components within normal limits  RESP PANEL BY RT-PCR (FLU A&B, COVID) ARPGX2  CULTURE, BLOOD (ROUTINE X 2)  CULTURE, BLOOD (ROUTINE X 2)  LACTIC ACID, PLASMA  PROTIME-INR  APTT  PROCALCITONIN  LACTIC ACID, PLASMA     EKG  EKG is remarkable sinus rhythm with a ventricular rate of 89, normal axis, unremarkable intervals without clear evidence of acute ischemia or significant arrhythmia.   RADIOLOGY  Chest x-ray reviewed by myself shows some patchiness at  the bilateral bases concerning for early pneumonia versus atelectasis.  There is no large effusion pneumothorax or overt edema or other clear acute thoracic process.  I also reviewed radiologist interpretation.   PROCEDURES:  Critical Care performed: Yes, see critical care procedure note(s)  .Critical Care Performed by: Lucrezia Starch, MD Authorized by: Lucrezia Starch, MD   Critical care provider statement:    Critical care time (minutes):  30   Critical care was necessary to treat or prevent imminent or life-threatening deterioration of the following conditions:  Respiratory failure and sepsis   Critical care was time spent personally by me on the following activities:  Development of treatment plan with patient or surrogate, discussions with consultants, evaluation of patient's response to treatment, examination of patient, ordering and review of laboratory studies, ordering and review of radiographic studies, ordering and performing treatments and interventions, pulse oximetry, re-evaluation of patient's condition and review of old charts    MEDICATIONS ORDERED IN ED: Medications  acetaminophen (TYLENOL) tablet 650 mg (650 mg Oral Given 12/16/21 2057)  cefTRIAXone (ROCEPHIN) 1 g in sodium chloride 0.9 % 100 mL IVPB (0 g Intravenous Stopped 12/17/21 0108)  azithromycin (ZITHROMAX) 500 mg in sodium chloride 0.9 % 250 mL IVPB (500 mg Intravenous New Bag/Given 12/17/21 0029)     IMPRESSION / MDM / [place] / ED COURSE  I reviewed the triage vital signs and the nursing notes.                              Differential diagnosis includes, but is not limited to pneumonia, viral bronchitis, CHF, PE, arrhythmia, anemia, metabolic derangements and symptomatic effusion.  EKG is remarkable sinus rhythm with a ventricular rate of 89, normal axis, unremarkable intervals without clear evidence of acute ischemia or significant arrhythmia.  Troponin minimally elevated and downtrending  at 28 and 25 which I suspect likely represents some mild demand ischemia versus chronic troponinemia in the setting of CKD with a lower suspicion for occlusion MI at this time.  COVID influenza PCR is negative CBC shows WBC count of 10.8 and stable hemoglobin at 9.8 compared to 9.32 months ago.  CMP shows stable CKD without other significant acute electrolyte or metabolic derangements.  Lipase not consistent with acute pancreatitis.    Chest x-ray reviewed by myself shows some patchiness at the bilateral bases concerning  for early pneumonia versus atelectasis.  There is no large effusion pneumothorax or overt edema or other clear acute thoracic process.  I also reviewed radiologist interpretation.  BNP slightly elevated 427.  Initial lactic acid 1.3.  Procalcitonin 0.17.  Coags unremarkable.  UA has some rare bacteria but otherwise not convincing for cystitis.  I was able to review patient's most recent echocardiogram from 1/9 that showed intact left LV function with some mild diastolic dysfunction.  There is also notation of some moderate hypertrophy of the basal septum and mild asymmetry of the left ventricular hypertrophy.  Right ventricle is noted be slightly enlarged without increase in right ventricular wall thickness with normal right ventricular systolic function and normal pulmonary artery pressures.  Patient's BNP is slightly elevated she otherwise appears relatively euvolemic given report of fever and more concern for pneumonia at this time no acute CHF.  However will defer fluids as she is at high risk for pulmonary edema given history of diastolic function and CKD.  At this time we will treat with IV antibiotics and admit to medicine service given on trial of ambulation she was not hypoxic in the mid 80s then multiple SIRS criteria with tachycardia tachypnea and fever on arrival concerning for sepsis.     FINAL CLINICAL IMPRESSION(S) / ED DIAGNOSES   Final diagnoses:  Community acquired  pneumonia, unspecified laterality  Sepsis, due to unspecified organism, unspecified whether acute organ dysfunction present ([place])  Acute respiratory failure with hypoxia ([place])     Rx / DC Orders   ED Discharge Orders     None        Note:  This document was prepared using Dragon voice recognition software and may include unintentional dictation errors.   Lucrezia Starch, MD 12/17/21 [phone]

## 2021-12-16 NOTE — ED Notes (Signed)
Pt c/o pain in lower lungs around rib areas bilaterally especially lower left frontal rib area. No SOB at rest but pt reports gets very SOB when talking, eating or having coughing fits.

## 2021-12-16 NOTE — ED Provider Triage Note (Signed)
Emergency Medicine Provider Triage Evaluation Note  Felicia Thompson , a 76 y.o. female  was evaluated in triage.  Pt complains of presents to the emergency with fever, shortness of breath, weakness and generalized malaise at home.  No sick contacts in the home with similar symptoms.  No recent admissions.  Patient denies chest pain and chest tightness.  Review of Systems  Positive: Patient has fever and cough.  Negative: No chest pain or abdominal pain.   Physical Exam  There were no vitals taken for this visit. Gen:   Awake, no distress   Resp:  Patient seems breathless. MSK:   Moves extremities without difficulty Other:    Medical Decision Making  Medically screening exam initiated at 8:32 PM.  Appropriate orders placed.  Natale Barba was informed that the remainder of the evaluation will be completed by another provider, this initial triage assessment does not replace that evaluation, and the importance of remaining in the ED until their evaluation is complete.     Vallarie Mare [place], [place] 12/16/21 2033

## 2021-12-16 NOTE — ED Triage Notes (Signed)
Pt arrives with c/o cough and worsening SOB. Pt endorses fevers.

## 2021-12-17 ENCOUNTER — Encounter: Payer: Self-pay | Admitting: Family Medicine

## 2021-12-17 DIAGNOSIS — R82998 Other abnormal findings in urine: Secondary | ICD-10-CM | POA: Diagnosis not present

## 2021-12-17 DIAGNOSIS — G4733 Obstructive sleep apnea (adult) (pediatric): Secondary | ICD-10-CM | POA: Diagnosis present

## 2021-12-17 DIAGNOSIS — Z8249 Family history of ischemic heart disease and other diseases of the circulatory system: Secondary | ICD-10-CM | POA: Diagnosis not present

## 2021-12-17 DIAGNOSIS — J9811 Atelectasis: Secondary | ICD-10-CM | POA: Diagnosis present

## 2021-12-17 DIAGNOSIS — M1990 Unspecified osteoarthritis, unspecified site: Secondary | ICD-10-CM | POA: Diagnosis present

## 2021-12-17 DIAGNOSIS — J9601 Acute respiratory failure with hypoxia: Secondary | ICD-10-CM

## 2021-12-17 DIAGNOSIS — J189 Pneumonia, unspecified organism: Secondary | ICD-10-CM | POA: Diagnosis not present

## 2021-12-17 DIAGNOSIS — D509 Iron deficiency anemia, unspecified: Secondary | ICD-10-CM | POA: Diagnosis not present

## 2021-12-17 DIAGNOSIS — Z881 Allergy status to other antibiotic agents status: Secondary | ICD-10-CM | POA: Diagnosis not present

## 2021-12-17 DIAGNOSIS — I120 Hypertensive chronic kidney disease with stage 5 chronic kidney disease or end stage renal disease: Secondary | ICD-10-CM | POA: Diagnosis not present

## 2021-12-17 DIAGNOSIS — A419 Sepsis, unspecified organism: Secondary | ICD-10-CM | POA: Diagnosis not present

## 2021-12-17 DIAGNOSIS — Z813 Family history of other psychoactive substance abuse and dependence: Secondary | ICD-10-CM | POA: Diagnosis not present

## 2021-12-17 DIAGNOSIS — Z833 Family history of diabetes mellitus: Secondary | ICD-10-CM | POA: Diagnosis not present

## 2021-12-17 DIAGNOSIS — E1169 Type 2 diabetes mellitus with other specified complication: Secondary | ICD-10-CM | POA: Diagnosis present

## 2021-12-17 DIAGNOSIS — Z83438 Family history of other disorder of lipoprotein metabolism and other lipidemia: Secondary | ICD-10-CM | POA: Diagnosis not present

## 2021-12-17 DIAGNOSIS — E1122 Type 2 diabetes mellitus with diabetic chronic kidney disease: Secondary | ICD-10-CM | POA: Diagnosis not present

## 2021-12-17 DIAGNOSIS — E1142 Type 2 diabetes mellitus with diabetic polyneuropathy: Secondary | ICD-10-CM | POA: Diagnosis present

## 2021-12-17 DIAGNOSIS — E785 Hyperlipidemia, unspecified: Secondary | ICD-10-CM

## 2021-12-17 DIAGNOSIS — D631 Anemia in chronic kidney disease: Secondary | ICD-10-CM | POA: Diagnosis not present

## 2021-12-17 DIAGNOSIS — Z20822 Contact with and (suspected) exposure to covid-19: Secondary | ICD-10-CM | POA: Diagnosis present

## 2021-12-17 DIAGNOSIS — F32A Depression, unspecified: Secondary | ICD-10-CM | POA: Diagnosis present

## 2021-12-17 DIAGNOSIS — N186 End stage renal disease: Secondary | ICD-10-CM | POA: Diagnosis not present

## 2021-12-17 DIAGNOSIS — Z91048 Other nonmedicinal substance allergy status: Secondary | ICD-10-CM | POA: Diagnosis not present

## 2021-12-17 DIAGNOSIS — Z811 Family history of alcohol abuse and dependence: Secondary | ICD-10-CM | POA: Diagnosis not present

## 2021-12-17 DIAGNOSIS — Z79899 Other long term (current) drug therapy: Secondary | ICD-10-CM | POA: Diagnosis not present

## 2021-12-17 DIAGNOSIS — Z992 Dependence on renal dialysis: Secondary | ICD-10-CM | POA: Diagnosis not present

## 2021-12-17 DIAGNOSIS — E875 Hyperkalemia: Secondary | ICD-10-CM | POA: Diagnosis not present

## 2021-12-17 DIAGNOSIS — N2581 Secondary hyperparathyroidism of renal origin: Secondary | ICD-10-CM | POA: Diagnosis not present

## 2021-12-17 DIAGNOSIS — Z818 Family history of other mental and behavioral disorders: Secondary | ICD-10-CM | POA: Diagnosis not present

## 2021-12-17 LAB — CBC
HCT: 23.5 % — ABNORMAL LOW (ref 36.0–46.0)
Hemoglobin: 8.3 g/dL — ABNORMAL LOW (ref 12.0–15.0)
MCH: 32.2 pg (ref 26.0–34.0)
MCHC: 35.3 g/dL (ref 30.0–36.0)
MCV: 91.1 fL (ref 80.0–100.0)
Platelets: 139 10*3/uL — ABNORMAL LOW (ref 150–400)
RBC: 2.58 MIL/uL — ABNORMAL LOW (ref 3.87–5.11)
RDW: 13.9 % (ref 11.5–15.5)
WBC: 8.7 10*3/uL (ref 4.0–10.5)
nRBC: 0 % (ref 0.0–0.2)

## 2021-12-17 LAB — BASIC METABOLIC PANEL
Anion gap: 10 (ref 5–15)
BUN: 61 mg/dL — ABNORMAL HIGH (ref 8–23)
CO2: 25 mmol/L (ref 22–32)
Calcium: 7.3 mg/dL — ABNORMAL LOW (ref 8.9–10.3)
Chloride: 103 mmol/L (ref 98–111)
Creatinine, Ser: 7.19 mg/dL — ABNORMAL HIGH (ref 0.44–1.00)
GFR, Estimated: 6 mL/min — ABNORMAL LOW (ref >60)
Glucose, Bld: 155 mg/dL — ABNORMAL HIGH (ref 70–99)
Potassium: 4.6 mmol/L (ref 3.5–5.1)
Sodium: 138 mmol/L (ref 135–145)

## 2021-12-17 LAB — URINALYSIS, COMPLETE (UACMP) WITH MICROSCOPIC
Bilirubin Urine: NEGATIVE
Glucose, UA: NEGATIVE mg/dL
Ketones, ur: NEGATIVE mg/dL
Nitrite: NEGATIVE
Protein, ur: >300 mg/dL — AB
Specific Gravity, Urine: 1.025 (ref 1.005–1.030)
pH: 6 (ref 5.0–8.0)

## 2021-12-17 LAB — PROCALCITONIN
Procalcitonin: 0.17 ng/mL
Procalcitonin: 0.17 ng/mL

## 2021-12-17 LAB — PROTIME-INR
INR: 1.1 (ref 0.8–1.2)
INR: 1.1 (ref 0.8–1.2)
Prothrombin Time: 13.8 seconds (ref 11.4–15.2)
Prothrombin Time: 14.2 seconds (ref 11.4–15.2)

## 2021-12-17 LAB — CBG MONITORING, ED
Glucose-Capillary: 121 mg/dL — ABNORMAL HIGH (ref 70–99)
Glucose-Capillary: 77 mg/dL (ref 70–99)

## 2021-12-17 LAB — CORTISOL-AM, BLOOD: Cortisol - AM: 20.3 ug/dL (ref 6.7–22.6)

## 2021-12-17 LAB — GLUCOSE, CAPILLARY
Glucose-Capillary: 67 mg/dL — ABNORMAL LOW (ref 70–99)
Glucose-Capillary: 153 mg/dL — ABNORMAL HIGH (ref 70–99)

## 2021-12-17 LAB — HEMOGLOBIN A1C
Hgb A1c MFr Bld: 6.8 % — ABNORMAL HIGH (ref 4.8–5.6)
Mean Plasma Glucose: 148.46 mg/dL

## 2021-12-17 LAB — LACTIC ACID, PLASMA
Lactic Acid, Venous: 1.1 mmol/L (ref 0.5–1.9)
Lactic Acid, Venous: 1.3 mmol/L (ref 0.5–1.9)

## 2021-12-17 LAB — APTT: aPTT: 32 seconds (ref 24–36)

## 2021-12-17 LAB — TROPONIN I (HIGH SENSITIVITY): Troponin I (High Sensitivity): 25 ng/L — ABNORMAL HIGH (ref <18)

## 2021-12-17 LAB — BRAIN NATRIURETIC PEPTIDE: B Natriuretic Peptide: 427.3 pg/mL — ABNORMAL HIGH (ref 0.0–100.0)

## 2021-12-17 MED ORDER — ASPIRIN 81 MG PO CHEW
81.0000 mg | CHEWABLE_TABLET | Freq: Every day | ORAL | Status: DC
  Administered 2021-12-17 – 2021-12-19 (×3): 81 mg via ORAL
  Filled 2021-12-17 (×3): qty 1

## 2021-12-17 MED ORDER — INSULIN ASPART 100 UNIT/ML IJ SOLN: 0.0000 [IU] | Freq: Every day | INTRAMUSCULAR | Status: DC

## 2021-12-17 MED ORDER — TRAZODONE HCL 50 MG PO TABS: 25.0000 mg | ORAL_TABLET | Freq: Every evening | ORAL | Status: DC | PRN

## 2021-12-17 MED ORDER — ATORVASTATIN CALCIUM 20 MG PO TABS
80.0000 mg | ORAL_TABLET | Freq: Every day | ORAL | Status: DC
  Administered 2021-12-17 – 2021-12-19 (×3): 80 mg via ORAL
  Filled 2021-12-17 (×3): qty 4

## 2021-12-17 MED ORDER — ONDANSETRON HCL 4 MG/2ML IJ SOLN: 4.0000 mg | Freq: Four times a day (QID) | INTRAMUSCULAR | Status: DC | PRN

## 2021-12-17 MED ORDER — ONDANSETRON HCL 4 MG PO TABS: 4.0000 mg | ORAL_TABLET | Freq: Four times a day (QID) | ORAL | Status: DC | PRN

## 2021-12-17 MED ORDER — CARVEDILOL 25 MG PO TABS
25.0000 mg | ORAL_TABLET | Freq: Two times a day (BID) | ORAL | Status: DC
  Administered 2021-12-17 – 2021-12-19 (×5): 25 mg via ORAL
  Filled 2021-12-17 (×5): qty 1

## 2021-12-17 MED ORDER — INSULIN ASPART PROT & ASPART (70-30 MIX) 100 UNIT/ML ~~LOC~~ SUSP
10.0000 [IU] | Freq: Every day | SUBCUTANEOUS | Status: DC
  Administered 2021-12-17 – 2021-12-19 (×3): 10 [IU] via SUBCUTANEOUS
  Filled 2021-12-17: qty 10

## 2021-12-17 MED ORDER — IPRATROPIUM-ALBUTEROL 0.5-2.5 (3) MG/3ML IN SOLN
3.0000 mL | RESPIRATORY_TRACT | Status: DC | PRN
  Administered 2021-12-18 (×2): 3 mL via RESPIRATORY_TRACT
  Filled 2021-12-17 (×2): qty 3

## 2021-12-17 MED ORDER — GENTAMICIN SULFATE 0.1 % EX CREA
1.0000 "application " | TOPICAL_CREAM | Freq: Every day | CUTANEOUS | Status: DC
  Administered 2021-12-19: 1 via TOPICAL
  Filled 2021-12-17 (×2): qty 15

## 2021-12-17 MED ORDER — RENA-VITE PO TABS
1.0000 | ORAL_TABLET | Freq: Every day | ORAL | Status: DC
  Administered 2021-12-17 – 2021-12-19 (×3): 1 via ORAL
  Filled 2021-12-17 (×3): qty 1

## 2021-12-17 MED ORDER — GLIPIZIDE 5 MG PO TABS
5.0000 mg | ORAL_TABLET | Freq: Every day | ORAL | Status: DC
  Administered 2021-12-17: 5 mg via ORAL
  Filled 2021-12-17: qty 1

## 2021-12-17 MED ORDER — HEPARIN 1000 UNIT/ML FOR PERITONEAL DIALYSIS
500.0000 [IU] | INTRAMUSCULAR | Status: DC | PRN
  Filled 2021-12-17: qty 0.5

## 2021-12-17 MED ORDER — GUAIFENESIN ER 600 MG PO TB12
600.0000 mg | ORAL_TABLET | Freq: Two times a day (BID) | ORAL | Status: DC
  Administered 2021-12-17 – 2021-12-19 (×5): 600 mg via ORAL
  Filled 2021-12-17 (×5): qty 1

## 2021-12-17 MED ORDER — LOSARTAN POTASSIUM 50 MG PO TABS
50.0000 mg | ORAL_TABLET | Freq: Every day | ORAL | Status: DC
  Administered 2021-12-17 – 2021-12-19 (×3): 50 mg via ORAL
  Filled 2021-12-17 (×3): qty 1

## 2021-12-17 MED ORDER — DELFLEX-LC/1.5% DEXTROSE 344 MOSM/L IP SOLN
INTRAPERITONEAL | Status: DC
  Filled 2021-12-17 (×2): qty 3000

## 2021-12-17 MED ORDER — ACETAMINOPHEN 325 MG PO TABS: 650.0000 mg | ORAL_TABLET | Freq: Four times a day (QID) | ORAL | Status: DC | PRN

## 2021-12-17 MED ORDER — SODIUM CHLORIDE 0.9 % IV SOLN
500.0000 mg | INTRAVENOUS | Status: DC
  Administered 2021-12-17 – 2021-12-18 (×2): 500 mg via INTRAVENOUS
  Filled 2021-12-17: qty 500
  Filled 2021-12-17: qty 5
  Filled 2021-12-17: qty 500

## 2021-12-17 MED ORDER — ENOXAPARIN SODIUM 40 MG/0.4ML IJ SOSY: 40.0000 mg | PREFILLED_SYRINGE | INTRAMUSCULAR | Status: DC

## 2021-12-17 MED ORDER — VENLAFAXINE HCL ER 75 MG PO CP24
150.0000 mg | ORAL_CAPSULE | Freq: Every day | ORAL | Status: DC
  Administered 2021-12-17 – 2021-12-19 (×3): 150 mg via ORAL
  Filled 2021-12-17: qty 2
  Filled 2021-12-17: qty 1
  Filled 2021-12-17: qty 2

## 2021-12-17 MED ORDER — MAGNESIUM HYDROXIDE 400 MG/5ML PO SUSP
30.0000 mL | Freq: Every day | ORAL | Status: DC | PRN
  Filled 2021-12-17: qty 30

## 2021-12-17 MED ORDER — SODIUM CHLORIDE 0.9 % IV SOLN: INTRAVENOUS | Status: DC

## 2021-12-17 MED ORDER — HEPARIN SODIUM (PORCINE) 5000 UNIT/ML IJ SOLN
5000.0000 [IU] | Freq: Three times a day (TID) | INTRAMUSCULAR | Status: DC
  Administered 2021-12-17 – 2021-12-19 (×8): 5000 [IU] via SUBCUTANEOUS
  Filled 2021-12-17 (×8): qty 1

## 2021-12-17 MED ORDER — ACETAMINOPHEN 650 MG RE SUPP: 650.0000 mg | Freq: Four times a day (QID) | RECTAL | Status: DC | PRN

## 2021-12-17 MED ORDER — RISAQUAD PO CAPS
1.0000 | ORAL_CAPSULE | Freq: Every day | ORAL | Status: DC
  Administered 2021-12-17 – 2021-12-18 (×2): 1 via ORAL
  Filled 2021-12-17 (×2): qty 1

## 2021-12-17 MED ORDER — DELFLEX-LC/2.5% DEXTROSE 394 MOSM/L IP SOLN
INTRAPERITONEAL | Status: DC
  Filled 2021-12-17 (×2): qty 3000

## 2021-12-17 MED ORDER — GUAIFENESIN-DM 100-10 MG/5ML PO SYRP
5.0000 mL | ORAL_SOLUTION | Freq: Once | ORAL | Status: AC
  Administered 2021-12-17: 22:00:00 5 mL via ORAL
  Filled 2021-12-17: qty 5

## 2021-12-17 MED ORDER — CALCIUM CARBONATE 1250 (500 CA) MG PO TABS
1.0000 | ORAL_TABLET | Freq: Two times a day (BID) | ORAL | Status: DC
  Administered 2021-12-17 – 2021-12-19 (×4): 500 mg via ORAL
  Filled 2021-12-17 (×6): qty 1

## 2021-12-17 MED ORDER — BENZONATATE 100 MG PO CAPS
100.0000 mg | ORAL_CAPSULE | Freq: Two times a day (BID) | ORAL | Status: DC | PRN
  Administered 2021-12-17: 18:00:00 100 mg via ORAL
  Filled 2021-12-17: qty 1

## 2021-12-17 MED ORDER — SEVELAMER CARBONATE 800 MG PO TABS
2400.0000 mg | ORAL_TABLET | Freq: Three times a day (TID) | ORAL | Status: DC
  Administered 2021-12-17 – 2021-12-19 (×8): 2400 mg via ORAL
  Filled 2021-12-17 (×8): qty 3

## 2021-12-17 MED ORDER — SODIUM CHLORIDE 0.9 % IV SOLN
2.0000 g | INTRAVENOUS | Status: DC
  Administered 2021-12-17 – 2021-12-19 (×3): 2 g via INTRAVENOUS
  Filled 2021-12-17 (×2): qty 20
  Filled 2021-12-17 (×2): qty 2

## 2021-12-17 MED ORDER — INSULIN ASPART 100 UNIT/ML IJ SOLN
0.0000 [IU] | Freq: Three times a day (TID) | INTRAMUSCULAR | Status: DC
  Administered 2021-12-18: 2 [IU] via SUBCUTANEOUS
  Administered 2021-12-18: 19:00:00 5 [IU] via SUBCUTANEOUS
  Filled 2021-12-17 (×2): qty 1

## 2021-12-17 NOTE — Progress Notes (Signed)
CODE SEPSIS - PHARMACY COMMUNICATION  **Broad Spectrum Antibiotics should be administered within 1 hour of Sepsis diagnosis**  Time Code Sepsis Called/Page Received: 0138  Antibiotics Ordered: Azithromycin & Ceftriaxone  Time of 1st antibiotic administration: 0027  Renda Rolls, PharmD, [facility] 12/17/2021 1:43 AM

## 2021-12-17 NOTE — Progress Notes (Signed)
Elink following code sepsis °

## 2021-12-17 NOTE — ED Notes (Signed)
Pt asked if would be getting peritoneal dialysis since not able to do it since not at home overnight. Let her know that would up to the day MD but would pass that along and also to relay that to the MD when coming for morning update to pt.

## 2021-12-17 NOTE — ED Notes (Addendum)
Ambulated pt in room around room twice with portable monitoring. Patient became SOB walking and had multiple coughing fits. Originally pt started out at 97% SPO2 and then after the second walk around room desaturated to 85%. HR also increased from 80's to 100's. Pt reports gets very SOB walking around at home and has to walk up stairs to get in house and then has one flight of stairs in house. EDP notified.

## 2021-12-17 NOTE — ED Notes (Signed)
Assisted patient to use toilet in room to urinate. Pt walked slowly but steady on feet but had a few coughing fits and reported she was SOB when getting back in bed.

## 2021-12-17 NOTE — ED Notes (Signed)
Pt given breakfast tray at this time. 

## 2021-12-17 NOTE — Progress Notes (Signed)
Brief hospitalist update note.  This is a nonbillable note.  Please see same-day H&P for full billable details.  Briefly, this is a 76 year old female history significant for hypertension, sleep apnea, dyslipidemia, ESRD on peritoneal dialysis who presents to the ED with progressive weakness associate with cough and shortness of breath and chest congestion.  On presentation patient meets sepsis criteria with likely source pneumonia.  Started on empiric CAP treatment.  Neurology engaged for peritoneal dialysis.  Patient will do PD in hospital this morning.  Remainder of care plan per H&P  Ralene Muskrat MD  No charge

## 2021-12-17 NOTE — ED Notes (Signed)
Second set of blood cultures obtained from second peripheral IV

## 2021-12-17 NOTE — ED Notes (Signed)
Assisted pt to ambulate to in room toilet to urinate. Pt slightly unsteady on feet and after walking back to bed reported being SOB. Pt reports at home SOB when walking and especially when having to go up stairs. SPO2 when reattached to monitor was 90%. EDP notified. Per EDP also do ambulation trial with pulse ox monitoring.

## 2021-12-17 NOTE — ED Notes (Signed)
Pt urinated 20 ml and sent to lab for urinalysis

## 2021-12-17 NOTE — ED Notes (Addendum)
Provided pt with saltine crackers and cranberry juice. When pt eating she desaturated to high 80's/low 90's.

## 2021-12-17 NOTE — ED Notes (Addendum)
Lab called regarding pt's labs that need to be collected. Lab to add on A1C.

## 2021-12-17 NOTE — H&P (Signed)
[place]   PATIENT NAME: Felicia Thompson    MR#:  [id number]  DATE OF BIRTH:  08-May-1946  DATE OF ADMISSION:  12/16/2021  PRIMARY CARE PHYSICIAN: Isaac Bliss, Rayford Halsted, MD   Patient is coming from: Home  REQUESTING/REFERRING PHYSICIAN: Hulan Saas, MD  CHIEF COMPLAINT:   Chief Complaint  Patient presents with   Cough    HISTORY OF PRESENT ILLNESS:  Mystery Schrupp is a 76 y.o. Caucasian female with medical history significant for end-stage renal disease on peritoneal dialysis, hypertension, dyslipidemia and obstructive sleep apnea, who presented to the ER with acute onset of generalized weakness as well as cough with chest congestion and inability to expectorate as well as fever and chills.  Her symptoms have been worsening over the last 10 days.  No nausea or vomiting or abdominal pain.  She admits to dyspnea on exertion but denies any lower extremity edema or orthopnea or paroxysmal nocturnal dyspnea.  No nausea or vomiting or diarrhea or abdominal pain.  No dysuria, oliguria or hematuria or flank pain.  ED Course: When the patient came to the ER temperature was 100.5/38.1 with a BP 192/81 and t rest.  Elder Love rate of 22 with pulse oximetry of 91%.  It has dropped to the 80s with exertion.  Labs reviewed and blood glucose of 154 and BUN of 58 and creatinine 6.93 with a calcium of 8, albumin 2.9 lipase 53 with total protein of 5.8 and high-sensitivity troponin 528 and later 25 with BNP of 427.3 and lactic acid 1.3 and later 1.1 with procalcitonin 0.17.  CBC showed WBC of 10.8 with neutrophilia, hemoglobin of 9.8 and hematocrit 28.2.  Coagulation profile was within normal and UA came back with 21-50 WBCs, rare bacteria more than 315 and trace leukocytes with negative nitrites.  Blood cultures were drawn EKG as reviewed by me : EKG showed normal sinus rhythm with rate 89 Imaging: 2 view chest x-ray showed suspected right basal infiltrate with suspected  atelectasis.  The patient was given a gram of IV Rocephin and 500 mg of p.o. Zithromax with 650 mg of p.o. Tylenol.  She will be admitted to a medical telemetry bed for further evaluation and management. PAST MEDICAL HISTORY:   Past Medical History:  Diagnosis Date   Arthritis    Blood in stool    when appendix burst   Chronic kidney disease    Depression    DM (diabetes mellitus), type 2 with complications (HCC)    ESRD on peritoneal dialysis ([place])    History of fainting spells of unknown cause    Hyperlipidemia    Hyperlipidemia associated with type 2 diabetes mellitus (HCC)    Hypertension    OSA (obstructive sleep apnea)     PAST SURGICAL HISTORY:   Past Surgical History:  Procedure Laterality Date   ABDOMINAL HYSTERECTOMY     APPENDECTOMY     CHOLECYSTECTOMY     TONSILECTOMY, ADENOIDECTOMY, BILATERAL MYRINGOTOMY AND TUBES      SOCIAL HISTORY:   Social History   Tobacco Use   Smoking status: Never   Smokeless tobacco: Never  Substance Use Topics   Alcohol use: Never    FAMILY HISTORY:   Family History  Problem Relation Age of Onset   Hearing loss Father    Hyperlipidemia Father    Heart disease Father    Early death Father    Cancer Father    Early death Brother    Drug abuse Brother  Diabetes Brother    Depression Brother    Alcohol abuse Brother    Miscarriages / [place] Daughter    Arthritis Daughter    Depression Daughter    Early death Maternal Grandmother    Heart attack Maternal Grandmother    Heart disease Maternal Grandmother    Hearing loss Paternal Grandmother    Heart disease Paternal Grandmother    Heart disease Paternal Grandfather    Early death Paternal Grandfather    Heart attack Paternal Grandfather     DRUG ALLERGIES:   Allergies  Allergen Reactions   Tape Other (See Comments)    Blisters   Tetracyclines & Related Rash    REVIEW OF SYSTEMS:   ROS As per history of present illness. All pertinent systems were  reviewed above. Constitutional, HEENT, cardiovascular, respiratory, GI, GU, musculoskeletal, neuro, psychiatric, endocrine, integumentary and hematologic systems were reviewed and are otherwise negative/unremarkable except for positive findings mentioned above in the HPI.   MEDICATIONS AT HOME:   Prior to Admission medications   Medication Sig Start Date End Date Taking? Authorizing Provider  aspirin 81 MG chewable tablet Chew 81 mg by mouth daily.   Yes [provider]  atorvastatin (LIPITOR) 80 MG tablet TAKE ONE TABLET BY MOUTH DAILY 11/13/21  Yes Isaac Bliss, Rayford Halsted, MD  benzonatate (TESSALON PERLES) 100 MG capsule 1-2 capsules as needed cough 12/13/21  Yes Colin Benton R, DO  carvedilol (COREG) 25 MG tablet Take 25 mg by mouth 2 (two) times daily with a meal.   Yes [provider]  Continuous Blood Gluc Sensor (FREESTYLE LIBRE 2 SENSOR) MISC 1 Device by Does not apply route every 14 (fourteen) days. 10/15/21  Yes Renato Shin, MD  glipiZIDE (GLUCOTROL) 5 MG tablet Take 1 tablet (5 mg total) by mouth daily before breakfast. 10/15/21  Yes Renato Shin, MD  insulin NPH-regular Human (HUMULIN 70/30) (70-30) 100 UNIT/ML injection Inject 10 Units into the skin daily with breakfast. 08/17/21  Yes Renato Shin, MD  losartan (COZAAR) 50 MG tablet Take 1 tablet (50 mg total) by mouth daily. 10/12/21  Yes Lora Havens, MD  Multiple Vitamins-Minerals (RENAPLEX) TABS Take 1 tablet by mouth daily. 08/30/21  Yes [provider]  Probiotic Product ([employer] ADV PROBIOTIC GUMMIES PO) Take 1 tablet by mouth at bedtime.   Yes [provider]  sevelamer carbonate (RENVELA) 800 MG tablet Take 2,400 mg by mouth 3 (three) times daily with meals.   Yes [provider]  venlafaxine XR (EFFEXOR-XR) 150 MG 24 hr capsule Take 150 mg by mouth daily with breakfast.   Yes [provider]      VITAL SIGNS:  Blood pressure (!) 168/76, pulse 81, temperature 99.3  F (37.4 C), temperature source Oral, resp. rate 17, weight 83.9 kg, SpO2 97 %.  PHYSICAL EXAMINATION:  Physical Exam  GENERAL:  76 y.o.-year-old Caucasian female patient lying in the bed with mild respiratory distress with conversational dyspnea.  EYES: Pupils equal, round, reactive to light and accommodation. No scleral icterus. Extraocular muscles intact.  HEENT: Head atraumatic, normocephalic. Oropharynx and nasopharynx clear.  NECK:  Supple, no jugular venous distention. No thyroid enlargement, no tenderness.  LUNGS: Diminished bibasilar breath sounds with right basal crackles. CARDIOVASCULAR: Regular rate and rhythm, S1, S2 normal. No murmurs, rubs, or gallops.  ABDOMEN: Soft, nondistended, nontender. Bowel sounds present. No organomegaly or mass.  EXTREMITIES: No pedal edema, cyanosis, or clubbing.  NEUROLOGIC: Cranial nerves II through XII are intact. Muscle strength 5/5  in all extremities. Sensation intact. Gait not checked.  PSYCHIATRIC: The patient is alert and oriented x 3.  Normal affect and good eye contact. SKIN: No obvious rash, lesion, or ulcer.   LABORATORY PANEL:   CBC Recent Labs  Lab 12/16/21 2045  WBC 10.8*  HGB 9.8*  HCT 28.2*  PLT 165   ------------------------------------------------------------------------------------------------------------------  Chemistries  Recent Labs  Lab 12/16/21 2045  NA 137  K 4.4  CL 101  CO2 29  GLUCOSE 154*  BUN 58*  CREATININE 6.93*  CALCIUM 8.0*  AST 34  ALT 38  ALKPHOS 52  BILITOT 0.7   ------------------------------------------------------------------------------------------------------------------  Cardiac Enzymes No results for input(s): TROPONINI in the last 168 hours. ------------------------------------------------------------------------------------------------------------------  RADIOLOGY:  DG Chest 2 View  Result Date: 12/16/2021 CLINICAL DATA:  Cough and fever EXAM: CHEST - 2 VIEW COMPARISON:   None. FINDINGS: The heart size and mediastinal contours are within normal limits. Bibasilar atelectasis. Otherwise both lungs are clear. The visualized skeletal structures are unremarkable. IMPRESSION: Bibasilar atelectasis. Electronically Signed   By: Ulyses Jarred M.D.   On: 12/16/2021 21:21      IMPRESSION AND PLAN:  Principal Problem:   Sepsis due to pneumonia ([place])  1.  Sepsis due to community-acquired pneumonia likely right basal.  Sepsis is manifested by a fever of 38.1 with respiratory to 22 - The patient will be admitted to a medical telemetry bed. - We will continue antibiotic therapy with IV Rocephin and Zithromax. - We will follow blood and sputum cultures. - Mucolytic therapy and bronchodilator therapy will be provided.  2.  Acute hypoxic respiratory failure due to #1. - O2 protocol will be followed.  3.  Dyslipidemia. - We will continue statin therapy.  4.  Essential hypertension. - We will continue Coreg and Cozaar.  5.  End-stage renal disease on peritoneal dialysis. - Nephrology consult will be obtained for follow-up.    6.  Type 2 diabetes mellitus. - We will place the patient on supplement coverage with NovoLog. - We will continue basal coverage and glipizide.  7.  Depression. - We will continue Effexor XR.  8.  Obstructive sleep apnea. - We will place the patient on CPAP nightly.  DVT prophylaxis: Lovenox. Code Status: full code. Family Communication:  The plan of care was discussed in details with the patient (and family). I answered all questions. The patient agreed to proceed with the above mentioned plan. Further management will depend upon hospital course. Disposition Plan: Back to previous home environment Consults called: Nephrology. All the records are reviewed and case discussed with ED provider.  Status is: Inpatient   At the time of the admission, it appears that the appropriate admission status for this patient is inpatient.  This is  judged to be reasonable and necessary in order to provide the required intensity of service to ensure the patient's safety given the presenting symptoms, physical exam findings and initial radiographic and laboratory data in the context of comorbid conditions.  The patient requires inpatient status due to high intensity of service, high risk of further deterioration and high frequency of surveillance required.  I certify that at the time of admission, it is my clinical judgment that the patient will require inpatient hospital care extending more than 2 midnights.                            Dispo: The patient is from: Home  Anticipated d/c is to: Home              Patient currently is not medically stable to d/c.              Difficult to place patient: No  Christel Mormon M.D on 12/17/2021 at 2:40 AM  [facility]   From 7 PM-7 AM, contact night-coverage [email or web address]  CC: Primary care physician; Isaac Bliss, Rayford Halsted, MD

## 2021-12-17 NOTE — Progress Notes (Addendum)
[facility]  ROUNDING NOTE   Subjective:   Felicia Thompson is a 76 y.o. female with past medical history of hypertension, sleep apnea, dyslipidemia, and end-stage renal disease on peritoneal dialysis.  Patient reports to the emergency room with progressive weakness foot and weakness with cough and congestion.  Patient has been admitted for Sepsis due to pneumonia [facility]) [J18.9, A41.9]  Patient is known to our clinic and receives outpatient peritoneal dialysis treatment under the supervision of Dr. Juleen China at [facility].  Patient states she missed her scheduled treatment last night.  Patient has done some recent traveling out of state and has 2 known sick contacts.  Patient states she has felt bad for over a week and has progressively gotten worse.  Patient has nonproductive cough.  Denies nausea, vomiting, and diarrhea.  Denies abdominal pain.  Reports shortness of breath with activity.  Chest x-ray shows bibasilar atelectasis.  Lab work within acceptable range for renal patient   Objective:  Vital signs in last 24 hours:  Temp:  [99.3 F (37.4 C)-100.5 F (38.1 C)] 99.3 F (37.4 C) (01/15 2348) Pulse Rate:  [77-89] 85 (01/16 1000) Resp:  [17-25] 23 (01/16 1000) BP: (154-192)/(64-83) 159/71 (01/16 1000) SpO2:  [91 %-97 %] 92 % (01/16 1000) Weight:  [83.9 kg] 83.9 kg (01/15 2034)  Weight change:  Filed Weights   12/16/21 2034  Weight: 83.9 kg    Intake/Output: I/O last 3 completed shifts: In: 350 [IV Piggyback:350] Out: -    Intake/Output this shift:  No intake/output data recorded.  Physical Exam: General: NAD, resting on stretcher  Head: Normocephalic, atraumatic. Moist oral mucosal membranes  Eyes: Anicteric  Lungs:  Diminished in bases, normal effort, room air, nonproductive cough  Heart: Regular rate and rhythm  Abdomen:  Soft, nontender  Extremities: 1+ peripheral edema.  Neurologic: Alert, moving all four extremities  Skin: No lesions   Access: PD catheter    Basic Metabolic Panel: Recent Labs  Lab 12/16/21 2045 12/17/21 0607  NA 137 138  K 4.4 4.6  CL 101 103  CO2 29 25  GLUCOSE 154* 155*  BUN 58* 61*  CREATININE 6.93* 7.19*  CALCIUM 8.0* 7.3*    Liver Function Tests: Recent Labs  Lab 12/16/21 2045  AST 34  ALT 38  ALKPHOS 52  BILITOT 0.7  PROT 5.8*  ALBUMIN 2.9*   Recent Labs  Lab 12/16/21 2045  LIPASE 53*   No results for input(s): AMMONIA in the last 168 hours.  CBC: Recent Labs  Lab 12/16/21 2045 12/17/21 0607  WBC 10.8* 8.7  NEUTROABS 8.3*  --   HGB 9.8* 8.3*  HCT 28.2* 23.5*  MCV 91.9 91.1  PLT 165 139*    Cardiac Enzymes: No results for input(s): CKTOTAL, CKMB, CKMBINDEX, TROPONINI in the last 168 hours.  BNP: Invalid input(s): POCBNP  CBG: Recent Labs  Lab 12/17/21 0751  GLUCAP 121*    Microbiology: Results for orders placed or performed during the hospital encounter of 12/16/21  Resp Panel by RT-PCR (Flu A&B, Covid) Nasopharyngeal Swab     Status: None   Collection Time: 12/16/21  8:35 PM   Specimen: Nasopharyngeal Swab; Nasopharyngeal(NP) swabs in vial transport medium  Result Value Ref Range Status   SARS Coronavirus 2 by RT PCR NEGATIVE NEGATIVE Final    Comment: (NOTE) SARS-CoV-2 target nucleic acids are NOT DETECTED.  The SARS-CoV-2 RNA is generally detectable in upper respiratory specimens during the acute phase of infection. The lowest concentration of  SARS-CoV-2 viral copies this assay can detect is 138 copies/mL. A negative result does not preclude SARS-Cov-2 infection and should not be used as the sole basis for treatment or other patient management decisions. A negative result may occur with  improper specimen collection/handling, submission of specimen other than nasopharyngeal swab, presence of viral mutation(s) within the areas targeted by this assay, and inadequate number of viral copies(<138 copies/mL). A negative result must be combined  with clinical observations, patient history, and epidemiological information. The expected result is Negative.  Fact Sheet for Patients:  EntrepreneurPulse.com.au  Fact Sheet for Healthcare Providers:  IncredibleEmployment.be  This test is no t yet approved or cleared by the [place] FDA and  has been authorized for detection and/or diagnosis of SARS-CoV-2 by FDA under an Emergency Use Authorization (EUA). This EUA will remain  in effect (meaning this test can be used) for the duration of the COVID-19 declaration under Section 564(b)(1) of the Act, 21 U.S.C.section 360bbb-3(b)(1), unless the authorization is terminated  or revoked sooner.       Influenza A by PCR NEGATIVE NEGATIVE Final   Influenza B by PCR NEGATIVE NEGATIVE Final    Comment: (NOTE) The Xpert Xpress SARS-CoV-2/FLU/RSV plus assay is intended as an aid in the diagnosis of influenza from Nasopharyngeal swab specimens and should not be used as a sole basis for treatment. Nasal washings and aspirates are unacceptable for Xpert Xpress SARS-CoV-2/FLU/RSV testing.  Fact Sheet for Patients: EntrepreneurPulse.com.au  Fact Sheet for Healthcare Providers: IncredibleEmployment.be  This test is not yet approved or cleared by the [place] FDA and has been authorized for detection and/or diagnosis of SARS-CoV-2 by FDA under an Emergency Use Authorization (EUA). This EUA will remain in effect (meaning this test can be used) for the duration of the COVID-19 declaration under Section 564(b)(1) of the Act, 21 U.S.C. section 360bbb-3(b)(1), unless the authorization is terminated or revoked.  Performed at [facility], [address] [place], [place] [address]   Blood culture (routine x 2)     Status: None (Preliminary result)   Collection Time: 12/16/21  8:57 PM   Specimen: BLOOD  Result Value Ref Range Status   Specimen  Description BLOOD LEFT ANTECUBITAL  Final   Special Requests   Final    BOTTLES DRAWN AEROBIC AND ANAEROBIC Blood Culture adequate volume   Culture   Final    NO GROWTH < 12 HOURS Performed at [facility], [address] [place], [place] [address]    Report Status PENDING  Incomplete  Blood culture (routine x 2)     Status: None (Preliminary result)   Collection Time: 12/16/21 11:34 PM   Specimen: BLOOD  Result Value Ref Range Status   Specimen Description BLOOD RIGHT ANTECUBITAL  Final   Special Requests   Final    BOTTLES DRAWN AEROBIC AND ANAEROBIC Blood Culture adequate volume   Culture   Final    NO GROWTH < 12 HOURS Performed at [facility], [address] [place], [place] [address]    Report Status PENDING  Incomplete    Coagulation Studies: Recent Labs    12/16/21 2334 12/17/21 0607  LABPROT 13.8 14.2  INR 1.1 1.1    Urinalysis: Recent Labs    12/17/21 0031  COLORURINE YELLOW*  LABSPEC 1.025  PHURINE 6.0  GLUCOSEU NEGATIVE  HGBUR TRACE*  BILIRUBINUR NEGATIVE  KETONESUR NEGATIVE  PROTEINUR >300*  NITRITE NEGATIVE  LEUKOCYTESUR TRACE*      Imaging: DG Chest 2  View  Result Date: 12/16/2021 CLINICAL DATA:  Cough and fever EXAM: CHEST - 2 VIEW COMPARISON:  None. FINDINGS: The heart size and mediastinal contours are within normal limits. Bibasilar atelectasis. Otherwise both lungs are clear. The visualized skeletal structures are unremarkable. IMPRESSION: Bibasilar atelectasis. Electronically Signed   By: Ulyses Jarred M.D.   On: 12/16/2021 21:21     Medications:    azithromycin     cefTRIAXone (ROCEPHIN)  IV      acidophilus  1 capsule Oral QHS   aspirin  81 mg Oral Daily   atorvastatin  80 mg Oral Daily   carvedilol  25 mg Oral BID WC   guaiFENesin  600 mg Oral BID   heparin injection (subcutaneous)  5,000 Units Subcutaneous Q8H   insulin aspart  0-15 Units Subcutaneous TID WC   insulin aspart  0-5 Units Subcutaneous QHS    insulin aspart protamine- aspart  10 Units Subcutaneous Q breakfast   losartan  50 mg Oral Daily   multivitamin  1 tablet Oral Daily   sevelamer carbonate  2,400 mg Oral TID WC   venlafaxine XR  150 mg Oral Q breakfast   acetaminophen **OR** acetaminophen, benzonatate, ipratropium-albuterol, magnesium hydroxide, ondansetron **OR** ondansetron (ZOFRAN) IV, traZODone  Assessment/ Plan:  Felicia Thompson is a 76 y.o.  female with past medical history of hypertension, sleep apnea, dyslipidemia, and end-stage renal disease on peritoneal dialysis.  Patient reports to the emergency room with progressive weakness foot and weakness with cough and congestion.  Patient has been admitted for Sepsis due to pneumonia ([facility]) [J18.9, A41.9]   [facility] PD [place] 81 kg PD- 2L fills, 4 cycles, 8 hrs, 10/20/no last fill  End-stage renal disease on peritoneal dialysis.  We will continue nightly treatments during this admission.  Orders placed.  2. Anemia of chronic kidney disease   Lab Results  Component Value Date   HGB 8.3 (L) 12/17/2021    Hemoglobin below target.  3. Secondary Hyperparathyroidism: with outpatient labs: PTH 188, phosphorus 4.1, calcium 7.8 on 11/20/21.   Lab Results  Component Value Date   CALCIUM 7.3 (L) 12/17/2021   PHOS 7.6 (H) 10/09/2021  Calcium and phosphorus not within desired target.  Currently prescribed calcium carbonate and Renvela with meals outpatient.  4.  Hypertension with chronic kidney disease.  Home regimen includes carvedilol and losartan.  Currently receiving both this admission.  5. 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 Home regimen includes glipizide and NPH. Most recent hemoglobin A1c is 5.5 on 11/20/21.   6.  Community-acquired pneumonia, right basilar.  Antibiotics include Rocephin and azithromycin, along with supportive care therapies.     LOS: 0 Javarri Segal 1/16/202311:27 AM

## 2021-12-18 LAB — BASIC METABOLIC PANEL
Anion gap: 9 (ref 5–15)
BUN: 54 mg/dL — ABNORMAL HIGH (ref 8–23)
CO2: 27 mmol/L (ref 22–32)
Calcium: 7.5 mg/dL — ABNORMAL LOW (ref 8.9–10.3)
Chloride: 102 mmol/L (ref 98–111)
Creatinine, Ser: 6.81 mg/dL — ABNORMAL HIGH (ref 0.44–1.00)
GFR, Estimated: 6 mL/min — ABNORMAL LOW (ref >60)
Glucose, Bld: 98 mg/dL (ref 70–99)
Potassium: 3.9 mmol/L (ref 3.5–5.1)
Sodium: 138 mmol/L (ref 135–145)

## 2021-12-18 LAB — GLUCOSE, CAPILLARY
Glucose-Capillary: 70 mg/dL (ref 70–99)
Glucose-Capillary: 98 mg/dL (ref 70–99)
Glucose-Capillary: 133 mg/dL — ABNORMAL HIGH (ref 70–99)
Glucose-Capillary: 215 mg/dL — ABNORMAL HIGH (ref 70–99)
Glucose-Capillary: 192 mg/dL — ABNORMAL HIGH (ref 70–99)

## 2021-12-18 LAB — PHOSPHORUS: Phosphorus: 5.2 mg/dL — ABNORMAL HIGH (ref 2.5–4.6)

## 2021-12-18 MED ORDER — DELFLEX-LC/2.5% DEXTROSE 394 MOSM/L IP SOLN
INTRAPERITONEAL | Status: DC
  Filled 2021-12-18 (×2): qty 3000

## 2021-12-18 MED ORDER — HYDROCOD POLI-CHLORPHE POLI ER 10-8 MG/5ML PO SUER
5.0000 mL | Freq: Two times a day (BID) | ORAL | Status: DC | PRN
  Administered 2021-12-18 (×2): 5 mL via ORAL
  Filled 2021-12-18 (×2): qty 5

## 2021-12-18 MED ORDER — BENZONATATE 100 MG PO CAPS
200.0000 mg | ORAL_CAPSULE | Freq: Three times a day (TID) | ORAL | Status: DC
  Administered 2021-12-18 – 2021-12-19 (×4): 200 mg via ORAL
  Filled 2021-12-18 (×4): qty 2

## 2021-12-18 NOTE — Evaluation (Signed)
Occupational Therapy Evaluation Patient Details Name: Felicia Thompson MRN: [id number] DOB: [birth year] Today's Date: 12/18/2021   History of Present Illness Felicia Thompson is a 76 y.o. Caucasian female with medical history significant for end-stage renal disease on peritoneal dialysis, hypertension, dyslipidemia and obstructive sleep apnea, who presented to the ER with acute onset of generalized weakness as well as cough with chest congestion and inability to expectorate as well as fever and chills.   Clinical Impression   Pt seen for OT evaluation this date. Pt was independent in all ADLs and functional mobility, living in a 2-story home with family. Pt currently presents with decreased balance and activity tolerance. Due to these functional impairments, pt requires SUPERVISION for seated LB dressing, SUPERVISION for functional mobility of short household distances without AD, SUPERVISION for toilet transfers/hygiene, and SUPERVISION for standing hand hygiene. Pt would benefit from additional skilled OT services to maximize return to PLOF and minimize risk of future falls, injury, caregiver burden, and readmission. Upon discharge, recommend no OT follow-up and intermittent supervision/assistance from family (of note, pt's daughter is an OT with prior home health work experience).   Recommendations for follow up therapy are one component of a multi-disciplinary discharge planning process, led by the attending physician.  Recommendations may be updated based on patient status, additional functional criteria and insurance authorization.   Follow Up Recommendations  No OT follow up    Assistance Recommended at Discharge Intermittent Supervision/Assistance  Patient can return home with the following A little help with bathing/dressing/bathroom;Assistance with cooking/housework;Help with stairs or ramp for entrance    Functional Status Assessment  Patient has had a recent decline in  their functional status and demonstrates the ability to make significant improvements in function in a reasonable and predictable amount of time.  Equipment Recommendations  None recommended by OT       Precautions / Restrictions Precautions Precautions: Fall Restrictions Weight Bearing Restrictions: No      Mobility Bed Mobility               General bed mobility comments: In recliner pre/post session    Transfers Overall transfer level: Needs assistance Equipment used: None Transfers: Sit to/from Stand Sit to Stand: Supervision                  Balance Overall balance assessment: Mild deficits observed, not formally tested                                         ADL either performed or assessed with clinical judgement   ADL Overall ADL's : Needs assistance/impaired     Grooming: Wash/dry hands;Supervision/safety;Standing               Lower Body Dressing: Supervision/safety;Sitting/lateral leans Lower Body Dressing Details (indicate cue type and reason): to don/doff socks Toilet Transfer: Supervision/safety;Regular Toilet;Grab bars   Toileting- Clothing Manipulation and Hygiene: Supervision/safety;Sitting/lateral lean       Functional mobility during ADLs: Supervision/safety       Vision Baseline Vision/History: 1 Wears glasses Ability to See in Adequate Light: 0 Adequate Patient Visual Report: No change from baseline              Pertinent Vitals/Pain Pain Assessment Pain Assessment: No/denies pain        Extremity/Trunk Assessment Upper Extremity Assessment Upper Extremity Assessment: Overall WFL for tasks assessed   Lower Extremity Assessment  Lower Extremity Assessment: Generalized weakness   Cervical / Trunk Assessment Cervical / Trunk Assessment: Normal   Communication Communication Communication: No difficulties   Cognition Arousal/Alertness: Awake/alert Behavior During Therapy: WFL for tasks  assessed/performed Overall Cognitive Status: Within Functional Limits for tasks assessed                                                  Home Living Family/patient expects to be discharged to:: Private residence Living Arrangements: Children Available Help at Discharge: Family;Available 24 hours/day (daughter is an OT, son-law works from home and can be there 24/7 if needed) Type of Home: House Home Access: Stairs to enter [employer] of Steps: 5 Entrance Stairs-Rails: Can reach both Home Layout: Two level;Bed/bath upstairs Alternate Level Stairs-Number of Steps: 15 Alternate Level Stairs-Rails: Right Bathroom Shower/Tub: Teacher, early years/pre: Standard     Home Equipment: Rollator (4 wheels);Tub bench;Hand held shower head   Additional Comments: elevated toilet riser      Prior Functioning/Environment Prior Level of Function : Independent/Modified Independent             Mobility Comments: Independent for functional mobility without AD. Hx of orthostatic hypotension; denies any dizziness spells since previous admission in November ADLs Comments: Independent with ADLs. Family assists with IADLs.        OT Problem List: Decreased activity tolerance;Impaired balance (sitting and/or standing)      OT Treatment/Interventions: Self-care/ADL training;Therapeutic exercise;Energy conservation;Therapeutic activities;Patient/family education;Balance training    OT Goals(Current goals can be found in the care plan section) Acute Rehab OT Goals Patient Stated Goal: to have less fatigue OT Goal Formulation: With patient Time For Goal Achievement: 01/01/22 Potential to Achieve Goals: Good ADL Goals Pt Will Perform Grooming: with modified independence;standing Pt Will Perform Lower Body Dressing: with modified independence;sit to/from stand Pt Will Transfer to Toilet: with modified independence;ambulating;regular height toilet  OT  Frequency: Min 2X/week       AM-PAC OT "6 Clicks" Daily Activity     Outcome Measure Help from another person eating meals?: None Help from another person taking care of personal grooming?: None Help from another person toileting, which includes using toliet, bedpan, or urinal?: A Little Help from another person bathing (including washing, rinsing, drying)?: A Little Help from another person to put on and taking off regular upper body clothing?: None Help from another person to put on and taking off regular lower body clothing?: A Little 6 Click Score: 21   End of Session Nurse Communication: Mobility status  Activity Tolerance: Patient tolerated treatment well Patient left: in chair;with call bell/phone within reach;with chair alarm set  OT Visit Diagnosis: Unsteadiness on feet (R26.81)                Time: 1027-1050 OT Time Calculation (min): 23 min Charges:  OT General Charges $OT Visit: 1 Visit OT Evaluation $OT Eval Moderate Complexity: 1 Mod OT Treatments $Self Care/Home Management : 8-22 mins  Fredirick Maudlin, OTR/L [place]

## 2021-12-18 NOTE — Progress Notes (Signed)
Per patient, peritoneal dialysis treatment was tolerated last night with no issues. Disconnected from cycler without complications. PD line clamped/capped and secured to abdomen. No UF removed. Effluent clear/yellow with no fibrin noted.

## 2021-12-18 NOTE — Evaluation (Signed)
Physical Therapy Evaluation Patient Details Name: Felicia Thompson MRN: [id number] DOB: [birth year] Today's Date: 12/18/2021  History of Present Illness  Felicia Thompson is a 76 y.o. Caucasian female with medical history significant for end-stage renal disease on peritoneal dialysis, hypertension, dyslipidemia and obstructive sleep apnea, who presented to the ER with acute onset of generalized weakness as well as cough with chest congestion and inability to expectorate as well as fever and chills.   Clinical Impression  Pt admitted with above diagnosis. Pt received supine in bed agreeable to PT session. Pt able to report PLOF, DME, home lay out without difficulty. Pt able to transfer to EOB with HOB elevated with supervision and stand with no AD denying dizziness with transfers. Able to ambulate ~180' with no AD. Pt did display initial LOB walking in hallway with quick head turn leading to stepping strategy to correct LOB but did not require physical assist to correct. Pt slow and cautious gait with reciprocal step through pattern with no LOB after initial bout. Stair training deferred due to elevated systolic BP as pt has full staircase for access to bedroom and bathroom. Pt display minor SOB post ambulation but SPO2 remains >90% on RA. Education on PLB provided and rest breaks as needed. Anticipate currently pt strong enough to asc/desc 4-5 stairs to safely enter/exit home. Pt will benefit from [facility] PT at d/c due to LE weakness/endurance and minor balance deficits to return to PLOF. Pt currently with functional limitations due to the deficits listed below (see PT Problem List). Pt will benefit from skilled PT to increase their independence and safety with mobility to allow discharge to the venue listed below.      Recommendations for follow up therapy are one component of a multi-disciplinary discharge planning process, led by the attending physician.  Recommendations may be updated based on  patient status, additional functional criteria and insurance authorization.  Follow Up Recommendations Home health PT    Assistance Recommended at Discharge Intermittent Supervision/Assistance  Patient can return home with the following  A little help with walking and/or transfers;Help with stairs or ramp for entrance;Assist for transportation    Equipment Recommendations None recommended by PT  Recommendations for Other Services       Functional Status Assessment Patient has had a recent decline in their functional status and demonstrates the ability to make significant improvements in function in a reasonable and predictable amount of time.     Precautions / Restrictions Precautions Precautions: Fall Restrictions Weight Bearing Restrictions: No      Mobility  Bed Mobility Overal bed mobility: Needs Assistance Bed Mobility: Supine to Sit     Supine to sit: Supervision, HOB elevated     General bed mobility comments: In recliner post session Patient Response: Cooperative  Transfers Overall transfer level: Needs assistance Equipment used: None Transfers: Sit to/from Stand Sit to Stand: Supervision           General transfer comment: Safe hand placement    Ambulation/Gait Ambulation/Gait assistance: Supervision Gait Distance (Feet): 180 Feet Assistive device: None Gait Pattern/deviations: Step-through pattern       General Gait Details: Initial LOB with head turn ambualting in hallway with LOB with stepping strategy to correct. Does not require physical assist.  Stairs            Wheelchair Mobility    Modified Rankin (Stroke Patients Only)       Balance  Pertinent Vitals/Pain Pain Assessment Pain Assessment: No/denies pain    Home Living Family/patient expects to be discharged to:: Private residence Living Arrangements: Children Available Help at Discharge: Family;Available  PRN/intermittently Type of Home: House Home Access: Stairs to enter Entrance Stairs-Rails: Can reach both Entrance Stairs-Number of Steps: 5 Alternate Level Stairs-Number of Steps: 15 Home Layout: Two level;Bed/bath upstairs Home Equipment: Rollator (4 wheels);Tub bench Additional Comments: elevated toilet riser    Prior Function Prior Level of Function : Independent/Modified Independent             Mobility Comments: Denies any dizziness spells since previous admission in November       Hand Dominance        Extremity/Trunk Assessment   Upper Extremity Assessment Upper Extremity Assessment: Overall WFL for tasks assessed    Lower Extremity Assessment Lower Extremity Assessment: Generalized weakness    Cervical / Trunk Assessment Cervical / Trunk Assessment: Normal  Communication   Communication: No difficulties  Cognition Arousal/Alertness: Awake/alert Behavior During Therapy: WFL for tasks assessed/performed Overall Cognitive Status: Within Functional Limits for tasks assessed                                          General Comments      Exercises Other Exercises Other Exercises: Role of PT in acute setting, D/c recs   Assessment/Plan    PT Assessment Patient needs continued PT services  PT Problem List Decreased strength;Decreased activity tolerance       PT Treatment Interventions DME instruction;Therapeutic exercise;Gait training;Balance training;Stair training;Neuromuscular re-education;Functional mobility training;Therapeutic activities;Patient/family education    PT Goals (Current goals can be found in the Care Plan section)  Acute Rehab PT Goals Patient Stated Goal: to return home PT Goal Formulation: With patient Time For Goal Achievement: 01/01/22 Potential to Achieve Goals: Good    Frequency Min 2X/week     Co-evaluation               AM-PAC PT "6 Clicks" Mobility  Outcome Measure Help needed turning from  your back to your side while in a flat bed without using bedrails?: A Little Help needed moving from lying on your back to sitting on the side of a flat bed without using bedrails?: A Little Help needed moving to and from a bed to a chair (including a wheelchair)?: None Help needed standing up from a chair using your arms (e.g., wheelchair or bedside chair)?: A Little Help needed to walk in hospital room?: None Help needed climbing 3-5 steps with a railing? : A Little 6 Click Score: 20    End of Session Equipment Utilized During Treatment: Gait belt Activity Tolerance: Patient tolerated treatment well Patient left: in chair;with call bell/phone within reach;with chair alarm set Nurse Communication: Mobility status PT Visit Diagnosis: Unsteadiness on feet (R26.81);Muscle weakness (generalized) (M62.81)    Time: 8937-3428 PT Time Calculation (min) (ACUTE ONLY): 22 min   Charges:   PT Evaluation $PT Eval Low Complexity: Hawley M. Fairly IV, PT, DPT Physical Therapist- [facility]  12/18/2021, 11:58 AM

## 2021-12-18 NOTE — Progress Notes (Signed)
PROGRESS NOTE    Felicia Thompson  VPX:[id number] DOB: 02/11/1946 DOA: 12/16/2021 PCP: Isaac Bliss, Rayford Halsted, MD    Brief Narrative:  76 year old female history significant for hypertension, sleep apnea, dyslipidemia, ESRD on peritoneal dialysis who presents to the ED with progressive weakness associate with cough and shortness of breath and chest congestion.  On presentation patient meets sepsis criteria with likely source pneumonia.  Started on empiric CAP treatment.  Nephrology engaged for peritoneal dialysis.  Patient will resume PD in hospital  Has remained hemodynamically stable with significant cough and limitations in ambulation secondary to shortness of breath.  Remains on room air   Assessment & Plan:   Principal Problem:   Sepsis due to pneumonia ([place])  Right basal community-acquired pneumonia Sepsis secondary to above Sepsis criteria with fever and tachypnea Has remained hemodynamically stable on room air Deconditioned and weak with associated cough Improving over interval Plan: Continue IV Rocephin Continue azithromycin Follow blood cultures, no growth to date Mucolytic's and bronchodilators Antitussives Anticipate discharge 1/18  Acute hypoxic respiratory failure secondary to above Initially saturating in the 80s requiring 2 L nasal cannula Now weaned off  Hyperlipidemia PTA statin  Essential hypertension PTA Coreg and Cozaar  ESRD on PD Nephrology following for inpatient PD needs  Type 2 diabetes mellitus Hold oral agents Supplemental coverage with Semglee and NovoLog Car modified diet  Depression PTA Effexor  OSA Nightly CPAP    DVT prophylaxis: SQ heparin Code Status: Full Family Communication: Daughter Narda Rutherford [phone] on 1/17 Disposition Plan: Status is: Inpatient  Remains inpatient appropriate because: Community-acquired pneumonia.  Anticipate discharge 1/18       Level of care: Telemetry Medical  Consultants:   Nephrology  Procedures:  None  Antimicrobials: Rocephin Azithromycin   Subjective: Seen and examined.  Endorses cough.  No pain complaints.  Objective: Vitals:   12/18/21 0441 12/18/21 0526 12/18/21 0808 12/18/21 1145  BP: (!) 181/80 (!) 163/68 (!) 191/79 (!) 179/75  Pulse: 78 77 79 79  Resp: 20 18 17 15   Temp: 98.7 F (37.1 C)  98 F (36.7 C) 98.5 F (36.9 C)  TempSrc: Oral  Oral Oral  SpO2: 95% 100% 97% 94%  Weight:        Intake/Output Summary (Last 24 hours) at 12/18/2021 1230 Last data filed at 12/18/2021 0759 Gross per 24 hour  Intake 8000 ml  Output 7944 ml  Net 56 ml   Filed Weights   12/16/21 2034 12/17/21 1850  Weight: 83.9 kg 88.8 kg    Examination:  General exam: Appears calm and comfortable  Respiratory system: Coarse breath sounds.  Normal work of breathing.  Right base crackles.  Room air Cardiovascular system: S1-S2, RRR, no murmurs, no pedal edema Gastrointestinal system: Abdomen is nondistended, soft and nontender. No organomegaly or masses felt. Normal bowel sounds heard. Central nervous system: Alert and oriented. No focal neurological deficits. Extremities: Symmetric 5 x 5 power. Skin: No rashes, lesions or ulcers Psychiatry: Judgement and insight appear normal. Mood & affect appropriate.     Data Reviewed: I have personally reviewed following labs and imaging studies  CBC: Recent Labs  Lab 12/16/21 2045 12/17/21 0607  WBC 10.8* 8.7  NEUTROABS 8.3*  --   HGB 9.8* 8.3*  HCT 28.2* 23.5*  MCV 91.9 91.1  PLT 165 381*   Basic Metabolic Panel: Recent Labs  Lab 12/16/21 2045 12/17/21 0607 12/18/21 0602  NA 137 138 138  K 4.4 4.6 3.9  CL 101 103 102  CO2  29 25 27   GLUCOSE 154* 155* 98  BUN 58* 61* 54*  CREATININE 6.93* 7.19* 6.81*  CALCIUM 8.0* 7.3* 7.5*  PHOS  --   --  5.2*   GFR: Estimated Creatinine Clearance: 8.4 mL/min (A) (by C-G formula based on SCr of 6.81 mg/dL (H)). Liver Function Tests: Recent Labs  Lab  12/16/21 2045  AST 34  ALT 38  ALKPHOS 52  BILITOT 0.7  PROT 5.8*  ALBUMIN 2.9*   Recent Labs  Lab 12/16/21 2045  LIPASE 53*   No results for input(s): AMMONIA in the last 168 hours. Coagulation Profile: Recent Labs  Lab 12/16/21 2334 12/17/21 0607  INR 1.1 1.1   Cardiac Enzymes: No results for input(s): CKTOTAL, CKMB, CKMBINDEX, TROPONINI in the last 168 hours. BNP (last 3 results) No results for input(s): PROBNP in the last 8760 hours. HbA1C: Recent Labs    12/17/21 0607  HGBA1C 6.8*   CBG: Recent Labs  Lab 12/17/21 1428 12/17/21 1653 12/17/21 2046 12/18/21 0806 12/18/21 1144  GLUCAP 67* 70 153* 98 133*   Lipid Profile: No results for input(s): CHOL, HDL, LDLCALC, TRIG, CHOLHDL, LDLDIRECT in the last 72 hours. Thyroid Function Tests: No results for input(s): TSH, T4TOTAL, FREET4, T3FREE, THYROIDAB in the last 72 hours. Anemia Panel: No results for input(s): VITAMINB12, FOLATE, FERRITIN, TIBC, IRON, RETICCTPCT in the last 72 hours. Sepsis Labs: Recent Labs  Lab 12/16/21 2334 12/16/21 2339 12/17/21 0136 12/17/21 0607  PROCALCITON 0.17  --   --  0.17  LATICACIDVEN  --  1.3 1.1  --     Recent Results (from the past 240 hour(s))  Resp Panel by RT-PCR (Flu A&B, Covid) Nasopharyngeal Swab     Status: None   Collection Time: 12/16/21  8:35 PM   Specimen: Nasopharyngeal Swab; Nasopharyngeal(NP) swabs in vial transport medium  Result Value Ref Range Status   SARS Coronavirus 2 by RT PCR NEGATIVE NEGATIVE Final    Comment: (NOTE) SARS-CoV-2 target nucleic acids are NOT DETECTED.  The SARS-CoV-2 RNA is generally detectable in upper respiratory specimens during the acute phase of infection. The lowest concentration of SARS-CoV-2 viral copies this assay can detect is 138 copies/mL. A negative result does not preclude SARS-Cov-2 infection and should not be used as the sole basis for treatment or other patient management decisions. A negative result may  occur with  improper specimen collection/handling, submission of specimen other than nasopharyngeal swab, presence of viral mutation(s) within the areas targeted by this assay, and inadequate number of viral copies(<138 copies/mL). A negative result must be combined with clinical observations, patient history, and epidemiological information. The expected result is Negative.  Fact Sheet for Patients:  EntrepreneurPulse.com.au  Fact Sheet for Healthcare Providers:  IncredibleEmployment.be  This test is no t yet approved or cleared by the [place] FDA and  has been authorized for detection and/or diagnosis of SARS-CoV-2 by FDA under an Emergency Use Authorization (EUA). This EUA will remain  in effect (meaning this test can be used) for the duration of the COVID-19 declaration under Section 564(b)(1) of the Act, 21 U.S.C.section 360bbb-3(b)(1), unless the authorization is terminated  or revoked sooner.       Influenza A by PCR NEGATIVE NEGATIVE Final   Influenza B by PCR NEGATIVE NEGATIVE Final    Comment: (NOTE) The Xpert Xpress SARS-CoV-2/FLU/RSV plus assay is intended as an aid in the diagnosis of influenza from Nasopharyngeal swab specimens and should not be used as a sole basis for treatment. Nasal washings  and aspirates are unacceptable for Xpert Xpress SARS-CoV-2/FLU/RSV testing.  Fact Sheet for Patients: EntrepreneurPulse.com.au  Fact Sheet for Healthcare Providers: IncredibleEmployment.be  This test is not yet approved or cleared by the [place] FDA and has been authorized for detection and/or diagnosis of SARS-CoV-2 by FDA under an Emergency Use Authorization (EUA). This EUA will remain in effect (meaning this test can be used) for the duration of the COVID-19 declaration under Section 564(b)(1) of the Act, 21 U.S.C. section 360bbb-3(b)(1), unless the authorization is terminated  or revoked.  Performed at [facility], [address] [place], [place] [address]   Blood culture (routine x 2)     Status: None (Preliminary result)   Collection Time: 12/16/21  8:57 PM   Specimen: BLOOD  Result Value Ref Range Status   Specimen Description BLOOD LEFT ANTECUBITAL  Final   Special Requests   Final    BOTTLES DRAWN AEROBIC AND ANAEROBIC Blood Culture adequate volume   Culture   Final    NO GROWTH 2 DAYS Performed at [facility], [address] [place], [place] [address]    Report Status PENDING  Incomplete  Blood culture (routine x 2)     Status: None (Preliminary result)   Collection Time: 12/16/21 11:34 PM   Specimen: BLOOD  Result Value Ref Range Status   Specimen Description BLOOD RIGHT ANTECUBITAL  Final   Special Requests   Final    BOTTLES DRAWN AEROBIC AND ANAEROBIC Blood Culture adequate volume   Culture   Final    NO GROWTH 1 DAY Performed at [facility], [address], [place], [place] [address]    Report Status PENDING  Incomplete         Radiology Studies: DG Chest 2 View  Result Date: 12/16/2021 CLINICAL DATA:  Cough and fever EXAM: CHEST - 2 VIEW COMPARISON:  None. FINDINGS: The heart size and mediastinal contours are within normal limits. Bibasilar atelectasis. Otherwise both lungs are clear. The visualized skeletal structures are unremarkable. IMPRESSION: Bibasilar atelectasis. Electronically Signed   By: Ulyses Jarred M.D.   On: 12/16/2021 21:21        Scheduled Meds:  acidophilus  1 capsule Oral QHS   aspirin  81 mg Oral Daily   atorvastatin  80 mg Oral Daily   benzonatate  200 mg Oral TID   calcium carbonate  1 tablet Oral BID WC   carvedilol  25 mg Oral BID WC   gentamicin cream  1 application Topical Daily   guaiFENesin  600 mg Oral BID   heparin injection (subcutaneous)  5,000 Units Subcutaneous Q8H   insulin aspart  0-15 Units Subcutaneous TID WC   insulin aspart  0-5 Units Subcutaneous  QHS   insulin aspart protamine- aspart  10 Units Subcutaneous Q breakfast   losartan  50 mg Oral Daily   multivitamin  1 tablet Oral Daily   sevelamer carbonate  2,400 mg Oral TID WC   venlafaxine XR  150 mg Oral Q breakfast   Continuous Infusions:  azithromycin 500 mg (12/17/21 2103)   cefTRIAXone (ROCEPHIN)  IV Stopped (12/17/21 1222)   dialysis solution 1.5% low-MG/low-CA     dialysis solution 2.5% low-MG/low-CA       LOS: 1 day    Time spent: 35 minutes    Sidney Ace, MD [facility]   If 7PM-7AM, please contact night-coverage  12/18/2021, 12:30 PM

## 2021-12-18 NOTE — Progress Notes (Signed)
[facility]  ROUNDING NOTE   Subjective:   Felicia Thompson is a 76 y.o. female with past medical history of hypertension, sleep apnea, dyslipidemia, and end-stage renal disease on peritoneal dialysis.  Patient reports to the emergency room with progressive weakness foot and weakness with cough and congestion.  Patient has been admitted for Acute respiratory failure with hypoxia ([place]) [J96.01] Sepsis due to pneumonia ([place]) [J18.9, A41.9] Community acquired pneumonia, unspecified laterality [J18.9] Sepsis, due to unspecified organism, unspecified whether acute organ dysfunction present [facility]) [A41.9]  Patient is known to our clinic and receives outpatient peritoneal dialysis treatment under the supervision of Dr. Juleen China at [facility].    Patient seen sitting up in bed Alert and oriented Cough remains, nonproductive  PD treatment tolerated well overnight  Objective:  Vital signs in last 24 hours:  Temp:  [98 F (36.7 C)-99.1 F (37.3 C)] 98 F (36.7 C) (01/17 0808) Pulse Rate:  [75-90] 79 (01/17 0808) Resp:  [17-25] 17 (01/17 0808) BP: (150-197)/(60-85) 191/79 (01/17 0808) SpO2:  [93 %-100 %] 97 % (01/17 0808) Weight:  [88.8 kg] 88.8 kg (01/16 1850)  Weight change: 4.885 kg Filed Weights   12/16/21 2034 12/17/21 1850  Weight: 83.9 kg 88.8 kg    Intake/Output: I/O last 3 completed shifts: In: 350 [IV Piggyback:350] Out: -    Intake/Output this shift:  Total I/O In: 8000 [Other:8000] Out: 7944 [EXBMW:4132]  Physical Exam: General: NAD, sitting in chair  Head: Normocephalic, atraumatic. Moist oral mucosal membranes  Eyes: Anicteric  Lungs:  Basilar rales, normal effort, room air, nonproductive cough  Heart: Regular rate and rhythm  Abdomen:  Soft, nontender  Extremities: no peripheral edema.  Neurologic: Alert, moving all four extremities  Skin: No lesions  Access: PD catheter    Basic Metabolic Panel: Recent Labs  Lab  12/16/21 2045 12/17/21 0607 12/18/21 0602  NA 137 138 138  K 4.4 4.6 3.9  CL 101 103 102  CO2 29 25 27   GLUCOSE 154* 155* 98  BUN 58* 61* 54*  CREATININE 6.93* 7.19* 6.81*  CALCIUM 8.0* 7.3* 7.5*  PHOS  --   --  5.2*     Liver Function Tests: Recent Labs  Lab 12/16/21 2045  AST 34  ALT 38  ALKPHOS 52  BILITOT 0.7  PROT 5.8*  ALBUMIN 2.9*    Recent Labs  Lab 12/16/21 2045  LIPASE 53*    No results for input(s): AMMONIA in the last 168 hours.  CBC: Recent Labs  Lab 12/16/21 2045 12/17/21 0607  WBC 10.8* 8.7  NEUTROABS 8.3*  --   HGB 9.8* 8.3*  HCT 28.2* 23.5*  MCV 91.9 91.1  PLT 165 139*     Cardiac Enzymes: No results for input(s): CKTOTAL, CKMB, CKMBINDEX, TROPONINI in the last 168 hours.  BNP: Invalid input(s): POCBNP  CBG: Recent Labs  Lab 12/17/21 1150 12/17/21 1428 12/17/21 1653 12/17/21 2046 12/18/21 0806  GLUCAP 77 67* 70 153* 98     Microbiology: Results for orders placed or performed during the hospital encounter of 12/16/21  Resp Panel by RT-PCR (Flu A&B, Covid) Nasopharyngeal Swab     Status: None   Collection Time: 12/16/21  8:35 PM   Specimen: Nasopharyngeal Swab; Nasopharyngeal(NP) swabs in vial transport medium  Result Value Ref Range Status   SARS Coronavirus 2 by RT PCR NEGATIVE NEGATIVE Final    Comment: (NOTE) SARS-CoV-2 target nucleic acids are NOT DETECTED.  The SARS-CoV-2 RNA is generally detectable in upper respiratory  specimens during the acute phase of infection. The lowest concentration of SARS-CoV-2 viral copies this assay can detect is 138 copies/mL. A negative result does not preclude SARS-Cov-2 infection and should not be used as the sole basis for treatment or other patient management decisions. A negative result may occur with  improper specimen collection/handling, submission of specimen other than nasopharyngeal swab, presence of viral mutation(s) within the areas targeted by this assay, and  inadequate number of viral copies(<138 copies/mL). A negative result must be combined with clinical observations, patient history, and epidemiological information. The expected result is Negative.  Fact Sheet for Patients:  EntrepreneurPulse.com.au  Fact Sheet for Healthcare Providers:  IncredibleEmployment.be  This test is no t yet approved or cleared by the [place] FDA and  has been authorized for detection and/or diagnosis of SARS-CoV-2 by FDA under an Emergency Use Authorization (EUA). This EUA will remain  in effect (meaning this test can be used) for the duration of the COVID-19 declaration under Section 564(b)(1) of the Act, 21 U.S.C.section 360bbb-3(b)(1), unless the authorization is terminated  or revoked sooner.       Influenza A by PCR NEGATIVE NEGATIVE Final   Influenza B by PCR NEGATIVE NEGATIVE Final    Comment: (NOTE) The Xpert Xpress SARS-CoV-2/FLU/RSV plus assay is intended as an aid in the diagnosis of influenza from Nasopharyngeal swab specimens and should not be used as a sole basis for treatment. Nasal washings and aspirates are unacceptable for Xpert Xpress SARS-CoV-2/FLU/RSV testing.  Fact Sheet for Patients: EntrepreneurPulse.com.au  Fact Sheet for Healthcare Providers: IncredibleEmployment.be  This test is not yet approved or cleared by the [place] FDA and has been authorized for detection and/or diagnosis of SARS-CoV-2 by FDA under an Emergency Use Authorization (EUA). This EUA will remain in effect (meaning this test can be used) for the duration of the COVID-19 declaration under Section 564(b)(1) of the Act, 21 U.S.C. section 360bbb-3(b)(1), unless the authorization is terminated or revoked.  Performed at [facility], [address] [place], [place] [address]   Blood culture (routine x 2)     Status: None (Preliminary result)   Collection Time:  12/16/21  8:57 PM   Specimen: BLOOD  Result Value Ref Range Status   Specimen Description BLOOD LEFT ANTECUBITAL  Final   Special Requests   Final    BOTTLES DRAWN AEROBIC AND ANAEROBIC Blood Culture adequate volume   Culture   Final    NO GROWTH 2 DAYS Performed at [facility], [address], [place], [place] [address]    Report Status PENDING  Incomplete  Blood culture (routine x 2)     Status: None (Preliminary result)   Collection Time: 12/16/21 11:34 PM   Specimen: BLOOD  Result Value Ref Range Status   Specimen Description BLOOD RIGHT ANTECUBITAL  Final   Special Requests   Final    BOTTLES DRAWN AEROBIC AND ANAEROBIC Blood Culture adequate volume   Culture   Final    NO GROWTH 1 DAY Performed at [facility], [address], [place], [place] [address]    Report Status PENDING  Incomplete    Coagulation Studies: Recent Labs    12/16/21 2334 12/17/21 0607  LABPROT 13.8 14.2  INR 1.1 1.1     Urinalysis: Recent Labs    12/17/21 0031  COLORURINE YELLOW*  LABSPEC 1.025  PHURINE 6.0  GLUCOSEU NEGATIVE  HGBUR TRACE*  BILIRUBINUR NEGATIVE  KETONESUR NEGATIVE  PROTEINUR >300*  NITRITE NEGATIVE  LEUKOCYTESUR  TRACE*       Imaging: DG Chest 2 View  Result Date: 12/16/2021 CLINICAL DATA:  Cough and fever EXAM: CHEST - 2 VIEW COMPARISON:  None. FINDINGS: The heart size and mediastinal contours are within normal limits. Bibasilar atelectasis. Otherwise both lungs are clear. The visualized skeletal structures are unremarkable. IMPRESSION: Bibasilar atelectasis. Electronically Signed   By: Ulyses Jarred M.D.   On: 12/16/2021 21:21     Medications:    azithromycin 500 mg (12/17/21 2103)   cefTRIAXone (ROCEPHIN)  IV Stopped (12/17/21 1222)   dialysis solution 1.5% low-MG/low-CA     dialysis solution 2.5% low-MG/low-CA      acidophilus  1 capsule Oral QHS   aspirin  81 mg Oral Daily   atorvastatin  80 mg Oral Daily   benzonatate  200 mg  Oral TID   calcium carbonate  1 tablet Oral BID WC   carvedilol  25 mg Oral BID WC   gentamicin cream  1 application Topical Daily   guaiFENesin  600 mg Oral BID   heparin injection (subcutaneous)  5,000 Units Subcutaneous Q8H   insulin aspart  0-15 Units Subcutaneous TID WC   insulin aspart  0-5 Units Subcutaneous QHS   insulin aspart protamine- aspart  10 Units Subcutaneous Q breakfast   losartan  50 mg Oral Daily   multivitamin  1 tablet Oral Daily   sevelamer carbonate  2,400 mg Oral TID WC   venlafaxine XR  150 mg Oral Q breakfast   acetaminophen **OR** acetaminophen, benzonatate, chlorpheniramine-HYDROcodone, heparin, ipratropium-albuterol, magnesium hydroxide, ondansetron **OR** ondansetron (ZOFRAN) IV, traZODone  Assessment/ Plan:  Felicia Thompson is a 76 y.o.  female with past medical history of hypertension, sleep apnea, dyslipidemia, and end-stage renal disease on peritoneal dialysis.  Patient reports to the emergency room with progressive weakness foot and weakness with cough and congestion.  Patient has been admitted for Acute respiratory failure with hypoxia ([place]) [J96.01] Sepsis due to pneumonia ([place]) [J18.9, A41.9] Community acquired pneumonia, unspecified laterality [J18.9] Sepsis, due to unspecified organism, unspecified whether acute organ dysfunction present ([place]) [A41.9]   [facility] PD [place] 81 kg PD- 2L fills, 4 cycles, 8 hrs, 10/20/no last fill  End-stage renal disease on peritoneal dialysis.  We will continue nightly treatments during this admission.  Successful treatment overnight, will continue  2. Anemia of chronic kidney disease   Lab Results  Component Value Date   HGB 8.3 (L) 12/17/2021    Hemoglobin below target. Mircera received outpatient. Will continue to monitor  3. Secondary Hyperparathyroidism: with outpatient labs: PTH 188, phosphorus 4.1, calcium 7.8 on 11/20/21.   Lab Results  Component Value Date   CALCIUM 7.5 (L)  12/18/2021   PHOS 5.2 (H) 12/18/2021  Continue calcium carbonate and Renvela with meals outpatient.  4.  Hypertension with chronic kidney disease.  Home regimen includes carvedilol and losartan.  Currently receiving both this admission. BP 191/79  5. 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 Home regimen includes glipizide and NPH. Most recent hemoglobin A1c is 5.5 on 11/20/21.   6.  Community-acquired pneumonia, right basilar.  Antibiotics include Rocephin and azithromycin, along with supportive care therapies.     LOS: 1 Earvin Blazier 1/17/202311:01 AM

## 2021-12-19 DIAGNOSIS — N186 End stage renal disease: Secondary | ICD-10-CM | POA: Diagnosis not present

## 2021-12-19 DIAGNOSIS — N2581 Secondary hyperparathyroidism of renal origin: Secondary | ICD-10-CM | POA: Diagnosis not present

## 2021-12-19 DIAGNOSIS — D631 Anemia in chronic kidney disease: Secondary | ICD-10-CM | POA: Diagnosis not present

## 2021-12-19 DIAGNOSIS — Z992 Dependence on renal dialysis: Secondary | ICD-10-CM | POA: Diagnosis not present

## 2021-12-19 DIAGNOSIS — E875 Hyperkalemia: Secondary | ICD-10-CM | POA: Diagnosis not present

## 2021-12-19 DIAGNOSIS — Z79899 Other long term (current) drug therapy: Secondary | ICD-10-CM | POA: Diagnosis not present

## 2021-12-19 DIAGNOSIS — R82998 Other abnormal findings in urine: Secondary | ICD-10-CM | POA: Diagnosis not present

## 2021-12-19 DIAGNOSIS — D509 Iron deficiency anemia, unspecified: Secondary | ICD-10-CM | POA: Diagnosis not present

## 2021-12-19 DIAGNOSIS — J9601 Acute respiratory failure with hypoxia: Secondary | ICD-10-CM

## 2021-12-19 LAB — GLUCOSE, CAPILLARY
Glucose-Capillary: 99 mg/dL (ref 70–99)
Glucose-Capillary: 118 mg/dL — ABNORMAL HIGH (ref 70–99)

## 2021-12-19 MED ORDER — AMOXICILLIN-POT CLAVULANATE 500-125 MG PO TABS: 1.0000 | ORAL_TABLET | Freq: Two times a day (BID) | ORAL | 0 refills | Status: AC

## 2021-12-19 MED ORDER — AMOXICILLIN-POT CLAVULANATE 875-125 MG PO TABS: 1.0000 | ORAL_TABLET | Freq: Two times a day (BID) | ORAL | 0 refills | Status: DC

## 2021-12-19 MED ORDER — GUAIFENESIN ER 600 MG PO TB12: 600.0000 mg | ORAL_TABLET | Freq: Two times a day (BID) | ORAL | 0 refills | Status: AC

## 2021-12-19 NOTE — Progress Notes (Signed)
Physical Therapy Treatment Patient Details Name: Felicia Thompson MRN: [id number] DOB: [birth year] Today's Date: 12/19/2021   History of Present Illness Felicia Thompson is a 76 y.o. Caucasian female with medical history significant for end-stage renal disease on peritoneal dialysis, hypertension, dyslipidemia and obstructive sleep apnea, who presented to the ER with acute onset of generalized weakness as well as cough with chest congestion and inability to expectorate as well as fever and chills.    PT Comments    Pt received in recliner agreeable to PT services. Transported to stairs for stair training/assessment. Pt able to perform 12 stairs total asc/desc with step through pattern and use of R railing. Pt steady with no LoB only requiring supervision. Minor SOB appreciated with SPO2 remaining at 95% post stairs. Pt returned to room in recliner and progressed ambulation in hallway with practice on changes in gait speed with no LOB, did require minguard for changes in speed for safety. No LOB appreciated, able to change speeds on commands but with no significant increase/decrease. Pt returned to recliner with supervision with all needs in reach. Pt safe to d/c home with [facility] PT. Will continue to benefit to improve endurance, SOB, mild balance deficits to return to PLOF.    Recommendations for follow up therapy are one component of a multi-disciplinary discharge planning process, led by the attending physician.  Recommendations may be updated based on patient status, additional functional criteria and insurance authorization.  Follow Up Recommendations  Home health PT     Assistance Recommended at Discharge Intermittent Supervision/Assistance  Patient can return home with the following A little help with walking and/or transfers;Help with stairs or ramp for entrance;Assist for transportation   Equipment Recommendations  None recommended by PT    Recommendations for Other Services        Precautions / Restrictions Precautions Precautions: Fall Restrictions Weight Bearing Restrictions: No     Mobility  Bed Mobility               General bed mobility comments: In recliner pre/post session Patient Response: Cooperative  Transfers Overall transfer level: Needs assistance Equipment used: None Transfers: Sit to/from Stand Sit to Stand: Supervision           General transfer comment: Safe hand placement    Ambulation/Gait Ambulation/Gait assistance: Supervision, Min guard Gait Distance (Feet): 190 Feet Assistive device: None Gait Pattern/deviations: Step-through pattern           Stairs Stairs: Yes Stairs assistance: Supervision Stair Management: One rail Right, Alternating pattern, Forwards Number of Stairs: 12 General stair comments: Reciprocal step through pattern, slow and steady   Wheelchair Mobility    Modified Rankin (Stroke Patients Only)       Balance Overall balance assessment: Needs assistance Sitting-balance support: No upper extremity supported, Feet supported Sitting balance-Leahy Scale: Fair       Standing balance-Leahy Scale: Fair                              Cognition Arousal/Alertness: Awake/alert Behavior During Therapy: WFL for tasks assessed/performed Overall Cognitive Status: Within Functional Limits for tasks assessed                                          Exercises Other Exercises Other Exercises: Energy conservation, PLB    General Comments General comments (skin  integrity, edema, etc.): SPo2: 95% at rest and post mobility      Pertinent Vitals/Pain Pain Assessment Pain Assessment: No/denies pain    Home Living                          Prior Function            PT Goals (current goals can now be found in the care plan section) Acute Rehab PT Goals Patient Stated Goal: to return home PT Goal Formulation: With patient Time For Goal  Achievement: 01/01/22 Potential to Achieve Goals: Good Progress towards PT goals: Progressing toward goals    Frequency    Min 2X/week      PT Plan Current plan remains appropriate    Co-evaluation              AM-PAC PT "6 Clicks" Mobility   Outcome Measure  Help needed turning from your back to your side while in a flat bed without using bedrails?: A Little Help needed moving from lying on your back to sitting on the side of a flat bed without using bedrails?: A Little Help needed moving to and from a bed to a chair (including a wheelchair)?: None Help needed standing up from a chair using your arms (e.g., wheelchair or bedside chair)?: A Little Help needed to walk in hospital room?: None Help needed climbing 3-5 steps with a railing? : A Little 6 Click Score: 20    End of Session Equipment Utilized During Treatment: Gait belt Activity Tolerance: Patient tolerated treatment well Patient left: in chair;with call bell/phone within reach Nurse Communication: Mobility status PT Visit Diagnosis: Unsteadiness on feet (R26.81);Muscle weakness (generalized) (M62.81)     Time: 8921-1941 PT Time Calculation (min) (ACUTE ONLY): 18 min  Charges:  $Therapeutic Exercise: 8-22 mins                     Salem Caster. Fairly IV, PT, DPT Physical Therapist- [facility]  12/19/2021, 10:31 AM

## 2021-12-19 NOTE — Progress Notes (Addendum)
[facility]  ROUNDING NOTE   Subjective:   Felicia Thompson is a 76 y.o. female with past medical history of hypertension, sleep apnea, dyslipidemia, and end-stage renal disease on peritoneal dialysis.  Patient reports to the emergency room with progressive weakness foot and weakness with cough and congestion.  Patient has been admitted for Acute respiratory failure with hypoxia ([place]) [J96.01] Sepsis due to pneumonia ([place]) [J18.9, A41.9] Community acquired pneumonia, unspecified laterality [J18.9] Sepsis, due to unspecified organism, unspecified whether acute organ dysfunction present [facility]) [A41.9]  Patient is known to our clinic and receives outpatient peritoneal dialysis treatment under the supervision of Dr. Juleen China at [facility].    Patient seen sitting up in chair Recently completed session with PT involving stairs Appears well, nonproductive cough remains Currently on room air  PD went well overnight No edema  Objective:  Vital signs in last 24 hours:  Temp:  [97.9 F (36.6 C)-98.5 F (36.9 C)] 98 F (36.7 C) (01/18 1208) Pulse Rate:  [72-82] 77 (01/18 1208) Resp:  [16-18] 18 (01/18 1208) BP: (154-198)/(63-80) 192/76 (01/18 1208) SpO2:  [95 %-99 %] 97 % (01/18 1208) Weight:  [90.6 kg] 90.6 kg (01/17 1757)  Weight change: 1.8 kg Filed Weights   12/16/21 2034 12/17/21 1850 12/18/21 1757  Weight: 83.9 kg 88.8 kg 90.6 kg    Intake/Output: I/O last 3 completed shifts: In: 8480 [P.O.:480; Other:8000] Out: 7944 [FIEPP:2951]   Intake/Output this shift:  Total I/O In: 240 [P.O.:240] Out: -   Physical Exam: General: NAD, sitting in chair  Head: Normocephalic, atraumatic. Moist oral mucosal membranes  Eyes: Anicteric  Lungs:  Diminished on right basilar, normal effort, room air, nonproductive cough  Heart: Regular rate and rhythm  Abdomen:  Soft, nontender  Extremities: no peripheral edema.  Neurologic: Alert, moving all four  extremities  Skin: No lesions  Access: PD catheter    Basic Metabolic Panel: Recent Labs  Lab 12/16/21 2045 12/17/21 0607 12/18/21 0602  NA 137 138 138  K 4.4 4.6 3.9  CL 101 103 102  CO2 29 25 27   GLUCOSE 154* 155* 98  BUN 58* 61* 54*  CREATININE 6.93* 7.19* 6.81*  CALCIUM 8.0* 7.3* 7.5*  PHOS  --   --  5.2*     Liver Function Tests: Recent Labs  Lab 12/16/21 2045  AST 34  ALT 38  ALKPHOS 52  BILITOT 0.7  PROT 5.8*  ALBUMIN 2.9*    Recent Labs  Lab 12/16/21 2045  LIPASE 53*    No results for input(s): AMMONIA in the last 168 hours.  CBC: Recent Labs  Lab 12/16/21 2045 12/17/21 0607  WBC 10.8* 8.7  NEUTROABS 8.3*  --   HGB 9.8* 8.3*  HCT 28.2* 23.5*  MCV 91.9 91.1  PLT 165 139*     Cardiac Enzymes: No results for input(s): CKTOTAL, CKMB, CKMBINDEX, TROPONINI in the last 168 hours.  BNP: Invalid input(s): POCBNP  CBG: Recent Labs  Lab 12/18/21 0806 12/18/21 1144 12/18/21 1608 12/18/21 2115 12/19/21 0740  GLUCAP 98 133* 215* 192* 99     Microbiology: Results for orders placed or performed during the hospital encounter of 12/16/21  Resp Panel by RT-PCR (Flu A&B, Covid) Nasopharyngeal Swab     Status: None   Collection Time: 12/16/21  8:35 PM   Specimen: Nasopharyngeal Swab; Nasopharyngeal(NP) swabs in vial transport medium  Result Value Ref Range Status   SARS Coronavirus 2 by RT PCR NEGATIVE NEGATIVE Final    Comment: (NOTE)  SARS-CoV-2 target nucleic acids are NOT DETECTED.  The SARS-CoV-2 RNA is generally detectable in upper respiratory specimens during the acute phase of infection. The lowest concentration of SARS-CoV-2 viral copies this assay can detect is 138 copies/mL. A negative result does not preclude SARS-Cov-2 infection and should not be used as the sole basis for treatment or other patient management decisions. A negative result may occur with  improper specimen collection/handling, submission of specimen other than  nasopharyngeal swab, presence of viral mutation(s) within the areas targeted by this assay, and inadequate number of viral copies(<138 copies/mL). A negative result must be combined with clinical observations, patient history, and epidemiological information. The expected result is Negative.  Fact Sheet for Patients:  EntrepreneurPulse.com.au  Fact Sheet for Healthcare Providers:  IncredibleEmployment.be  This test is no t yet approved or cleared by the [place] FDA and  has been authorized for detection and/or diagnosis of SARS-CoV-2 by FDA under an Emergency Use Authorization (EUA). This EUA will remain  in effect (meaning this test can be used) for the duration of the COVID-19 declaration under Section 564(b)(1) of the Act, 21 U.S.C.section 360bbb-3(b)(1), unless the authorization is terminated  or revoked sooner.       Influenza A by PCR NEGATIVE NEGATIVE Final   Influenza B by PCR NEGATIVE NEGATIVE Final    Comment: (NOTE) The Xpert Xpress SARS-CoV-2/FLU/RSV plus assay is intended as an aid in the diagnosis of influenza from Nasopharyngeal swab specimens and should not be used as a sole basis for treatment. Nasal washings and aspirates are unacceptable for Xpert Xpress SARS-CoV-2/FLU/RSV testing.  Fact Sheet for Patients: EntrepreneurPulse.com.au  Fact Sheet for Healthcare Providers: IncredibleEmployment.be  This test is not yet approved or cleared by the [place] FDA and has been authorized for detection and/or diagnosis of SARS-CoV-2 by FDA under an Emergency Use Authorization (EUA). This EUA will remain in effect (meaning this test can be used) for the duration of the COVID-19 declaration under Section 564(b)(1) of the Act, 21 U.S.C. section 360bbb-3(b)(1), unless the authorization is terminated or revoked.  Performed at [facility], [address] [place], [place]  [address]   Blood culture (routine x 2)     Status: None (Preliminary result)   Collection Time: 12/16/21  8:57 PM   Specimen: BLOOD  Result Value Ref Range Status   Specimen Description BLOOD LEFT ANTECUBITAL  Final   Special Requests   Final    BOTTLES DRAWN AEROBIC AND ANAEROBIC Blood Culture adequate volume   Culture   Final    NO GROWTH 3 DAYS Performed at [facility], [address], [place], [place] [address]    Report Status PENDING  Incomplete  Blood culture (routine x 2)     Status: None (Preliminary result)   Collection Time: 12/16/21 11:34 PM   Specimen: BLOOD  Result Value Ref Range Status   Specimen Description BLOOD RIGHT ANTECUBITAL  Final   Special Requests   Final    BOTTLES DRAWN AEROBIC AND ANAEROBIC Blood Culture adequate volume   Culture   Final    NO GROWTH 2 DAYS Performed at [facility], [address], [place],  [address]    Report Status PENDING  Incomplete    Coagulation Studies: Recent Labs    12/16/21 2334 12/17/21 0607  LABPROT 13.8 14.2  INR 1.1 1.1     Urinalysis: Recent Labs    12/17/21 0031  COLORURINE YELLOW*  LABSPEC 1.025  PHURINE 6.0  GLUCOSEU NEGATIVE  HGBUR TRACE*  BILIRUBINUR NEGATIVE  KETONESUR NEGATIVE  PROTEINUR >300*  NITRITE NEGATIVE  LEUKOCYTESUR TRACE*       Imaging: No results found.   Medications:    azithromycin 500 mg (12/18/21 2140)   cefTRIAXone (ROCEPHIN)  IV 2 g (12/18/21 1444)   dialysis solution 2.5% low-MG/low-CA      acidophilus  1 capsule Oral QHS   aspirin  81 mg Oral Daily   atorvastatin  80 mg Oral Daily   benzonatate  200 mg Oral TID   calcium carbonate  1 tablet Oral BID WC   carvedilol  25 mg Oral BID WC   gentamicin cream  1 application Topical Daily   guaiFENesin  600 mg Oral BID   heparin injection (subcutaneous)  5,000 Units Subcutaneous Q8H   insulin aspart  0-15 Units Subcutaneous TID WC   insulin aspart  0-5 Units Subcutaneous QHS   insulin  aspart protamine- aspart  10 Units Subcutaneous Q breakfast   losartan  50 mg Oral Daily   multivitamin  1 tablet Oral Daily   sevelamer carbonate  2,400 mg Oral TID WC   venlafaxine XR  150 mg Oral Q breakfast   acetaminophen **OR** acetaminophen, chlorpheniramine-HYDROcodone, heparin, ipratropium-albuterol, magnesium hydroxide, ondansetron **OR** ondansetron (ZOFRAN) IV, traZODone  Assessment/ Plan:  Ms. Felicia Thompson is a 76 y.o.  female with past medical history of hypertension, sleep apnea, dyslipidemia, and end-stage renal disease on peritoneal dialysis.  Patient reports to the emergency room with progressive weakness foot and weakness with cough and congestion.  Patient has been admitted for Acute respiratory failure with hypoxia ([place]) [J96.01] Sepsis due to pneumonia ([place]) [J18.9, A41.9] Community acquired pneumonia, unspecified laterality [J18.9] Sepsis, due to unspecified organism, unspecified whether acute organ dysfunction present ([place]) [A41.9]   [facility] PD [place] 81 kg PD- 2L fills, 4 cycles, 8 hrs, 10/20/no last fill  End-stage renal disease on peritoneal dialysis.  We will continue nightly treatments during this admission.  Continue nightly treatments  2. Anemia of chronic kidney disease   Lab Results  Component Value Date   HGB 8.3 (L) 12/17/2021  Mircera received outpatient. Will continue to monitor  3. Secondary Hyperparathyroidism: with outpatient labs: PTH 188, phosphorus 4.1, calcium 7.8 on 11/20/21.   Lab Results  Component Value Date   CALCIUM 7.5 (L) 12/18/2021   PHOS 5.2 (H) 12/18/2021  Will continue to monitor bone minerals during this admission.  Continue calcium carbonate and Renvela with meals outpatient.  4.  Hypertension with chronic kidney disease.  Home regimen includes carvedilol and losartan.  Currently receiving both this admission. BP 192/76  5. 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 Home  regimen includes glipizide and NPH. Most recent hemoglobin A1c is 5.5 on 11/20/21.   6.  Community-acquired pneumonia, right basilar.  Antibiotics include Rocephin and azithromycin, along with supportive care therapies.     LOS: 2 [place] 1/18/202312:17 PM

## 2021-12-19 NOTE — TOC Initial Note (Signed)
Transition of Care [facility]) - Initial/Assessment Note    Patient Details  Name: Felicia Thompson MRN: [id number] Date of Birth: Apr 18, 1946  Transition of Care [facility]) CM/SW Contact:    Pete Pelt, RN Phone Number: 12/19/2021, 10:21 AM  Clinical Narrative:     Patient lives at home with daughter and son in law.  She states they help her with her care and transport her to appointments and pharmacy.  She is up to date with all of her appointments.    Patient is able to take medications as prescribed and rarely misses a dose.  Patient is amenable to discharge with [place] will take patient back to their services, confirmed by [place].  Patient states she will not require DME.              Expected Discharge Plan: [place] Barriers to Discharge: Barriers Resolved   Patient Goals and CMS Choice        Expected Discharge Plan and Services Expected Discharge Plan: [place]   Discharge Planning Services: CM Consult Post Acute Care Choice: [place] arrangements for the past 2 months: Single Family Home Expected Discharge Date: 12/19/21                         HH Arranged: PT, OT HH Agency: [place] Date [place]: 12/19/21 Time HH Agency Contacted: 35 Representative spoke with at [place]: [place]  Prior Living Arrangements/Services Living arrangements for the past 2 months: [place] with:: Self, Adult Children Patient language and need for interpreter reviewed:: Yes (No interpreter required) Do you feel safe going back to the place where you live?: Yes      Need for Family Participation in Patient Care: Yes (Comment) Care giver support system in place?: Yes (comment)   Criminal Activity/Legal Involvement Pertinent to Current Situation/Hospitalization: No - Comment as needed  Activities of Daily Living Home Assistive Devices/Equipment: Shower chair with back,  Hand-held shower hose ADL Screening (condition at time of admission) Patient's cognitive ability adequate to safely complete daily activities?: Yes Is the patient deaf or have difficulty hearing?: No Does the patient have difficulty seeing, even when wearing glasses/contacts?: No Does the patient have difficulty concentrating, remembering, or making decisions?: No Patient able to express need for assistance with ADLs?: Yes Does the patient have difficulty dressing or bathing?: No Independently performs ADLs?: Yes (appropriate for developmental age) Does the patient have difficulty walking or climbing stairs?: No Weakness of Legs: None Weakness of Arms/Hands: None  Permission Sought/Granted Permission sought to share information with : Case Manager Permission granted to share information with : Yes, Verbal Permission Granted     Permission granted to share info w AGENCY: [place]        Emotional Assessment Appearance:: Appears stated age Attitude/Demeanor/Rapport: Gracious, Engaged Affect (typically observed): Pleasant, Appropriate Orientation: : Oriented to Self, Oriented to Place, Oriented to  Time, Oriented to Situation Alcohol / Substance Use: Not Applicable Psych Involvement: No (comment)  Admission diagnosis:  Acute respiratory failure with hypoxia ([place]) [J96.01] Sepsis due to pneumonia ([place]) [J18.9, A41.9] Community acquired pneumonia, unspecified laterality [J18.9] Sepsis, due to unspecified organism, unspecified whether acute organ dysfunction present [facility]) [A41.9] Patient Active Problem List   Diagnosis Date Noted   Acute respiratory failure with hypoxia ([place])    Sepsis ([place]) 12/17/2021   Transient alteration of awareness 10/10/2021  Seizure ([place]) 10/08/2021   ESRD on peritoneal dialysis ([place]) 05/15/2021   OSA (obstructive sleep apnea) 05/15/2021   DM (diabetes mellitus), type 2 with complications ([place]) 31/59/4585   Hypertension 05/15/2021    Hyperlipidemia associated with type 2 diabetes mellitus ([place]) 05/15/2021   Vitamin D deficiency 05/15/2021   Abnormal TSH 05/15/2021   Hypertriglyceridemia 05/15/2021   Anemia in chronic kidney disease, on chronic dialysis ([place]) 05/15/2021   PCP:  Isaac Bliss, Rayford Halsted, MD Pharmacy:   [facility] [id number] - [address], [place] [address] Phone: [phone] Fax: [phone]     Social Determinants of Health (SDOH) Interventions    Readmission Risk Interventions No flowsheet data found.

## 2021-12-19 NOTE — TOC Progression Note (Signed)
Transition of Care [facility]) - Progression Note    Patient Details  Name: Felicia Thompson MRN: [id number] Date of Birth: 02-14-1946  Transition of Care [facility]) CM/SW [place], RN Phone Number: 12/19/2021, 11:15 AM  Clinical Narrative:  [facility], daughter refused [facility], requested [facility], cheryl from [facility] accepted patient for PT.  Nursing was requested, but MD states there is no need at this time.     Expected Discharge Plan: [place] Barriers to Discharge: Barriers Resolved  Expected Discharge Plan and Services Expected Discharge Plan: [place]   Discharge Planning Services: CM Consult Post Acute Care Choice: [place] arrangements for the past 2 months: Single Family Home Expected Discharge Date: 12/19/21                         HH Arranged: PT, OT HH Agency: [place] Date [place]: 12/19/21 Time [place]: 64 Representative spoke with at [place]: [place]   Social Determinants of Health ([place]) Interventions    Readmission Risk Interventions No flowsheet data found.

## 2021-12-20 ENCOUNTER — Ambulatory Visit: Payer: Medicare Other | Admitting: Endocrinology

## 2021-12-20 DIAGNOSIS — D509 Iron deficiency anemia, unspecified: Secondary | ICD-10-CM | POA: Diagnosis not present

## 2021-12-20 DIAGNOSIS — R82998 Other abnormal findings in urine: Secondary | ICD-10-CM | POA: Diagnosis not present

## 2021-12-20 DIAGNOSIS — E875 Hyperkalemia: Secondary | ICD-10-CM | POA: Diagnosis not present

## 2021-12-20 DIAGNOSIS — Z992 Dependence on renal dialysis: Secondary | ICD-10-CM | POA: Diagnosis not present

## 2021-12-20 DIAGNOSIS — N186 End stage renal disease: Secondary | ICD-10-CM | POA: Diagnosis not present

## 2021-12-20 DIAGNOSIS — Z79899 Other long term (current) drug therapy: Secondary | ICD-10-CM | POA: Diagnosis not present

## 2021-12-20 DIAGNOSIS — D631 Anemia in chronic kidney disease: Secondary | ICD-10-CM | POA: Diagnosis not present

## 2021-12-20 DIAGNOSIS — N2581 Secondary hyperparathyroidism of renal origin: Secondary | ICD-10-CM | POA: Diagnosis not present

## 2021-12-20 NOTE — Discharge Summary (Signed)
Physician Discharge Summary   Patient: Felicia Thompson MRN: [id number] DOB: September 05, 1946  Admit date:     12/16/2021  Discharge date: 12/19/2021  Discharge Physician: Max Sane   PCP: Isaac Bliss, Rayford Halsted, MD   Recommendations at discharge:   Follow-up with outpatient providers as requested  Discharge Diagnoses Principal Problem:   Sepsis [facility]) Active Problems:   Acute respiratory failure with hypoxia [facility])  Hospital Course    76 year old female history significant for hypertension, sleep apnea, dyslipidemia, ESRD on peritoneal dialysis who presents to the ED with progressive weakness associate with cough and shortness of breath and chest congestion.  On presentation patient meets sepsis criteria with likely source pneumonia.  Started on empiric CAP treatment.  Nephrology engaged for peritoneal dialysis.      Assessment & Plan:   Right basal community-acquired pneumonia Sepsis secondary to above Present on admission and resolved with treatment. -Responded appropriately to course of antibiotic, antitussives   Acute hypoxic respiratory failure secondary to above Initially saturating in the 80s requiring 2 L nasal cannula weaned off with treatment of pneumonia   Hyperlipidemia PTA statin   Essential hypertension Continue Coreg and Cozaar   ESRD on PD Nephrology managed her inpatient PD while here   Type 2 diabetes mellitus Managed with sliding scale and Semglee while in the hospital.  Resume outpatient regiment at discharge   Depression Continue continue Effexor   OSA Nightly CPAP    Consultants: Nephrology Procedures performed: Peritoneal dialysis while in the hospital Disposition: Home health Diet recommendation: Renal diet  DISCHARGE MEDICATION: Allergies as of 12/19/2021       Reactions   Tape Other (See Comments)   Blisters   Tetracyclines & Related Rash        Medication List     TAKE these medications    amoxicillin-clavulanate  500-125 MG tablet Commonly known as: Augmentin Take 1 tablet (500 mg total) by mouth 2 (two) times daily for 7 days.   aspirin 81 MG chewable tablet Chew 81 mg by mouth daily.   atorvastatin 80 MG tablet Commonly known as: LIPITOR TAKE ONE TABLET BY MOUTH DAILY   benzonatate 100 MG capsule Commonly known as: Tessalon Perles 1-2 capsules as needed cough   carvedilol 25 MG tablet Commonly known as: COREG Take 25 mg by mouth 2 (two) times daily with a meal.   [employer] ADV PROBIOTIC GUMMIES PO Take 1 tablet by mouth at bedtime.   FreeStyle Libre 2 Sensor Misc 1 Device by Does not apply route every 14 (fourteen) days.   glipiZIDE 5 MG tablet Commonly known as: GLUCOTROL Take 1 tablet (5 mg total) by mouth daily before breakfast.   guaiFENesin 600 MG 12 hr tablet Commonly known as: MUCINEX Take 1 tablet (600 mg total) by mouth 2 (two) times daily for 7 days.   HumuLIN 70/30 (70-30) 100 UNIT/ML injection Generic drug: insulin NPH-regular Human Inject 10 Units into the skin daily with breakfast.   losartan 50 MG tablet Commonly known as: COZAAR Take 1 tablet (50 mg total) by mouth daily.   RenaPlex Tabs Take 1 tablet by mouth daily.   sevelamer carbonate 800 MG tablet Commonly known as: RENVELA Take 2,400 mg by mouth 3 (three) times daily with meals.   venlafaxine XR 150 MG 24 hr capsule Commonly known as: EFFEXOR-XR Take 150 mg by mouth daily with breakfast.               Discharge Care Instructions  (From admission, onward)  Start     Ordered   12/19/21 0000  Discharge wound care:       Comments: As above   12/19/21 7858            Follow-up Information     Isaac Bliss, Rayford Halsted, MD. Go on 12/26/2021.   Specialty: Internal Medicine Why: @12 :45 Contact information: [place] [address] [phone]                 Discharge Exam: Filed Weights   12/16/21 2034 12/17/21 1850 12/18/21 1757   Weight: 83.9 kg 88.8 kg 90.6 kg   General exam: Appears calm and comfortable  Respiratory system: Clear to auscultation bilaterally, no wheezing, rales, rhonchi or crepitation.  She is on room air Cardiovascular system: S1-S2, RRR, no murmurs, no pedal edema Gastrointestinal system: Abdomen is nondistended, soft and nontender. No organomegaly or masses felt. Normal bowel sounds heard. Central nervous system: Alert and oriented. No focal neurological deficits. Extremities: Symmetric 5 x 5 power. Skin: No rashes, lesions or ulcers Psychiatry: Judgement and insight appear normal. Mood & affect appropriate.    Condition at discharge: good  The results of significant diagnostics from this hospitalization (including imaging, microbiology, ancillary and laboratory) are listed below for reference.   Imaging Studies: DG Chest 2 View  Result Date: 12/16/2021 CLINICAL DATA:  Cough and fever EXAM: CHEST - 2 VIEW COMPARISON:  None. FINDINGS: The heart size and mediastinal contours are within normal limits. Bibasilar atelectasis. Otherwise both lungs are clear. The visualized skeletal structures are unremarkable. IMPRESSION: Bibasilar atelectasis. Electronically Signed   By: Ulyses Jarred M.D.   On: 12/16/2021 21:21   ECHOCARDIOGRAM COMPLETE  Result Date: 12/10/2021    ECHOCARDIOGRAM REPORT   Patient Name:   Felicia Thompson Date of Exam: 12/10/2021 Medical Rec #:  [id number]              Height:       67.0 in Accession #:    [id number]             Weight:       186.4 lb Date of Birth:  01-15-1946               BSA:          1.963 m Patient Age:    76 years               BP:           193/93 mmHg Patient Gender: F                      HR:           80 bpm. Exam Location:  Outpatient Procedure: 2D Echo Indications:    stroke  History:        Patient has no prior history of Echocardiogram examinations. End                 stage renal disease. and Stroke; Risk Factors:Hypertension,                  Dyslipidemia, Sleep Apnea and Diabetes.  Sonographer:    Johny Chess RDCS Referring Phys: [id number] AMADOU CAMARA IMPRESSIONS  1. Left ventricular ejection fraction, by estimation, is 60 to 65%. The left ventricle has normal function. The left ventricle has no regional wall motion abnormalities. There is moderate hypertrophy of the basal septum. The rest of the LV segments demonstrate mild asymmetric  left ventricular hypertrophy. Left ventricular diastolic parameters are consistent with Grade I diastolic dysfunction (impaired relaxation).  2. Right ventricular systolic function is normal. The right ventricular size is mildly enlarged. There is normal pulmonary artery systolic pressure.  3. Left atrial size was mildly dilated.  4. A small pericardial effusion is present. The pericardial effusion is circumferential.  5. The mitral valve is normal in structure. Trivial mitral valve regurgitation.  6. The aortic valve is tricuspid. There is mild calcification of the aortic valve. There is mild thickening of the aortic valve. Aortic valve regurgitation is trivial. Aortic valve sclerosis/calcification is present, without any evidence of aortic stenosis.  7. The inferior vena cava is normal in size with greater than 50% respiratory variability, suggesting right atrial pressure of 3 mmHg. Comparison(s): No prior Echocardiogram. FINDINGS  Left Ventricle: Left ventricular ejection fraction, by estimation, is 60 to 65%. The left ventricle has normal function. The left ventricle has no regional wall motion abnormalities. The left ventricular internal cavity size was normal in size. There is  moderate hypertrophy of the basal septum. The rest of the LV segments demonstrate mild asymmetric left ventricular hypertrophy. Left ventricular diastolic parameters are consistent with Grade I diastolic dysfunction (impaired relaxation). Right Ventricle: The right ventricular size is mildly enlarged. No increase in right ventricular  wall thickness. Right ventricular systolic function is normal. There is normal pulmonary artery systolic pressure. The tricuspid regurgitant velocity is 2.23  m/s, and with an assumed right atrial pressure of 3 mmHg, the estimated right ventricular systolic pressure is 19.4 mmHg. Left Atrium: Left atrial size was mildly dilated. Right Atrium: Right atrial size was normal in size. Pericardium: A small pericardial effusion is present. The pericardial effusion is circumferential. Mitral Valve: The mitral valve is normal in structure. There is mild thickening of the mitral valve leaflet(s). There is mild calcification of the mitral valve leaflet(s). Mild to moderate mitral annular calcification. Trivial mitral valve regurgitation. Tricuspid Valve: The tricuspid valve is normal in structure. Tricuspid valve regurgitation is trivial. Aortic Valve: The aortic valve is tricuspid. There is mild calcification of the aortic valve. There is mild thickening of the aortic valve. Aortic valve regurgitation is trivial. Aortic valve sclerosis/calcification is present, without any evidence of aortic stenosis. Pulmonic Valve: The pulmonic valve was normal in structure. Pulmonic valve regurgitation is trivial. Aorta: The aortic root and ascending aorta are structurally normal, with no evidence of dilitation. Venous: The inferior vena cava is normal in size with greater than 50% respiratory variability, suggesting right atrial pressure of 3 mmHg. IAS/Shunts: The atrial septum is grossly normal.  LEFT VENTRICLE PLAX 2D LVIDd:         4.30 cm   Diastology LVIDs:         2.70 cm   LV e' medial:    3.92 cm/s LV PW:         0.90 cm   LV E/e' medial:  16.9 LV IVS:        1.50 cm   LV e' lateral:   5.00 cm/s LVOT diam:     2.00 cm   LV E/e' lateral: 13.3 LV SV:         57 LV SV Index:   29 LVOT Area:     3.14 cm  RIGHT VENTRICLE             IVC RV S prime:     13.60 cm/s  IVC diam: 1.40 cm TAPSE (M-mode): 2.3 cm LEFT ATRIUM  Index         RIGHT ATRIUM           Index LA diam:        3.50 cm 1.78 cm/m   RA Area:     11.00 cm LA Vol (A2C):   74.2 ml 37.81 ml/m  RA Volume:   22.00 ml  11.21 ml/m LA Vol (A4C):   43.9 ml 22.37 ml/m LA Biplane Vol: 59.8 ml 30.47 ml/m  AORTIC VALVE LVOT Vmax:   90.70 cm/s LVOT Vmean:  61.600 cm/s LVOT VTI:    0.183 m  AORTA Ao Root diam: 2.90 cm Ao Asc diam:  2.90 cm MITRAL VALVE                TRICUSPID VALVE MV Area (PHT): 2.34 cm     TR Peak grad:   19.9 mmHg MV Decel Time: 324 msec     TR Vmax:        223.00 cm/s MV E velocity: 66.30 cm/s MV A velocity: 120.00 cm/s  SHUNTS MV E/A ratio:  0.55         Systemic VTI:  0.18 m                             Systemic Diam: 2.00 cm Gwyndolyn Kaufman MD Electronically signed by Gwyndolyn Kaufman MD Signature Date/Time: 12/10/2021/7:27:03 PM    Final     Microbiology: Results for orders placed or performed during the hospital encounter of 12/16/21  Resp Panel by RT-PCR (Flu A&B, Covid) Nasopharyngeal Swab     Status: None   Collection Time: 12/16/21  8:35 PM   Specimen: Nasopharyngeal Swab; Nasopharyngeal(NP) swabs in vial transport medium  Result Value Ref Range Status   SARS Coronavirus 2 by RT PCR NEGATIVE NEGATIVE Final    Comment: (NOTE) SARS-CoV-2 target nucleic acids are NOT DETECTED.  The SARS-CoV-2 RNA is generally detectable in upper respiratory specimens during the acute phase of infection. The lowest concentration of SARS-CoV-2 viral copies this assay can detect is 138 copies/mL. A negative result does not preclude SARS-Cov-2 infection and should not be used as the sole basis for treatment or other patient management decisions. A negative result may occur with  improper specimen collection/handling, submission of specimen other than nasopharyngeal swab, presence of viral mutation(s) within the areas targeted by this assay, and inadequate number of viral copies(<138 copies/mL). A negative result must be combined with clinical  observations, patient history, and epidemiological information. The expected result is Negative.  Fact Sheet for Patients:  EntrepreneurPulse.com.au  Fact Sheet for Healthcare Providers:  IncredibleEmployment.be  This test is no t yet approved or cleared by the [place] FDA and  has been authorized for detection and/or diagnosis of SARS-CoV-2 by FDA under an Emergency Use Authorization (EUA). This EUA will remain  in effect (meaning this test can be used) for the duration of the COVID-19 declaration under Section 564(b)(1) of the Act, 21 U.S.C.section 360bbb-3(b)(1), unless the authorization is terminated  or revoked sooner.       Influenza A by PCR NEGATIVE NEGATIVE Final   Influenza B by PCR NEGATIVE NEGATIVE Final    Comment: (NOTE) The Xpert Xpress SARS-CoV-2/FLU/RSV plus assay is intended as an aid in the diagnosis of influenza from Nasopharyngeal swab specimens and should not be used as a sole basis for treatment. Nasal washings and aspirates are unacceptable for Xpert Xpress SARS-CoV-2/FLU/RSV testing.  Fact Sheet for Patients:  EntrepreneurPulse.com.au  Fact Sheet for Healthcare Providers: IncredibleEmployment.be  This test is not yet approved or cleared by the [place] FDA and has been authorized for detection and/or diagnosis of SARS-CoV-2 by FDA under an Emergency Use Authorization (EUA). This EUA will remain in effect (meaning this test can be used) for the duration of the COVID-19 declaration under Section 564(b)(1) of the Act, 21 U.S.C. section 360bbb-3(b)(1), unless the authorization is terminated or revoked.  Performed at [facility], [address] [place], [place] [address]   Blood culture (routine x 2)     Status: None (Preliminary result)   Collection Time: 12/16/21  8:57 PM   Specimen: BLOOD  Result Value Ref Range Status   Specimen Description BLOOD LEFT  ANTECUBITAL  Final   Special Requests   Final    BOTTLES DRAWN AEROBIC AND ANAEROBIC Blood Culture adequate volume   Culture   Final    NO GROWTH 4 DAYS Performed at [facility], [address] [place], [place] [address]    Report Status PENDING  Incomplete  Blood culture (routine x 2)     Status: None (Preliminary result)   Collection Time: 12/16/21 11:34 PM   Specimen: BLOOD  Result Value Ref Range Status   Specimen Description BLOOD RIGHT ANTECUBITAL  Final   Special Requests   Final    BOTTLES DRAWN AEROBIC AND ANAEROBIC Blood Culture adequate volume   Culture   Final    NO GROWTH 3 DAYS Performed at [facility], [address] [place], [place] [address]    Report Status PENDING  Incomplete    Labs: CBC: Recent Labs  Lab 12/16/21 2045 12/17/21 0607  WBC 10.8* 8.7  NEUTROABS 8.3*  --   HGB 9.8* 8.3*  HCT 28.2* 23.5*  MCV 91.9 91.1  PLT 165 005*   Basic Metabolic Panel: Recent Labs  Lab 12/16/21 2045 12/17/21 0607 12/18/21 0602  NA 137 138 138  K 4.4 4.6 3.9  CL 101 103 102  CO2 29 25 27   GLUCOSE 154* 155* 98  BUN 58* 61* 54*  CREATININE 6.93* 7.19* 6.81*  CALCIUM 8.0* 7.3* 7.5*  PHOS  --   --  5.2*   Liver Function Tests: Recent Labs  Lab 12/16/21 2045  AST 34  ALT 38  ALKPHOS 52  BILITOT 0.7  PROT 5.8*  ALBUMIN 2.9*   CBG: Recent Labs  Lab 12/18/21 1144 12/18/21 1608 12/18/21 2115 12/19/21 0740 12/19/21 1219  GLUCAP 133* 215* 192* 99 118*    Discharge time spent: greater than 30 minutes.  Signed: Max Sane, MD [facility] 12/20/2021

## 2021-12-21 DIAGNOSIS — Z992 Dependence on renal dialysis: Secondary | ICD-10-CM | POA: Diagnosis not present

## 2021-12-21 DIAGNOSIS — N186 End stage renal disease: Secondary | ICD-10-CM | POA: Diagnosis not present

## 2021-12-21 DIAGNOSIS — N2581 Secondary hyperparathyroidism of renal origin: Secondary | ICD-10-CM | POA: Diagnosis not present

## 2021-12-21 DIAGNOSIS — D509 Iron deficiency anemia, unspecified: Secondary | ICD-10-CM | POA: Diagnosis not present

## 2021-12-21 DIAGNOSIS — Z79899 Other long term (current) drug therapy: Secondary | ICD-10-CM | POA: Diagnosis not present

## 2021-12-21 DIAGNOSIS — D631 Anemia in chronic kidney disease: Secondary | ICD-10-CM | POA: Diagnosis not present

## 2021-12-21 DIAGNOSIS — E875 Hyperkalemia: Secondary | ICD-10-CM | POA: Diagnosis not present

## 2021-12-21 DIAGNOSIS — R82998 Other abnormal findings in urine: Secondary | ICD-10-CM | POA: Diagnosis not present

## 2021-12-21 LAB — CULTURE, BLOOD (ROUTINE X 2)
Culture: NO GROWTH
Special Requests: ADEQUATE

## 2021-12-22 DIAGNOSIS — Z79899 Other long term (current) drug therapy: Secondary | ICD-10-CM | POA: Diagnosis not present

## 2021-12-22 DIAGNOSIS — E875 Hyperkalemia: Secondary | ICD-10-CM | POA: Diagnosis not present

## 2021-12-22 DIAGNOSIS — E785 Hyperlipidemia, unspecified: Secondary | ICD-10-CM | POA: Diagnosis not present

## 2021-12-22 DIAGNOSIS — N186 End stage renal disease: Secondary | ICD-10-CM | POA: Diagnosis not present

## 2021-12-22 DIAGNOSIS — D631 Anemia in chronic kidney disease: Secondary | ICD-10-CM | POA: Diagnosis not present

## 2021-12-22 DIAGNOSIS — I120 Hypertensive chronic kidney disease with stage 5 chronic kidney disease or end stage renal disease: Secondary | ICD-10-CM | POA: Diagnosis not present

## 2021-12-22 DIAGNOSIS — D509 Iron deficiency anemia, unspecified: Secondary | ICD-10-CM | POA: Diagnosis not present

## 2021-12-22 DIAGNOSIS — R82998 Other abnormal findings in urine: Secondary | ICD-10-CM | POA: Diagnosis not present

## 2021-12-22 DIAGNOSIS — Z7982 Long term (current) use of aspirin: Secondary | ICD-10-CM | POA: Diagnosis not present

## 2021-12-22 DIAGNOSIS — N2581 Secondary hyperparathyroidism of renal origin: Secondary | ICD-10-CM | POA: Diagnosis not present

## 2021-12-22 DIAGNOSIS — Z9181 History of falling: Secondary | ICD-10-CM | POA: Diagnosis not present

## 2021-12-22 DIAGNOSIS — R296 Repeated falls: Secondary | ICD-10-CM | POA: Diagnosis not present

## 2021-12-22 DIAGNOSIS — E1122 Type 2 diabetes mellitus with diabetic chronic kidney disease: Secondary | ICD-10-CM | POA: Diagnosis not present

## 2021-12-22 DIAGNOSIS — J9601 Acute respiratory failure with hypoxia: Secondary | ICD-10-CM | POA: Diagnosis not present

## 2021-12-22 DIAGNOSIS — G4733 Obstructive sleep apnea (adult) (pediatric): Secondary | ICD-10-CM | POA: Diagnosis not present

## 2021-12-22 DIAGNOSIS — Z992 Dependence on renal dialysis: Secondary | ICD-10-CM | POA: Diagnosis not present

## 2021-12-22 DIAGNOSIS — Z9989 Dependence on other enabling machines and devices: Secondary | ICD-10-CM | POA: Diagnosis not present

## 2021-12-22 DIAGNOSIS — F32A Depression, unspecified: Secondary | ICD-10-CM | POA: Diagnosis not present

## 2021-12-22 DIAGNOSIS — A403 Sepsis due to Streptococcus pneumoniae: Secondary | ICD-10-CM | POA: Diagnosis not present

## 2021-12-22 DIAGNOSIS — E1165 Type 2 diabetes mellitus with hyperglycemia: Secondary | ICD-10-CM | POA: Diagnosis not present

## 2021-12-22 DIAGNOSIS — Z794 Long term (current) use of insulin: Secondary | ICD-10-CM | POA: Diagnosis not present

## 2021-12-22 LAB — CULTURE, BLOOD (ROUTINE X 2)
Culture: NO GROWTH
Special Requests: ADEQUATE

## 2021-12-23 DIAGNOSIS — R82998 Other abnormal findings in urine: Secondary | ICD-10-CM | POA: Diagnosis not present

## 2021-12-23 DIAGNOSIS — N2581 Secondary hyperparathyroidism of renal origin: Secondary | ICD-10-CM | POA: Diagnosis not present

## 2021-12-23 DIAGNOSIS — D509 Iron deficiency anemia, unspecified: Secondary | ICD-10-CM | POA: Diagnosis not present

## 2021-12-23 DIAGNOSIS — D631 Anemia in chronic kidney disease: Secondary | ICD-10-CM | POA: Diagnosis not present

## 2021-12-23 DIAGNOSIS — E875 Hyperkalemia: Secondary | ICD-10-CM | POA: Diagnosis not present

## 2021-12-23 DIAGNOSIS — Z79899 Other long term (current) drug therapy: Secondary | ICD-10-CM | POA: Diagnosis not present

## 2021-12-23 DIAGNOSIS — Z992 Dependence on renal dialysis: Secondary | ICD-10-CM | POA: Diagnosis not present

## 2021-12-23 DIAGNOSIS — N186 End stage renal disease: Secondary | ICD-10-CM | POA: Diagnosis not present

## 2021-12-24 DIAGNOSIS — N2581 Secondary hyperparathyroidism of renal origin: Secondary | ICD-10-CM | POA: Diagnosis not present

## 2021-12-24 DIAGNOSIS — Z992 Dependence on renal dialysis: Secondary | ICD-10-CM | POA: Diagnosis not present

## 2021-12-24 DIAGNOSIS — R82998 Other abnormal findings in urine: Secondary | ICD-10-CM | POA: Diagnosis not present

## 2021-12-24 DIAGNOSIS — D631 Anemia in chronic kidney disease: Secondary | ICD-10-CM | POA: Diagnosis not present

## 2021-12-24 DIAGNOSIS — N186 End stage renal disease: Secondary | ICD-10-CM | POA: Diagnosis not present

## 2021-12-24 DIAGNOSIS — Z79899 Other long term (current) drug therapy: Secondary | ICD-10-CM | POA: Diagnosis not present

## 2021-12-24 DIAGNOSIS — E875 Hyperkalemia: Secondary | ICD-10-CM | POA: Diagnosis not present

## 2021-12-24 DIAGNOSIS — D509 Iron deficiency anemia, unspecified: Secondary | ICD-10-CM | POA: Diagnosis not present

## 2021-12-25 DIAGNOSIS — N186 End stage renal disease: Secondary | ICD-10-CM | POA: Diagnosis not present

## 2021-12-25 DIAGNOSIS — R82998 Other abnormal findings in urine: Secondary | ICD-10-CM | POA: Diagnosis not present

## 2021-12-25 DIAGNOSIS — D509 Iron deficiency anemia, unspecified: Secondary | ICD-10-CM | POA: Diagnosis not present

## 2021-12-25 DIAGNOSIS — E875 Hyperkalemia: Secondary | ICD-10-CM | POA: Diagnosis not present

## 2021-12-25 DIAGNOSIS — Z79899 Other long term (current) drug therapy: Secondary | ICD-10-CM | POA: Diagnosis not present

## 2021-12-25 DIAGNOSIS — N2581 Secondary hyperparathyroidism of renal origin: Secondary | ICD-10-CM | POA: Diagnosis not present

## 2021-12-25 DIAGNOSIS — D631 Anemia in chronic kidney disease: Secondary | ICD-10-CM | POA: Diagnosis not present

## 2021-12-25 DIAGNOSIS — Z992 Dependence on renal dialysis: Secondary | ICD-10-CM | POA: Diagnosis not present

## 2021-12-26 ENCOUNTER — Ambulatory Visit (INDEPENDENT_AMBULATORY_CARE_PROVIDER_SITE_OTHER): Payer: Medicare Other | Admitting: Internal Medicine

## 2021-12-26 VITALS — BP 120/60 | HR 88 | Temp 98.0°F | Resp 18 | Ht 67.0 in | Wt 185.0 lb

## 2021-12-26 DIAGNOSIS — Z79899 Other long term (current) drug therapy: Secondary | ICD-10-CM | POA: Diagnosis not present

## 2021-12-26 DIAGNOSIS — A419 Sepsis, unspecified organism: Secondary | ICD-10-CM | POA: Diagnosis not present

## 2021-12-26 DIAGNOSIS — J189 Pneumonia, unspecified organism: Secondary | ICD-10-CM | POA: Diagnosis not present

## 2021-12-26 DIAGNOSIS — Z09 Encounter for follow-up examination after completed treatment for conditions other than malignant neoplasm: Secondary | ICD-10-CM

## 2021-12-26 DIAGNOSIS — N2581 Secondary hyperparathyroidism of renal origin: Secondary | ICD-10-CM | POA: Diagnosis not present

## 2021-12-26 DIAGNOSIS — J9601 Acute respiratory failure with hypoxia: Secondary | ICD-10-CM

## 2021-12-26 DIAGNOSIS — Z992 Dependence on renal dialysis: Secondary | ICD-10-CM | POA: Diagnosis not present

## 2021-12-26 DIAGNOSIS — D509 Iron deficiency anemia, unspecified: Secondary | ICD-10-CM | POA: Diagnosis not present

## 2021-12-26 DIAGNOSIS — R82998 Other abnormal findings in urine: Secondary | ICD-10-CM | POA: Diagnosis not present

## 2021-12-26 DIAGNOSIS — E875 Hyperkalemia: Secondary | ICD-10-CM | POA: Diagnosis not present

## 2021-12-26 DIAGNOSIS — N186 End stage renal disease: Secondary | ICD-10-CM | POA: Diagnosis not present

## 2021-12-26 DIAGNOSIS — D631 Anemia in chronic kidney disease: Secondary | ICD-10-CM | POA: Diagnosis not present

## 2021-12-26 NOTE — Progress Notes (Signed)
Established Patient Office Visit     This visit occurred during the SARS-CoV-2 public health emergency.  Safety protocols were in place, including screening questions prior to the visit, additional usage of staff PPE, and extensive cleaning of exam room while observing appropriate contact time as indicated for disinfecting solutions.    CC/Reason for Visit: Hospital follow-up  HPI: Felicia Thompson is a 76 y.o. female who is coming in today for the above mentioned reasons. Past Medical History is significant for: Hypertension, hyperlipidemia, well-controlled diabetes, obstructive sleep apnea, depression, end-stage renal disease on nightly peritoneal dialysis.  She was admitted to the hospital from 12/16/2021-12/19/2021 due to acute hypoxemic respiratory failure due to right lobar pneumonia with sepsis.  She did have an oxygen requirement which weaned after treatment.  She was discharged on 7 days of Augmentin and takes her last dose tomorrow.  She is feeling close to her baseline although still little fatigued.  She is coughing only sporadically at this point.    Past Medical/Surgical History: Past Medical History:  Diagnosis Date   Arthritis    Blood in stool    when appendix burst   Chronic kidney disease    Depression    DM (diabetes mellitus), type 2 with complications ([place])    ESRD on peritoneal dialysis ([place])    History of fainting spells of unknown cause    Hyperlipidemia    Hyperlipidemia associated with type 2 diabetes mellitus (HCC)    Hypertension    OSA (obstructive sleep apnea)     Past Surgical History:  Procedure Laterality Date   ABDOMINAL HYSTERECTOMY     APPENDECTOMY     CHOLECYSTECTOMY     TONSILECTOMY, ADENOIDECTOMY, BILATERAL MYRINGOTOMY AND TUBES      Social History:  reports that she has never smoked. She has never used smokeless tobacco. She reports that she does not drink alcohol and does not use drugs.  Allergies: Allergies  Allergen  Reactions   Tape Other (See Comments)    Blisters   Tetracyclines & Related Rash    Family History:  Family History  Problem Relation Age of Onset   Hearing loss Father    Hyperlipidemia Father    Heart disease Father    Early death Father    Cancer Father    Early death Brother    Drug abuse Brother    Diabetes Brother    Depression Brother    Alcohol abuse Brother    Miscarriages / [place] Daughter    Arthritis Daughter    Depression Daughter    Early death Maternal Grandmother    Heart attack Maternal Grandmother    Heart disease Maternal Grandmother    Hearing loss Paternal Grandmother    Heart disease Paternal Grandmother    Heart disease Paternal Grandfather    Early death Paternal Grandfather    Heart attack Paternal Grandfather      Current Outpatient Medications:    amoxicillin-clavulanate (AUGMENTIN) 500-125 MG tablet, Take 1 tablet (500 mg total) by mouth 2 (two) times daily for 7 days., Disp: 14 tablet, Rfl: 0   aspirin 81 MG chewable tablet, Chew 81 mg by mouth daily., Disp: , Rfl:    atorvastatin (LIPITOR) 80 MG tablet, TAKE ONE TABLET BY MOUTH DAILY, Disp: 90 tablet, Rfl: 1   carvedilol (COREG) 25 MG tablet, Take 25 mg by mouth 2 (two) times daily with a meal., Disp: , Rfl:    Continuous Blood Gluc Sensor (FREESTYLE LIBRE 2 SENSOR)  MISC, 1 Device by Does not apply route every 14 (fourteen) days., Disp: 6 each, Rfl: 3   glipiZIDE (GLUCOTROL) 5 MG tablet, Take 1 tablet (5 mg total) by mouth daily before breakfast., Disp: 60 tablet, Rfl: 1   guaiFENesin (MUCINEX) 600 MG 12 hr tablet, Take 1 tablet (600 mg total) by mouth 2 (two) times daily for 7 days., Disp: 14 tablet, Rfl: 0   insulin NPH-regular Human (HUMULIN 70/30) (70-30) 100 UNIT/ML injection, Inject 10 Units into the skin daily with breakfast., Disp: 30 mL, Rfl: 11   losartan (COZAAR) 50 MG tablet, Take 1 tablet (50 mg total) by mouth daily., Disp: 30 tablet, Rfl: 3   Multiple Vitamins-Minerals  (RENAPLEX) TABS, Take 1 tablet by mouth daily., Disp: , Rfl:    Probiotic Product ([employer] ADV PROBIOTIC GUMMIES PO), Take 1 tablet by mouth at bedtime., Disp: , Rfl:    sevelamer carbonate (RENVELA) 800 MG tablet, Take 2,400 mg by mouth 3 (three) times daily with meals., Disp: , Rfl:    venlafaxine XR (EFFEXOR-XR) 150 MG 24 hr capsule, Take 150 mg by mouth daily with breakfast., Disp: , Rfl:    benzonatate (TESSALON PERLES) 100 MG capsule, 1-2 capsules as needed cough (Patient not taking: Reported on 12/26/2021), Disp: 30 capsule, Rfl: 0  Review of Systems:  Constitutional: Denies fever, chills, diaphoresis, appetite change. HEENT: Denies photophobia, eye pain, redness, hearing loss, ear pain,  mouth sores, trouble swallowing, neck pain, neck stiffness and tinnitus.   Respiratory: Denies SOB, DOE,  chest tightness,  and wheezing.   Cardiovascular: Denies chest pain, palpitations and leg swelling.  Gastrointestinal: Denies nausea, vomiting, abdominal pain, diarrhea, constipation, blood in stool and abdominal distention.  Genitourinary: Denies dysuria, urgency, frequency, hematuria, flank pain and difficulty urinating.  Endocrine: Denies: hot or cold intolerance, sweats, changes in hair or nails, polyuria, polydipsia. Musculoskeletal: Denies myalgias, back pain, joint swelling, arthralgias and gait problem.  Skin: Denies pallor, rash and wound.  Neurological: Denies dizziness, seizures, syncope, weakness, light-headedness, numbness and headaches.  Hematological: Denies adenopathy. Easy bruising, personal or family bleeding history  Psychiatric/Behavioral: Denies suicidal ideation, mood changes, confusion, nervousness, sleep disturbance and agitation    Physical Exam: Vitals:   12/26/21 1259  BP: 120/60  Pulse: 88  Resp: 18  Temp: 98 F (36.7 C)  SpO2: 95%  Weight: 185 lb (83.9 kg)  Height: 5\' 7"  (1.702 m)    Body mass index is 28.98 kg/m.   Constitutional: NAD, calm,  comfortable Eyes: PERRL, lids and conjunctivae normal ENMT: Mucous membranes are moist.  Respiratory: clear to auscultation bilaterally, no wheezing, no crackles. Normal respiratory effort. No accessory muscle use.  Cardiovascular: Regular rate and rhythm, no murmurs / rubs / gallops. No extremity edema.  Neurologic: Grossly intact and nonfocal Psychiatric: Normal judgment and insight. Alert and oriented x 3. Normal mood.    Impression and Plan:  Hospital discharge follow-up  Acute respiratory failure with hypoxia ([place])  Pneumonia of right lower lobe due to infectious organism  Sepsis, due to unspecified organism, unspecified whether acute organ dysfunction present [facility])  [facility] chart reviewed in great detail. -No labs were requested on follow-up today. -Her O2 sat is 95 to 96% on room air. -She takes her last dose of Augmentin tonight. -She has follow-up with her nephrologist tomorrow.  Time spent: 33 minutes reviewing chart, interviewing and examining patient and formulating plan of care.    Lelon Frohlich, MD [facility]

## 2021-12-27 DIAGNOSIS — Z992 Dependence on renal dialysis: Secondary | ICD-10-CM | POA: Diagnosis not present

## 2021-12-27 DIAGNOSIS — A403 Sepsis due to Streptococcus pneumoniae: Secondary | ICD-10-CM | POA: Diagnosis not present

## 2021-12-27 DIAGNOSIS — N2581 Secondary hyperparathyroidism of renal origin: Secondary | ICD-10-CM | POA: Diagnosis not present

## 2021-12-27 DIAGNOSIS — E1165 Type 2 diabetes mellitus with hyperglycemia: Secondary | ICD-10-CM | POA: Diagnosis not present

## 2021-12-27 DIAGNOSIS — E785 Hyperlipidemia, unspecified: Secondary | ICD-10-CM | POA: Diagnosis not present

## 2021-12-27 DIAGNOSIS — R296 Repeated falls: Secondary | ICD-10-CM | POA: Diagnosis not present

## 2021-12-27 DIAGNOSIS — Z79899 Other long term (current) drug therapy: Secondary | ICD-10-CM | POA: Diagnosis not present

## 2021-12-27 DIAGNOSIS — J9601 Acute respiratory failure with hypoxia: Secondary | ICD-10-CM | POA: Diagnosis not present

## 2021-12-27 DIAGNOSIS — Z794 Long term (current) use of insulin: Secondary | ICD-10-CM | POA: Diagnosis not present

## 2021-12-27 DIAGNOSIS — R82998 Other abnormal findings in urine: Secondary | ICD-10-CM | POA: Diagnosis not present

## 2021-12-27 DIAGNOSIS — Z9989 Dependence on other enabling machines and devices: Secondary | ICD-10-CM | POA: Diagnosis not present

## 2021-12-27 DIAGNOSIS — D631 Anemia in chronic kidney disease: Secondary | ICD-10-CM | POA: Diagnosis not present

## 2021-12-27 DIAGNOSIS — Z7982 Long term (current) use of aspirin: Secondary | ICD-10-CM | POA: Diagnosis not present

## 2021-12-27 DIAGNOSIS — F32A Depression, unspecified: Secondary | ICD-10-CM | POA: Diagnosis not present

## 2021-12-27 DIAGNOSIS — E875 Hyperkalemia: Secondary | ICD-10-CM | POA: Diagnosis not present

## 2021-12-27 DIAGNOSIS — I120 Hypertensive chronic kidney disease with stage 5 chronic kidney disease or end stage renal disease: Secondary | ICD-10-CM | POA: Diagnosis not present

## 2021-12-27 DIAGNOSIS — G4733 Obstructive sleep apnea (adult) (pediatric): Secondary | ICD-10-CM | POA: Diagnosis not present

## 2021-12-27 DIAGNOSIS — Z9181 History of falling: Secondary | ICD-10-CM | POA: Diagnosis not present

## 2021-12-27 DIAGNOSIS — E1122 Type 2 diabetes mellitus with diabetic chronic kidney disease: Secondary | ICD-10-CM | POA: Diagnosis not present

## 2021-12-27 DIAGNOSIS — D509 Iron deficiency anemia, unspecified: Secondary | ICD-10-CM | POA: Diagnosis not present

## 2021-12-27 DIAGNOSIS — N186 End stage renal disease: Secondary | ICD-10-CM | POA: Diagnosis not present

## 2021-12-28 DIAGNOSIS — N2581 Secondary hyperparathyroidism of renal origin: Secondary | ICD-10-CM | POA: Diagnosis not present

## 2021-12-28 DIAGNOSIS — Z79899 Other long term (current) drug therapy: Secondary | ICD-10-CM | POA: Diagnosis not present

## 2021-12-28 DIAGNOSIS — Z992 Dependence on renal dialysis: Secondary | ICD-10-CM | POA: Diagnosis not present

## 2021-12-28 DIAGNOSIS — R82998 Other abnormal findings in urine: Secondary | ICD-10-CM | POA: Diagnosis not present

## 2021-12-28 DIAGNOSIS — D631 Anemia in chronic kidney disease: Secondary | ICD-10-CM | POA: Diagnosis not present

## 2021-12-28 DIAGNOSIS — D509 Iron deficiency anemia, unspecified: Secondary | ICD-10-CM | POA: Diagnosis not present

## 2021-12-28 DIAGNOSIS — E875 Hyperkalemia: Secondary | ICD-10-CM | POA: Diagnosis not present

## 2021-12-28 DIAGNOSIS — N186 End stage renal disease: Secondary | ICD-10-CM | POA: Diagnosis not present

## 2021-12-29 DIAGNOSIS — N186 End stage renal disease: Secondary | ICD-10-CM | POA: Diagnosis not present

## 2021-12-29 DIAGNOSIS — Z992 Dependence on renal dialysis: Secondary | ICD-10-CM | POA: Diagnosis not present

## 2021-12-29 DIAGNOSIS — D631 Anemia in chronic kidney disease: Secondary | ICD-10-CM | POA: Diagnosis not present

## 2021-12-29 DIAGNOSIS — R82998 Other abnormal findings in urine: Secondary | ICD-10-CM | POA: Diagnosis not present

## 2021-12-29 DIAGNOSIS — E875 Hyperkalemia: Secondary | ICD-10-CM | POA: Diagnosis not present

## 2021-12-29 DIAGNOSIS — N2581 Secondary hyperparathyroidism of renal origin: Secondary | ICD-10-CM | POA: Diagnosis not present

## 2021-12-29 DIAGNOSIS — D509 Iron deficiency anemia, unspecified: Secondary | ICD-10-CM | POA: Diagnosis not present

## 2021-12-29 DIAGNOSIS — Z79899 Other long term (current) drug therapy: Secondary | ICD-10-CM | POA: Diagnosis not present

## 2021-12-30 DIAGNOSIS — E875 Hyperkalemia: Secondary | ICD-10-CM | POA: Diagnosis not present

## 2021-12-30 DIAGNOSIS — N186 End stage renal disease: Secondary | ICD-10-CM | POA: Diagnosis not present

## 2021-12-30 DIAGNOSIS — D509 Iron deficiency anemia, unspecified: Secondary | ICD-10-CM | POA: Diagnosis not present

## 2021-12-30 DIAGNOSIS — Z79899 Other long term (current) drug therapy: Secondary | ICD-10-CM | POA: Diagnosis not present

## 2021-12-30 DIAGNOSIS — Z992 Dependence on renal dialysis: Secondary | ICD-10-CM | POA: Diagnosis not present

## 2021-12-30 DIAGNOSIS — N2581 Secondary hyperparathyroidism of renal origin: Secondary | ICD-10-CM | POA: Diagnosis not present

## 2021-12-30 DIAGNOSIS — D631 Anemia in chronic kidney disease: Secondary | ICD-10-CM | POA: Diagnosis not present

## 2021-12-30 DIAGNOSIS — R82998 Other abnormal findings in urine: Secondary | ICD-10-CM | POA: Diagnosis not present

## 2021-12-31 DIAGNOSIS — N186 End stage renal disease: Secondary | ICD-10-CM | POA: Diagnosis not present

## 2021-12-31 DIAGNOSIS — Z79899 Other long term (current) drug therapy: Secondary | ICD-10-CM | POA: Diagnosis not present

## 2021-12-31 DIAGNOSIS — D509 Iron deficiency anemia, unspecified: Secondary | ICD-10-CM | POA: Diagnosis not present

## 2021-12-31 DIAGNOSIS — Z992 Dependence on renal dialysis: Secondary | ICD-10-CM | POA: Diagnosis not present

## 2021-12-31 DIAGNOSIS — R82998 Other abnormal findings in urine: Secondary | ICD-10-CM | POA: Diagnosis not present

## 2021-12-31 DIAGNOSIS — D631 Anemia in chronic kidney disease: Secondary | ICD-10-CM | POA: Diagnosis not present

## 2021-12-31 DIAGNOSIS — N2581 Secondary hyperparathyroidism of renal origin: Secondary | ICD-10-CM | POA: Diagnosis not present

## 2021-12-31 DIAGNOSIS — E875 Hyperkalemia: Secondary | ICD-10-CM | POA: Diagnosis not present

## 2022-01-01 DIAGNOSIS — Z7982 Long term (current) use of aspirin: Secondary | ICD-10-CM | POA: Diagnosis not present

## 2022-01-01 DIAGNOSIS — I120 Hypertensive chronic kidney disease with stage 5 chronic kidney disease or end stage renal disease: Secondary | ICD-10-CM | POA: Diagnosis not present

## 2022-01-01 DIAGNOSIS — N186 End stage renal disease: Secondary | ICD-10-CM | POA: Diagnosis not present

## 2022-01-01 DIAGNOSIS — E1165 Type 2 diabetes mellitus with hyperglycemia: Secondary | ICD-10-CM | POA: Diagnosis not present

## 2022-01-01 DIAGNOSIS — Z79899 Other long term (current) drug therapy: Secondary | ICD-10-CM | POA: Diagnosis not present

## 2022-01-01 DIAGNOSIS — F32A Depression, unspecified: Secondary | ICD-10-CM | POA: Diagnosis not present

## 2022-01-01 DIAGNOSIS — N2581 Secondary hyperparathyroidism of renal origin: Secondary | ICD-10-CM | POA: Diagnosis not present

## 2022-01-01 DIAGNOSIS — E875 Hyperkalemia: Secondary | ICD-10-CM | POA: Diagnosis not present

## 2022-01-01 DIAGNOSIS — Z9181 History of falling: Secondary | ICD-10-CM | POA: Diagnosis not present

## 2022-01-01 DIAGNOSIS — R296 Repeated falls: Secondary | ICD-10-CM | POA: Diagnosis not present

## 2022-01-01 DIAGNOSIS — R82998 Other abnormal findings in urine: Secondary | ICD-10-CM | POA: Diagnosis not present

## 2022-01-01 DIAGNOSIS — J9601 Acute respiratory failure with hypoxia: Secondary | ICD-10-CM | POA: Diagnosis not present

## 2022-01-01 DIAGNOSIS — Z794 Long term (current) use of insulin: Secondary | ICD-10-CM | POA: Diagnosis not present

## 2022-01-01 DIAGNOSIS — E785 Hyperlipidemia, unspecified: Secondary | ICD-10-CM | POA: Diagnosis not present

## 2022-01-01 DIAGNOSIS — Z992 Dependence on renal dialysis: Secondary | ICD-10-CM | POA: Diagnosis not present

## 2022-01-01 DIAGNOSIS — Z9989 Dependence on other enabling machines and devices: Secondary | ICD-10-CM | POA: Diagnosis not present

## 2022-01-01 DIAGNOSIS — E1122 Type 2 diabetes mellitus with diabetic chronic kidney disease: Secondary | ICD-10-CM | POA: Diagnosis not present

## 2022-01-01 DIAGNOSIS — A403 Sepsis due to Streptococcus pneumoniae: Secondary | ICD-10-CM | POA: Diagnosis not present

## 2022-01-01 DIAGNOSIS — G4733 Obstructive sleep apnea (adult) (pediatric): Secondary | ICD-10-CM | POA: Diagnosis not present

## 2022-01-01 DIAGNOSIS — D509 Iron deficiency anemia, unspecified: Secondary | ICD-10-CM | POA: Diagnosis not present

## 2022-01-01 DIAGNOSIS — D631 Anemia in chronic kidney disease: Secondary | ICD-10-CM | POA: Diagnosis not present

## 2022-01-02 DIAGNOSIS — A403 Sepsis due to Streptococcus pneumoniae: Secondary | ICD-10-CM | POA: Diagnosis not present

## 2022-01-02 DIAGNOSIS — N186 End stage renal disease: Secondary | ICD-10-CM | POA: Diagnosis not present

## 2022-01-02 DIAGNOSIS — R82998 Other abnormal findings in urine: Secondary | ICD-10-CM | POA: Diagnosis not present

## 2022-01-02 DIAGNOSIS — Z79899 Other long term (current) drug therapy: Secondary | ICD-10-CM | POA: Diagnosis not present

## 2022-01-02 DIAGNOSIS — Z794 Long term (current) use of insulin: Secondary | ICD-10-CM | POA: Diagnosis not present

## 2022-01-02 DIAGNOSIS — N2581 Secondary hyperparathyroidism of renal origin: Secondary | ICD-10-CM | POA: Diagnosis not present

## 2022-01-02 DIAGNOSIS — Z4932 Encounter for adequacy testing for peritoneal dialysis: Secondary | ICD-10-CM | POA: Diagnosis not present

## 2022-01-02 DIAGNOSIS — J9601 Acute respiratory failure with hypoxia: Secondary | ICD-10-CM | POA: Diagnosis not present

## 2022-01-02 DIAGNOSIS — F32A Depression, unspecified: Secondary | ICD-10-CM | POA: Diagnosis not present

## 2022-01-02 DIAGNOSIS — Z9181 History of falling: Secondary | ICD-10-CM | POA: Diagnosis not present

## 2022-01-02 DIAGNOSIS — Z7982 Long term (current) use of aspirin: Secondary | ICD-10-CM | POA: Diagnosis not present

## 2022-01-02 DIAGNOSIS — E1122 Type 2 diabetes mellitus with diabetic chronic kidney disease: Secondary | ICD-10-CM | POA: Diagnosis not present

## 2022-01-02 DIAGNOSIS — Z992 Dependence on renal dialysis: Secondary | ICD-10-CM | POA: Diagnosis not present

## 2022-01-02 DIAGNOSIS — R296 Repeated falls: Secondary | ICD-10-CM | POA: Diagnosis not present

## 2022-01-02 DIAGNOSIS — D509 Iron deficiency anemia, unspecified: Secondary | ICD-10-CM | POA: Diagnosis not present

## 2022-01-02 DIAGNOSIS — E875 Hyperkalemia: Secondary | ICD-10-CM | POA: Diagnosis not present

## 2022-01-02 DIAGNOSIS — D631 Anemia in chronic kidney disease: Secondary | ICD-10-CM | POA: Diagnosis not present

## 2022-01-02 DIAGNOSIS — G4733 Obstructive sleep apnea (adult) (pediatric): Secondary | ICD-10-CM | POA: Diagnosis not present

## 2022-01-02 DIAGNOSIS — Z9989 Dependence on other enabling machines and devices: Secondary | ICD-10-CM | POA: Diagnosis not present

## 2022-01-02 DIAGNOSIS — E1165 Type 2 diabetes mellitus with hyperglycemia: Secondary | ICD-10-CM | POA: Diagnosis not present

## 2022-01-02 DIAGNOSIS — I120 Hypertensive chronic kidney disease with stage 5 chronic kidney disease or end stage renal disease: Secondary | ICD-10-CM | POA: Diagnosis not present

## 2022-01-02 DIAGNOSIS — E785 Hyperlipidemia, unspecified: Secondary | ICD-10-CM | POA: Diagnosis not present

## 2022-01-03 DIAGNOSIS — D631 Anemia in chronic kidney disease: Secondary | ICD-10-CM | POA: Diagnosis not present

## 2022-01-03 DIAGNOSIS — Z992 Dependence on renal dialysis: Secondary | ICD-10-CM | POA: Diagnosis not present

## 2022-01-03 DIAGNOSIS — N186 End stage renal disease: Secondary | ICD-10-CM | POA: Diagnosis not present

## 2022-01-03 DIAGNOSIS — E875 Hyperkalemia: Secondary | ICD-10-CM | POA: Diagnosis not present

## 2022-01-03 DIAGNOSIS — R82998 Other abnormal findings in urine: Secondary | ICD-10-CM | POA: Diagnosis not present

## 2022-01-03 DIAGNOSIS — N2581 Secondary hyperparathyroidism of renal origin: Secondary | ICD-10-CM | POA: Diagnosis not present

## 2022-01-03 DIAGNOSIS — D509 Iron deficiency anemia, unspecified: Secondary | ICD-10-CM | POA: Diagnosis not present

## 2022-01-03 DIAGNOSIS — Z4932 Encounter for adequacy testing for peritoneal dialysis: Secondary | ICD-10-CM | POA: Diagnosis not present

## 2022-01-03 DIAGNOSIS — Z79899 Other long term (current) drug therapy: Secondary | ICD-10-CM | POA: Diagnosis not present

## 2022-01-04 DIAGNOSIS — E875 Hyperkalemia: Secondary | ICD-10-CM | POA: Diagnosis not present

## 2022-01-04 DIAGNOSIS — Z79899 Other long term (current) drug therapy: Secondary | ICD-10-CM | POA: Diagnosis not present

## 2022-01-04 DIAGNOSIS — D631 Anemia in chronic kidney disease: Secondary | ICD-10-CM | POA: Diagnosis not present

## 2022-01-04 DIAGNOSIS — D509 Iron deficiency anemia, unspecified: Secondary | ICD-10-CM | POA: Diagnosis not present

## 2022-01-04 DIAGNOSIS — N2581 Secondary hyperparathyroidism of renal origin: Secondary | ICD-10-CM | POA: Diagnosis not present

## 2022-01-04 DIAGNOSIS — Z992 Dependence on renal dialysis: Secondary | ICD-10-CM | POA: Diagnosis not present

## 2022-01-04 DIAGNOSIS — R82998 Other abnormal findings in urine: Secondary | ICD-10-CM | POA: Diagnosis not present

## 2022-01-04 DIAGNOSIS — N186 End stage renal disease: Secondary | ICD-10-CM | POA: Diagnosis not present

## 2022-01-04 DIAGNOSIS — Z4932 Encounter for adequacy testing for peritoneal dialysis: Secondary | ICD-10-CM | POA: Diagnosis not present

## 2022-01-05 DIAGNOSIS — D631 Anemia in chronic kidney disease: Secondary | ICD-10-CM | POA: Diagnosis not present

## 2022-01-05 DIAGNOSIS — Z79899 Other long term (current) drug therapy: Secondary | ICD-10-CM | POA: Diagnosis not present

## 2022-01-05 DIAGNOSIS — N186 End stage renal disease: Secondary | ICD-10-CM | POA: Diagnosis not present

## 2022-01-05 DIAGNOSIS — D509 Iron deficiency anemia, unspecified: Secondary | ICD-10-CM | POA: Diagnosis not present

## 2022-01-05 DIAGNOSIS — G4733 Obstructive sleep apnea (adult) (pediatric): Secondary | ICD-10-CM | POA: Diagnosis not present

## 2022-01-05 DIAGNOSIS — Z992 Dependence on renal dialysis: Secondary | ICD-10-CM | POA: Diagnosis not present

## 2022-01-05 DIAGNOSIS — Z4932 Encounter for adequacy testing for peritoneal dialysis: Secondary | ICD-10-CM | POA: Diagnosis not present

## 2022-01-05 DIAGNOSIS — N2581 Secondary hyperparathyroidism of renal origin: Secondary | ICD-10-CM | POA: Diagnosis not present

## 2022-01-05 DIAGNOSIS — R82998 Other abnormal findings in urine: Secondary | ICD-10-CM | POA: Diagnosis not present

## 2022-01-05 DIAGNOSIS — E875 Hyperkalemia: Secondary | ICD-10-CM | POA: Diagnosis not present

## 2022-01-06 DIAGNOSIS — D509 Iron deficiency anemia, unspecified: Secondary | ICD-10-CM | POA: Diagnosis not present

## 2022-01-06 DIAGNOSIS — Z4932 Encounter for adequacy testing for peritoneal dialysis: Secondary | ICD-10-CM | POA: Diagnosis not present

## 2022-01-06 DIAGNOSIS — E875 Hyperkalemia: Secondary | ICD-10-CM | POA: Diagnosis not present

## 2022-01-06 DIAGNOSIS — N2581 Secondary hyperparathyroidism of renal origin: Secondary | ICD-10-CM | POA: Diagnosis not present

## 2022-01-06 DIAGNOSIS — Z992 Dependence on renal dialysis: Secondary | ICD-10-CM | POA: Diagnosis not present

## 2022-01-06 DIAGNOSIS — N186 End stage renal disease: Secondary | ICD-10-CM | POA: Diagnosis not present

## 2022-01-06 DIAGNOSIS — D631 Anemia in chronic kidney disease: Secondary | ICD-10-CM | POA: Diagnosis not present

## 2022-01-06 DIAGNOSIS — R82998 Other abnormal findings in urine: Secondary | ICD-10-CM | POA: Diagnosis not present

## 2022-01-06 DIAGNOSIS — Z79899 Other long term (current) drug therapy: Secondary | ICD-10-CM | POA: Diagnosis not present

## 2022-01-07 DIAGNOSIS — Z992 Dependence on renal dialysis: Secondary | ICD-10-CM | POA: Diagnosis not present

## 2022-01-07 DIAGNOSIS — D631 Anemia in chronic kidney disease: Secondary | ICD-10-CM | POA: Diagnosis not present

## 2022-01-07 DIAGNOSIS — Z79899 Other long term (current) drug therapy: Secondary | ICD-10-CM | POA: Diagnosis not present

## 2022-01-07 DIAGNOSIS — N186 End stage renal disease: Secondary | ICD-10-CM | POA: Diagnosis not present

## 2022-01-07 DIAGNOSIS — Z4932 Encounter for adequacy testing for peritoneal dialysis: Secondary | ICD-10-CM | POA: Diagnosis not present

## 2022-01-07 DIAGNOSIS — D509 Iron deficiency anemia, unspecified: Secondary | ICD-10-CM | POA: Diagnosis not present

## 2022-01-07 DIAGNOSIS — N2581 Secondary hyperparathyroidism of renal origin: Secondary | ICD-10-CM | POA: Diagnosis not present

## 2022-01-07 DIAGNOSIS — E875 Hyperkalemia: Secondary | ICD-10-CM | POA: Diagnosis not present

## 2022-01-07 DIAGNOSIS — R82998 Other abnormal findings in urine: Secondary | ICD-10-CM | POA: Diagnosis not present

## 2022-01-08 DIAGNOSIS — N2581 Secondary hyperparathyroidism of renal origin: Secondary | ICD-10-CM | POA: Diagnosis not present

## 2022-01-08 DIAGNOSIS — R82998 Other abnormal findings in urine: Secondary | ICD-10-CM | POA: Diagnosis not present

## 2022-01-08 DIAGNOSIS — Z4932 Encounter for adequacy testing for peritoneal dialysis: Secondary | ICD-10-CM | POA: Diagnosis not present

## 2022-01-08 DIAGNOSIS — E875 Hyperkalemia: Secondary | ICD-10-CM | POA: Diagnosis not present

## 2022-01-08 DIAGNOSIS — Z992 Dependence on renal dialysis: Secondary | ICD-10-CM | POA: Diagnosis not present

## 2022-01-08 DIAGNOSIS — D631 Anemia in chronic kidney disease: Secondary | ICD-10-CM | POA: Diagnosis not present

## 2022-01-08 DIAGNOSIS — N186 End stage renal disease: Secondary | ICD-10-CM | POA: Diagnosis not present

## 2022-01-08 DIAGNOSIS — Z79899 Other long term (current) drug therapy: Secondary | ICD-10-CM | POA: Diagnosis not present

## 2022-01-08 DIAGNOSIS — D509 Iron deficiency anemia, unspecified: Secondary | ICD-10-CM | POA: Diagnosis not present

## 2022-01-09 DIAGNOSIS — Z9181 History of falling: Secondary | ICD-10-CM | POA: Diagnosis not present

## 2022-01-09 DIAGNOSIS — Z992 Dependence on renal dialysis: Secondary | ICD-10-CM | POA: Diagnosis not present

## 2022-01-09 DIAGNOSIS — D631 Anemia in chronic kidney disease: Secondary | ICD-10-CM | POA: Diagnosis not present

## 2022-01-09 DIAGNOSIS — N186 End stage renal disease: Secondary | ICD-10-CM | POA: Diagnosis not present

## 2022-01-09 DIAGNOSIS — E1122 Type 2 diabetes mellitus with diabetic chronic kidney disease: Secondary | ICD-10-CM | POA: Diagnosis not present

## 2022-01-09 DIAGNOSIS — A403 Sepsis due to Streptococcus pneumoniae: Secondary | ICD-10-CM | POA: Diagnosis not present

## 2022-01-09 DIAGNOSIS — Z4932 Encounter for adequacy testing for peritoneal dialysis: Secondary | ICD-10-CM | POA: Diagnosis not present

## 2022-01-09 DIAGNOSIS — E875 Hyperkalemia: Secondary | ICD-10-CM | POA: Diagnosis not present

## 2022-01-09 DIAGNOSIS — D509 Iron deficiency anemia, unspecified: Secondary | ICD-10-CM | POA: Diagnosis not present

## 2022-01-09 DIAGNOSIS — F32A Depression, unspecified: Secondary | ICD-10-CM | POA: Diagnosis not present

## 2022-01-09 DIAGNOSIS — E785 Hyperlipidemia, unspecified: Secondary | ICD-10-CM | POA: Diagnosis not present

## 2022-01-09 DIAGNOSIS — G4733 Obstructive sleep apnea (adult) (pediatric): Secondary | ICD-10-CM | POA: Diagnosis not present

## 2022-01-09 DIAGNOSIS — E1165 Type 2 diabetes mellitus with hyperglycemia: Secondary | ICD-10-CM | POA: Diagnosis not present

## 2022-01-09 DIAGNOSIS — Z794 Long term (current) use of insulin: Secondary | ICD-10-CM | POA: Diagnosis not present

## 2022-01-09 DIAGNOSIS — Z79899 Other long term (current) drug therapy: Secondary | ICD-10-CM | POA: Diagnosis not present

## 2022-01-09 DIAGNOSIS — I120 Hypertensive chronic kidney disease with stage 5 chronic kidney disease or end stage renal disease: Secondary | ICD-10-CM | POA: Diagnosis not present

## 2022-01-09 DIAGNOSIS — N2581 Secondary hyperparathyroidism of renal origin: Secondary | ICD-10-CM | POA: Diagnosis not present

## 2022-01-09 DIAGNOSIS — Z7982 Long term (current) use of aspirin: Secondary | ICD-10-CM | POA: Diagnosis not present

## 2022-01-09 DIAGNOSIS — R82998 Other abnormal findings in urine: Secondary | ICD-10-CM | POA: Diagnosis not present

## 2022-01-09 DIAGNOSIS — Z9989 Dependence on other enabling machines and devices: Secondary | ICD-10-CM | POA: Diagnosis not present

## 2022-01-09 DIAGNOSIS — R296 Repeated falls: Secondary | ICD-10-CM | POA: Diagnosis not present

## 2022-01-09 DIAGNOSIS — J9601 Acute respiratory failure with hypoxia: Secondary | ICD-10-CM | POA: Diagnosis not present

## 2022-01-10 DIAGNOSIS — Z79899 Other long term (current) drug therapy: Secondary | ICD-10-CM | POA: Diagnosis not present

## 2022-01-10 DIAGNOSIS — Z992 Dependence on renal dialysis: Secondary | ICD-10-CM | POA: Diagnosis not present

## 2022-01-10 DIAGNOSIS — Z4932 Encounter for adequacy testing for peritoneal dialysis: Secondary | ICD-10-CM | POA: Diagnosis not present

## 2022-01-10 DIAGNOSIS — N2581 Secondary hyperparathyroidism of renal origin: Secondary | ICD-10-CM | POA: Diagnosis not present

## 2022-01-10 DIAGNOSIS — R82998 Other abnormal findings in urine: Secondary | ICD-10-CM | POA: Diagnosis not present

## 2022-01-10 DIAGNOSIS — N186 End stage renal disease: Secondary | ICD-10-CM | POA: Diagnosis not present

## 2022-01-10 DIAGNOSIS — E875 Hyperkalemia: Secondary | ICD-10-CM | POA: Diagnosis not present

## 2022-01-10 DIAGNOSIS — D509 Iron deficiency anemia, unspecified: Secondary | ICD-10-CM | POA: Diagnosis not present

## 2022-01-10 DIAGNOSIS — D631 Anemia in chronic kidney disease: Secondary | ICD-10-CM | POA: Diagnosis not present

## 2022-01-11 DIAGNOSIS — R82998 Other abnormal findings in urine: Secondary | ICD-10-CM | POA: Diagnosis not present

## 2022-01-11 DIAGNOSIS — Z4932 Encounter for adequacy testing for peritoneal dialysis: Secondary | ICD-10-CM | POA: Diagnosis not present

## 2022-01-11 DIAGNOSIS — Z992 Dependence on renal dialysis: Secondary | ICD-10-CM | POA: Diagnosis not present

## 2022-01-11 DIAGNOSIS — D631 Anemia in chronic kidney disease: Secondary | ICD-10-CM | POA: Diagnosis not present

## 2022-01-11 DIAGNOSIS — D509 Iron deficiency anemia, unspecified: Secondary | ICD-10-CM | POA: Diagnosis not present

## 2022-01-11 DIAGNOSIS — Z79899 Other long term (current) drug therapy: Secondary | ICD-10-CM | POA: Diagnosis not present

## 2022-01-11 DIAGNOSIS — N186 End stage renal disease: Secondary | ICD-10-CM | POA: Diagnosis not present

## 2022-01-11 DIAGNOSIS — N2581 Secondary hyperparathyroidism of renal origin: Secondary | ICD-10-CM | POA: Diagnosis not present

## 2022-01-11 DIAGNOSIS — E875 Hyperkalemia: Secondary | ICD-10-CM | POA: Diagnosis not present

## 2022-01-12 DIAGNOSIS — R82998 Other abnormal findings in urine: Secondary | ICD-10-CM | POA: Diagnosis not present

## 2022-01-12 DIAGNOSIS — D631 Anemia in chronic kidney disease: Secondary | ICD-10-CM | POA: Diagnosis not present

## 2022-01-12 DIAGNOSIS — E875 Hyperkalemia: Secondary | ICD-10-CM | POA: Diagnosis not present

## 2022-01-12 DIAGNOSIS — D509 Iron deficiency anemia, unspecified: Secondary | ICD-10-CM | POA: Diagnosis not present

## 2022-01-12 DIAGNOSIS — Z992 Dependence on renal dialysis: Secondary | ICD-10-CM | POA: Diagnosis not present

## 2022-01-12 DIAGNOSIS — Z4932 Encounter for adequacy testing for peritoneal dialysis: Secondary | ICD-10-CM | POA: Diagnosis not present

## 2022-01-12 DIAGNOSIS — N186 End stage renal disease: Secondary | ICD-10-CM | POA: Diagnosis not present

## 2022-01-12 DIAGNOSIS — Z79899 Other long term (current) drug therapy: Secondary | ICD-10-CM | POA: Diagnosis not present

## 2022-01-12 DIAGNOSIS — N2581 Secondary hyperparathyroidism of renal origin: Secondary | ICD-10-CM | POA: Diagnosis not present

## 2022-01-13 DIAGNOSIS — Z4932 Encounter for adequacy testing for peritoneal dialysis: Secondary | ICD-10-CM | POA: Diagnosis not present

## 2022-01-13 DIAGNOSIS — D631 Anemia in chronic kidney disease: Secondary | ICD-10-CM | POA: Diagnosis not present

## 2022-01-13 DIAGNOSIS — D509 Iron deficiency anemia, unspecified: Secondary | ICD-10-CM | POA: Diagnosis not present

## 2022-01-13 DIAGNOSIS — Z79899 Other long term (current) drug therapy: Secondary | ICD-10-CM | POA: Diagnosis not present

## 2022-01-13 DIAGNOSIS — R82998 Other abnormal findings in urine: Secondary | ICD-10-CM | POA: Diagnosis not present

## 2022-01-13 DIAGNOSIS — N186 End stage renal disease: Secondary | ICD-10-CM | POA: Diagnosis not present

## 2022-01-13 DIAGNOSIS — N2581 Secondary hyperparathyroidism of renal origin: Secondary | ICD-10-CM | POA: Diagnosis not present

## 2022-01-13 DIAGNOSIS — Z992 Dependence on renal dialysis: Secondary | ICD-10-CM | POA: Diagnosis not present

## 2022-01-13 DIAGNOSIS — E875 Hyperkalemia: Secondary | ICD-10-CM | POA: Diagnosis not present

## 2022-01-14 DIAGNOSIS — E875 Hyperkalemia: Secondary | ICD-10-CM | POA: Diagnosis not present

## 2022-01-14 DIAGNOSIS — Z992 Dependence on renal dialysis: Secondary | ICD-10-CM | POA: Diagnosis not present

## 2022-01-14 DIAGNOSIS — N2581 Secondary hyperparathyroidism of renal origin: Secondary | ICD-10-CM | POA: Diagnosis not present

## 2022-01-14 DIAGNOSIS — F32A Depression, unspecified: Secondary | ICD-10-CM | POA: Diagnosis not present

## 2022-01-14 DIAGNOSIS — Z794 Long term (current) use of insulin: Secondary | ICD-10-CM | POA: Diagnosis not present

## 2022-01-14 DIAGNOSIS — E1165 Type 2 diabetes mellitus with hyperglycemia: Secondary | ICD-10-CM | POA: Diagnosis not present

## 2022-01-14 DIAGNOSIS — Z79899 Other long term (current) drug therapy: Secondary | ICD-10-CM | POA: Diagnosis not present

## 2022-01-14 DIAGNOSIS — R296 Repeated falls: Secondary | ICD-10-CM | POA: Diagnosis not present

## 2022-01-14 DIAGNOSIS — E1122 Type 2 diabetes mellitus with diabetic chronic kidney disease: Secondary | ICD-10-CM | POA: Diagnosis not present

## 2022-01-14 DIAGNOSIS — A403 Sepsis due to Streptococcus pneumoniae: Secondary | ICD-10-CM | POA: Diagnosis not present

## 2022-01-14 DIAGNOSIS — I120 Hypertensive chronic kidney disease with stage 5 chronic kidney disease or end stage renal disease: Secondary | ICD-10-CM | POA: Diagnosis not present

## 2022-01-14 DIAGNOSIS — D631 Anemia in chronic kidney disease: Secondary | ICD-10-CM | POA: Diagnosis not present

## 2022-01-14 DIAGNOSIS — Z9989 Dependence on other enabling machines and devices: Secondary | ICD-10-CM | POA: Diagnosis not present

## 2022-01-14 DIAGNOSIS — E785 Hyperlipidemia, unspecified: Secondary | ICD-10-CM | POA: Diagnosis not present

## 2022-01-14 DIAGNOSIS — R82998 Other abnormal findings in urine: Secondary | ICD-10-CM | POA: Diagnosis not present

## 2022-01-14 DIAGNOSIS — D509 Iron deficiency anemia, unspecified: Secondary | ICD-10-CM | POA: Diagnosis not present

## 2022-01-14 DIAGNOSIS — Z4932 Encounter for adequacy testing for peritoneal dialysis: Secondary | ICD-10-CM | POA: Diagnosis not present

## 2022-01-14 DIAGNOSIS — Z9181 History of falling: Secondary | ICD-10-CM | POA: Diagnosis not present

## 2022-01-14 DIAGNOSIS — N186 End stage renal disease: Secondary | ICD-10-CM | POA: Diagnosis not present

## 2022-01-14 DIAGNOSIS — Z7982 Long term (current) use of aspirin: Secondary | ICD-10-CM | POA: Diagnosis not present

## 2022-01-14 DIAGNOSIS — J9601 Acute respiratory failure with hypoxia: Secondary | ICD-10-CM | POA: Diagnosis not present

## 2022-01-14 DIAGNOSIS — G4733 Obstructive sleep apnea (adult) (pediatric): Secondary | ICD-10-CM | POA: Diagnosis not present

## 2022-01-15 DIAGNOSIS — E875 Hyperkalemia: Secondary | ICD-10-CM | POA: Diagnosis not present

## 2022-01-15 DIAGNOSIS — N186 End stage renal disease: Secondary | ICD-10-CM | POA: Diagnosis not present

## 2022-01-15 DIAGNOSIS — N2581 Secondary hyperparathyroidism of renal origin: Secondary | ICD-10-CM | POA: Diagnosis not present

## 2022-01-15 DIAGNOSIS — D631 Anemia in chronic kidney disease: Secondary | ICD-10-CM | POA: Diagnosis not present

## 2022-01-15 DIAGNOSIS — Z992 Dependence on renal dialysis: Secondary | ICD-10-CM | POA: Diagnosis not present

## 2022-01-15 DIAGNOSIS — Z4932 Encounter for adequacy testing for peritoneal dialysis: Secondary | ICD-10-CM | POA: Diagnosis not present

## 2022-01-15 DIAGNOSIS — D509 Iron deficiency anemia, unspecified: Secondary | ICD-10-CM | POA: Diagnosis not present

## 2022-01-15 DIAGNOSIS — R82998 Other abnormal findings in urine: Secondary | ICD-10-CM | POA: Diagnosis not present

## 2022-01-15 DIAGNOSIS — Z79899 Other long term (current) drug therapy: Secondary | ICD-10-CM | POA: Diagnosis not present

## 2022-01-16 DIAGNOSIS — F32A Depression, unspecified: Secondary | ICD-10-CM | POA: Diagnosis not present

## 2022-01-16 DIAGNOSIS — E875 Hyperkalemia: Secondary | ICD-10-CM | POA: Diagnosis not present

## 2022-01-16 DIAGNOSIS — J9601 Acute respiratory failure with hypoxia: Secondary | ICD-10-CM | POA: Diagnosis not present

## 2022-01-16 DIAGNOSIS — A403 Sepsis due to Streptococcus pneumoniae: Secondary | ICD-10-CM | POA: Diagnosis not present

## 2022-01-16 DIAGNOSIS — R296 Repeated falls: Secondary | ICD-10-CM | POA: Diagnosis not present

## 2022-01-16 DIAGNOSIS — Z9989 Dependence on other enabling machines and devices: Secondary | ICD-10-CM | POA: Diagnosis not present

## 2022-01-16 DIAGNOSIS — G4733 Obstructive sleep apnea (adult) (pediatric): Secondary | ICD-10-CM | POA: Diagnosis not present

## 2022-01-16 DIAGNOSIS — D631 Anemia in chronic kidney disease: Secondary | ICD-10-CM | POA: Diagnosis not present

## 2022-01-16 DIAGNOSIS — Z79899 Other long term (current) drug therapy: Secondary | ICD-10-CM | POA: Diagnosis not present

## 2022-01-16 DIAGNOSIS — I120 Hypertensive chronic kidney disease with stage 5 chronic kidney disease or end stage renal disease: Secondary | ICD-10-CM | POA: Diagnosis not present

## 2022-01-16 DIAGNOSIS — N186 End stage renal disease: Secondary | ICD-10-CM | POA: Diagnosis not present

## 2022-01-16 DIAGNOSIS — Z992 Dependence on renal dialysis: Secondary | ICD-10-CM | POA: Diagnosis not present

## 2022-01-16 DIAGNOSIS — Z9181 History of falling: Secondary | ICD-10-CM | POA: Diagnosis not present

## 2022-01-16 DIAGNOSIS — R82998 Other abnormal findings in urine: Secondary | ICD-10-CM | POA: Diagnosis not present

## 2022-01-16 DIAGNOSIS — N2581 Secondary hyperparathyroidism of renal origin: Secondary | ICD-10-CM | POA: Diagnosis not present

## 2022-01-16 DIAGNOSIS — Z794 Long term (current) use of insulin: Secondary | ICD-10-CM | POA: Diagnosis not present

## 2022-01-16 DIAGNOSIS — E785 Hyperlipidemia, unspecified: Secondary | ICD-10-CM | POA: Diagnosis not present

## 2022-01-16 DIAGNOSIS — E1122 Type 2 diabetes mellitus with diabetic chronic kidney disease: Secondary | ICD-10-CM | POA: Diagnosis not present

## 2022-01-16 DIAGNOSIS — D509 Iron deficiency anemia, unspecified: Secondary | ICD-10-CM | POA: Diagnosis not present

## 2022-01-16 DIAGNOSIS — Z4932 Encounter for adequacy testing for peritoneal dialysis: Secondary | ICD-10-CM | POA: Diagnosis not present

## 2022-01-16 DIAGNOSIS — E1165 Type 2 diabetes mellitus with hyperglycemia: Secondary | ICD-10-CM | POA: Diagnosis not present

## 2022-01-16 DIAGNOSIS — Z7982 Long term (current) use of aspirin: Secondary | ICD-10-CM | POA: Diagnosis not present

## 2022-01-17 DIAGNOSIS — Z4932 Encounter for adequacy testing for peritoneal dialysis: Secondary | ICD-10-CM | POA: Diagnosis not present

## 2022-01-17 DIAGNOSIS — N2581 Secondary hyperparathyroidism of renal origin: Secondary | ICD-10-CM | POA: Diagnosis not present

## 2022-01-17 DIAGNOSIS — D509 Iron deficiency anemia, unspecified: Secondary | ICD-10-CM | POA: Diagnosis not present

## 2022-01-17 DIAGNOSIS — N186 End stage renal disease: Secondary | ICD-10-CM | POA: Diagnosis not present

## 2022-01-17 DIAGNOSIS — Z992 Dependence on renal dialysis: Secondary | ICD-10-CM | POA: Diagnosis not present

## 2022-01-17 DIAGNOSIS — Z79899 Other long term (current) drug therapy: Secondary | ICD-10-CM | POA: Diagnosis not present

## 2022-01-17 DIAGNOSIS — D631 Anemia in chronic kidney disease: Secondary | ICD-10-CM | POA: Diagnosis not present

## 2022-01-17 DIAGNOSIS — E875 Hyperkalemia: Secondary | ICD-10-CM | POA: Diagnosis not present

## 2022-01-17 DIAGNOSIS — R82998 Other abnormal findings in urine: Secondary | ICD-10-CM | POA: Diagnosis not present

## 2022-01-18 DIAGNOSIS — Z992 Dependence on renal dialysis: Secondary | ICD-10-CM | POA: Diagnosis not present

## 2022-01-18 DIAGNOSIS — Z4932 Encounter for adequacy testing for peritoneal dialysis: Secondary | ICD-10-CM | POA: Diagnosis not present

## 2022-01-18 DIAGNOSIS — R82998 Other abnormal findings in urine: Secondary | ICD-10-CM | POA: Diagnosis not present

## 2022-01-18 DIAGNOSIS — Z79899 Other long term (current) drug therapy: Secondary | ICD-10-CM | POA: Diagnosis not present

## 2022-01-18 DIAGNOSIS — E875 Hyperkalemia: Secondary | ICD-10-CM | POA: Diagnosis not present

## 2022-01-18 DIAGNOSIS — D631 Anemia in chronic kidney disease: Secondary | ICD-10-CM | POA: Diagnosis not present

## 2022-01-18 DIAGNOSIS — N186 End stage renal disease: Secondary | ICD-10-CM | POA: Diagnosis not present

## 2022-01-18 DIAGNOSIS — N2581 Secondary hyperparathyroidism of renal origin: Secondary | ICD-10-CM | POA: Diagnosis not present

## 2022-01-18 DIAGNOSIS — D509 Iron deficiency anemia, unspecified: Secondary | ICD-10-CM | POA: Diagnosis not present

## 2022-01-19 DIAGNOSIS — D509 Iron deficiency anemia, unspecified: Secondary | ICD-10-CM | POA: Diagnosis not present

## 2022-01-19 DIAGNOSIS — D631 Anemia in chronic kidney disease: Secondary | ICD-10-CM | POA: Diagnosis not present

## 2022-01-19 DIAGNOSIS — Z4932 Encounter for adequacy testing for peritoneal dialysis: Secondary | ICD-10-CM | POA: Diagnosis not present

## 2022-01-19 DIAGNOSIS — R82998 Other abnormal findings in urine: Secondary | ICD-10-CM | POA: Diagnosis not present

## 2022-01-19 DIAGNOSIS — N2581 Secondary hyperparathyroidism of renal origin: Secondary | ICD-10-CM | POA: Diagnosis not present

## 2022-01-19 DIAGNOSIS — E875 Hyperkalemia: Secondary | ICD-10-CM | POA: Diagnosis not present

## 2022-01-19 DIAGNOSIS — Z79899 Other long term (current) drug therapy: Secondary | ICD-10-CM | POA: Diagnosis not present

## 2022-01-19 DIAGNOSIS — N186 End stage renal disease: Secondary | ICD-10-CM | POA: Diagnosis not present

## 2022-01-19 DIAGNOSIS — Z992 Dependence on renal dialysis: Secondary | ICD-10-CM | POA: Diagnosis not present

## 2022-01-20 DIAGNOSIS — Z992 Dependence on renal dialysis: Secondary | ICD-10-CM | POA: Diagnosis not present

## 2022-01-20 DIAGNOSIS — Z79899 Other long term (current) drug therapy: Secondary | ICD-10-CM | POA: Diagnosis not present

## 2022-01-20 DIAGNOSIS — R82998 Other abnormal findings in urine: Secondary | ICD-10-CM | POA: Diagnosis not present

## 2022-01-20 DIAGNOSIS — D631 Anemia in chronic kidney disease: Secondary | ICD-10-CM | POA: Diagnosis not present

## 2022-01-20 DIAGNOSIS — E875 Hyperkalemia: Secondary | ICD-10-CM | POA: Diagnosis not present

## 2022-01-20 DIAGNOSIS — D509 Iron deficiency anemia, unspecified: Secondary | ICD-10-CM | POA: Diagnosis not present

## 2022-01-20 DIAGNOSIS — N2581 Secondary hyperparathyroidism of renal origin: Secondary | ICD-10-CM | POA: Diagnosis not present

## 2022-01-20 DIAGNOSIS — N186 End stage renal disease: Secondary | ICD-10-CM | POA: Diagnosis not present

## 2022-01-20 DIAGNOSIS — Z4932 Encounter for adequacy testing for peritoneal dialysis: Secondary | ICD-10-CM | POA: Diagnosis not present

## 2022-01-21 DIAGNOSIS — R82998 Other abnormal findings in urine: Secondary | ICD-10-CM | POA: Diagnosis not present

## 2022-01-21 DIAGNOSIS — D631 Anemia in chronic kidney disease: Secondary | ICD-10-CM | POA: Diagnosis not present

## 2022-01-21 DIAGNOSIS — Z79899 Other long term (current) drug therapy: Secondary | ICD-10-CM | POA: Diagnosis not present

## 2022-01-21 DIAGNOSIS — Z992 Dependence on renal dialysis: Secondary | ICD-10-CM | POA: Diagnosis not present

## 2022-01-21 DIAGNOSIS — Z4932 Encounter for adequacy testing for peritoneal dialysis: Secondary | ICD-10-CM | POA: Diagnosis not present

## 2022-01-21 DIAGNOSIS — D509 Iron deficiency anemia, unspecified: Secondary | ICD-10-CM | POA: Diagnosis not present

## 2022-01-21 DIAGNOSIS — N186 End stage renal disease: Secondary | ICD-10-CM | POA: Diagnosis not present

## 2022-01-21 DIAGNOSIS — E875 Hyperkalemia: Secondary | ICD-10-CM | POA: Diagnosis not present

## 2022-01-21 DIAGNOSIS — N2581 Secondary hyperparathyroidism of renal origin: Secondary | ICD-10-CM | POA: Diagnosis not present

## 2022-01-22 DIAGNOSIS — N186 End stage renal disease: Secondary | ICD-10-CM | POA: Diagnosis not present

## 2022-01-22 DIAGNOSIS — Z79899 Other long term (current) drug therapy: Secondary | ICD-10-CM | POA: Diagnosis not present

## 2022-01-22 DIAGNOSIS — D509 Iron deficiency anemia, unspecified: Secondary | ICD-10-CM | POA: Diagnosis not present

## 2022-01-22 DIAGNOSIS — R82998 Other abnormal findings in urine: Secondary | ICD-10-CM | POA: Diagnosis not present

## 2022-01-22 DIAGNOSIS — Z4932 Encounter for adequacy testing for peritoneal dialysis: Secondary | ICD-10-CM | POA: Diagnosis not present

## 2022-01-22 DIAGNOSIS — E875 Hyperkalemia: Secondary | ICD-10-CM | POA: Diagnosis not present

## 2022-01-22 DIAGNOSIS — Z992 Dependence on renal dialysis: Secondary | ICD-10-CM | POA: Diagnosis not present

## 2022-01-22 DIAGNOSIS — D631 Anemia in chronic kidney disease: Secondary | ICD-10-CM | POA: Diagnosis not present

## 2022-01-22 DIAGNOSIS — N2581 Secondary hyperparathyroidism of renal origin: Secondary | ICD-10-CM | POA: Diagnosis not present

## 2022-01-23 ENCOUNTER — Ambulatory Visit: Payer: Medicare Other | Admitting: Endocrinology

## 2022-01-23 DIAGNOSIS — I120 Hypertensive chronic kidney disease with stage 5 chronic kidney disease or end stage renal disease: Secondary | ICD-10-CM | POA: Diagnosis not present

## 2022-01-23 DIAGNOSIS — A403 Sepsis due to Streptococcus pneumoniae: Secondary | ICD-10-CM | POA: Diagnosis not present

## 2022-01-23 DIAGNOSIS — Z794 Long term (current) use of insulin: Secondary | ICD-10-CM | POA: Diagnosis not present

## 2022-01-23 DIAGNOSIS — R82998 Other abnormal findings in urine: Secondary | ICD-10-CM | POA: Diagnosis not present

## 2022-01-23 DIAGNOSIS — R296 Repeated falls: Secondary | ICD-10-CM | POA: Diagnosis not present

## 2022-01-23 DIAGNOSIS — E785 Hyperlipidemia, unspecified: Secondary | ICD-10-CM | POA: Diagnosis not present

## 2022-01-23 DIAGNOSIS — G4733 Obstructive sleep apnea (adult) (pediatric): Secondary | ICD-10-CM | POA: Diagnosis not present

## 2022-01-23 DIAGNOSIS — Z7982 Long term (current) use of aspirin: Secondary | ICD-10-CM | POA: Diagnosis not present

## 2022-01-23 DIAGNOSIS — F32A Depression, unspecified: Secondary | ICD-10-CM | POA: Diagnosis not present

## 2022-01-23 DIAGNOSIS — E1165 Type 2 diabetes mellitus with hyperglycemia: Secondary | ICD-10-CM | POA: Diagnosis not present

## 2022-01-23 DIAGNOSIS — J9601 Acute respiratory failure with hypoxia: Secondary | ICD-10-CM | POA: Diagnosis not present

## 2022-01-23 DIAGNOSIS — E1122 Type 2 diabetes mellitus with diabetic chronic kidney disease: Secondary | ICD-10-CM | POA: Diagnosis not present

## 2022-01-23 DIAGNOSIS — N186 End stage renal disease: Secondary | ICD-10-CM | POA: Diagnosis not present

## 2022-01-23 DIAGNOSIS — Z9989 Dependence on other enabling machines and devices: Secondary | ICD-10-CM | POA: Diagnosis not present

## 2022-01-23 DIAGNOSIS — N2581 Secondary hyperparathyroidism of renal origin: Secondary | ICD-10-CM | POA: Diagnosis not present

## 2022-01-23 DIAGNOSIS — Z79899 Other long term (current) drug therapy: Secondary | ICD-10-CM | POA: Diagnosis not present

## 2022-01-23 DIAGNOSIS — Z992 Dependence on renal dialysis: Secondary | ICD-10-CM | POA: Diagnosis not present

## 2022-01-23 DIAGNOSIS — E875 Hyperkalemia: Secondary | ICD-10-CM | POA: Diagnosis not present

## 2022-01-23 DIAGNOSIS — D509 Iron deficiency anemia, unspecified: Secondary | ICD-10-CM | POA: Diagnosis not present

## 2022-01-23 DIAGNOSIS — D631 Anemia in chronic kidney disease: Secondary | ICD-10-CM | POA: Diagnosis not present

## 2022-01-23 DIAGNOSIS — Z4932 Encounter for adequacy testing for peritoneal dialysis: Secondary | ICD-10-CM | POA: Diagnosis not present

## 2022-01-23 DIAGNOSIS — Z9181 History of falling: Secondary | ICD-10-CM | POA: Diagnosis not present

## 2022-01-24 DIAGNOSIS — I120 Hypertensive chronic kidney disease with stage 5 chronic kidney disease or end stage renal disease: Secondary | ICD-10-CM | POA: Diagnosis not present

## 2022-01-24 DIAGNOSIS — E1122 Type 2 diabetes mellitus with diabetic chronic kidney disease: Secondary | ICD-10-CM | POA: Diagnosis not present

## 2022-01-24 DIAGNOSIS — Z9989 Dependence on other enabling machines and devices: Secondary | ICD-10-CM | POA: Diagnosis not present

## 2022-01-24 DIAGNOSIS — Z79899 Other long term (current) drug therapy: Secondary | ICD-10-CM | POA: Diagnosis not present

## 2022-01-24 DIAGNOSIS — Z992 Dependence on renal dialysis: Secondary | ICD-10-CM | POA: Diagnosis not present

## 2022-01-24 DIAGNOSIS — G4733 Obstructive sleep apnea (adult) (pediatric): Secondary | ICD-10-CM | POA: Diagnosis not present

## 2022-01-24 DIAGNOSIS — Z4932 Encounter for adequacy testing for peritoneal dialysis: Secondary | ICD-10-CM | POA: Diagnosis not present

## 2022-01-24 DIAGNOSIS — N186 End stage renal disease: Secondary | ICD-10-CM | POA: Diagnosis not present

## 2022-01-24 DIAGNOSIS — D631 Anemia in chronic kidney disease: Secondary | ICD-10-CM | POA: Diagnosis not present

## 2022-01-24 DIAGNOSIS — E785 Hyperlipidemia, unspecified: Secondary | ICD-10-CM | POA: Diagnosis not present

## 2022-01-24 DIAGNOSIS — J9601 Acute respiratory failure with hypoxia: Secondary | ICD-10-CM | POA: Diagnosis not present

## 2022-01-24 DIAGNOSIS — Z794 Long term (current) use of insulin: Secondary | ICD-10-CM | POA: Diagnosis not present

## 2022-01-24 DIAGNOSIS — Z9181 History of falling: Secondary | ICD-10-CM | POA: Diagnosis not present

## 2022-01-24 DIAGNOSIS — D509 Iron deficiency anemia, unspecified: Secondary | ICD-10-CM | POA: Diagnosis not present

## 2022-01-24 DIAGNOSIS — F32A Depression, unspecified: Secondary | ICD-10-CM | POA: Diagnosis not present

## 2022-01-24 DIAGNOSIS — E1165 Type 2 diabetes mellitus with hyperglycemia: Secondary | ICD-10-CM | POA: Diagnosis not present

## 2022-01-24 DIAGNOSIS — E875 Hyperkalemia: Secondary | ICD-10-CM | POA: Diagnosis not present

## 2022-01-24 DIAGNOSIS — R296 Repeated falls: Secondary | ICD-10-CM | POA: Diagnosis not present

## 2022-01-24 DIAGNOSIS — R82998 Other abnormal findings in urine: Secondary | ICD-10-CM | POA: Diagnosis not present

## 2022-01-24 DIAGNOSIS — A403 Sepsis due to Streptococcus pneumoniae: Secondary | ICD-10-CM | POA: Diagnosis not present

## 2022-01-24 DIAGNOSIS — N2581 Secondary hyperparathyroidism of renal origin: Secondary | ICD-10-CM | POA: Diagnosis not present

## 2022-01-24 DIAGNOSIS — Z7982 Long term (current) use of aspirin: Secondary | ICD-10-CM | POA: Diagnosis not present

## 2022-01-25 DIAGNOSIS — E875 Hyperkalemia: Secondary | ICD-10-CM | POA: Diagnosis not present

## 2022-01-25 DIAGNOSIS — D509 Iron deficiency anemia, unspecified: Secondary | ICD-10-CM | POA: Diagnosis not present

## 2022-01-25 DIAGNOSIS — D631 Anemia in chronic kidney disease: Secondary | ICD-10-CM | POA: Diagnosis not present

## 2022-01-25 DIAGNOSIS — N2581 Secondary hyperparathyroidism of renal origin: Secondary | ICD-10-CM | POA: Diagnosis not present

## 2022-01-25 DIAGNOSIS — Z4932 Encounter for adequacy testing for peritoneal dialysis: Secondary | ICD-10-CM | POA: Diagnosis not present

## 2022-01-25 DIAGNOSIS — N186 End stage renal disease: Secondary | ICD-10-CM | POA: Diagnosis not present

## 2022-01-25 DIAGNOSIS — Z992 Dependence on renal dialysis: Secondary | ICD-10-CM | POA: Diagnosis not present

## 2022-01-25 DIAGNOSIS — R82998 Other abnormal findings in urine: Secondary | ICD-10-CM | POA: Diagnosis not present

## 2022-01-25 DIAGNOSIS — Z79899 Other long term (current) drug therapy: Secondary | ICD-10-CM | POA: Diagnosis not present

## 2022-01-26 DIAGNOSIS — D509 Iron deficiency anemia, unspecified: Secondary | ICD-10-CM | POA: Diagnosis not present

## 2022-01-26 DIAGNOSIS — Z4932 Encounter for adequacy testing for peritoneal dialysis: Secondary | ICD-10-CM | POA: Diagnosis not present

## 2022-01-26 DIAGNOSIS — R82998 Other abnormal findings in urine: Secondary | ICD-10-CM | POA: Diagnosis not present

## 2022-01-26 DIAGNOSIS — Z992 Dependence on renal dialysis: Secondary | ICD-10-CM | POA: Diagnosis not present

## 2022-01-26 DIAGNOSIS — N2581 Secondary hyperparathyroidism of renal origin: Secondary | ICD-10-CM | POA: Diagnosis not present

## 2022-01-26 DIAGNOSIS — E875 Hyperkalemia: Secondary | ICD-10-CM | POA: Diagnosis not present

## 2022-01-26 DIAGNOSIS — D631 Anemia in chronic kidney disease: Secondary | ICD-10-CM | POA: Diagnosis not present

## 2022-01-26 DIAGNOSIS — Z79899 Other long term (current) drug therapy: Secondary | ICD-10-CM | POA: Diagnosis not present

## 2022-01-26 DIAGNOSIS — N186 End stage renal disease: Secondary | ICD-10-CM | POA: Diagnosis not present

## 2022-01-27 DIAGNOSIS — R82998 Other abnormal findings in urine: Secondary | ICD-10-CM | POA: Diagnosis not present

## 2022-01-27 DIAGNOSIS — Z79899 Other long term (current) drug therapy: Secondary | ICD-10-CM | POA: Diagnosis not present

## 2022-01-27 DIAGNOSIS — N2581 Secondary hyperparathyroidism of renal origin: Secondary | ICD-10-CM | POA: Diagnosis not present

## 2022-01-27 DIAGNOSIS — E875 Hyperkalemia: Secondary | ICD-10-CM | POA: Diagnosis not present

## 2022-01-27 DIAGNOSIS — Z4932 Encounter for adequacy testing for peritoneal dialysis: Secondary | ICD-10-CM | POA: Diagnosis not present

## 2022-01-27 DIAGNOSIS — D509 Iron deficiency anemia, unspecified: Secondary | ICD-10-CM | POA: Diagnosis not present

## 2022-01-27 DIAGNOSIS — Z992 Dependence on renal dialysis: Secondary | ICD-10-CM | POA: Diagnosis not present

## 2022-01-27 DIAGNOSIS — N186 End stage renal disease: Secondary | ICD-10-CM | POA: Diagnosis not present

## 2022-01-27 DIAGNOSIS — D631 Anemia in chronic kidney disease: Secondary | ICD-10-CM | POA: Diagnosis not present

## 2022-01-28 DIAGNOSIS — Z4932 Encounter for adequacy testing for peritoneal dialysis: Secondary | ICD-10-CM | POA: Diagnosis not present

## 2022-01-28 DIAGNOSIS — E875 Hyperkalemia: Secondary | ICD-10-CM | POA: Diagnosis not present

## 2022-01-28 DIAGNOSIS — Z992 Dependence on renal dialysis: Secondary | ICD-10-CM | POA: Diagnosis not present

## 2022-01-28 DIAGNOSIS — R82998 Other abnormal findings in urine: Secondary | ICD-10-CM | POA: Diagnosis not present

## 2022-01-28 DIAGNOSIS — D631 Anemia in chronic kidney disease: Secondary | ICD-10-CM | POA: Diagnosis not present

## 2022-01-28 DIAGNOSIS — N186 End stage renal disease: Secondary | ICD-10-CM | POA: Diagnosis not present

## 2022-01-28 DIAGNOSIS — D509 Iron deficiency anemia, unspecified: Secondary | ICD-10-CM | POA: Diagnosis not present

## 2022-01-28 DIAGNOSIS — N2581 Secondary hyperparathyroidism of renal origin: Secondary | ICD-10-CM | POA: Diagnosis not present

## 2022-01-28 DIAGNOSIS — Z79899 Other long term (current) drug therapy: Secondary | ICD-10-CM | POA: Diagnosis not present

## 2022-01-29 DIAGNOSIS — D631 Anemia in chronic kidney disease: Secondary | ICD-10-CM | POA: Diagnosis not present

## 2022-01-29 DIAGNOSIS — Z992 Dependence on renal dialysis: Secondary | ICD-10-CM | POA: Diagnosis not present

## 2022-01-29 DIAGNOSIS — R82998 Other abnormal findings in urine: Secondary | ICD-10-CM | POA: Diagnosis not present

## 2022-01-29 DIAGNOSIS — D509 Iron deficiency anemia, unspecified: Secondary | ICD-10-CM | POA: Diagnosis not present

## 2022-01-29 DIAGNOSIS — E875 Hyperkalemia: Secondary | ICD-10-CM | POA: Diagnosis not present

## 2022-01-29 DIAGNOSIS — Z79899 Other long term (current) drug therapy: Secondary | ICD-10-CM | POA: Diagnosis not present

## 2022-01-29 DIAGNOSIS — Z4932 Encounter for adequacy testing for peritoneal dialysis: Secondary | ICD-10-CM | POA: Diagnosis not present

## 2022-01-29 DIAGNOSIS — N186 End stage renal disease: Secondary | ICD-10-CM | POA: Diagnosis not present

## 2022-01-29 DIAGNOSIS — N2581 Secondary hyperparathyroidism of renal origin: Secondary | ICD-10-CM | POA: Diagnosis not present

## 2022-01-30 DIAGNOSIS — Z9181 History of falling: Secondary | ICD-10-CM | POA: Diagnosis not present

## 2022-01-30 DIAGNOSIS — A403 Sepsis due to Streptococcus pneumoniae: Secondary | ICD-10-CM | POA: Diagnosis not present

## 2022-01-30 DIAGNOSIS — E1122 Type 2 diabetes mellitus with diabetic chronic kidney disease: Secondary | ICD-10-CM | POA: Diagnosis not present

## 2022-01-30 DIAGNOSIS — G4733 Obstructive sleep apnea (adult) (pediatric): Secondary | ICD-10-CM | POA: Diagnosis not present

## 2022-01-30 DIAGNOSIS — N186 End stage renal disease: Secondary | ICD-10-CM | POA: Diagnosis not present

## 2022-01-30 DIAGNOSIS — Z992 Dependence on renal dialysis: Secondary | ICD-10-CM | POA: Diagnosis not present

## 2022-01-30 DIAGNOSIS — E440 Moderate protein-calorie malnutrition: Secondary | ICD-10-CM | POA: Diagnosis not present

## 2022-01-30 DIAGNOSIS — N2589 Other disorders resulting from impaired renal tubular function: Secondary | ICD-10-CM | POA: Diagnosis not present

## 2022-01-30 DIAGNOSIS — D631 Anemia in chronic kidney disease: Secondary | ICD-10-CM | POA: Diagnosis not present

## 2022-01-30 DIAGNOSIS — E785 Hyperlipidemia, unspecified: Secondary | ICD-10-CM | POA: Diagnosis not present

## 2022-01-30 DIAGNOSIS — N2581 Secondary hyperparathyroidism of renal origin: Secondary | ICD-10-CM | POA: Diagnosis not present

## 2022-01-30 DIAGNOSIS — E1129 Type 2 diabetes mellitus with other diabetic kidney complication: Secondary | ICD-10-CM | POA: Diagnosis not present

## 2022-01-30 DIAGNOSIS — J9601 Acute respiratory failure with hypoxia: Secondary | ICD-10-CM | POA: Diagnosis not present

## 2022-01-30 DIAGNOSIS — R82998 Other abnormal findings in urine: Secondary | ICD-10-CM | POA: Diagnosis not present

## 2022-01-30 DIAGNOSIS — D509 Iron deficiency anemia, unspecified: Secondary | ICD-10-CM | POA: Diagnosis not present

## 2022-01-30 DIAGNOSIS — Z794 Long term (current) use of insulin: Secondary | ICD-10-CM | POA: Diagnosis not present

## 2022-01-30 DIAGNOSIS — Z9989 Dependence on other enabling machines and devices: Secondary | ICD-10-CM | POA: Diagnosis not present

## 2022-01-30 DIAGNOSIS — I120 Hypertensive chronic kidney disease with stage 5 chronic kidney disease or end stage renal disease: Secondary | ICD-10-CM | POA: Diagnosis not present

## 2022-01-30 DIAGNOSIS — R296 Repeated falls: Secondary | ICD-10-CM | POA: Diagnosis not present

## 2022-01-30 DIAGNOSIS — Z7982 Long term (current) use of aspirin: Secondary | ICD-10-CM | POA: Diagnosis not present

## 2022-01-30 DIAGNOSIS — E1165 Type 2 diabetes mellitus with hyperglycemia: Secondary | ICD-10-CM | POA: Diagnosis not present

## 2022-01-30 DIAGNOSIS — F32A Depression, unspecified: Secondary | ICD-10-CM | POA: Diagnosis not present

## 2022-01-31 ENCOUNTER — Ambulatory Visit: Payer: Medicare Other | Admitting: Family Medicine

## 2022-01-31 DIAGNOSIS — Z992 Dependence on renal dialysis: Secondary | ICD-10-CM | POA: Diagnosis not present

## 2022-01-31 DIAGNOSIS — N186 End stage renal disease: Secondary | ICD-10-CM | POA: Diagnosis not present

## 2022-01-31 DIAGNOSIS — D509 Iron deficiency anemia, unspecified: Secondary | ICD-10-CM | POA: Diagnosis not present

## 2022-01-31 DIAGNOSIS — E1129 Type 2 diabetes mellitus with other diabetic kidney complication: Secondary | ICD-10-CM | POA: Diagnosis not present

## 2022-01-31 DIAGNOSIS — N2581 Secondary hyperparathyroidism of renal origin: Secondary | ICD-10-CM | POA: Diagnosis not present

## 2022-01-31 DIAGNOSIS — N2589 Other disorders resulting from impaired renal tubular function: Secondary | ICD-10-CM | POA: Diagnosis not present

## 2022-01-31 DIAGNOSIS — R82998 Other abnormal findings in urine: Secondary | ICD-10-CM | POA: Diagnosis not present

## 2022-01-31 DIAGNOSIS — E440 Moderate protein-calorie malnutrition: Secondary | ICD-10-CM | POA: Diagnosis not present

## 2022-01-31 DIAGNOSIS — D631 Anemia in chronic kidney disease: Secondary | ICD-10-CM | POA: Diagnosis not present

## 2022-02-01 DIAGNOSIS — N186 End stage renal disease: Secondary | ICD-10-CM | POA: Diagnosis not present

## 2022-02-01 DIAGNOSIS — E1129 Type 2 diabetes mellitus with other diabetic kidney complication: Secondary | ICD-10-CM | POA: Diagnosis not present

## 2022-02-01 DIAGNOSIS — N2581 Secondary hyperparathyroidism of renal origin: Secondary | ICD-10-CM | POA: Diagnosis not present

## 2022-02-01 DIAGNOSIS — D631 Anemia in chronic kidney disease: Secondary | ICD-10-CM | POA: Diagnosis not present

## 2022-02-01 DIAGNOSIS — D509 Iron deficiency anemia, unspecified: Secondary | ICD-10-CM | POA: Diagnosis not present

## 2022-02-01 DIAGNOSIS — R82998 Other abnormal findings in urine: Secondary | ICD-10-CM | POA: Diagnosis not present

## 2022-02-01 DIAGNOSIS — Z992 Dependence on renal dialysis: Secondary | ICD-10-CM | POA: Diagnosis not present

## 2022-02-01 DIAGNOSIS — N2589 Other disorders resulting from impaired renal tubular function: Secondary | ICD-10-CM | POA: Diagnosis not present

## 2022-02-01 DIAGNOSIS — E440 Moderate protein-calorie malnutrition: Secondary | ICD-10-CM | POA: Diagnosis not present

## 2022-02-02 DIAGNOSIS — D631 Anemia in chronic kidney disease: Secondary | ICD-10-CM | POA: Diagnosis not present

## 2022-02-02 DIAGNOSIS — D509 Iron deficiency anemia, unspecified: Secondary | ICD-10-CM | POA: Diagnosis not present

## 2022-02-02 DIAGNOSIS — N2589 Other disorders resulting from impaired renal tubular function: Secondary | ICD-10-CM | POA: Diagnosis not present

## 2022-02-02 DIAGNOSIS — N2581 Secondary hyperparathyroidism of renal origin: Secondary | ICD-10-CM | POA: Diagnosis not present

## 2022-02-02 DIAGNOSIS — Z992 Dependence on renal dialysis: Secondary | ICD-10-CM | POA: Diagnosis not present

## 2022-02-02 DIAGNOSIS — R82998 Other abnormal findings in urine: Secondary | ICD-10-CM | POA: Diagnosis not present

## 2022-02-02 DIAGNOSIS — E440 Moderate protein-calorie malnutrition: Secondary | ICD-10-CM | POA: Diagnosis not present

## 2022-02-02 DIAGNOSIS — N186 End stage renal disease: Secondary | ICD-10-CM | POA: Diagnosis not present

## 2022-02-02 DIAGNOSIS — E1129 Type 2 diabetes mellitus with other diabetic kidney complication: Secondary | ICD-10-CM | POA: Diagnosis not present

## 2022-02-02 DIAGNOSIS — G4733 Obstructive sleep apnea (adult) (pediatric): Secondary | ICD-10-CM | POA: Diagnosis not present

## 2022-02-03 DIAGNOSIS — Z992 Dependence on renal dialysis: Secondary | ICD-10-CM | POA: Diagnosis not present

## 2022-02-03 DIAGNOSIS — E1129 Type 2 diabetes mellitus with other diabetic kidney complication: Secondary | ICD-10-CM | POA: Diagnosis not present

## 2022-02-03 DIAGNOSIS — N2581 Secondary hyperparathyroidism of renal origin: Secondary | ICD-10-CM | POA: Diagnosis not present

## 2022-02-03 DIAGNOSIS — E440 Moderate protein-calorie malnutrition: Secondary | ICD-10-CM | POA: Diagnosis not present

## 2022-02-03 DIAGNOSIS — D509 Iron deficiency anemia, unspecified: Secondary | ICD-10-CM | POA: Diagnosis not present

## 2022-02-03 DIAGNOSIS — N2589 Other disorders resulting from impaired renal tubular function: Secondary | ICD-10-CM | POA: Diagnosis not present

## 2022-02-03 DIAGNOSIS — D631 Anemia in chronic kidney disease: Secondary | ICD-10-CM | POA: Diagnosis not present

## 2022-02-03 DIAGNOSIS — N186 End stage renal disease: Secondary | ICD-10-CM | POA: Diagnosis not present

## 2022-02-03 DIAGNOSIS — R82998 Other abnormal findings in urine: Secondary | ICD-10-CM | POA: Diagnosis not present

## 2022-02-04 DIAGNOSIS — D631 Anemia in chronic kidney disease: Secondary | ICD-10-CM | POA: Diagnosis not present

## 2022-02-04 DIAGNOSIS — N2589 Other disorders resulting from impaired renal tubular function: Secondary | ICD-10-CM | POA: Diagnosis not present

## 2022-02-04 DIAGNOSIS — R82998 Other abnormal findings in urine: Secondary | ICD-10-CM | POA: Diagnosis not present

## 2022-02-04 DIAGNOSIS — Z992 Dependence on renal dialysis: Secondary | ICD-10-CM | POA: Diagnosis not present

## 2022-02-04 DIAGNOSIS — E1129 Type 2 diabetes mellitus with other diabetic kidney complication: Secondary | ICD-10-CM | POA: Diagnosis not present

## 2022-02-04 DIAGNOSIS — N186 End stage renal disease: Secondary | ICD-10-CM | POA: Diagnosis not present

## 2022-02-04 DIAGNOSIS — E440 Moderate protein-calorie malnutrition: Secondary | ICD-10-CM | POA: Diagnosis not present

## 2022-02-04 DIAGNOSIS — N2581 Secondary hyperparathyroidism of renal origin: Secondary | ICD-10-CM | POA: Diagnosis not present

## 2022-02-04 DIAGNOSIS — D509 Iron deficiency anemia, unspecified: Secondary | ICD-10-CM | POA: Diagnosis not present

## 2022-02-05 ENCOUNTER — Telehealth (INDEPENDENT_AMBULATORY_CARE_PROVIDER_SITE_OTHER): Payer: Medicare Other | Admitting: Endocrinology

## 2022-02-05 ENCOUNTER — Other Ambulatory Visit: Payer: Self-pay

## 2022-02-05 VITALS — BP 120/74 | Ht 67.0 in | Wt 184.0 lb

## 2022-02-05 DIAGNOSIS — D631 Anemia in chronic kidney disease: Secondary | ICD-10-CM | POA: Diagnosis not present

## 2022-02-05 DIAGNOSIS — D509 Iron deficiency anemia, unspecified: Secondary | ICD-10-CM | POA: Diagnosis not present

## 2022-02-05 DIAGNOSIS — E1129 Type 2 diabetes mellitus with other diabetic kidney complication: Secondary | ICD-10-CM | POA: Diagnosis not present

## 2022-02-05 DIAGNOSIS — Z9989 Dependence on other enabling machines and devices: Secondary | ICD-10-CM | POA: Diagnosis not present

## 2022-02-05 DIAGNOSIS — E118 Type 2 diabetes mellitus with unspecified complications: Secondary | ICD-10-CM | POA: Diagnosis not present

## 2022-02-05 DIAGNOSIS — Z7982 Long term (current) use of aspirin: Secondary | ICD-10-CM | POA: Diagnosis not present

## 2022-02-05 DIAGNOSIS — A403 Sepsis due to Streptococcus pneumoniae: Secondary | ICD-10-CM | POA: Diagnosis not present

## 2022-02-05 DIAGNOSIS — Z992 Dependence on renal dialysis: Secondary | ICD-10-CM | POA: Diagnosis not present

## 2022-02-05 DIAGNOSIS — N186 End stage renal disease: Secondary | ICD-10-CM | POA: Diagnosis not present

## 2022-02-05 DIAGNOSIS — J9601 Acute respiratory failure with hypoxia: Secondary | ICD-10-CM | POA: Diagnosis not present

## 2022-02-05 DIAGNOSIS — R82998 Other abnormal findings in urine: Secondary | ICD-10-CM | POA: Diagnosis not present

## 2022-02-05 DIAGNOSIS — E785 Hyperlipidemia, unspecified: Secondary | ICD-10-CM | POA: Diagnosis not present

## 2022-02-05 DIAGNOSIS — I120 Hypertensive chronic kidney disease with stage 5 chronic kidney disease or end stage renal disease: Secondary | ICD-10-CM | POA: Diagnosis not present

## 2022-02-05 DIAGNOSIS — Z794 Long term (current) use of insulin: Secondary | ICD-10-CM | POA: Diagnosis not present

## 2022-02-05 DIAGNOSIS — F32A Depression, unspecified: Secondary | ICD-10-CM | POA: Diagnosis not present

## 2022-02-05 DIAGNOSIS — N2581 Secondary hyperparathyroidism of renal origin: Secondary | ICD-10-CM | POA: Diagnosis not present

## 2022-02-05 DIAGNOSIS — E1122 Type 2 diabetes mellitus with diabetic chronic kidney disease: Secondary | ICD-10-CM | POA: Diagnosis not present

## 2022-02-05 DIAGNOSIS — N2589 Other disorders resulting from impaired renal tubular function: Secondary | ICD-10-CM | POA: Diagnosis not present

## 2022-02-05 DIAGNOSIS — E1165 Type 2 diabetes mellitus with hyperglycemia: Secondary | ICD-10-CM | POA: Diagnosis not present

## 2022-02-05 DIAGNOSIS — R296 Repeated falls: Secondary | ICD-10-CM | POA: Diagnosis not present

## 2022-02-05 DIAGNOSIS — Z9181 History of falling: Secondary | ICD-10-CM | POA: Diagnosis not present

## 2022-02-05 DIAGNOSIS — E440 Moderate protein-calorie malnutrition: Secondary | ICD-10-CM | POA: Diagnosis not present

## 2022-02-05 DIAGNOSIS — G4733 Obstructive sleep apnea (adult) (pediatric): Secondary | ICD-10-CM | POA: Diagnosis not present

## 2022-02-05 MED ORDER — GLIPIZIDE 5 MG PO TABS: 2.5000 mg | ORAL_TABLET | Freq: Every day | ORAL | 3 refills | Status: DC

## 2022-02-05 NOTE — Progress Notes (Signed)
? ?Subjective:  ? ? Patient ID: Felicia Thompson, female    DOB: [birth year], 76 y.o.   MRN: [id number] ? 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   ?Pancreatitis: (GB) in approx 2000.   ?Pancreatic imaging: none known ?SDOH: dtr cares for her, and provides some hx, due to pt's memory loss; She says meds are too expensive.   ?Other: she takes overnight PD; She stopped using continuous glucose monitor; she eats meals at 1PM, 7PM.  She sometimes eats in the evening, but not always.   ?Interval history: no cbg record, but pt says cbg varies from 40-320.  No recent blood transfusion.  She has mild hypoglycemia approx 2/month.  This happens in the afternoon.  Nephrol changed the insulin to with the evening meal.  Cbg is highest at HS.   ?Past Medical History:  ?Diagnosis Date  ? Arthritis   ? Blood in stool   ? when appendix burst  ? Chronic kidney disease   ? Depression   ? DM (diabetes mellitus), type 2 with complications ([place])   ? ESRD on peritoneal dialysis [facility])   ? History of fainting spells of unknown cause   ? Hyperlipidemia   ? Hyperlipidemia associated with type 2 diabetes mellitus ([place])   ? Hypertension   ? OSA (obstructive sleep apnea)   ? ? ?Past Surgical History:  ?Procedure Laterality Date  ? ABDOMINAL HYSTERECTOMY    ? APPENDECTOMY    ? CHOLECYSTECTOMY    ? TONSILECTOMY, ADENOIDECTOMY, BILATERAL MYRINGOTOMY AND TUBES    ? ? ?Social History  ? ?Socioeconomic History  ? Marital status: Widowed  ?  Spouse name: Not on file  ? Number of children: 4  ? Years of education: Not on file  ? Highest education level: Master's degree (e.g., MA, MS, MEng, MEd, MSW, MBA)  ?Occupational History  ? Not on file  ?Tobacco Use  ? Smoking status: Never  ? Smokeless tobacco: Never  ?Substance and Sexual Activity  ? Alcohol use: Never  ? Drug use: Never   ? Sexual activity: Not Currently  ?Other Topics Concern  ? Not on file  ?Social History Narrative  ? Lives with daughter and son in law and 2 children  ? Right handed   ? Caffeine: 3 cup a week  ? ?Social Determinants of Health  ? ?Financial Resource Strain: Low Risk   ? Difficulty of Paying Living Expenses: Not hard at all  ?Food Insecurity: No Food Insecurity  ? Worried About Charity fundraiser in the Last Year: Never true  ? Ran Out of Food in the Last Year: Never true  ?Transportation Needs: No Transportation Needs  ? Lack of Transportation (Medical): No  ? Lack of Transportation (Non-Medical): No  ?Physical Activity: Unknown  ? Days of Exercise per Week: 0 days  ? Minutes of Exercise per Session: Not on file  ?Stress: No Stress Concern Present  ? Feeling of Stress : Not at all  ?Social Connections: Moderately Isolated  ? Frequency of Communication with Friends and Family: More than three times a week  ? Frequency of Social Gatherings with Friends and Family: Never  ? Attends Religious Services: 1 to 4 times per year  ? Active Member of Clubs or Organizations: No  ? Attends Archivist Meetings: Not on file  ? Marital Status: Widowed  ?  Intimate Partner Violence: Not on file  ? ? ?Current Outpatient Medications on File Prior to Visit  ?Medication Sig Dispense Refill  ? aspirin 81 MG chewable tablet Chew 81 mg by mouth daily.    ? benzonatate (TESSALON PERLES) 100 MG capsule 1-2 capsules as needed cough 30 capsule 0  ? carvedilol (COREG) 25 MG tablet Take 25 mg by mouth 2 (two) times daily with a meal.    ? Continuous Blood Gluc Sensor (FREESTYLE LIBRE 2 SENSOR) MISC 1 Device by Does not apply route every 14 (fourteen) days. 6 each 3  ? insulin NPH-regular Human (HUMULIN 70/30) (70-30) 100 UNIT/ML injection Inject 10 Units into the skin daily with breakfast. 30 mL 11  ? losartan (COZAAR) 50 MG tablet Take 1 tablet (50 mg total) by mouth daily. 30 tablet 3  ? Multiple Vitamins-Minerals (RENAPLEX) TABS  Take 1 tablet by mouth daily.    ? Probiotic Product ([employer]S ADV PROBIOTIC GUMMIES PO) Take 1 tablet by mouth at bedtime.    ? sevelamer carbonate (RENVELA) 800 MG tablet Take 2,400 mg by mouth 3 (three) times daily with meals.    ? venlafaxine XR (EFFEXOR-XR) 150 MG 24 hr capsule Take 150 mg by mouth daily with breakfast.    ? atorvastatin (LIPITOR) 80 MG tablet TAKE ONE TABLET BY MOUTH DAILY (Patient not taking: Reported on 02/05/2022) 90 tablet 1  ? ?No current facility-administered medications on file prior to visit.  ? ? ?Allergies  ?Allergen Reactions  ? Tape Other (See Comments)  ?  Blisters  ? Tetracyclines & Related Rash  ? ? ?Family History  ?Problem Relation Age of Onset  ? Hearing loss Father   ? Hyperlipidemia Father   ? Heart disease Father   ? Early death Father   ? Cancer Father   ? Early death Brother   ? Drug abuse Brother   ? Diabetes Brother   ? Depression Brother   ? Alcohol abuse Brother   ? Miscarriages / [place] Daughter   ? Arthritis Daughter   ? Depression Daughter   ? Early death Maternal Grandmother   ? Heart attack Maternal Grandmother   ? Heart disease Maternal Grandmother   ? Hearing loss Paternal Grandmother   ? Heart disease Paternal Grandmother   ? Heart disease Paternal Grandfather   ? Early death Paternal Grandfather   ? Heart attack Paternal Grandfather   ? ? ?BP 120/74 Comment: pt reported  Ht 5\' 7"  (1.702 m)   Wt 184 lb (83.5 kg)   BMI 28.82 kg/m?  ? ? ?Review of Systems ? ?   ?Objective:  ? Physical Exam ? ? ? ?Lab Results  ?Component Value Date  ? CREATININE 6.81 (H) 12/18/2021  ? BUN 54 (H) 12/18/2021  ? NA 138 12/18/2021  ? K 3.9 12/18/2021  ? CL 102 12/18/2021  ? CO2 27 12/18/2021  ? ?Lab Results  ?Component Value Date  ? HGBA1C 6.8 (H) 12/17/2021  ? ?   ?Assessment & Plan:  ?Insulin-requiring type 2 DM: overcontrolled. ? ?Patient Instructions  ?check your blood sugar twice a day.  vary the time of day when you check, between before the 3 meals, and at bedtime.  also  check if you have symptoms of your blood sugar being too high or too low.  please keep a record of the readings and bring it to your next appointment here (or you can bring the meter itself).  You can write it on any piece of paper.  please call us sooner if your blood sugar goes below 70, or if most of your readings are over 200.   ?We will need to take this complex situation in stages.   ?I have sent a prescription to your pharmacy, to reduce the glipizide to 1/2 pill with breakfast only, and:  ?Please continue the same insulin.   ?On this type of insulin schedule, you should eat meals on a regular schedule.  If a meal is missed or significantly delayed, your blood sugar could go low.    ?Please come back for a follow-up appointment in 1 month.   ? ? ?

## 2022-02-05 NOTE — Patient Instructions (Addendum)
check your blood sugar twice a day.  vary the time of day when you check, between before the 3 meals, and at bedtime.  also check if you have symptoms of your blood sugar being too high or too low.  please keep a record of the readings and bring it to your next appointment here (or you can bring the meter itself).  You can write it on any piece of paper.  please call us sooner if your blood sugar goes below 70, or if most of your readings are over 200.   ?We will need to take this complex situation in stages.   ?I have sent a prescription to your pharmacy, to reduce the glipizide to 1/2 pill with breakfast only, and:  ?Please continue the same insulin.   ?On this type of insulin schedule, you should eat meals on a regular schedule.  If a meal is missed or significantly delayed, your blood sugar could go low.    ?Please come back for a follow-up appointment in 1 month.   ?

## 2022-02-06 DIAGNOSIS — E1165 Type 2 diabetes mellitus with hyperglycemia: Secondary | ICD-10-CM | POA: Diagnosis not present

## 2022-02-06 DIAGNOSIS — Z9181 History of falling: Secondary | ICD-10-CM | POA: Diagnosis not present

## 2022-02-06 DIAGNOSIS — F32A Depression, unspecified: Secondary | ICD-10-CM | POA: Diagnosis not present

## 2022-02-06 DIAGNOSIS — E1122 Type 2 diabetes mellitus with diabetic chronic kidney disease: Secondary | ICD-10-CM | POA: Diagnosis not present

## 2022-02-06 DIAGNOSIS — E785 Hyperlipidemia, unspecified: Secondary | ICD-10-CM | POA: Diagnosis not present

## 2022-02-06 DIAGNOSIS — E1129 Type 2 diabetes mellitus with other diabetic kidney complication: Secondary | ICD-10-CM | POA: Diagnosis not present

## 2022-02-06 DIAGNOSIS — N186 End stage renal disease: Secondary | ICD-10-CM | POA: Diagnosis not present

## 2022-02-06 DIAGNOSIS — D631 Anemia in chronic kidney disease: Secondary | ICD-10-CM | POA: Diagnosis not present

## 2022-02-06 DIAGNOSIS — Z7982 Long term (current) use of aspirin: Secondary | ICD-10-CM | POA: Diagnosis not present

## 2022-02-06 DIAGNOSIS — J9601 Acute respiratory failure with hypoxia: Secondary | ICD-10-CM | POA: Diagnosis not present

## 2022-02-06 DIAGNOSIS — R82998 Other abnormal findings in urine: Secondary | ICD-10-CM | POA: Diagnosis not present

## 2022-02-06 DIAGNOSIS — N2589 Other disorders resulting from impaired renal tubular function: Secondary | ICD-10-CM | POA: Diagnosis not present

## 2022-02-06 DIAGNOSIS — N2581 Secondary hyperparathyroidism of renal origin: Secondary | ICD-10-CM | POA: Diagnosis not present

## 2022-02-06 DIAGNOSIS — R296 Repeated falls: Secondary | ICD-10-CM | POA: Diagnosis not present

## 2022-02-06 DIAGNOSIS — Z992 Dependence on renal dialysis: Secondary | ICD-10-CM | POA: Diagnosis not present

## 2022-02-06 DIAGNOSIS — G4733 Obstructive sleep apnea (adult) (pediatric): Secondary | ICD-10-CM | POA: Diagnosis not present

## 2022-02-06 DIAGNOSIS — A403 Sepsis due to Streptococcus pneumoniae: Secondary | ICD-10-CM | POA: Diagnosis not present

## 2022-02-06 DIAGNOSIS — I120 Hypertensive chronic kidney disease with stage 5 chronic kidney disease or end stage renal disease: Secondary | ICD-10-CM | POA: Diagnosis not present

## 2022-02-06 DIAGNOSIS — D509 Iron deficiency anemia, unspecified: Secondary | ICD-10-CM | POA: Diagnosis not present

## 2022-02-06 DIAGNOSIS — Z9989 Dependence on other enabling machines and devices: Secondary | ICD-10-CM | POA: Diagnosis not present

## 2022-02-06 DIAGNOSIS — Z794 Long term (current) use of insulin: Secondary | ICD-10-CM | POA: Diagnosis not present

## 2022-02-06 DIAGNOSIS — E440 Moderate protein-calorie malnutrition: Secondary | ICD-10-CM | POA: Diagnosis not present

## 2022-02-07 DIAGNOSIS — E440 Moderate protein-calorie malnutrition: Secondary | ICD-10-CM | POA: Diagnosis not present

## 2022-02-07 DIAGNOSIS — R82998 Other abnormal findings in urine: Secondary | ICD-10-CM | POA: Diagnosis not present

## 2022-02-07 DIAGNOSIS — Z992 Dependence on renal dialysis: Secondary | ICD-10-CM | POA: Diagnosis not present

## 2022-02-07 DIAGNOSIS — N186 End stage renal disease: Secondary | ICD-10-CM | POA: Diagnosis not present

## 2022-02-07 DIAGNOSIS — D509 Iron deficiency anemia, unspecified: Secondary | ICD-10-CM | POA: Diagnosis not present

## 2022-02-07 DIAGNOSIS — D631 Anemia in chronic kidney disease: Secondary | ICD-10-CM | POA: Diagnosis not present

## 2022-02-07 DIAGNOSIS — E1129 Type 2 diabetes mellitus with other diabetic kidney complication: Secondary | ICD-10-CM | POA: Diagnosis not present

## 2022-02-07 DIAGNOSIS — N2589 Other disorders resulting from impaired renal tubular function: Secondary | ICD-10-CM | POA: Diagnosis not present

## 2022-02-07 DIAGNOSIS — N2581 Secondary hyperparathyroidism of renal origin: Secondary | ICD-10-CM | POA: Diagnosis not present

## 2022-02-08 DIAGNOSIS — N2581 Secondary hyperparathyroidism of renal origin: Secondary | ICD-10-CM | POA: Diagnosis not present

## 2022-02-08 DIAGNOSIS — E440 Moderate protein-calorie malnutrition: Secondary | ICD-10-CM | POA: Diagnosis not present

## 2022-02-08 DIAGNOSIS — Z992 Dependence on renal dialysis: Secondary | ICD-10-CM | POA: Diagnosis not present

## 2022-02-08 DIAGNOSIS — E1129 Type 2 diabetes mellitus with other diabetic kidney complication: Secondary | ICD-10-CM | POA: Diagnosis not present

## 2022-02-08 DIAGNOSIS — D631 Anemia in chronic kidney disease: Secondary | ICD-10-CM | POA: Diagnosis not present

## 2022-02-08 DIAGNOSIS — N2589 Other disorders resulting from impaired renal tubular function: Secondary | ICD-10-CM | POA: Diagnosis not present

## 2022-02-08 DIAGNOSIS — R82998 Other abnormal findings in urine: Secondary | ICD-10-CM | POA: Diagnosis not present

## 2022-02-08 DIAGNOSIS — N186 End stage renal disease: Secondary | ICD-10-CM | POA: Diagnosis not present

## 2022-02-08 DIAGNOSIS — D509 Iron deficiency anemia, unspecified: Secondary | ICD-10-CM | POA: Diagnosis not present

## 2022-02-09 DIAGNOSIS — Z992 Dependence on renal dialysis: Secondary | ICD-10-CM | POA: Diagnosis not present

## 2022-02-09 DIAGNOSIS — N2581 Secondary hyperparathyroidism of renal origin: Secondary | ICD-10-CM | POA: Diagnosis not present

## 2022-02-09 DIAGNOSIS — R82998 Other abnormal findings in urine: Secondary | ICD-10-CM | POA: Diagnosis not present

## 2022-02-09 DIAGNOSIS — D631 Anemia in chronic kidney disease: Secondary | ICD-10-CM | POA: Diagnosis not present

## 2022-02-09 DIAGNOSIS — E1129 Type 2 diabetes mellitus with other diabetic kidney complication: Secondary | ICD-10-CM | POA: Diagnosis not present

## 2022-02-09 DIAGNOSIS — N2589 Other disorders resulting from impaired renal tubular function: Secondary | ICD-10-CM | POA: Diagnosis not present

## 2022-02-09 DIAGNOSIS — D509 Iron deficiency anemia, unspecified: Secondary | ICD-10-CM | POA: Diagnosis not present

## 2022-02-09 DIAGNOSIS — N186 End stage renal disease: Secondary | ICD-10-CM | POA: Diagnosis not present

## 2022-02-09 DIAGNOSIS — E440 Moderate protein-calorie malnutrition: Secondary | ICD-10-CM | POA: Diagnosis not present

## 2022-02-10 DIAGNOSIS — E1129 Type 2 diabetes mellitus with other diabetic kidney complication: Secondary | ICD-10-CM | POA: Diagnosis not present

## 2022-02-10 DIAGNOSIS — Z992 Dependence on renal dialysis: Secondary | ICD-10-CM | POA: Diagnosis not present

## 2022-02-10 DIAGNOSIS — D509 Iron deficiency anemia, unspecified: Secondary | ICD-10-CM | POA: Diagnosis not present

## 2022-02-10 DIAGNOSIS — E440 Moderate protein-calorie malnutrition: Secondary | ICD-10-CM | POA: Diagnosis not present

## 2022-02-10 DIAGNOSIS — N2581 Secondary hyperparathyroidism of renal origin: Secondary | ICD-10-CM | POA: Diagnosis not present

## 2022-02-10 DIAGNOSIS — D631 Anemia in chronic kidney disease: Secondary | ICD-10-CM | POA: Diagnosis not present

## 2022-02-10 DIAGNOSIS — N2589 Other disorders resulting from impaired renal tubular function: Secondary | ICD-10-CM | POA: Diagnosis not present

## 2022-02-10 DIAGNOSIS — R82998 Other abnormal findings in urine: Secondary | ICD-10-CM | POA: Diagnosis not present

## 2022-02-10 DIAGNOSIS — N186 End stage renal disease: Secondary | ICD-10-CM | POA: Diagnosis not present

## 2022-02-11 DIAGNOSIS — D631 Anemia in chronic kidney disease: Secondary | ICD-10-CM | POA: Diagnosis not present

## 2022-02-11 DIAGNOSIS — N2589 Other disorders resulting from impaired renal tubular function: Secondary | ICD-10-CM | POA: Diagnosis not present

## 2022-02-11 DIAGNOSIS — Z992 Dependence on renal dialysis: Secondary | ICD-10-CM | POA: Diagnosis not present

## 2022-02-11 DIAGNOSIS — D509 Iron deficiency anemia, unspecified: Secondary | ICD-10-CM | POA: Diagnosis not present

## 2022-02-11 DIAGNOSIS — N2581 Secondary hyperparathyroidism of renal origin: Secondary | ICD-10-CM | POA: Diagnosis not present

## 2022-02-11 DIAGNOSIS — E440 Moderate protein-calorie malnutrition: Secondary | ICD-10-CM | POA: Diagnosis not present

## 2022-02-11 DIAGNOSIS — E1129 Type 2 diabetes mellitus with other diabetic kidney complication: Secondary | ICD-10-CM | POA: Diagnosis not present

## 2022-02-11 DIAGNOSIS — R82998 Other abnormal findings in urine: Secondary | ICD-10-CM | POA: Diagnosis not present

## 2022-02-11 DIAGNOSIS — N186 End stage renal disease: Secondary | ICD-10-CM | POA: Diagnosis not present

## 2022-02-12 DIAGNOSIS — Z992 Dependence on renal dialysis: Secondary | ICD-10-CM | POA: Diagnosis not present

## 2022-02-12 DIAGNOSIS — R82998 Other abnormal findings in urine: Secondary | ICD-10-CM | POA: Diagnosis not present

## 2022-02-12 DIAGNOSIS — D631 Anemia in chronic kidney disease: Secondary | ICD-10-CM | POA: Diagnosis not present

## 2022-02-12 DIAGNOSIS — N2589 Other disorders resulting from impaired renal tubular function: Secondary | ICD-10-CM | POA: Diagnosis not present

## 2022-02-12 DIAGNOSIS — N186 End stage renal disease: Secondary | ICD-10-CM | POA: Diagnosis not present

## 2022-02-12 DIAGNOSIS — E440 Moderate protein-calorie malnutrition: Secondary | ICD-10-CM | POA: Diagnosis not present

## 2022-02-12 DIAGNOSIS — D509 Iron deficiency anemia, unspecified: Secondary | ICD-10-CM | POA: Diagnosis not present

## 2022-02-12 DIAGNOSIS — N2581 Secondary hyperparathyroidism of renal origin: Secondary | ICD-10-CM | POA: Diagnosis not present

## 2022-02-12 DIAGNOSIS — E1129 Type 2 diabetes mellitus with other diabetic kidney complication: Secondary | ICD-10-CM | POA: Diagnosis not present

## 2022-02-13 DIAGNOSIS — R82998 Other abnormal findings in urine: Secondary | ICD-10-CM | POA: Diagnosis not present

## 2022-02-13 DIAGNOSIS — Z992 Dependence on renal dialysis: Secondary | ICD-10-CM | POA: Diagnosis not present

## 2022-02-13 DIAGNOSIS — D631 Anemia in chronic kidney disease: Secondary | ICD-10-CM | POA: Diagnosis not present

## 2022-02-13 DIAGNOSIS — N2581 Secondary hyperparathyroidism of renal origin: Secondary | ICD-10-CM | POA: Diagnosis not present

## 2022-02-13 DIAGNOSIS — N2589 Other disorders resulting from impaired renal tubular function: Secondary | ICD-10-CM | POA: Diagnosis not present

## 2022-02-13 DIAGNOSIS — D509 Iron deficiency anemia, unspecified: Secondary | ICD-10-CM | POA: Diagnosis not present

## 2022-02-13 DIAGNOSIS — N186 End stage renal disease: Secondary | ICD-10-CM | POA: Diagnosis not present

## 2022-02-13 DIAGNOSIS — E1129 Type 2 diabetes mellitus with other diabetic kidney complication: Secondary | ICD-10-CM | POA: Diagnosis not present

## 2022-02-13 DIAGNOSIS — E440 Moderate protein-calorie malnutrition: Secondary | ICD-10-CM | POA: Diagnosis not present

## 2022-02-14 DIAGNOSIS — N2589 Other disorders resulting from impaired renal tubular function: Secondary | ICD-10-CM | POA: Diagnosis not present

## 2022-02-14 DIAGNOSIS — D509 Iron deficiency anemia, unspecified: Secondary | ICD-10-CM | POA: Diagnosis not present

## 2022-02-14 DIAGNOSIS — R82998 Other abnormal findings in urine: Secondary | ICD-10-CM | POA: Diagnosis not present

## 2022-02-14 DIAGNOSIS — D631 Anemia in chronic kidney disease: Secondary | ICD-10-CM | POA: Diagnosis not present

## 2022-02-14 DIAGNOSIS — E1129 Type 2 diabetes mellitus with other diabetic kidney complication: Secondary | ICD-10-CM | POA: Diagnosis not present

## 2022-02-14 DIAGNOSIS — E440 Moderate protein-calorie malnutrition: Secondary | ICD-10-CM | POA: Diagnosis not present

## 2022-02-14 DIAGNOSIS — Z992 Dependence on renal dialysis: Secondary | ICD-10-CM | POA: Diagnosis not present

## 2022-02-14 DIAGNOSIS — N2581 Secondary hyperparathyroidism of renal origin: Secondary | ICD-10-CM | POA: Diagnosis not present

## 2022-02-14 DIAGNOSIS — N186 End stage renal disease: Secondary | ICD-10-CM | POA: Diagnosis not present

## 2022-02-15 DIAGNOSIS — D509 Iron deficiency anemia, unspecified: Secondary | ICD-10-CM | POA: Diagnosis not present

## 2022-02-15 DIAGNOSIS — D631 Anemia in chronic kidney disease: Secondary | ICD-10-CM | POA: Diagnosis not present

## 2022-02-15 DIAGNOSIS — N2581 Secondary hyperparathyroidism of renal origin: Secondary | ICD-10-CM | POA: Diagnosis not present

## 2022-02-15 DIAGNOSIS — N186 End stage renal disease: Secondary | ICD-10-CM | POA: Diagnosis not present

## 2022-02-15 DIAGNOSIS — N2589 Other disorders resulting from impaired renal tubular function: Secondary | ICD-10-CM | POA: Diagnosis not present

## 2022-02-15 DIAGNOSIS — R82998 Other abnormal findings in urine: Secondary | ICD-10-CM | POA: Diagnosis not present

## 2022-02-15 DIAGNOSIS — E440 Moderate protein-calorie malnutrition: Secondary | ICD-10-CM | POA: Diagnosis not present

## 2022-02-15 DIAGNOSIS — Z992 Dependence on renal dialysis: Secondary | ICD-10-CM | POA: Diagnosis not present

## 2022-02-15 DIAGNOSIS — E1129 Type 2 diabetes mellitus with other diabetic kidney complication: Secondary | ICD-10-CM | POA: Diagnosis not present

## 2022-02-16 DIAGNOSIS — E440 Moderate protein-calorie malnutrition: Secondary | ICD-10-CM | POA: Diagnosis not present

## 2022-02-16 DIAGNOSIS — R82998 Other abnormal findings in urine: Secondary | ICD-10-CM | POA: Diagnosis not present

## 2022-02-16 DIAGNOSIS — D631 Anemia in chronic kidney disease: Secondary | ICD-10-CM | POA: Diagnosis not present

## 2022-02-16 DIAGNOSIS — E1129 Type 2 diabetes mellitus with other diabetic kidney complication: Secondary | ICD-10-CM | POA: Diagnosis not present

## 2022-02-16 DIAGNOSIS — N2581 Secondary hyperparathyroidism of renal origin: Secondary | ICD-10-CM | POA: Diagnosis not present

## 2022-02-16 DIAGNOSIS — N2589 Other disorders resulting from impaired renal tubular function: Secondary | ICD-10-CM | POA: Diagnosis not present

## 2022-02-16 DIAGNOSIS — Z992 Dependence on renal dialysis: Secondary | ICD-10-CM | POA: Diagnosis not present

## 2022-02-16 DIAGNOSIS — N186 End stage renal disease: Secondary | ICD-10-CM | POA: Diagnosis not present

## 2022-02-16 DIAGNOSIS — D509 Iron deficiency anemia, unspecified: Secondary | ICD-10-CM | POA: Diagnosis not present

## 2022-02-17 DIAGNOSIS — Z992 Dependence on renal dialysis: Secondary | ICD-10-CM | POA: Diagnosis not present

## 2022-02-17 DIAGNOSIS — N186 End stage renal disease: Secondary | ICD-10-CM | POA: Diagnosis not present

## 2022-02-17 DIAGNOSIS — D631 Anemia in chronic kidney disease: Secondary | ICD-10-CM | POA: Diagnosis not present

## 2022-02-17 DIAGNOSIS — N2581 Secondary hyperparathyroidism of renal origin: Secondary | ICD-10-CM | POA: Diagnosis not present

## 2022-02-17 DIAGNOSIS — E1129 Type 2 diabetes mellitus with other diabetic kidney complication: Secondary | ICD-10-CM | POA: Diagnosis not present

## 2022-02-17 DIAGNOSIS — R82998 Other abnormal findings in urine: Secondary | ICD-10-CM | POA: Diagnosis not present

## 2022-02-17 DIAGNOSIS — N2589 Other disorders resulting from impaired renal tubular function: Secondary | ICD-10-CM | POA: Diagnosis not present

## 2022-02-17 DIAGNOSIS — E440 Moderate protein-calorie malnutrition: Secondary | ICD-10-CM | POA: Diagnosis not present

## 2022-02-17 DIAGNOSIS — D509 Iron deficiency anemia, unspecified: Secondary | ICD-10-CM | POA: Diagnosis not present

## 2022-02-18 DIAGNOSIS — N186 End stage renal disease: Secondary | ICD-10-CM | POA: Diagnosis not present

## 2022-02-18 DIAGNOSIS — Z9989 Dependence on other enabling machines and devices: Secondary | ICD-10-CM | POA: Diagnosis not present

## 2022-02-18 DIAGNOSIS — D631 Anemia in chronic kidney disease: Secondary | ICD-10-CM | POA: Diagnosis not present

## 2022-02-18 DIAGNOSIS — E1129 Type 2 diabetes mellitus with other diabetic kidney complication: Secondary | ICD-10-CM | POA: Diagnosis not present

## 2022-02-18 DIAGNOSIS — R82998 Other abnormal findings in urine: Secondary | ICD-10-CM | POA: Diagnosis not present

## 2022-02-18 DIAGNOSIS — E785 Hyperlipidemia, unspecified: Secondary | ICD-10-CM | POA: Diagnosis not present

## 2022-02-18 DIAGNOSIS — Z9181 History of falling: Secondary | ICD-10-CM | POA: Diagnosis not present

## 2022-02-18 DIAGNOSIS — F32A Depression, unspecified: Secondary | ICD-10-CM | POA: Diagnosis not present

## 2022-02-18 DIAGNOSIS — Z794 Long term (current) use of insulin: Secondary | ICD-10-CM | POA: Diagnosis not present

## 2022-02-18 DIAGNOSIS — R296 Repeated falls: Secondary | ICD-10-CM | POA: Diagnosis not present

## 2022-02-18 DIAGNOSIS — I120 Hypertensive chronic kidney disease with stage 5 chronic kidney disease or end stage renal disease: Secondary | ICD-10-CM | POA: Diagnosis not present

## 2022-02-18 DIAGNOSIS — E1165 Type 2 diabetes mellitus with hyperglycemia: Secondary | ICD-10-CM | POA: Diagnosis not present

## 2022-02-18 DIAGNOSIS — D509 Iron deficiency anemia, unspecified: Secondary | ICD-10-CM | POA: Diagnosis not present

## 2022-02-18 DIAGNOSIS — Z7982 Long term (current) use of aspirin: Secondary | ICD-10-CM | POA: Diagnosis not present

## 2022-02-18 DIAGNOSIS — G4733 Obstructive sleep apnea (adult) (pediatric): Secondary | ICD-10-CM | POA: Diagnosis not present

## 2022-02-18 DIAGNOSIS — J9601 Acute respiratory failure with hypoxia: Secondary | ICD-10-CM | POA: Diagnosis not present

## 2022-02-18 DIAGNOSIS — N2581 Secondary hyperparathyroidism of renal origin: Secondary | ICD-10-CM | POA: Diagnosis not present

## 2022-02-18 DIAGNOSIS — N2589 Other disorders resulting from impaired renal tubular function: Secondary | ICD-10-CM | POA: Diagnosis not present

## 2022-02-18 DIAGNOSIS — Z992 Dependence on renal dialysis: Secondary | ICD-10-CM | POA: Diagnosis not present

## 2022-02-18 DIAGNOSIS — E440 Moderate protein-calorie malnutrition: Secondary | ICD-10-CM | POA: Diagnosis not present

## 2022-02-18 DIAGNOSIS — E1122 Type 2 diabetes mellitus with diabetic chronic kidney disease: Secondary | ICD-10-CM | POA: Diagnosis not present

## 2022-02-18 DIAGNOSIS — A403 Sepsis due to Streptococcus pneumoniae: Secondary | ICD-10-CM | POA: Diagnosis not present

## 2022-02-19 DIAGNOSIS — N2581 Secondary hyperparathyroidism of renal origin: Secondary | ICD-10-CM | POA: Diagnosis not present

## 2022-02-19 DIAGNOSIS — Z992 Dependence on renal dialysis: Secondary | ICD-10-CM | POA: Diagnosis not present

## 2022-02-19 DIAGNOSIS — E1129 Type 2 diabetes mellitus with other diabetic kidney complication: Secondary | ICD-10-CM | POA: Diagnosis not present

## 2022-02-19 DIAGNOSIS — N186 End stage renal disease: Secondary | ICD-10-CM | POA: Diagnosis not present

## 2022-02-19 DIAGNOSIS — N2589 Other disorders resulting from impaired renal tubular function: Secondary | ICD-10-CM | POA: Diagnosis not present

## 2022-02-19 DIAGNOSIS — R82998 Other abnormal findings in urine: Secondary | ICD-10-CM | POA: Diagnosis not present

## 2022-02-19 DIAGNOSIS — E440 Moderate protein-calorie malnutrition: Secondary | ICD-10-CM | POA: Diagnosis not present

## 2022-02-19 DIAGNOSIS — D631 Anemia in chronic kidney disease: Secondary | ICD-10-CM | POA: Diagnosis not present

## 2022-02-19 DIAGNOSIS — D509 Iron deficiency anemia, unspecified: Secondary | ICD-10-CM | POA: Diagnosis not present

## 2022-02-20 ENCOUNTER — Encounter: Payer: Self-pay | Admitting: Adult Health

## 2022-02-20 ENCOUNTER — Ambulatory Visit: Payer: Medicare Other | Admitting: Adult Health

## 2022-02-20 ENCOUNTER — Other Ambulatory Visit: Payer: Self-pay

## 2022-02-20 VITALS — BP 187/78 | HR 76 | Ht 67.0 in | Wt 188.0 lb

## 2022-02-20 DIAGNOSIS — E1129 Type 2 diabetes mellitus with other diabetic kidney complication: Secondary | ICD-10-CM | POA: Diagnosis not present

## 2022-02-20 DIAGNOSIS — E440 Moderate protein-calorie malnutrition: Secondary | ICD-10-CM | POA: Diagnosis not present

## 2022-02-20 DIAGNOSIS — G4733 Obstructive sleep apnea (adult) (pediatric): Secondary | ICD-10-CM

## 2022-02-20 DIAGNOSIS — N2581 Secondary hyperparathyroidism of renal origin: Secondary | ICD-10-CM | POA: Diagnosis not present

## 2022-02-20 DIAGNOSIS — N186 End stage renal disease: Secondary | ICD-10-CM | POA: Diagnosis not present

## 2022-02-20 DIAGNOSIS — Z992 Dependence on renal dialysis: Secondary | ICD-10-CM | POA: Diagnosis not present

## 2022-02-20 DIAGNOSIS — R82998 Other abnormal findings in urine: Secondary | ICD-10-CM | POA: Diagnosis not present

## 2022-02-20 DIAGNOSIS — Z9989 Dependence on other enabling machines and devices: Secondary | ICD-10-CM | POA: Diagnosis not present

## 2022-02-20 DIAGNOSIS — N2589 Other disorders resulting from impaired renal tubular function: Secondary | ICD-10-CM | POA: Diagnosis not present

## 2022-02-20 DIAGNOSIS — D509 Iron deficiency anemia, unspecified: Secondary | ICD-10-CM | POA: Diagnosis not present

## 2022-02-20 DIAGNOSIS — D631 Anemia in chronic kidney disease: Secondary | ICD-10-CM | POA: Diagnosis not present

## 2022-02-20 NOTE — Progress Notes (Signed)
?[facility] ?[address] ?[place]. [place] [address] ?[phone] ? ?     OFFICE FOLLOW UP NOTE ? ?Ms. Felicia Thompson ?Date of Birth:  24-Mar-1946 ?Medical Record Number:  [id number]  ? ?Reason for visit: Initial CPAP follow-up ? ? ? ?SUBJECTIVE: ? ? ?CHIEF COMPLAINT:  ?Chief Complaint  ?Patient presents with  ? Obstructive Sleep Apnea  ?  Rm 2 alone ?Pt is well and stable, no concerns with CPAP   ? ? ?HPI:  ? ?Update 02/20/2022 JM: Patient returns for initial CPAP follow-up visit.  Received new CPAP machine on 12/05/2021.  Review of compliance report from 01/22/2022 -02/20/2022 shows 17 out of 30 usage days with 13 days greater than 4 hours per 43% usage.  Average usage 5 hours and 5 minutes.  Residual AHI 5.0.  Leaks in the 90 percentile 5.5.  Pressure in the 95th percentile 8.9. ? ?Reports tolerating CPAP well. At times will fall asleep before she is able to put mask on or if she stays up too late, she will not put mask on.  Current use of nasal mask.  Tolerates pressure without difficulty.  Epworth Sleepiness Scale 2/24.  Fatigue severity scale 44/63 but does report iron deficiency anemia and currently receiving treatment.  No further concerns at this time. ? ? ? ? ?History provided for reference purposes only ?Initial consult visit 09/24/2021 Dr. Brett Fairy: ?Felicia Thompson is a 76 y.o. Caucasian female patient of Dr Jabier Mutton seen here as a referral on 09/24/2021  for a transfer of CPAP/ OSA care.Marland Kitchen  ?Chief concern according to patient : I was never treated for apnea. Rm 10, alone. Internal referral for OSA. Pt dx with OSA in [place] just 5 months ago 5/022.  ?Mrs. [name] reports she had undergone an attended sleep study in [place], she was woken up after her AHI exceeded 20 and first placed on a FFM which she did dislike. Then started with a nasal mask and chinstrap, but no follow up it was performed the last day of her stay in [place]. She is widowed and moved to [place] to be close to  two adult children.   ? ?The patient had the first sleep study in [place] earlier this year , in May. Since February , she had falls and spells, and her children initiated her move here.  ?  ?Sleep relevant medical history: no Nocturia, peritoneal dialysis, [facility]. Had tonsillectomy. hysterectomy, cholecystectomy.  Renal disease due to NSAIDS overuse over 3 decades.  ? Family medical /sleep history: no other family member on CPAP with OSA, insomnia, sleep walkers.  ?  ?Social history:  Patient is retired from  Systems analyst- and lives in a household with 4 persons. Family status is widowed , with adult children, lives with daughters family now.  2 grandchildren.  ?Tobacco use never .  ETOH use never ,  ?Caffeine intake in form of Coffee( 1 cup AM) Soda( sometimes) Tea ( dinner ) or energy drinks. ?  ?  ?Sleep habits are as follows: The patient's dinner time is between 5-7 PM. The patient goes to bed at 10 PM and continues to use the peritoneal dialysis with a " cycler" 4 times during sleep . ?The preferred sleep position is laterally/  ?, with the support of 2 pillows. Dreams are reportedly frequent/vivid.  ?11  AM is the usual rise time. The patient wakes up 8.30 with an alarm. She can't get up. ?He/ She reports not feeling refreshed or restored in AM,  with symptoms such as dry mouth, morning residual fatigue. Naps are taken infrequently, lasting from 3-4 hours  and are more less refreshing than nocturnal sleep.  ? ?ROS:   ?14 system review of systems performed and negative with exception of those listed in HPI ? ?PMH:  ?Past Medical History:  ?Diagnosis Date  ? Arthritis   ? Blood in stool   ? when appendix burst  ? Chronic kidney disease   ? Depression   ? DM (diabetes mellitus), type 2 with complications ([place])   ? ESRD on peritoneal dialysis [facility])   ? History of fainting spells of unknown cause   ? Hyperlipidemia   ? Hyperlipidemia associated with type 2 diabetes mellitus ([place])   ? Hypertension   ?  OSA (obstructive sleep apnea)   ? ? ?PSH:  ?Past Surgical History:  ?Procedure Laterality Date  ? ABDOMINAL HYSTERECTOMY    ? APPENDECTOMY    ? CHOLECYSTECTOMY    ? TONSILECTOMY, ADENOIDECTOMY, BILATERAL MYRINGOTOMY AND TUBES    ? ? ?Social History:  ?Social History  ? ?Socioeconomic History  ? Marital status: Widowed  ?  Spouse name: Not on file  ? Number of children: 4  ? Years of education: Not on file  ? Highest education level: Master's degree (e.g., MA, MS, MEng, MEd, MSW, MBA)  ?Occupational History  ? Not on file  ?Tobacco Use  ? Smoking status: Never  ? Smokeless tobacco: Never  ?Substance and Sexual Activity  ? Alcohol use: Never  ? Drug use: Never  ? Sexual activity: Not Currently  ?Other Topics Concern  ? Not on file  ?Social History Narrative  ? Lives with daughter and son in law and 2 children  ? Right handed   ? Caffeine: 3 cup a week  ? ?Social Determinants of Health  ? ?Financial Resource Strain: Low Risk   ? Difficulty of Paying Living Expenses: Not hard at all  ?Food Insecurity: No Food Insecurity  ? Worried About Charity fundraiser in the Last Year: Never true  ? Ran Out of Food in the Last Year: Never true  ?Transportation Needs: No Transportation Needs  ? Lack of Transportation (Medical): No  ? Lack of Transportation (Non-Medical): No  ?Physical Activity: Unknown  ? Days of Exercise per Week: 0 days  ? Minutes of Exercise per Session: Not on file  ?Stress: No Stress Concern Present  ? Feeling of Stress : Not at all  ?Social Connections: Moderately Isolated  ? Frequency of Communication with Friends and Family: More than three times a week  ? Frequency of Social Gatherings with Friends and Family: Never  ? Attends Religious Services: 1 to 4 times per year  ? Active Member of Clubs or Organizations: No  ? Attends Archivist Meetings: Not on file  ? Marital Status: Widowed  ?Intimate Partner Violence: Not on file  ? ? ?Family History:  ?Family History  ?Problem Relation Age of Onset  ?  Hearing loss Father   ? Hyperlipidemia Father   ? Heart disease Father   ? Early death Father   ? Cancer Father   ? Early death Brother   ? Drug abuse Brother   ? Diabetes Brother   ? Depression Brother   ? Alcohol abuse Brother   ? Miscarriages / [place] Daughter   ? Arthritis Daughter   ? Depression Daughter   ? Early death Maternal Grandmother   ? Heart attack Maternal Grandmother   ? Heart disease  Maternal Grandmother   ? Hearing loss Paternal Grandmother   ? Heart disease Paternal Grandmother   ? Heart disease Paternal Grandfather   ? Early death Paternal Grandfather   ? Heart attack Paternal Grandfather   ? ? ?Medications:   ?Current Outpatient Medications on File Prior to Visit  ?Medication Sig Dispense Refill  ? aspirin 81 MG chewable tablet Chew 81 mg by mouth daily.    ? benzonatate (TESSALON PERLES) 100 MG capsule 1-2 capsules as needed cough 30 capsule 0  ? carvedilol (COREG) 25 MG tablet Take 25 mg by mouth 2 (two) times daily with a meal.    ? Continuous Blood Gluc Sensor (FREESTYLE LIBRE 2 SENSOR) MISC 1 Device by Does not apply route every 14 (fourteen) days. 6 each 3  ? glipiZIDE (GLUCOTROL) 5 MG tablet Take 0.5 tablets (2.5 mg total) by mouth daily before breakfast. 30 tablet 3  ? insulin NPH-regular Human (HUMULIN 70/30) (70-30) 100 UNIT/ML injection Inject 10 Units into the skin daily with breakfast. (Patient taking differently: Inject 10 Units into the skin at bedtime.) 30 mL 11  ? losartan (COZAAR) 50 MG tablet Take 1 tablet (50 mg total) by mouth daily. 30 tablet 3  ? Multiple Vitamins-Minerals (RENAPLEX) TABS Take 1 tablet by mouth daily.    ? Probiotic Product (CVS ADV PROBIOTIC GUMMIES PO) Take 1 tablet by mouth at bedtime.    ? sevelamer carbonate (RENVELA) 800 MG tablet Take 2,400 mg by mouth 3 (three) times daily with meals.    ? venlafaxine XR (EFFEXOR-XR) 150 MG 24 hr capsule Take 150 mg by mouth daily with breakfast.    ? ?No current facility-administered medications on file prior  to visit.  ? ? ?Allergies:   ?Allergies  ?Allergen Reactions  ? Tape Other (See Comments)  ?  Blisters  ? Tetracyclines & Related Rash  ? ? ? ? ?OBJECTIVE: ? ?Physical Exam ? ?Vitals:  ? 02/20/22 1502

## 2022-02-21 DIAGNOSIS — E1129 Type 2 diabetes mellitus with other diabetic kidney complication: Secondary | ICD-10-CM | POA: Diagnosis not present

## 2022-02-21 DIAGNOSIS — R82998 Other abnormal findings in urine: Secondary | ICD-10-CM | POA: Diagnosis not present

## 2022-02-21 DIAGNOSIS — N2581 Secondary hyperparathyroidism of renal origin: Secondary | ICD-10-CM | POA: Diagnosis not present

## 2022-02-21 DIAGNOSIS — D631 Anemia in chronic kidney disease: Secondary | ICD-10-CM | POA: Diagnosis not present

## 2022-02-21 DIAGNOSIS — N2589 Other disorders resulting from impaired renal tubular function: Secondary | ICD-10-CM | POA: Diagnosis not present

## 2022-02-21 DIAGNOSIS — E440 Moderate protein-calorie malnutrition: Secondary | ICD-10-CM | POA: Diagnosis not present

## 2022-02-21 DIAGNOSIS — Z992 Dependence on renal dialysis: Secondary | ICD-10-CM | POA: Diagnosis not present

## 2022-02-21 DIAGNOSIS — N186 End stage renal disease: Secondary | ICD-10-CM | POA: Diagnosis not present

## 2022-02-21 DIAGNOSIS — D509 Iron deficiency anemia, unspecified: Secondary | ICD-10-CM | POA: Diagnosis not present

## 2022-02-22 DIAGNOSIS — Z992 Dependence on renal dialysis: Secondary | ICD-10-CM | POA: Diagnosis not present

## 2022-02-22 DIAGNOSIS — R82998 Other abnormal findings in urine: Secondary | ICD-10-CM | POA: Diagnosis not present

## 2022-02-22 DIAGNOSIS — D509 Iron deficiency anemia, unspecified: Secondary | ICD-10-CM | POA: Diagnosis not present

## 2022-02-22 DIAGNOSIS — D631 Anemia in chronic kidney disease: Secondary | ICD-10-CM | POA: Diagnosis not present

## 2022-02-22 DIAGNOSIS — E1129 Type 2 diabetes mellitus with other diabetic kidney complication: Secondary | ICD-10-CM | POA: Diagnosis not present

## 2022-02-22 DIAGNOSIS — E440 Moderate protein-calorie malnutrition: Secondary | ICD-10-CM | POA: Diagnosis not present

## 2022-02-22 DIAGNOSIS — N2581 Secondary hyperparathyroidism of renal origin: Secondary | ICD-10-CM | POA: Diagnosis not present

## 2022-02-22 DIAGNOSIS — N2589 Other disorders resulting from impaired renal tubular function: Secondary | ICD-10-CM | POA: Diagnosis not present

## 2022-02-22 DIAGNOSIS — N186 End stage renal disease: Secondary | ICD-10-CM | POA: Diagnosis not present

## 2022-02-23 DIAGNOSIS — E1129 Type 2 diabetes mellitus with other diabetic kidney complication: Secondary | ICD-10-CM | POA: Diagnosis not present

## 2022-02-23 DIAGNOSIS — D509 Iron deficiency anemia, unspecified: Secondary | ICD-10-CM | POA: Diagnosis not present

## 2022-02-23 DIAGNOSIS — N2589 Other disorders resulting from impaired renal tubular function: Secondary | ICD-10-CM | POA: Diagnosis not present

## 2022-02-23 DIAGNOSIS — Z992 Dependence on renal dialysis: Secondary | ICD-10-CM | POA: Diagnosis not present

## 2022-02-23 DIAGNOSIS — E440 Moderate protein-calorie malnutrition: Secondary | ICD-10-CM | POA: Diagnosis not present

## 2022-02-23 DIAGNOSIS — D631 Anemia in chronic kidney disease: Secondary | ICD-10-CM | POA: Diagnosis not present

## 2022-02-23 DIAGNOSIS — N2581 Secondary hyperparathyroidism of renal origin: Secondary | ICD-10-CM | POA: Diagnosis not present

## 2022-02-23 DIAGNOSIS — N186 End stage renal disease: Secondary | ICD-10-CM | POA: Diagnosis not present

## 2022-02-23 DIAGNOSIS — R82998 Other abnormal findings in urine: Secondary | ICD-10-CM | POA: Diagnosis not present

## 2022-02-24 DIAGNOSIS — D509 Iron deficiency anemia, unspecified: Secondary | ICD-10-CM | POA: Diagnosis not present

## 2022-02-24 DIAGNOSIS — N2581 Secondary hyperparathyroidism of renal origin: Secondary | ICD-10-CM | POA: Diagnosis not present

## 2022-02-24 DIAGNOSIS — N2589 Other disorders resulting from impaired renal tubular function: Secondary | ICD-10-CM | POA: Diagnosis not present

## 2022-02-24 DIAGNOSIS — E440 Moderate protein-calorie malnutrition: Secondary | ICD-10-CM | POA: Diagnosis not present

## 2022-02-24 DIAGNOSIS — N186 End stage renal disease: Secondary | ICD-10-CM | POA: Diagnosis not present

## 2022-02-24 DIAGNOSIS — D631 Anemia in chronic kidney disease: Secondary | ICD-10-CM | POA: Diagnosis not present

## 2022-02-24 DIAGNOSIS — E1129 Type 2 diabetes mellitus with other diabetic kidney complication: Secondary | ICD-10-CM | POA: Diagnosis not present

## 2022-02-24 DIAGNOSIS — Z992 Dependence on renal dialysis: Secondary | ICD-10-CM | POA: Diagnosis not present

## 2022-02-24 DIAGNOSIS — R82998 Other abnormal findings in urine: Secondary | ICD-10-CM | POA: Diagnosis not present

## 2022-02-25 DIAGNOSIS — N186 End stage renal disease: Secondary | ICD-10-CM | POA: Diagnosis not present

## 2022-02-25 DIAGNOSIS — R82998 Other abnormal findings in urine: Secondary | ICD-10-CM | POA: Diagnosis not present

## 2022-02-25 DIAGNOSIS — D509 Iron deficiency anemia, unspecified: Secondary | ICD-10-CM | POA: Diagnosis not present

## 2022-02-25 DIAGNOSIS — N2581 Secondary hyperparathyroidism of renal origin: Secondary | ICD-10-CM | POA: Diagnosis not present

## 2022-02-25 DIAGNOSIS — E1129 Type 2 diabetes mellitus with other diabetic kidney complication: Secondary | ICD-10-CM | POA: Diagnosis not present

## 2022-02-25 DIAGNOSIS — Z992 Dependence on renal dialysis: Secondary | ICD-10-CM | POA: Diagnosis not present

## 2022-02-25 DIAGNOSIS — N2589 Other disorders resulting from impaired renal tubular function: Secondary | ICD-10-CM | POA: Diagnosis not present

## 2022-02-25 DIAGNOSIS — D631 Anemia in chronic kidney disease: Secondary | ICD-10-CM | POA: Diagnosis not present

## 2022-02-25 DIAGNOSIS — E440 Moderate protein-calorie malnutrition: Secondary | ICD-10-CM | POA: Diagnosis not present

## 2022-02-26 DIAGNOSIS — R82998 Other abnormal findings in urine: Secondary | ICD-10-CM | POA: Diagnosis not present

## 2022-02-26 DIAGNOSIS — Z992 Dependence on renal dialysis: Secondary | ICD-10-CM | POA: Diagnosis not present

## 2022-02-26 DIAGNOSIS — E1129 Type 2 diabetes mellitus with other diabetic kidney complication: Secondary | ICD-10-CM | POA: Diagnosis not present

## 2022-02-26 DIAGNOSIS — N2589 Other disorders resulting from impaired renal tubular function: Secondary | ICD-10-CM | POA: Diagnosis not present

## 2022-02-26 DIAGNOSIS — D509 Iron deficiency anemia, unspecified: Secondary | ICD-10-CM | POA: Diagnosis not present

## 2022-02-26 DIAGNOSIS — N186 End stage renal disease: Secondary | ICD-10-CM | POA: Diagnosis not present

## 2022-02-26 DIAGNOSIS — D631 Anemia in chronic kidney disease: Secondary | ICD-10-CM | POA: Diagnosis not present

## 2022-02-26 DIAGNOSIS — E440 Moderate protein-calorie malnutrition: Secondary | ICD-10-CM | POA: Diagnosis not present

## 2022-02-26 DIAGNOSIS — N2581 Secondary hyperparathyroidism of renal origin: Secondary | ICD-10-CM | POA: Diagnosis not present

## 2022-02-27 DIAGNOSIS — N2581 Secondary hyperparathyroidism of renal origin: Secondary | ICD-10-CM | POA: Diagnosis not present

## 2022-02-27 DIAGNOSIS — N2589 Other disorders resulting from impaired renal tubular function: Secondary | ICD-10-CM | POA: Diagnosis not present

## 2022-02-27 DIAGNOSIS — N186 End stage renal disease: Secondary | ICD-10-CM | POA: Diagnosis not present

## 2022-02-27 DIAGNOSIS — D509 Iron deficiency anemia, unspecified: Secondary | ICD-10-CM | POA: Diagnosis not present

## 2022-02-27 DIAGNOSIS — Z992 Dependence on renal dialysis: Secondary | ICD-10-CM | POA: Diagnosis not present

## 2022-02-27 DIAGNOSIS — E440 Moderate protein-calorie malnutrition: Secondary | ICD-10-CM | POA: Diagnosis not present

## 2022-02-27 DIAGNOSIS — R82998 Other abnormal findings in urine: Secondary | ICD-10-CM | POA: Diagnosis not present

## 2022-02-27 DIAGNOSIS — D631 Anemia in chronic kidney disease: Secondary | ICD-10-CM | POA: Diagnosis not present

## 2022-02-27 DIAGNOSIS — E1129 Type 2 diabetes mellitus with other diabetic kidney complication: Secondary | ICD-10-CM | POA: Diagnosis not present

## 2022-02-28 DIAGNOSIS — N2589 Other disorders resulting from impaired renal tubular function: Secondary | ICD-10-CM | POA: Diagnosis not present

## 2022-02-28 DIAGNOSIS — N186 End stage renal disease: Secondary | ICD-10-CM | POA: Diagnosis not present

## 2022-02-28 DIAGNOSIS — N2581 Secondary hyperparathyroidism of renal origin: Secondary | ICD-10-CM | POA: Diagnosis not present

## 2022-02-28 DIAGNOSIS — D631 Anemia in chronic kidney disease: Secondary | ICD-10-CM | POA: Diagnosis not present

## 2022-02-28 DIAGNOSIS — E440 Moderate protein-calorie malnutrition: Secondary | ICD-10-CM | POA: Diagnosis not present

## 2022-02-28 DIAGNOSIS — Z992 Dependence on renal dialysis: Secondary | ICD-10-CM | POA: Diagnosis not present

## 2022-02-28 DIAGNOSIS — R82998 Other abnormal findings in urine: Secondary | ICD-10-CM | POA: Diagnosis not present

## 2022-02-28 DIAGNOSIS — D509 Iron deficiency anemia, unspecified: Secondary | ICD-10-CM | POA: Diagnosis not present

## 2022-02-28 DIAGNOSIS — E1129 Type 2 diabetes mellitus with other diabetic kidney complication: Secondary | ICD-10-CM | POA: Diagnosis not present

## 2022-03-01 DIAGNOSIS — N186 End stage renal disease: Secondary | ICD-10-CM | POA: Diagnosis not present

## 2022-03-01 DIAGNOSIS — N2581 Secondary hyperparathyroidism of renal origin: Secondary | ICD-10-CM | POA: Diagnosis not present

## 2022-03-01 DIAGNOSIS — D509 Iron deficiency anemia, unspecified: Secondary | ICD-10-CM | POA: Diagnosis not present

## 2022-03-01 DIAGNOSIS — N2589 Other disorders resulting from impaired renal tubular function: Secondary | ICD-10-CM | POA: Diagnosis not present

## 2022-03-01 DIAGNOSIS — E440 Moderate protein-calorie malnutrition: Secondary | ICD-10-CM | POA: Diagnosis not present

## 2022-03-01 DIAGNOSIS — E1129 Type 2 diabetes mellitus with other diabetic kidney complication: Secondary | ICD-10-CM | POA: Diagnosis not present

## 2022-03-01 DIAGNOSIS — R82998 Other abnormal findings in urine: Secondary | ICD-10-CM | POA: Diagnosis not present

## 2022-03-01 DIAGNOSIS — D631 Anemia in chronic kidney disease: Secondary | ICD-10-CM | POA: Diagnosis not present

## 2022-03-01 DIAGNOSIS — Z992 Dependence on renal dialysis: Secondary | ICD-10-CM | POA: Diagnosis not present

## 2022-03-02 DIAGNOSIS — Z79899 Other long term (current) drug therapy: Secondary | ICD-10-CM | POA: Diagnosis not present

## 2022-03-02 DIAGNOSIS — N186 End stage renal disease: Secondary | ICD-10-CM | POA: Diagnosis not present

## 2022-03-02 DIAGNOSIS — E875 Hyperkalemia: Secondary | ICD-10-CM | POA: Diagnosis not present

## 2022-03-02 DIAGNOSIS — R82998 Other abnormal findings in urine: Secondary | ICD-10-CM | POA: Diagnosis not present

## 2022-03-02 DIAGNOSIS — N2581 Secondary hyperparathyroidism of renal origin: Secondary | ICD-10-CM | POA: Diagnosis not present

## 2022-03-02 DIAGNOSIS — Z992 Dependence on renal dialysis: Secondary | ICD-10-CM | POA: Diagnosis not present

## 2022-03-02 DIAGNOSIS — D631 Anemia in chronic kidney disease: Secondary | ICD-10-CM | POA: Diagnosis not present

## 2022-03-02 DIAGNOSIS — D509 Iron deficiency anemia, unspecified: Secondary | ICD-10-CM | POA: Diagnosis not present

## 2022-03-03 DIAGNOSIS — N2581 Secondary hyperparathyroidism of renal origin: Secondary | ICD-10-CM | POA: Diagnosis not present

## 2022-03-03 DIAGNOSIS — Z79899 Other long term (current) drug therapy: Secondary | ICD-10-CM | POA: Diagnosis not present

## 2022-03-03 DIAGNOSIS — Z992 Dependence on renal dialysis: Secondary | ICD-10-CM | POA: Diagnosis not present

## 2022-03-03 DIAGNOSIS — D631 Anemia in chronic kidney disease: Secondary | ICD-10-CM | POA: Diagnosis not present

## 2022-03-03 DIAGNOSIS — D509 Iron deficiency anemia, unspecified: Secondary | ICD-10-CM | POA: Diagnosis not present

## 2022-03-03 DIAGNOSIS — E875 Hyperkalemia: Secondary | ICD-10-CM | POA: Diagnosis not present

## 2022-03-03 DIAGNOSIS — R82998 Other abnormal findings in urine: Secondary | ICD-10-CM | POA: Diagnosis not present

## 2022-03-03 DIAGNOSIS — N186 End stage renal disease: Secondary | ICD-10-CM | POA: Diagnosis not present

## 2022-03-04 DIAGNOSIS — N186 End stage renal disease: Secondary | ICD-10-CM | POA: Diagnosis not present

## 2022-03-04 DIAGNOSIS — D509 Iron deficiency anemia, unspecified: Secondary | ICD-10-CM | POA: Diagnosis not present

## 2022-03-04 DIAGNOSIS — E875 Hyperkalemia: Secondary | ICD-10-CM | POA: Diagnosis not present

## 2022-03-04 DIAGNOSIS — Z992 Dependence on renal dialysis: Secondary | ICD-10-CM | POA: Diagnosis not present

## 2022-03-04 DIAGNOSIS — N2581 Secondary hyperparathyroidism of renal origin: Secondary | ICD-10-CM | POA: Diagnosis not present

## 2022-03-04 DIAGNOSIS — D631 Anemia in chronic kidney disease: Secondary | ICD-10-CM | POA: Diagnosis not present

## 2022-03-04 DIAGNOSIS — R82998 Other abnormal findings in urine: Secondary | ICD-10-CM | POA: Diagnosis not present

## 2022-03-04 DIAGNOSIS — Z79899 Other long term (current) drug therapy: Secondary | ICD-10-CM | POA: Diagnosis not present

## 2022-03-05 ENCOUNTER — Other Ambulatory Visit (INDEPENDENT_AMBULATORY_CARE_PROVIDER_SITE_OTHER): Payer: Medicare Other

## 2022-03-05 DIAGNOSIS — N2581 Secondary hyperparathyroidism of renal origin: Secondary | ICD-10-CM | POA: Diagnosis not present

## 2022-03-05 DIAGNOSIS — E118 Type 2 diabetes mellitus with unspecified complications: Secondary | ICD-10-CM | POA: Diagnosis not present

## 2022-03-05 DIAGNOSIS — Z79899 Other long term (current) drug therapy: Secondary | ICD-10-CM | POA: Diagnosis not present

## 2022-03-05 DIAGNOSIS — Z992 Dependence on renal dialysis: Secondary | ICD-10-CM | POA: Diagnosis not present

## 2022-03-05 DIAGNOSIS — N186 End stage renal disease: Secondary | ICD-10-CM | POA: Diagnosis not present

## 2022-03-05 DIAGNOSIS — D631 Anemia in chronic kidney disease: Secondary | ICD-10-CM | POA: Diagnosis not present

## 2022-03-05 DIAGNOSIS — E875 Hyperkalemia: Secondary | ICD-10-CM | POA: Diagnosis not present

## 2022-03-05 DIAGNOSIS — D509 Iron deficiency anemia, unspecified: Secondary | ICD-10-CM | POA: Diagnosis not present

## 2022-03-05 DIAGNOSIS — R82998 Other abnormal findings in urine: Secondary | ICD-10-CM | POA: Diagnosis not present

## 2022-03-06 DIAGNOSIS — N186 End stage renal disease: Secondary | ICD-10-CM | POA: Diagnosis not present

## 2022-03-06 DIAGNOSIS — N2581 Secondary hyperparathyroidism of renal origin: Secondary | ICD-10-CM | POA: Diagnosis not present

## 2022-03-06 DIAGNOSIS — Z79899 Other long term (current) drug therapy: Secondary | ICD-10-CM | POA: Diagnosis not present

## 2022-03-06 DIAGNOSIS — D509 Iron deficiency anemia, unspecified: Secondary | ICD-10-CM | POA: Diagnosis not present

## 2022-03-06 DIAGNOSIS — E875 Hyperkalemia: Secondary | ICD-10-CM | POA: Diagnosis not present

## 2022-03-06 DIAGNOSIS — D631 Anemia in chronic kidney disease: Secondary | ICD-10-CM | POA: Diagnosis not present

## 2022-03-06 DIAGNOSIS — Z992 Dependence on renal dialysis: Secondary | ICD-10-CM | POA: Diagnosis not present

## 2022-03-06 DIAGNOSIS — R82998 Other abnormal findings in urine: Secondary | ICD-10-CM | POA: Diagnosis not present

## 2022-03-06 LAB — HEMOGLOBIN A1C: Hgb A1c MFr Bld: 6 % (ref 4.6–6.5)

## 2022-03-07 DIAGNOSIS — N2581 Secondary hyperparathyroidism of renal origin: Secondary | ICD-10-CM | POA: Diagnosis not present

## 2022-03-07 DIAGNOSIS — D631 Anemia in chronic kidney disease: Secondary | ICD-10-CM | POA: Diagnosis not present

## 2022-03-07 DIAGNOSIS — Z992 Dependence on renal dialysis: Secondary | ICD-10-CM | POA: Diagnosis not present

## 2022-03-07 DIAGNOSIS — D509 Iron deficiency anemia, unspecified: Secondary | ICD-10-CM | POA: Diagnosis not present

## 2022-03-07 DIAGNOSIS — N186 End stage renal disease: Secondary | ICD-10-CM | POA: Diagnosis not present

## 2022-03-07 DIAGNOSIS — R82998 Other abnormal findings in urine: Secondary | ICD-10-CM | POA: Diagnosis not present

## 2022-03-07 DIAGNOSIS — Z79899 Other long term (current) drug therapy: Secondary | ICD-10-CM | POA: Diagnosis not present

## 2022-03-07 DIAGNOSIS — E875 Hyperkalemia: Secondary | ICD-10-CM | POA: Diagnosis not present

## 2022-03-08 DIAGNOSIS — Z992 Dependence on renal dialysis: Secondary | ICD-10-CM | POA: Diagnosis not present

## 2022-03-08 DIAGNOSIS — D509 Iron deficiency anemia, unspecified: Secondary | ICD-10-CM | POA: Diagnosis not present

## 2022-03-08 DIAGNOSIS — N2581 Secondary hyperparathyroidism of renal origin: Secondary | ICD-10-CM | POA: Diagnosis not present

## 2022-03-08 DIAGNOSIS — N186 End stage renal disease: Secondary | ICD-10-CM | POA: Diagnosis not present

## 2022-03-08 DIAGNOSIS — Z79899 Other long term (current) drug therapy: Secondary | ICD-10-CM | POA: Diagnosis not present

## 2022-03-08 DIAGNOSIS — R82998 Other abnormal findings in urine: Secondary | ICD-10-CM | POA: Diagnosis not present

## 2022-03-08 DIAGNOSIS — D631 Anemia in chronic kidney disease: Secondary | ICD-10-CM | POA: Diagnosis not present

## 2022-03-08 DIAGNOSIS — E875 Hyperkalemia: Secondary | ICD-10-CM | POA: Diagnosis not present

## 2022-03-09 DIAGNOSIS — R82998 Other abnormal findings in urine: Secondary | ICD-10-CM | POA: Diagnosis not present

## 2022-03-09 DIAGNOSIS — E875 Hyperkalemia: Secondary | ICD-10-CM | POA: Diagnosis not present

## 2022-03-09 DIAGNOSIS — Z992 Dependence on renal dialysis: Secondary | ICD-10-CM | POA: Diagnosis not present

## 2022-03-09 DIAGNOSIS — D631 Anemia in chronic kidney disease: Secondary | ICD-10-CM | POA: Diagnosis not present

## 2022-03-09 DIAGNOSIS — Z79899 Other long term (current) drug therapy: Secondary | ICD-10-CM | POA: Diagnosis not present

## 2022-03-09 DIAGNOSIS — N2581 Secondary hyperparathyroidism of renal origin: Secondary | ICD-10-CM | POA: Diagnosis not present

## 2022-03-09 DIAGNOSIS — D509 Iron deficiency anemia, unspecified: Secondary | ICD-10-CM | POA: Diagnosis not present

## 2022-03-09 DIAGNOSIS — N186 End stage renal disease: Secondary | ICD-10-CM | POA: Diagnosis not present

## 2022-03-10 DIAGNOSIS — D631 Anemia in chronic kidney disease: Secondary | ICD-10-CM | POA: Diagnosis not present

## 2022-03-10 DIAGNOSIS — N2581 Secondary hyperparathyroidism of renal origin: Secondary | ICD-10-CM | POA: Diagnosis not present

## 2022-03-10 DIAGNOSIS — E875 Hyperkalemia: Secondary | ICD-10-CM | POA: Diagnosis not present

## 2022-03-10 DIAGNOSIS — Z992 Dependence on renal dialysis: Secondary | ICD-10-CM | POA: Diagnosis not present

## 2022-03-10 DIAGNOSIS — R82998 Other abnormal findings in urine: Secondary | ICD-10-CM | POA: Diagnosis not present

## 2022-03-10 DIAGNOSIS — D509 Iron deficiency anemia, unspecified: Secondary | ICD-10-CM | POA: Diagnosis not present

## 2022-03-10 DIAGNOSIS — Z79899 Other long term (current) drug therapy: Secondary | ICD-10-CM | POA: Diagnosis not present

## 2022-03-10 DIAGNOSIS — N186 End stage renal disease: Secondary | ICD-10-CM | POA: Diagnosis not present

## 2022-03-10 LAB — FRUCTOSAMINE: Fructosamine: 232 umol/L (ref 205–285)

## 2022-03-11 DIAGNOSIS — N2581 Secondary hyperparathyroidism of renal origin: Secondary | ICD-10-CM | POA: Diagnosis not present

## 2022-03-11 DIAGNOSIS — E875 Hyperkalemia: Secondary | ICD-10-CM | POA: Diagnosis not present

## 2022-03-11 DIAGNOSIS — R82998 Other abnormal findings in urine: Secondary | ICD-10-CM | POA: Diagnosis not present

## 2022-03-11 DIAGNOSIS — D631 Anemia in chronic kidney disease: Secondary | ICD-10-CM | POA: Diagnosis not present

## 2022-03-11 DIAGNOSIS — Z79899 Other long term (current) drug therapy: Secondary | ICD-10-CM | POA: Diagnosis not present

## 2022-03-11 DIAGNOSIS — D509 Iron deficiency anemia, unspecified: Secondary | ICD-10-CM | POA: Diagnosis not present

## 2022-03-11 DIAGNOSIS — Z992 Dependence on renal dialysis: Secondary | ICD-10-CM | POA: Diagnosis not present

## 2022-03-11 DIAGNOSIS — N186 End stage renal disease: Secondary | ICD-10-CM | POA: Diagnosis not present

## 2022-03-12 DIAGNOSIS — Z79899 Other long term (current) drug therapy: Secondary | ICD-10-CM | POA: Diagnosis not present

## 2022-03-12 DIAGNOSIS — R82998 Other abnormal findings in urine: Secondary | ICD-10-CM | POA: Diagnosis not present

## 2022-03-12 DIAGNOSIS — N2581 Secondary hyperparathyroidism of renal origin: Secondary | ICD-10-CM | POA: Diagnosis not present

## 2022-03-12 DIAGNOSIS — N186 End stage renal disease: Secondary | ICD-10-CM | POA: Diagnosis not present

## 2022-03-12 DIAGNOSIS — D509 Iron deficiency anemia, unspecified: Secondary | ICD-10-CM | POA: Diagnosis not present

## 2022-03-12 DIAGNOSIS — D631 Anemia in chronic kidney disease: Secondary | ICD-10-CM | POA: Diagnosis not present

## 2022-03-12 DIAGNOSIS — E875 Hyperkalemia: Secondary | ICD-10-CM | POA: Diagnosis not present

## 2022-03-12 DIAGNOSIS — Z992 Dependence on renal dialysis: Secondary | ICD-10-CM | POA: Diagnosis not present

## 2022-03-13 DIAGNOSIS — D631 Anemia in chronic kidney disease: Secondary | ICD-10-CM | POA: Diagnosis not present

## 2022-03-13 DIAGNOSIS — N186 End stage renal disease: Secondary | ICD-10-CM | POA: Diagnosis not present

## 2022-03-13 DIAGNOSIS — N2581 Secondary hyperparathyroidism of renal origin: Secondary | ICD-10-CM | POA: Diagnosis not present

## 2022-03-13 DIAGNOSIS — E875 Hyperkalemia: Secondary | ICD-10-CM | POA: Diagnosis not present

## 2022-03-13 DIAGNOSIS — Z992 Dependence on renal dialysis: Secondary | ICD-10-CM | POA: Diagnosis not present

## 2022-03-13 DIAGNOSIS — D509 Iron deficiency anemia, unspecified: Secondary | ICD-10-CM | POA: Diagnosis not present

## 2022-03-13 DIAGNOSIS — Z79899 Other long term (current) drug therapy: Secondary | ICD-10-CM | POA: Diagnosis not present

## 2022-03-13 DIAGNOSIS — G4733 Obstructive sleep apnea (adult) (pediatric): Secondary | ICD-10-CM | POA: Diagnosis not present

## 2022-03-13 DIAGNOSIS — R82998 Other abnormal findings in urine: Secondary | ICD-10-CM | POA: Diagnosis not present

## 2022-03-14 DIAGNOSIS — R82998 Other abnormal findings in urine: Secondary | ICD-10-CM | POA: Diagnosis not present

## 2022-03-14 DIAGNOSIS — D509 Iron deficiency anemia, unspecified: Secondary | ICD-10-CM | POA: Diagnosis not present

## 2022-03-14 DIAGNOSIS — E875 Hyperkalemia: Secondary | ICD-10-CM | POA: Diagnosis not present

## 2022-03-14 DIAGNOSIS — N186 End stage renal disease: Secondary | ICD-10-CM | POA: Diagnosis not present

## 2022-03-14 DIAGNOSIS — Z79899 Other long term (current) drug therapy: Secondary | ICD-10-CM | POA: Diagnosis not present

## 2022-03-14 DIAGNOSIS — N2581 Secondary hyperparathyroidism of renal origin: Secondary | ICD-10-CM | POA: Diagnosis not present

## 2022-03-14 DIAGNOSIS — Z992 Dependence on renal dialysis: Secondary | ICD-10-CM | POA: Diagnosis not present

## 2022-03-14 DIAGNOSIS — D631 Anemia in chronic kidney disease: Secondary | ICD-10-CM | POA: Diagnosis not present

## 2022-03-15 DIAGNOSIS — Z992 Dependence on renal dialysis: Secondary | ICD-10-CM | POA: Diagnosis not present

## 2022-03-15 DIAGNOSIS — N2581 Secondary hyperparathyroidism of renal origin: Secondary | ICD-10-CM | POA: Diagnosis not present

## 2022-03-15 DIAGNOSIS — N186 End stage renal disease: Secondary | ICD-10-CM | POA: Diagnosis not present

## 2022-03-15 DIAGNOSIS — Z79899 Other long term (current) drug therapy: Secondary | ICD-10-CM | POA: Diagnosis not present

## 2022-03-15 DIAGNOSIS — E875 Hyperkalemia: Secondary | ICD-10-CM | POA: Diagnosis not present

## 2022-03-15 DIAGNOSIS — D509 Iron deficiency anemia, unspecified: Secondary | ICD-10-CM | POA: Diagnosis not present

## 2022-03-15 DIAGNOSIS — D631 Anemia in chronic kidney disease: Secondary | ICD-10-CM | POA: Diagnosis not present

## 2022-03-15 DIAGNOSIS — R82998 Other abnormal findings in urine: Secondary | ICD-10-CM | POA: Diagnosis not present

## 2022-03-16 DIAGNOSIS — N186 End stage renal disease: Secondary | ICD-10-CM | POA: Diagnosis not present

## 2022-03-16 DIAGNOSIS — N2581 Secondary hyperparathyroidism of renal origin: Secondary | ICD-10-CM | POA: Diagnosis not present

## 2022-03-16 DIAGNOSIS — E875 Hyperkalemia: Secondary | ICD-10-CM | POA: Diagnosis not present

## 2022-03-16 DIAGNOSIS — D631 Anemia in chronic kidney disease: Secondary | ICD-10-CM | POA: Diagnosis not present

## 2022-03-16 DIAGNOSIS — Z992 Dependence on renal dialysis: Secondary | ICD-10-CM | POA: Diagnosis not present

## 2022-03-16 DIAGNOSIS — Z79899 Other long term (current) drug therapy: Secondary | ICD-10-CM | POA: Diagnosis not present

## 2022-03-16 DIAGNOSIS — D509 Iron deficiency anemia, unspecified: Secondary | ICD-10-CM | POA: Diagnosis not present

## 2022-03-16 DIAGNOSIS — R82998 Other abnormal findings in urine: Secondary | ICD-10-CM | POA: Diagnosis not present

## 2022-03-17 DIAGNOSIS — R82998 Other abnormal findings in urine: Secondary | ICD-10-CM | POA: Diagnosis not present

## 2022-03-17 DIAGNOSIS — N186 End stage renal disease: Secondary | ICD-10-CM | POA: Diagnosis not present

## 2022-03-17 DIAGNOSIS — E875 Hyperkalemia: Secondary | ICD-10-CM | POA: Diagnosis not present

## 2022-03-17 DIAGNOSIS — D631 Anemia in chronic kidney disease: Secondary | ICD-10-CM | POA: Diagnosis not present

## 2022-03-17 DIAGNOSIS — Z79899 Other long term (current) drug therapy: Secondary | ICD-10-CM | POA: Diagnosis not present

## 2022-03-17 DIAGNOSIS — N2581 Secondary hyperparathyroidism of renal origin: Secondary | ICD-10-CM | POA: Diagnosis not present

## 2022-03-17 DIAGNOSIS — D509 Iron deficiency anemia, unspecified: Secondary | ICD-10-CM | POA: Diagnosis not present

## 2022-03-17 DIAGNOSIS — Z992 Dependence on renal dialysis: Secondary | ICD-10-CM | POA: Diagnosis not present

## 2022-03-18 DIAGNOSIS — Z992 Dependence on renal dialysis: Secondary | ICD-10-CM | POA: Diagnosis not present

## 2022-03-18 DIAGNOSIS — N2581 Secondary hyperparathyroidism of renal origin: Secondary | ICD-10-CM | POA: Diagnosis not present

## 2022-03-18 DIAGNOSIS — E875 Hyperkalemia: Secondary | ICD-10-CM | POA: Diagnosis not present

## 2022-03-18 DIAGNOSIS — R82998 Other abnormal findings in urine: Secondary | ICD-10-CM | POA: Diagnosis not present

## 2022-03-18 DIAGNOSIS — N186 End stage renal disease: Secondary | ICD-10-CM | POA: Diagnosis not present

## 2022-03-18 DIAGNOSIS — D509 Iron deficiency anemia, unspecified: Secondary | ICD-10-CM | POA: Diagnosis not present

## 2022-03-18 DIAGNOSIS — Z79899 Other long term (current) drug therapy: Secondary | ICD-10-CM | POA: Diagnosis not present

## 2022-03-18 DIAGNOSIS — D631 Anemia in chronic kidney disease: Secondary | ICD-10-CM | POA: Diagnosis not present

## 2022-03-19 DIAGNOSIS — D631 Anemia in chronic kidney disease: Secondary | ICD-10-CM | POA: Diagnosis not present

## 2022-03-19 DIAGNOSIS — Z992 Dependence on renal dialysis: Secondary | ICD-10-CM | POA: Diagnosis not present

## 2022-03-19 DIAGNOSIS — Z79899 Other long term (current) drug therapy: Secondary | ICD-10-CM | POA: Diagnosis not present

## 2022-03-19 DIAGNOSIS — D509 Iron deficiency anemia, unspecified: Secondary | ICD-10-CM | POA: Diagnosis not present

## 2022-03-19 DIAGNOSIS — N2581 Secondary hyperparathyroidism of renal origin: Secondary | ICD-10-CM | POA: Diagnosis not present

## 2022-03-19 DIAGNOSIS — N186 End stage renal disease: Secondary | ICD-10-CM | POA: Diagnosis not present

## 2022-03-19 DIAGNOSIS — E875 Hyperkalemia: Secondary | ICD-10-CM | POA: Diagnosis not present

## 2022-03-19 DIAGNOSIS — R82998 Other abnormal findings in urine: Secondary | ICD-10-CM | POA: Diagnosis not present

## 2022-03-20 DIAGNOSIS — N2581 Secondary hyperparathyroidism of renal origin: Secondary | ICD-10-CM | POA: Diagnosis not present

## 2022-03-20 DIAGNOSIS — D631 Anemia in chronic kidney disease: Secondary | ICD-10-CM | POA: Diagnosis not present

## 2022-03-20 DIAGNOSIS — Z992 Dependence on renal dialysis: Secondary | ICD-10-CM | POA: Diagnosis not present

## 2022-03-20 DIAGNOSIS — D509 Iron deficiency anemia, unspecified: Secondary | ICD-10-CM | POA: Diagnosis not present

## 2022-03-20 DIAGNOSIS — E875 Hyperkalemia: Secondary | ICD-10-CM | POA: Diagnosis not present

## 2022-03-20 DIAGNOSIS — Z79899 Other long term (current) drug therapy: Secondary | ICD-10-CM | POA: Diagnosis not present

## 2022-03-20 DIAGNOSIS — N186 End stage renal disease: Secondary | ICD-10-CM | POA: Diagnosis not present

## 2022-03-20 DIAGNOSIS — R82998 Other abnormal findings in urine: Secondary | ICD-10-CM | POA: Diagnosis not present

## 2022-03-21 DIAGNOSIS — Z79899 Other long term (current) drug therapy: Secondary | ICD-10-CM | POA: Diagnosis not present

## 2022-03-21 DIAGNOSIS — R82998 Other abnormal findings in urine: Secondary | ICD-10-CM | POA: Diagnosis not present

## 2022-03-21 DIAGNOSIS — D509 Iron deficiency anemia, unspecified: Secondary | ICD-10-CM | POA: Diagnosis not present

## 2022-03-21 DIAGNOSIS — Z992 Dependence on renal dialysis: Secondary | ICD-10-CM | POA: Diagnosis not present

## 2022-03-21 DIAGNOSIS — D631 Anemia in chronic kidney disease: Secondary | ICD-10-CM | POA: Diagnosis not present

## 2022-03-21 DIAGNOSIS — E875 Hyperkalemia: Secondary | ICD-10-CM | POA: Diagnosis not present

## 2022-03-21 DIAGNOSIS — N186 End stage renal disease: Secondary | ICD-10-CM | POA: Diagnosis not present

## 2022-03-21 DIAGNOSIS — N2581 Secondary hyperparathyroidism of renal origin: Secondary | ICD-10-CM | POA: Diagnosis not present

## 2022-03-22 DIAGNOSIS — D509 Iron deficiency anemia, unspecified: Secondary | ICD-10-CM | POA: Diagnosis not present

## 2022-03-22 DIAGNOSIS — Z79899 Other long term (current) drug therapy: Secondary | ICD-10-CM | POA: Diagnosis not present

## 2022-03-22 DIAGNOSIS — Z992 Dependence on renal dialysis: Secondary | ICD-10-CM | POA: Diagnosis not present

## 2022-03-22 DIAGNOSIS — D631 Anemia in chronic kidney disease: Secondary | ICD-10-CM | POA: Diagnosis not present

## 2022-03-22 DIAGNOSIS — N2581 Secondary hyperparathyroidism of renal origin: Secondary | ICD-10-CM | POA: Diagnosis not present

## 2022-03-22 DIAGNOSIS — N186 End stage renal disease: Secondary | ICD-10-CM | POA: Diagnosis not present

## 2022-03-22 DIAGNOSIS — E875 Hyperkalemia: Secondary | ICD-10-CM | POA: Diagnosis not present

## 2022-03-22 DIAGNOSIS — R82998 Other abnormal findings in urine: Secondary | ICD-10-CM | POA: Diagnosis not present

## 2022-03-23 DIAGNOSIS — Z79899 Other long term (current) drug therapy: Secondary | ICD-10-CM | POA: Diagnosis not present

## 2022-03-23 DIAGNOSIS — N2581 Secondary hyperparathyroidism of renal origin: Secondary | ICD-10-CM | POA: Diagnosis not present

## 2022-03-23 DIAGNOSIS — Z992 Dependence on renal dialysis: Secondary | ICD-10-CM | POA: Diagnosis not present

## 2022-03-23 DIAGNOSIS — D509 Iron deficiency anemia, unspecified: Secondary | ICD-10-CM | POA: Diagnosis not present

## 2022-03-23 DIAGNOSIS — E875 Hyperkalemia: Secondary | ICD-10-CM | POA: Diagnosis not present

## 2022-03-23 DIAGNOSIS — D631 Anemia in chronic kidney disease: Secondary | ICD-10-CM | POA: Diagnosis not present

## 2022-03-23 DIAGNOSIS — R82998 Other abnormal findings in urine: Secondary | ICD-10-CM | POA: Diagnosis not present

## 2022-03-23 DIAGNOSIS — N186 End stage renal disease: Secondary | ICD-10-CM | POA: Diagnosis not present

## 2022-03-24 DIAGNOSIS — Z992 Dependence on renal dialysis: Secondary | ICD-10-CM | POA: Diagnosis not present

## 2022-03-24 DIAGNOSIS — R82998 Other abnormal findings in urine: Secondary | ICD-10-CM | POA: Diagnosis not present

## 2022-03-24 DIAGNOSIS — D509 Iron deficiency anemia, unspecified: Secondary | ICD-10-CM | POA: Diagnosis not present

## 2022-03-24 DIAGNOSIS — Z79899 Other long term (current) drug therapy: Secondary | ICD-10-CM | POA: Diagnosis not present

## 2022-03-24 DIAGNOSIS — E875 Hyperkalemia: Secondary | ICD-10-CM | POA: Diagnosis not present

## 2022-03-24 DIAGNOSIS — D631 Anemia in chronic kidney disease: Secondary | ICD-10-CM | POA: Diagnosis not present

## 2022-03-24 DIAGNOSIS — N186 End stage renal disease: Secondary | ICD-10-CM | POA: Diagnosis not present

## 2022-03-24 DIAGNOSIS — N2581 Secondary hyperparathyroidism of renal origin: Secondary | ICD-10-CM | POA: Diagnosis not present

## 2022-03-25 DIAGNOSIS — D631 Anemia in chronic kidney disease: Secondary | ICD-10-CM | POA: Diagnosis not present

## 2022-03-25 DIAGNOSIS — Z79899 Other long term (current) drug therapy: Secondary | ICD-10-CM | POA: Diagnosis not present

## 2022-03-25 DIAGNOSIS — E875 Hyperkalemia: Secondary | ICD-10-CM | POA: Diagnosis not present

## 2022-03-25 DIAGNOSIS — N186 End stage renal disease: Secondary | ICD-10-CM | POA: Diagnosis not present

## 2022-03-25 DIAGNOSIS — Z992 Dependence on renal dialysis: Secondary | ICD-10-CM | POA: Diagnosis not present

## 2022-03-25 DIAGNOSIS — D509 Iron deficiency anemia, unspecified: Secondary | ICD-10-CM | POA: Diagnosis not present

## 2022-03-25 DIAGNOSIS — R82998 Other abnormal findings in urine: Secondary | ICD-10-CM | POA: Diagnosis not present

## 2022-03-25 DIAGNOSIS — N2581 Secondary hyperparathyroidism of renal origin: Secondary | ICD-10-CM | POA: Diagnosis not present

## 2022-03-26 ENCOUNTER — Telehealth (INDEPENDENT_AMBULATORY_CARE_PROVIDER_SITE_OTHER): Payer: Medicare Other | Admitting: Internal Medicine

## 2022-03-26 VITALS — BP 150/89 | HR 85 | Temp 98.7°F | Resp 18 | Wt 183.0 lb

## 2022-03-26 DIAGNOSIS — Z13820 Encounter for screening for osteoporosis: Secondary | ICD-10-CM

## 2022-03-26 DIAGNOSIS — R569 Unspecified convulsions: Secondary | ICD-10-CM

## 2022-03-26 DIAGNOSIS — Z992 Dependence on renal dialysis: Secondary | ICD-10-CM

## 2022-03-26 DIAGNOSIS — I10 Essential (primary) hypertension: Secondary | ICD-10-CM

## 2022-03-26 DIAGNOSIS — D631 Anemia in chronic kidney disease: Secondary | ICD-10-CM | POA: Diagnosis not present

## 2022-03-26 DIAGNOSIS — N2581 Secondary hyperparathyroidism of renal origin: Secondary | ICD-10-CM | POA: Diagnosis not present

## 2022-03-26 DIAGNOSIS — Z79899 Other long term (current) drug therapy: Secondary | ICD-10-CM | POA: Diagnosis not present

## 2022-03-26 DIAGNOSIS — R82998 Other abnormal findings in urine: Secondary | ICD-10-CM | POA: Diagnosis not present

## 2022-03-26 DIAGNOSIS — N186 End stage renal disease: Secondary | ICD-10-CM

## 2022-03-26 DIAGNOSIS — E875 Hyperkalemia: Secondary | ICD-10-CM | POA: Diagnosis not present

## 2022-03-26 DIAGNOSIS — E118 Type 2 diabetes mellitus with unspecified complications: Secondary | ICD-10-CM

## 2022-03-26 DIAGNOSIS — D509 Iron deficiency anemia, unspecified: Secondary | ICD-10-CM | POA: Diagnosis not present

## 2022-03-26 DIAGNOSIS — Z780 Asymptomatic menopausal state: Secondary | ICD-10-CM

## 2022-03-26 LAB — BASIC METABOLIC PANEL: Glucose: 100

## 2022-03-26 MED ORDER — VENLAFAXINE HCL ER 150 MG PO CP24: 150.0000 mg | ORAL_CAPSULE | Freq: Every day | ORAL | 1 refills | Status: AC

## 2022-03-26 NOTE — Progress Notes (Signed)
? ? ?Virtual Visit via Video Note ? ?I connected with Felicia Thompson on 03/26/22 at  3:30 PM EDT by a video enabled telemedicine application and verified that I am speaking with the correct person using two identifiers. ? ?Location patient: home ?Location provider: work office ?Persons participating in the virtual visit: patient, provider ? ?I discussed the limitations of evaluation and management by telemedicine and the availability of in person appointments. The patient expressed understanding and agreed to proceed. ? ? ?HPI: ?This is a scheduled follow-up of chronic conditions.  Her past medical history is significant for Hypertension, hyperlipidemia, well-controlled diabetes, obstructive sleep apnea, depression, end-stage renal disease on nightly peritoneal dialysis.  She was hospitalized in January for a right lobar pneumonia with sepsis.  She has recovered well.  She continues to do peritoneal dialysis at nighttime.  Her nephrologist has recently increased her losartan from 50 to 100 mg daily due to elevated blood pressure.  We have performed medication reconciliation today.  She has been mixing up her blood pressure medication and has been taking Coreg 25 mg once daily instead of twice daily and instead has been taking losartan 100 mg twice daily as opposed to once daily.  Other than that she is feeling well and has no concerns.  Her endocrinologist is weaning her off the glipizide and she is down to 2.5 mg.  She is also overdue for DEXA scan and this will be ordered today.  Her recent A1c was 6.0. ? ? ?ROS: ?Constitutional: Denies fever, chills, diaphoresis, appetite change and fatigue.  ?HEENT: Denies photophobia, eye pain, redness, hearing loss, ear pain, congestion, sore throat, rhinorrhea, sneezing, mouth sores, trouble swallowing, neck pain, neck stiffness and tinnitus.   ?Respiratory: Denies SOB, DOE, cough, chest tightness,  and wheezing.   ?Cardiovascular: Denies chest pain, palpitations and  leg swelling.  ?Gastrointestinal: Denies nausea, vomiting, abdominal pain, diarrhea, constipation, blood in stool and abdominal distention.  ?Genitourinary: Denies dysuria, urgency, frequency, hematuria, flank pain and difficulty urinating.  ?Endocrine: Denies: hot or cold intolerance, sweats, changes in hair or nails, polyuria, polydipsia. ?Musculoskeletal: Denies myalgias, back pain, joint swelling, arthralgias and gait problem.  ?Skin: Denies pallor, rash and wound.  ?Neurological: Denies dizziness, seizures, syncope, weakness, light-headedness, numbness and headaches.  ?Hematological: Denies adenopathy. Easy bruising, personal or family bleeding history  ?Psychiatric/Behavioral: Denies suicidal ideation, mood changes, confusion, nervousness, sleep disturbance and agitation ? ? ?Past Medical History:  ?Diagnosis Date  ? Arthritis   ? Blood in stool   ? when appendix burst  ? Chronic kidney disease   ? Depression   ? DM (diabetes mellitus), type 2 with complications ([place])   ? ESRD on peritoneal dialysis [facility])   ? History of fainting spells of unknown cause   ? Hyperlipidemia   ? Hyperlipidemia associated with type 2 diabetes mellitus ([place])   ? Hypertension   ? OSA (obstructive sleep apnea)   ? ? ?Past Surgical History:  ?Procedure Laterality Date  ? ABDOMINAL HYSTERECTOMY    ? APPENDECTOMY    ? CHOLECYSTECTOMY    ? TONSILECTOMY, ADENOIDECTOMY, BILATERAL MYRINGOTOMY AND TUBES    ? ? ?Family History  ?Problem Relation Age of Onset  ? Hearing loss Father   ? Hyperlipidemia Father   ? Heart disease Father   ? Early death Father   ? Cancer Father   ? Early death Brother   ? Drug abuse Brother   ? Diabetes Brother   ? Depression Brother   ? Alcohol abuse  Brother   ? Miscarriages / [place] Daughter   ? Arthritis Daughter   ? Depression Daughter   ? Early death Maternal Grandmother   ? Heart attack Maternal Grandmother   ? Heart disease Maternal Grandmother   ? Hearing loss Paternal Grandmother   ? Heart disease  Paternal Grandmother   ? Heart disease Paternal Grandfather   ? Early death Paternal Grandfather   ? Heart attack Paternal Grandfather   ? ? ?SOCIAL HX:  ? reports that she has never smoked. She has never used smokeless tobacco. She reports that she does not drink alcohol and does not use drugs. ? ? ?Current Outpatient Medications:  ?  aspirin 81 MG chewable tablet, Chew 81 mg by mouth daily., Disp: , Rfl:  ?  carvedilol (COREG) 25 MG tablet, Take 25 mg by mouth 2 (two) times daily with a meal., Disp: , Rfl:  ?  glipiZIDE (GLUCOTROL) 5 MG tablet, Take 0.5 tablets (2.5 mg total) by mouth daily before breakfast., Disp: 30 tablet, Rfl: 3 ?  insulin NPH-regular Human (HUMULIN 70/30) (70-30) 100 UNIT/ML injection, Inject 10 Units into the skin daily with breakfast. (Patient taking differently: Inject 10 Units into the skin at bedtime.), Disp: 30 mL, Rfl: 11 ?  losartan (COZAAR) 50 MG tablet, Take 1 tablet (50 mg total) by mouth daily. (Patient taking differently: Take 50 mg by mouth 2 (two) times daily.), Disp: 30 tablet, Rfl: 3 ?  Multiple Vitamins-Minerals (RENAPLEX) TABS, Take 1 tablet by mouth daily., Disp: , Rfl:  ?  Probiotic Product ([employer]S ADV PROBIOTIC GUMMIES PO), Take 1 tablet by mouth at bedtime., Disp: , Rfl:  ?  sevelamer carbonate (RENVELA) 800 MG tablet, Take 2,400 mg by mouth 3 (three) times daily with meals., Disp: , Rfl:  ?  venlafaxine XR (EFFEXOR-XR) 150 MG 24 hr capsule, Take 1 capsule (150 mg total) by mouth daily with breakfast., Disp: 90 capsule, Rfl: 1 ? ?EXAM:  ? ?VITALS per patient if applicable: Blood pressure 150/89, pulse rate 85, temperature 98.7, 96% on room air, she weighs 183 pounds. ? ?GENERAL: alert, oriented, appears well and in no acute distress ? ?HEENT: atraumatic, conjunttiva clear, no obvious abnormalities on inspection of external nose and ears ? ?NECK: normal movements of the head and neck ? ?LUNGS: on inspection no signs of respiratory distress, breathing rate appears normal, no  obvious gross increased work of breathing, gasping or wheezing ? ?CV: no obvious cyanosis ? ?MS: moves all visible extremities without noticeable abnormality ? ?PSYCH/NEURO: pleasant and cooperative, no obvious depression or anxiety, speech and thought processing grossly intact ? ?ASSESSMENT AND PLAN: ? ? ?Encounter for osteoporosis screening in asymptomatic postmenopausal patient  ?- Plan: DG Bone Density ? ?ESRD on peritoneal dialysis [facility]) ?-Followed closely by nephrology ? ?Primary hypertension ?-Uncontrolled, have corrected errors in medication administration, discussed with both patient and daughter. ? ?DM (diabetes mellitus), type 2 with complications ([place]) ?-Well-controlled with a recent A1c of 6.0. ? ?Seizure ([place]) ? - Plan: venlafaxine XR (EFFEXOR-XR) 150 MG 24 hr capsule ? ?Time spent: 30 minutes reviewing chart, interviewing patient and daughter, formulating plan of care. ?  ?I discussed the assessment and treatment plan with the patient. The patient was provided an opportunity to ask questions and all were answered. The patient agreed with the plan and demonstrated an understanding of the instructions. ?  ?The patient was advised to call back or seek an in-person evaluation if the symptoms worsen or if the condition fails to improve as  anticipated. ? ? ? ?Lelon Frohlich, MD  ?[facility] ? ?

## 2022-03-27 DIAGNOSIS — N2581 Secondary hyperparathyroidism of renal origin: Secondary | ICD-10-CM | POA: Diagnosis not present

## 2022-03-27 DIAGNOSIS — D509 Iron deficiency anemia, unspecified: Secondary | ICD-10-CM | POA: Diagnosis not present

## 2022-03-27 DIAGNOSIS — E875 Hyperkalemia: Secondary | ICD-10-CM | POA: Diagnosis not present

## 2022-03-27 DIAGNOSIS — R82998 Other abnormal findings in urine: Secondary | ICD-10-CM | POA: Diagnosis not present

## 2022-03-27 DIAGNOSIS — D631 Anemia in chronic kidney disease: Secondary | ICD-10-CM | POA: Diagnosis not present

## 2022-03-27 DIAGNOSIS — N186 End stage renal disease: Secondary | ICD-10-CM | POA: Diagnosis not present

## 2022-03-27 DIAGNOSIS — Z79899 Other long term (current) drug therapy: Secondary | ICD-10-CM | POA: Diagnosis not present

## 2022-03-27 DIAGNOSIS — Z992 Dependence on renal dialysis: Secondary | ICD-10-CM | POA: Diagnosis not present

## 2022-03-28 DIAGNOSIS — R82998 Other abnormal findings in urine: Secondary | ICD-10-CM | POA: Diagnosis not present

## 2022-03-28 DIAGNOSIS — N2581 Secondary hyperparathyroidism of renal origin: Secondary | ICD-10-CM | POA: Diagnosis not present

## 2022-03-28 DIAGNOSIS — Z992 Dependence on renal dialysis: Secondary | ICD-10-CM | POA: Diagnosis not present

## 2022-03-28 DIAGNOSIS — D631 Anemia in chronic kidney disease: Secondary | ICD-10-CM | POA: Diagnosis not present

## 2022-03-28 DIAGNOSIS — E875 Hyperkalemia: Secondary | ICD-10-CM | POA: Diagnosis not present

## 2022-03-28 DIAGNOSIS — N186 End stage renal disease: Secondary | ICD-10-CM | POA: Diagnosis not present

## 2022-03-28 DIAGNOSIS — Z79899 Other long term (current) drug therapy: Secondary | ICD-10-CM | POA: Diagnosis not present

## 2022-03-28 DIAGNOSIS — D509 Iron deficiency anemia, unspecified: Secondary | ICD-10-CM | POA: Diagnosis not present

## 2022-03-29 DIAGNOSIS — D509 Iron deficiency anemia, unspecified: Secondary | ICD-10-CM | POA: Diagnosis not present

## 2022-03-29 DIAGNOSIS — Z992 Dependence on renal dialysis: Secondary | ICD-10-CM | POA: Diagnosis not present

## 2022-03-29 DIAGNOSIS — N2581 Secondary hyperparathyroidism of renal origin: Secondary | ICD-10-CM | POA: Diagnosis not present

## 2022-03-29 DIAGNOSIS — N186 End stage renal disease: Secondary | ICD-10-CM | POA: Diagnosis not present

## 2022-03-29 DIAGNOSIS — E875 Hyperkalemia: Secondary | ICD-10-CM | POA: Diagnosis not present

## 2022-03-29 DIAGNOSIS — R82998 Other abnormal findings in urine: Secondary | ICD-10-CM | POA: Diagnosis not present

## 2022-03-29 DIAGNOSIS — Z79899 Other long term (current) drug therapy: Secondary | ICD-10-CM | POA: Diagnosis not present

## 2022-03-29 DIAGNOSIS — D631 Anemia in chronic kidney disease: Secondary | ICD-10-CM | POA: Diagnosis not present

## 2022-03-30 DIAGNOSIS — R82998 Other abnormal findings in urine: Secondary | ICD-10-CM | POA: Diagnosis not present

## 2022-03-30 DIAGNOSIS — Z79899 Other long term (current) drug therapy: Secondary | ICD-10-CM | POA: Diagnosis not present

## 2022-03-30 DIAGNOSIS — N2581 Secondary hyperparathyroidism of renal origin: Secondary | ICD-10-CM | POA: Diagnosis not present

## 2022-03-30 DIAGNOSIS — N186 End stage renal disease: Secondary | ICD-10-CM | POA: Diagnosis not present

## 2022-03-30 DIAGNOSIS — Z992 Dependence on renal dialysis: Secondary | ICD-10-CM | POA: Diagnosis not present

## 2022-03-30 DIAGNOSIS — E875 Hyperkalemia: Secondary | ICD-10-CM | POA: Diagnosis not present

## 2022-03-30 DIAGNOSIS — D509 Iron deficiency anemia, unspecified: Secondary | ICD-10-CM | POA: Diagnosis not present

## 2022-03-30 DIAGNOSIS — D631 Anemia in chronic kidney disease: Secondary | ICD-10-CM | POA: Diagnosis not present

## 2022-03-31 DIAGNOSIS — D631 Anemia in chronic kidney disease: Secondary | ICD-10-CM | POA: Diagnosis not present

## 2022-03-31 DIAGNOSIS — N186 End stage renal disease: Secondary | ICD-10-CM | POA: Diagnosis not present

## 2022-03-31 DIAGNOSIS — E875 Hyperkalemia: Secondary | ICD-10-CM | POA: Diagnosis not present

## 2022-03-31 DIAGNOSIS — D509 Iron deficiency anemia, unspecified: Secondary | ICD-10-CM | POA: Diagnosis not present

## 2022-03-31 DIAGNOSIS — N2581 Secondary hyperparathyroidism of renal origin: Secondary | ICD-10-CM | POA: Diagnosis not present

## 2022-03-31 DIAGNOSIS — Z992 Dependence on renal dialysis: Secondary | ICD-10-CM | POA: Diagnosis not present

## 2022-03-31 DIAGNOSIS — Z79899 Other long term (current) drug therapy: Secondary | ICD-10-CM | POA: Diagnosis not present

## 2022-03-31 DIAGNOSIS — R82998 Other abnormal findings in urine: Secondary | ICD-10-CM | POA: Diagnosis not present

## 2022-04-01 DIAGNOSIS — D509 Iron deficiency anemia, unspecified: Secondary | ICD-10-CM | POA: Diagnosis not present

## 2022-04-01 DIAGNOSIS — Z23 Encounter for immunization: Secondary | ICD-10-CM | POA: Diagnosis not present

## 2022-04-01 DIAGNOSIS — N186 End stage renal disease: Secondary | ICD-10-CM | POA: Diagnosis not present

## 2022-04-01 DIAGNOSIS — E875 Hyperkalemia: Secondary | ICD-10-CM | POA: Diagnosis not present

## 2022-04-01 DIAGNOSIS — N2581 Secondary hyperparathyroidism of renal origin: Secondary | ICD-10-CM | POA: Diagnosis not present

## 2022-04-01 DIAGNOSIS — Z992 Dependence on renal dialysis: Secondary | ICD-10-CM | POA: Diagnosis not present

## 2022-04-01 DIAGNOSIS — R82998 Other abnormal findings in urine: Secondary | ICD-10-CM | POA: Diagnosis not present

## 2022-04-01 DIAGNOSIS — Z79899 Other long term (current) drug therapy: Secondary | ICD-10-CM | POA: Diagnosis not present

## 2022-04-01 DIAGNOSIS — D631 Anemia in chronic kidney disease: Secondary | ICD-10-CM | POA: Diagnosis not present

## 2022-04-02 DIAGNOSIS — Z992 Dependence on renal dialysis: Secondary | ICD-10-CM | POA: Diagnosis not present

## 2022-04-02 DIAGNOSIS — N186 End stage renal disease: Secondary | ICD-10-CM | POA: Diagnosis not present

## 2022-04-02 DIAGNOSIS — D631 Anemia in chronic kidney disease: Secondary | ICD-10-CM | POA: Diagnosis not present

## 2022-04-02 DIAGNOSIS — Z79899 Other long term (current) drug therapy: Secondary | ICD-10-CM | POA: Diagnosis not present

## 2022-04-02 DIAGNOSIS — Z23 Encounter for immunization: Secondary | ICD-10-CM | POA: Diagnosis not present

## 2022-04-02 DIAGNOSIS — E875 Hyperkalemia: Secondary | ICD-10-CM | POA: Diagnosis not present

## 2022-04-02 DIAGNOSIS — R82998 Other abnormal findings in urine: Secondary | ICD-10-CM | POA: Diagnosis not present

## 2022-04-02 DIAGNOSIS — D509 Iron deficiency anemia, unspecified: Secondary | ICD-10-CM | POA: Diagnosis not present

## 2022-04-02 DIAGNOSIS — N2581 Secondary hyperparathyroidism of renal origin: Secondary | ICD-10-CM | POA: Diagnosis not present

## 2022-04-03 DIAGNOSIS — R82998 Other abnormal findings in urine: Secondary | ICD-10-CM | POA: Diagnosis not present

## 2022-04-03 DIAGNOSIS — N186 End stage renal disease: Secondary | ICD-10-CM | POA: Diagnosis not present

## 2022-04-03 DIAGNOSIS — D631 Anemia in chronic kidney disease: Secondary | ICD-10-CM | POA: Diagnosis not present

## 2022-04-03 DIAGNOSIS — N2581 Secondary hyperparathyroidism of renal origin: Secondary | ICD-10-CM | POA: Diagnosis not present

## 2022-04-03 DIAGNOSIS — D509 Iron deficiency anemia, unspecified: Secondary | ICD-10-CM | POA: Diagnosis not present

## 2022-04-03 DIAGNOSIS — Z992 Dependence on renal dialysis: Secondary | ICD-10-CM | POA: Diagnosis not present

## 2022-04-03 DIAGNOSIS — Z79899 Other long term (current) drug therapy: Secondary | ICD-10-CM | POA: Diagnosis not present

## 2022-04-03 DIAGNOSIS — E875 Hyperkalemia: Secondary | ICD-10-CM | POA: Diagnosis not present

## 2022-04-03 DIAGNOSIS — Z23 Encounter for immunization: Secondary | ICD-10-CM | POA: Diagnosis not present

## 2022-04-04 DIAGNOSIS — D509 Iron deficiency anemia, unspecified: Secondary | ICD-10-CM | POA: Diagnosis not present

## 2022-04-04 DIAGNOSIS — R82998 Other abnormal findings in urine: Secondary | ICD-10-CM | POA: Diagnosis not present

## 2022-04-04 DIAGNOSIS — Z992 Dependence on renal dialysis: Secondary | ICD-10-CM | POA: Diagnosis not present

## 2022-04-04 DIAGNOSIS — N2581 Secondary hyperparathyroidism of renal origin: Secondary | ICD-10-CM | POA: Diagnosis not present

## 2022-04-04 DIAGNOSIS — Z79899 Other long term (current) drug therapy: Secondary | ICD-10-CM | POA: Diagnosis not present

## 2022-04-04 DIAGNOSIS — E875 Hyperkalemia: Secondary | ICD-10-CM | POA: Diagnosis not present

## 2022-04-04 DIAGNOSIS — N186 End stage renal disease: Secondary | ICD-10-CM | POA: Diagnosis not present

## 2022-04-04 DIAGNOSIS — D631 Anemia in chronic kidney disease: Secondary | ICD-10-CM | POA: Diagnosis not present

## 2022-04-04 DIAGNOSIS — Z23 Encounter for immunization: Secondary | ICD-10-CM | POA: Diagnosis not present

## 2022-04-05 DIAGNOSIS — Z23 Encounter for immunization: Secondary | ICD-10-CM | POA: Diagnosis not present

## 2022-04-05 DIAGNOSIS — D631 Anemia in chronic kidney disease: Secondary | ICD-10-CM | POA: Diagnosis not present

## 2022-04-05 DIAGNOSIS — Z992 Dependence on renal dialysis: Secondary | ICD-10-CM | POA: Diagnosis not present

## 2022-04-05 DIAGNOSIS — N186 End stage renal disease: Secondary | ICD-10-CM | POA: Diagnosis not present

## 2022-04-05 DIAGNOSIS — N2581 Secondary hyperparathyroidism of renal origin: Secondary | ICD-10-CM | POA: Diagnosis not present

## 2022-04-05 DIAGNOSIS — D509 Iron deficiency anemia, unspecified: Secondary | ICD-10-CM | POA: Diagnosis not present

## 2022-04-05 DIAGNOSIS — E875 Hyperkalemia: Secondary | ICD-10-CM | POA: Diagnosis not present

## 2022-04-05 DIAGNOSIS — R82998 Other abnormal findings in urine: Secondary | ICD-10-CM | POA: Diagnosis not present

## 2022-04-05 DIAGNOSIS — Z79899 Other long term (current) drug therapy: Secondary | ICD-10-CM | POA: Diagnosis not present

## 2022-04-06 DIAGNOSIS — Z23 Encounter for immunization: Secondary | ICD-10-CM | POA: Diagnosis not present

## 2022-04-06 DIAGNOSIS — N186 End stage renal disease: Secondary | ICD-10-CM | POA: Diagnosis not present

## 2022-04-06 DIAGNOSIS — D509 Iron deficiency anemia, unspecified: Secondary | ICD-10-CM | POA: Diagnosis not present

## 2022-04-06 DIAGNOSIS — R82998 Other abnormal findings in urine: Secondary | ICD-10-CM | POA: Diagnosis not present

## 2022-04-06 DIAGNOSIS — N2581 Secondary hyperparathyroidism of renal origin: Secondary | ICD-10-CM | POA: Diagnosis not present

## 2022-04-06 DIAGNOSIS — D631 Anemia in chronic kidney disease: Secondary | ICD-10-CM | POA: Diagnosis not present

## 2022-04-06 DIAGNOSIS — Z992 Dependence on renal dialysis: Secondary | ICD-10-CM | POA: Diagnosis not present

## 2022-04-06 DIAGNOSIS — Z79899 Other long term (current) drug therapy: Secondary | ICD-10-CM | POA: Diagnosis not present

## 2022-04-06 DIAGNOSIS — E875 Hyperkalemia: Secondary | ICD-10-CM | POA: Diagnosis not present

## 2022-04-07 DIAGNOSIS — R82998 Other abnormal findings in urine: Secondary | ICD-10-CM | POA: Diagnosis not present

## 2022-04-07 DIAGNOSIS — E875 Hyperkalemia: Secondary | ICD-10-CM | POA: Diagnosis not present

## 2022-04-07 DIAGNOSIS — Z23 Encounter for immunization: Secondary | ICD-10-CM | POA: Diagnosis not present

## 2022-04-07 DIAGNOSIS — D509 Iron deficiency anemia, unspecified: Secondary | ICD-10-CM | POA: Diagnosis not present

## 2022-04-07 DIAGNOSIS — N2581 Secondary hyperparathyroidism of renal origin: Secondary | ICD-10-CM | POA: Diagnosis not present

## 2022-04-07 DIAGNOSIS — D631 Anemia in chronic kidney disease: Secondary | ICD-10-CM | POA: Diagnosis not present

## 2022-04-07 DIAGNOSIS — N186 End stage renal disease: Secondary | ICD-10-CM | POA: Diagnosis not present

## 2022-04-07 DIAGNOSIS — Z79899 Other long term (current) drug therapy: Secondary | ICD-10-CM | POA: Diagnosis not present

## 2022-04-07 DIAGNOSIS — Z992 Dependence on renal dialysis: Secondary | ICD-10-CM | POA: Diagnosis not present

## 2022-04-08 DIAGNOSIS — E875 Hyperkalemia: Secondary | ICD-10-CM | POA: Diagnosis not present

## 2022-04-08 DIAGNOSIS — D631 Anemia in chronic kidney disease: Secondary | ICD-10-CM | POA: Diagnosis not present

## 2022-04-08 DIAGNOSIS — N2581 Secondary hyperparathyroidism of renal origin: Secondary | ICD-10-CM | POA: Diagnosis not present

## 2022-04-08 DIAGNOSIS — D509 Iron deficiency anemia, unspecified: Secondary | ICD-10-CM | POA: Diagnosis not present

## 2022-04-08 DIAGNOSIS — Z992 Dependence on renal dialysis: Secondary | ICD-10-CM | POA: Diagnosis not present

## 2022-04-08 DIAGNOSIS — N186 End stage renal disease: Secondary | ICD-10-CM | POA: Diagnosis not present

## 2022-04-08 DIAGNOSIS — Z23 Encounter for immunization: Secondary | ICD-10-CM | POA: Diagnosis not present

## 2022-04-08 DIAGNOSIS — R82998 Other abnormal findings in urine: Secondary | ICD-10-CM | POA: Diagnosis not present

## 2022-04-08 DIAGNOSIS — Z79899 Other long term (current) drug therapy: Secondary | ICD-10-CM | POA: Diagnosis not present

## 2022-04-09 DIAGNOSIS — Z23 Encounter for immunization: Secondary | ICD-10-CM | POA: Diagnosis not present

## 2022-04-09 DIAGNOSIS — R82998 Other abnormal findings in urine: Secondary | ICD-10-CM | POA: Diagnosis not present

## 2022-04-09 DIAGNOSIS — Z79899 Other long term (current) drug therapy: Secondary | ICD-10-CM | POA: Diagnosis not present

## 2022-04-09 DIAGNOSIS — E875 Hyperkalemia: Secondary | ICD-10-CM | POA: Diagnosis not present

## 2022-04-09 DIAGNOSIS — Z992 Dependence on renal dialysis: Secondary | ICD-10-CM | POA: Diagnosis not present

## 2022-04-09 DIAGNOSIS — D631 Anemia in chronic kidney disease: Secondary | ICD-10-CM | POA: Diagnosis not present

## 2022-04-09 DIAGNOSIS — D509 Iron deficiency anemia, unspecified: Secondary | ICD-10-CM | POA: Diagnosis not present

## 2022-04-09 DIAGNOSIS — N186 End stage renal disease: Secondary | ICD-10-CM | POA: Diagnosis not present

## 2022-04-09 DIAGNOSIS — N2581 Secondary hyperparathyroidism of renal origin: Secondary | ICD-10-CM | POA: Diagnosis not present

## 2022-04-10 DIAGNOSIS — E875 Hyperkalemia: Secondary | ICD-10-CM | POA: Diagnosis not present

## 2022-04-10 DIAGNOSIS — D509 Iron deficiency anemia, unspecified: Secondary | ICD-10-CM | POA: Diagnosis not present

## 2022-04-10 DIAGNOSIS — Z992 Dependence on renal dialysis: Secondary | ICD-10-CM | POA: Diagnosis not present

## 2022-04-10 DIAGNOSIS — D631 Anemia in chronic kidney disease: Secondary | ICD-10-CM | POA: Diagnosis not present

## 2022-04-10 DIAGNOSIS — Z23 Encounter for immunization: Secondary | ICD-10-CM | POA: Diagnosis not present

## 2022-04-10 DIAGNOSIS — N186 End stage renal disease: Secondary | ICD-10-CM | POA: Diagnosis not present

## 2022-04-10 DIAGNOSIS — N2581 Secondary hyperparathyroidism of renal origin: Secondary | ICD-10-CM | POA: Diagnosis not present

## 2022-04-10 DIAGNOSIS — R82998 Other abnormal findings in urine: Secondary | ICD-10-CM | POA: Diagnosis not present

## 2022-04-10 DIAGNOSIS — Z79899 Other long term (current) drug therapy: Secondary | ICD-10-CM | POA: Diagnosis not present

## 2022-04-11 DIAGNOSIS — Z992 Dependence on renal dialysis: Secondary | ICD-10-CM | POA: Diagnosis not present

## 2022-04-11 DIAGNOSIS — E875 Hyperkalemia: Secondary | ICD-10-CM | POA: Diagnosis not present

## 2022-04-11 DIAGNOSIS — D509 Iron deficiency anemia, unspecified: Secondary | ICD-10-CM | POA: Diagnosis not present

## 2022-04-11 DIAGNOSIS — D631 Anemia in chronic kidney disease: Secondary | ICD-10-CM | POA: Diagnosis not present

## 2022-04-11 DIAGNOSIS — N186 End stage renal disease: Secondary | ICD-10-CM | POA: Diagnosis not present

## 2022-04-11 DIAGNOSIS — N2581 Secondary hyperparathyroidism of renal origin: Secondary | ICD-10-CM | POA: Diagnosis not present

## 2022-04-11 DIAGNOSIS — Z23 Encounter for immunization: Secondary | ICD-10-CM | POA: Diagnosis not present

## 2022-04-11 DIAGNOSIS — R82998 Other abnormal findings in urine: Secondary | ICD-10-CM | POA: Diagnosis not present

## 2022-04-11 DIAGNOSIS — Z79899 Other long term (current) drug therapy: Secondary | ICD-10-CM | POA: Diagnosis not present

## 2022-04-12 DIAGNOSIS — Z79899 Other long term (current) drug therapy: Secondary | ICD-10-CM | POA: Diagnosis not present

## 2022-04-12 DIAGNOSIS — N2581 Secondary hyperparathyroidism of renal origin: Secondary | ICD-10-CM | POA: Diagnosis not present

## 2022-04-12 DIAGNOSIS — G4733 Obstructive sleep apnea (adult) (pediatric): Secondary | ICD-10-CM | POA: Diagnosis not present

## 2022-04-12 DIAGNOSIS — D509 Iron deficiency anemia, unspecified: Secondary | ICD-10-CM | POA: Diagnosis not present

## 2022-04-12 DIAGNOSIS — Z992 Dependence on renal dialysis: Secondary | ICD-10-CM | POA: Diagnosis not present

## 2022-04-12 DIAGNOSIS — N186 End stage renal disease: Secondary | ICD-10-CM | POA: Diagnosis not present

## 2022-04-12 DIAGNOSIS — E875 Hyperkalemia: Secondary | ICD-10-CM | POA: Diagnosis not present

## 2022-04-12 DIAGNOSIS — Z23 Encounter for immunization: Secondary | ICD-10-CM | POA: Diagnosis not present

## 2022-04-12 DIAGNOSIS — R82998 Other abnormal findings in urine: Secondary | ICD-10-CM | POA: Diagnosis not present

## 2022-04-12 DIAGNOSIS — D631 Anemia in chronic kidney disease: Secondary | ICD-10-CM | POA: Diagnosis not present

## 2022-04-13 DIAGNOSIS — R82998 Other abnormal findings in urine: Secondary | ICD-10-CM | POA: Diagnosis not present

## 2022-04-13 DIAGNOSIS — E875 Hyperkalemia: Secondary | ICD-10-CM | POA: Diagnosis not present

## 2022-04-13 DIAGNOSIS — Z79899 Other long term (current) drug therapy: Secondary | ICD-10-CM | POA: Diagnosis not present

## 2022-04-13 DIAGNOSIS — N2581 Secondary hyperparathyroidism of renal origin: Secondary | ICD-10-CM | POA: Diagnosis not present

## 2022-04-13 DIAGNOSIS — N186 End stage renal disease: Secondary | ICD-10-CM | POA: Diagnosis not present

## 2022-04-13 DIAGNOSIS — D509 Iron deficiency anemia, unspecified: Secondary | ICD-10-CM | POA: Diagnosis not present

## 2022-04-13 DIAGNOSIS — D631 Anemia in chronic kidney disease: Secondary | ICD-10-CM | POA: Diagnosis not present

## 2022-04-13 DIAGNOSIS — Z992 Dependence on renal dialysis: Secondary | ICD-10-CM | POA: Diagnosis not present

## 2022-04-13 DIAGNOSIS — Z23 Encounter for immunization: Secondary | ICD-10-CM | POA: Diagnosis not present

## 2022-04-14 DIAGNOSIS — Z23 Encounter for immunization: Secondary | ICD-10-CM | POA: Diagnosis not present

## 2022-04-14 DIAGNOSIS — N2581 Secondary hyperparathyroidism of renal origin: Secondary | ICD-10-CM | POA: Diagnosis not present

## 2022-04-14 DIAGNOSIS — N186 End stage renal disease: Secondary | ICD-10-CM | POA: Diagnosis not present

## 2022-04-14 DIAGNOSIS — D631 Anemia in chronic kidney disease: Secondary | ICD-10-CM | POA: Diagnosis not present

## 2022-04-14 DIAGNOSIS — D509 Iron deficiency anemia, unspecified: Secondary | ICD-10-CM | POA: Diagnosis not present

## 2022-04-14 DIAGNOSIS — R82998 Other abnormal findings in urine: Secondary | ICD-10-CM | POA: Diagnosis not present

## 2022-04-14 DIAGNOSIS — Z79899 Other long term (current) drug therapy: Secondary | ICD-10-CM | POA: Diagnosis not present

## 2022-04-14 DIAGNOSIS — E875 Hyperkalemia: Secondary | ICD-10-CM | POA: Diagnosis not present

## 2022-04-14 DIAGNOSIS — Z992 Dependence on renal dialysis: Secondary | ICD-10-CM | POA: Diagnosis not present

## 2022-04-15 DIAGNOSIS — R82998 Other abnormal findings in urine: Secondary | ICD-10-CM | POA: Diagnosis not present

## 2022-04-15 DIAGNOSIS — D509 Iron deficiency anemia, unspecified: Secondary | ICD-10-CM | POA: Diagnosis not present

## 2022-04-15 DIAGNOSIS — N2581 Secondary hyperparathyroidism of renal origin: Secondary | ICD-10-CM | POA: Diagnosis not present

## 2022-04-15 DIAGNOSIS — Z992 Dependence on renal dialysis: Secondary | ICD-10-CM | POA: Diagnosis not present

## 2022-04-15 DIAGNOSIS — Z23 Encounter for immunization: Secondary | ICD-10-CM | POA: Diagnosis not present

## 2022-04-15 DIAGNOSIS — Z79899 Other long term (current) drug therapy: Secondary | ICD-10-CM | POA: Diagnosis not present

## 2022-04-15 DIAGNOSIS — E875 Hyperkalemia: Secondary | ICD-10-CM | POA: Diagnosis not present

## 2022-04-15 DIAGNOSIS — D631 Anemia in chronic kidney disease: Secondary | ICD-10-CM | POA: Diagnosis not present

## 2022-04-15 DIAGNOSIS — N186 End stage renal disease: Secondary | ICD-10-CM | POA: Diagnosis not present

## 2022-04-16 DIAGNOSIS — N186 End stage renal disease: Secondary | ICD-10-CM | POA: Diagnosis not present

## 2022-04-16 DIAGNOSIS — N2581 Secondary hyperparathyroidism of renal origin: Secondary | ICD-10-CM | POA: Diagnosis not present

## 2022-04-16 DIAGNOSIS — Z23 Encounter for immunization: Secondary | ICD-10-CM | POA: Diagnosis not present

## 2022-04-16 DIAGNOSIS — E875 Hyperkalemia: Secondary | ICD-10-CM | POA: Diagnosis not present

## 2022-04-16 DIAGNOSIS — Z79899 Other long term (current) drug therapy: Secondary | ICD-10-CM | POA: Diagnosis not present

## 2022-04-16 DIAGNOSIS — Z992 Dependence on renal dialysis: Secondary | ICD-10-CM | POA: Diagnosis not present

## 2022-04-16 DIAGNOSIS — R82998 Other abnormal findings in urine: Secondary | ICD-10-CM | POA: Diagnosis not present

## 2022-04-16 DIAGNOSIS — D631 Anemia in chronic kidney disease: Secondary | ICD-10-CM | POA: Diagnosis not present

## 2022-04-16 DIAGNOSIS — D509 Iron deficiency anemia, unspecified: Secondary | ICD-10-CM | POA: Diagnosis not present

## 2022-04-17 DIAGNOSIS — D631 Anemia in chronic kidney disease: Secondary | ICD-10-CM | POA: Diagnosis not present

## 2022-04-17 DIAGNOSIS — Z79899 Other long term (current) drug therapy: Secondary | ICD-10-CM | POA: Diagnosis not present

## 2022-04-17 DIAGNOSIS — E875 Hyperkalemia: Secondary | ICD-10-CM | POA: Diagnosis not present

## 2022-04-17 DIAGNOSIS — R82998 Other abnormal findings in urine: Secondary | ICD-10-CM | POA: Diagnosis not present

## 2022-04-17 DIAGNOSIS — N2581 Secondary hyperparathyroidism of renal origin: Secondary | ICD-10-CM | POA: Diagnosis not present

## 2022-04-17 DIAGNOSIS — Z23 Encounter for immunization: Secondary | ICD-10-CM | POA: Diagnosis not present

## 2022-04-17 DIAGNOSIS — N186 End stage renal disease: Secondary | ICD-10-CM | POA: Diagnosis not present

## 2022-04-17 DIAGNOSIS — Z992 Dependence on renal dialysis: Secondary | ICD-10-CM | POA: Diagnosis not present

## 2022-04-17 DIAGNOSIS — D509 Iron deficiency anemia, unspecified: Secondary | ICD-10-CM | POA: Diagnosis not present

## 2022-04-18 DIAGNOSIS — N186 End stage renal disease: Secondary | ICD-10-CM | POA: Diagnosis not present

## 2022-04-18 DIAGNOSIS — R82998 Other abnormal findings in urine: Secondary | ICD-10-CM | POA: Diagnosis not present

## 2022-04-18 DIAGNOSIS — Z79899 Other long term (current) drug therapy: Secondary | ICD-10-CM | POA: Diagnosis not present

## 2022-04-18 DIAGNOSIS — Z992 Dependence on renal dialysis: Secondary | ICD-10-CM | POA: Diagnosis not present

## 2022-04-18 DIAGNOSIS — E875 Hyperkalemia: Secondary | ICD-10-CM | POA: Diagnosis not present

## 2022-04-18 DIAGNOSIS — Z23 Encounter for immunization: Secondary | ICD-10-CM | POA: Diagnosis not present

## 2022-04-18 DIAGNOSIS — D509 Iron deficiency anemia, unspecified: Secondary | ICD-10-CM | POA: Diagnosis not present

## 2022-04-18 DIAGNOSIS — N2581 Secondary hyperparathyroidism of renal origin: Secondary | ICD-10-CM | POA: Diagnosis not present

## 2022-04-18 DIAGNOSIS — D631 Anemia in chronic kidney disease: Secondary | ICD-10-CM | POA: Diagnosis not present

## 2022-04-19 DIAGNOSIS — D631 Anemia in chronic kidney disease: Secondary | ICD-10-CM | POA: Diagnosis not present

## 2022-04-19 DIAGNOSIS — E875 Hyperkalemia: Secondary | ICD-10-CM | POA: Diagnosis not present

## 2022-04-19 DIAGNOSIS — N2581 Secondary hyperparathyroidism of renal origin: Secondary | ICD-10-CM | POA: Diagnosis not present

## 2022-04-19 DIAGNOSIS — Z23 Encounter for immunization: Secondary | ICD-10-CM | POA: Diagnosis not present

## 2022-04-19 DIAGNOSIS — N186 End stage renal disease: Secondary | ICD-10-CM | POA: Diagnosis not present

## 2022-04-19 DIAGNOSIS — R82998 Other abnormal findings in urine: Secondary | ICD-10-CM | POA: Diagnosis not present

## 2022-04-19 DIAGNOSIS — Z992 Dependence on renal dialysis: Secondary | ICD-10-CM | POA: Diagnosis not present

## 2022-04-19 DIAGNOSIS — Z79899 Other long term (current) drug therapy: Secondary | ICD-10-CM | POA: Diagnosis not present

## 2022-04-19 DIAGNOSIS — D509 Iron deficiency anemia, unspecified: Secondary | ICD-10-CM | POA: Diagnosis not present

## 2022-04-20 DIAGNOSIS — N2581 Secondary hyperparathyroidism of renal origin: Secondary | ICD-10-CM | POA: Diagnosis not present

## 2022-04-20 DIAGNOSIS — D509 Iron deficiency anemia, unspecified: Secondary | ICD-10-CM | POA: Diagnosis not present

## 2022-04-20 DIAGNOSIS — Z992 Dependence on renal dialysis: Secondary | ICD-10-CM | POA: Diagnosis not present

## 2022-04-20 DIAGNOSIS — Z79899 Other long term (current) drug therapy: Secondary | ICD-10-CM | POA: Diagnosis not present

## 2022-04-20 DIAGNOSIS — E875 Hyperkalemia: Secondary | ICD-10-CM | POA: Diagnosis not present

## 2022-04-20 DIAGNOSIS — D631 Anemia in chronic kidney disease: Secondary | ICD-10-CM | POA: Diagnosis not present

## 2022-04-20 DIAGNOSIS — Z23 Encounter for immunization: Secondary | ICD-10-CM | POA: Diagnosis not present

## 2022-04-20 DIAGNOSIS — R82998 Other abnormal findings in urine: Secondary | ICD-10-CM | POA: Diagnosis not present

## 2022-04-20 DIAGNOSIS — N186 End stage renal disease: Secondary | ICD-10-CM | POA: Diagnosis not present

## 2022-04-21 DIAGNOSIS — D631 Anemia in chronic kidney disease: Secondary | ICD-10-CM | POA: Diagnosis not present

## 2022-04-21 DIAGNOSIS — N2581 Secondary hyperparathyroidism of renal origin: Secondary | ICD-10-CM | POA: Diagnosis not present

## 2022-04-21 DIAGNOSIS — R82998 Other abnormal findings in urine: Secondary | ICD-10-CM | POA: Diagnosis not present

## 2022-04-21 DIAGNOSIS — N186 End stage renal disease: Secondary | ICD-10-CM | POA: Diagnosis not present

## 2022-04-21 DIAGNOSIS — Z992 Dependence on renal dialysis: Secondary | ICD-10-CM | POA: Diagnosis not present

## 2022-04-21 DIAGNOSIS — Z23 Encounter for immunization: Secondary | ICD-10-CM | POA: Diagnosis not present

## 2022-04-21 DIAGNOSIS — D509 Iron deficiency anemia, unspecified: Secondary | ICD-10-CM | POA: Diagnosis not present

## 2022-04-21 DIAGNOSIS — E875 Hyperkalemia: Secondary | ICD-10-CM | POA: Diagnosis not present

## 2022-04-21 DIAGNOSIS — Z79899 Other long term (current) drug therapy: Secondary | ICD-10-CM | POA: Diagnosis not present

## 2022-04-22 DIAGNOSIS — N186 End stage renal disease: Secondary | ICD-10-CM | POA: Diagnosis not present

## 2022-04-22 DIAGNOSIS — D509 Iron deficiency anemia, unspecified: Secondary | ICD-10-CM | POA: Diagnosis not present

## 2022-04-22 DIAGNOSIS — Z23 Encounter for immunization: Secondary | ICD-10-CM | POA: Diagnosis not present

## 2022-04-22 DIAGNOSIS — Z79899 Other long term (current) drug therapy: Secondary | ICD-10-CM | POA: Diagnosis not present

## 2022-04-22 DIAGNOSIS — R82998 Other abnormal findings in urine: Secondary | ICD-10-CM | POA: Diagnosis not present

## 2022-04-22 DIAGNOSIS — E875 Hyperkalemia: Secondary | ICD-10-CM | POA: Diagnosis not present

## 2022-04-22 DIAGNOSIS — Z992 Dependence on renal dialysis: Secondary | ICD-10-CM | POA: Diagnosis not present

## 2022-04-22 DIAGNOSIS — N2581 Secondary hyperparathyroidism of renal origin: Secondary | ICD-10-CM | POA: Diagnosis not present

## 2022-04-22 DIAGNOSIS — D631 Anemia in chronic kidney disease: Secondary | ICD-10-CM | POA: Diagnosis not present

## 2022-04-23 DIAGNOSIS — Z992 Dependence on renal dialysis: Secondary | ICD-10-CM | POA: Diagnosis not present

## 2022-04-23 DIAGNOSIS — N2581 Secondary hyperparathyroidism of renal origin: Secondary | ICD-10-CM | POA: Diagnosis not present

## 2022-04-23 DIAGNOSIS — E875 Hyperkalemia: Secondary | ICD-10-CM | POA: Diagnosis not present

## 2022-04-23 DIAGNOSIS — Z79899 Other long term (current) drug therapy: Secondary | ICD-10-CM | POA: Diagnosis not present

## 2022-04-23 DIAGNOSIS — R82998 Other abnormal findings in urine: Secondary | ICD-10-CM | POA: Diagnosis not present

## 2022-04-23 DIAGNOSIS — N186 End stage renal disease: Secondary | ICD-10-CM | POA: Diagnosis not present

## 2022-04-23 DIAGNOSIS — D509 Iron deficiency anemia, unspecified: Secondary | ICD-10-CM | POA: Diagnosis not present

## 2022-04-23 DIAGNOSIS — D631 Anemia in chronic kidney disease: Secondary | ICD-10-CM | POA: Diagnosis not present

## 2022-04-23 DIAGNOSIS — Z23 Encounter for immunization: Secondary | ICD-10-CM | POA: Diagnosis not present

## 2022-04-24 DIAGNOSIS — D509 Iron deficiency anemia, unspecified: Secondary | ICD-10-CM | POA: Diagnosis not present

## 2022-04-24 DIAGNOSIS — D631 Anemia in chronic kidney disease: Secondary | ICD-10-CM | POA: Diagnosis not present

## 2022-04-24 DIAGNOSIS — E875 Hyperkalemia: Secondary | ICD-10-CM | POA: Diagnosis not present

## 2022-04-24 DIAGNOSIS — N2581 Secondary hyperparathyroidism of renal origin: Secondary | ICD-10-CM | POA: Diagnosis not present

## 2022-04-24 DIAGNOSIS — Z992 Dependence on renal dialysis: Secondary | ICD-10-CM | POA: Diagnosis not present

## 2022-04-24 DIAGNOSIS — Z23 Encounter for immunization: Secondary | ICD-10-CM | POA: Diagnosis not present

## 2022-04-24 DIAGNOSIS — R82998 Other abnormal findings in urine: Secondary | ICD-10-CM | POA: Diagnosis not present

## 2022-04-24 DIAGNOSIS — Z79899 Other long term (current) drug therapy: Secondary | ICD-10-CM | POA: Diagnosis not present

## 2022-04-24 DIAGNOSIS — N186 End stage renal disease: Secondary | ICD-10-CM | POA: Diagnosis not present

## 2022-04-25 DIAGNOSIS — Z79899 Other long term (current) drug therapy: Secondary | ICD-10-CM | POA: Diagnosis not present

## 2022-04-25 DIAGNOSIS — N186 End stage renal disease: Secondary | ICD-10-CM | POA: Diagnosis not present

## 2022-04-25 DIAGNOSIS — Z992 Dependence on renal dialysis: Secondary | ICD-10-CM | POA: Diagnosis not present

## 2022-04-25 DIAGNOSIS — D509 Iron deficiency anemia, unspecified: Secondary | ICD-10-CM | POA: Diagnosis not present

## 2022-04-25 DIAGNOSIS — E875 Hyperkalemia: Secondary | ICD-10-CM | POA: Diagnosis not present

## 2022-04-25 DIAGNOSIS — Z23 Encounter for immunization: Secondary | ICD-10-CM | POA: Diagnosis not present

## 2022-04-25 DIAGNOSIS — D631 Anemia in chronic kidney disease: Secondary | ICD-10-CM | POA: Diagnosis not present

## 2022-04-25 DIAGNOSIS — N2581 Secondary hyperparathyroidism of renal origin: Secondary | ICD-10-CM | POA: Diagnosis not present

## 2022-04-25 DIAGNOSIS — R82998 Other abnormal findings in urine: Secondary | ICD-10-CM | POA: Diagnosis not present

## 2022-04-26 DIAGNOSIS — R82998 Other abnormal findings in urine: Secondary | ICD-10-CM | POA: Diagnosis not present

## 2022-04-26 DIAGNOSIS — Z79899 Other long term (current) drug therapy: Secondary | ICD-10-CM | POA: Diagnosis not present

## 2022-04-26 DIAGNOSIS — Z23 Encounter for immunization: Secondary | ICD-10-CM | POA: Diagnosis not present

## 2022-04-26 DIAGNOSIS — E875 Hyperkalemia: Secondary | ICD-10-CM | POA: Diagnosis not present

## 2022-04-26 DIAGNOSIS — N186 End stage renal disease: Secondary | ICD-10-CM | POA: Diagnosis not present

## 2022-04-26 DIAGNOSIS — Z992 Dependence on renal dialysis: Secondary | ICD-10-CM | POA: Diagnosis not present

## 2022-04-26 DIAGNOSIS — N2581 Secondary hyperparathyroidism of renal origin: Secondary | ICD-10-CM | POA: Diagnosis not present

## 2022-04-26 DIAGNOSIS — D631 Anemia in chronic kidney disease: Secondary | ICD-10-CM | POA: Diagnosis not present

## 2022-04-26 DIAGNOSIS — D509 Iron deficiency anemia, unspecified: Secondary | ICD-10-CM | POA: Diagnosis not present

## 2022-04-27 DIAGNOSIS — D509 Iron deficiency anemia, unspecified: Secondary | ICD-10-CM | POA: Diagnosis not present

## 2022-04-27 DIAGNOSIS — E875 Hyperkalemia: Secondary | ICD-10-CM | POA: Diagnosis not present

## 2022-04-27 DIAGNOSIS — D631 Anemia in chronic kidney disease: Secondary | ICD-10-CM | POA: Diagnosis not present

## 2022-04-27 DIAGNOSIS — Z992 Dependence on renal dialysis: Secondary | ICD-10-CM | POA: Diagnosis not present

## 2022-04-27 DIAGNOSIS — N2581 Secondary hyperparathyroidism of renal origin: Secondary | ICD-10-CM | POA: Diagnosis not present

## 2022-04-27 DIAGNOSIS — Z23 Encounter for immunization: Secondary | ICD-10-CM | POA: Diagnosis not present

## 2022-04-27 DIAGNOSIS — Z79899 Other long term (current) drug therapy: Secondary | ICD-10-CM | POA: Diagnosis not present

## 2022-04-27 DIAGNOSIS — N186 End stage renal disease: Secondary | ICD-10-CM | POA: Diagnosis not present

## 2022-04-27 DIAGNOSIS — R82998 Other abnormal findings in urine: Secondary | ICD-10-CM | POA: Diagnosis not present

## 2022-04-28 DIAGNOSIS — N186 End stage renal disease: Secondary | ICD-10-CM | POA: Diagnosis not present

## 2022-04-28 DIAGNOSIS — Z79899 Other long term (current) drug therapy: Secondary | ICD-10-CM | POA: Diagnosis not present

## 2022-04-28 DIAGNOSIS — D631 Anemia in chronic kidney disease: Secondary | ICD-10-CM | POA: Diagnosis not present

## 2022-04-28 DIAGNOSIS — Z992 Dependence on renal dialysis: Secondary | ICD-10-CM | POA: Diagnosis not present

## 2022-04-28 DIAGNOSIS — R82998 Other abnormal findings in urine: Secondary | ICD-10-CM | POA: Diagnosis not present

## 2022-04-28 DIAGNOSIS — Z23 Encounter for immunization: Secondary | ICD-10-CM | POA: Diagnosis not present

## 2022-04-28 DIAGNOSIS — E875 Hyperkalemia: Secondary | ICD-10-CM | POA: Diagnosis not present

## 2022-04-28 DIAGNOSIS — D509 Iron deficiency anemia, unspecified: Secondary | ICD-10-CM | POA: Diagnosis not present

## 2022-04-28 DIAGNOSIS — N2581 Secondary hyperparathyroidism of renal origin: Secondary | ICD-10-CM | POA: Diagnosis not present

## 2022-04-29 DIAGNOSIS — Z992 Dependence on renal dialysis: Secondary | ICD-10-CM | POA: Diagnosis not present

## 2022-04-29 DIAGNOSIS — N186 End stage renal disease: Secondary | ICD-10-CM | POA: Diagnosis not present

## 2022-04-29 DIAGNOSIS — Z79899 Other long term (current) drug therapy: Secondary | ICD-10-CM | POA: Diagnosis not present

## 2022-04-29 DIAGNOSIS — D509 Iron deficiency anemia, unspecified: Secondary | ICD-10-CM | POA: Diagnosis not present

## 2022-04-29 DIAGNOSIS — E875 Hyperkalemia: Secondary | ICD-10-CM | POA: Diagnosis not present

## 2022-04-29 DIAGNOSIS — Z23 Encounter for immunization: Secondary | ICD-10-CM | POA: Diagnosis not present

## 2022-04-29 DIAGNOSIS — N2581 Secondary hyperparathyroidism of renal origin: Secondary | ICD-10-CM | POA: Diagnosis not present

## 2022-04-29 DIAGNOSIS — R82998 Other abnormal findings in urine: Secondary | ICD-10-CM | POA: Diagnosis not present

## 2022-04-29 DIAGNOSIS — D631 Anemia in chronic kidney disease: Secondary | ICD-10-CM | POA: Diagnosis not present

## 2022-04-30 DIAGNOSIS — D631 Anemia in chronic kidney disease: Secondary | ICD-10-CM | POA: Diagnosis not present

## 2022-04-30 DIAGNOSIS — Z992 Dependence on renal dialysis: Secondary | ICD-10-CM | POA: Diagnosis not present

## 2022-04-30 DIAGNOSIS — E875 Hyperkalemia: Secondary | ICD-10-CM | POA: Diagnosis not present

## 2022-04-30 DIAGNOSIS — R82998 Other abnormal findings in urine: Secondary | ICD-10-CM | POA: Diagnosis not present

## 2022-04-30 DIAGNOSIS — N186 End stage renal disease: Secondary | ICD-10-CM | POA: Diagnosis not present

## 2022-04-30 DIAGNOSIS — Z23 Encounter for immunization: Secondary | ICD-10-CM | POA: Diagnosis not present

## 2022-04-30 DIAGNOSIS — N2581 Secondary hyperparathyroidism of renal origin: Secondary | ICD-10-CM | POA: Diagnosis not present

## 2022-04-30 DIAGNOSIS — D509 Iron deficiency anemia, unspecified: Secondary | ICD-10-CM | POA: Diagnosis not present

## 2022-04-30 DIAGNOSIS — Z79899 Other long term (current) drug therapy: Secondary | ICD-10-CM | POA: Diagnosis not present

## 2022-05-01 DIAGNOSIS — Z992 Dependence on renal dialysis: Secondary | ICD-10-CM | POA: Diagnosis not present

## 2022-05-01 DIAGNOSIS — E875 Hyperkalemia: Secondary | ICD-10-CM | POA: Diagnosis not present

## 2022-05-01 DIAGNOSIS — D509 Iron deficiency anemia, unspecified: Secondary | ICD-10-CM | POA: Diagnosis not present

## 2022-05-01 DIAGNOSIS — Z79899 Other long term (current) drug therapy: Secondary | ICD-10-CM | POA: Diagnosis not present

## 2022-05-01 DIAGNOSIS — N2581 Secondary hyperparathyroidism of renal origin: Secondary | ICD-10-CM | POA: Diagnosis not present

## 2022-05-01 DIAGNOSIS — Z23 Encounter for immunization: Secondary | ICD-10-CM | POA: Diagnosis not present

## 2022-05-01 DIAGNOSIS — D631 Anemia in chronic kidney disease: Secondary | ICD-10-CM | POA: Diagnosis not present

## 2022-05-01 DIAGNOSIS — N186 End stage renal disease: Secondary | ICD-10-CM | POA: Diagnosis not present

## 2022-05-01 DIAGNOSIS — R82998 Other abnormal findings in urine: Secondary | ICD-10-CM | POA: Diagnosis not present

## 2022-05-02 DIAGNOSIS — N186 End stage renal disease: Secondary | ICD-10-CM | POA: Diagnosis not present

## 2022-05-02 DIAGNOSIS — Z992 Dependence on renal dialysis: Secondary | ICD-10-CM | POA: Diagnosis not present

## 2022-05-02 DIAGNOSIS — N2581 Secondary hyperparathyroidism of renal origin: Secondary | ICD-10-CM | POA: Diagnosis not present

## 2022-05-03 DIAGNOSIS — Z992 Dependence on renal dialysis: Secondary | ICD-10-CM | POA: Diagnosis not present

## 2022-05-03 DIAGNOSIS — N2581 Secondary hyperparathyroidism of renal origin: Secondary | ICD-10-CM | POA: Diagnosis not present

## 2022-05-03 DIAGNOSIS — N186 End stage renal disease: Secondary | ICD-10-CM | POA: Diagnosis not present

## 2022-05-04 DIAGNOSIS — N186 End stage renal disease: Secondary | ICD-10-CM | POA: Diagnosis not present

## 2022-05-04 DIAGNOSIS — Z992 Dependence on renal dialysis: Secondary | ICD-10-CM | POA: Diagnosis not present

## 2022-05-04 DIAGNOSIS — N2581 Secondary hyperparathyroidism of renal origin: Secondary | ICD-10-CM | POA: Diagnosis not present

## 2022-05-05 DIAGNOSIS — N2581 Secondary hyperparathyroidism of renal origin: Secondary | ICD-10-CM | POA: Diagnosis not present

## 2022-05-05 DIAGNOSIS — N186 End stage renal disease: Secondary | ICD-10-CM | POA: Diagnosis not present

## 2022-05-05 DIAGNOSIS — Z992 Dependence on renal dialysis: Secondary | ICD-10-CM | POA: Diagnosis not present

## 2022-05-06 DIAGNOSIS — N2581 Secondary hyperparathyroidism of renal origin: Secondary | ICD-10-CM | POA: Diagnosis not present

## 2022-05-06 DIAGNOSIS — N186 End stage renal disease: Secondary | ICD-10-CM | POA: Diagnosis not present

## 2022-05-06 DIAGNOSIS — Z992 Dependence on renal dialysis: Secondary | ICD-10-CM | POA: Diagnosis not present

## 2022-05-07 DIAGNOSIS — N186 End stage renal disease: Secondary | ICD-10-CM | POA: Diagnosis not present

## 2022-05-07 DIAGNOSIS — N2581 Secondary hyperparathyroidism of renal origin: Secondary | ICD-10-CM | POA: Diagnosis not present

## 2022-05-07 DIAGNOSIS — Z992 Dependence on renal dialysis: Secondary | ICD-10-CM | POA: Diagnosis not present

## 2022-05-08 DIAGNOSIS — Z992 Dependence on renal dialysis: Secondary | ICD-10-CM | POA: Diagnosis not present

## 2022-05-08 DIAGNOSIS — N186 End stage renal disease: Secondary | ICD-10-CM | POA: Diagnosis not present

## 2022-05-08 DIAGNOSIS — N2581 Secondary hyperparathyroidism of renal origin: Secondary | ICD-10-CM | POA: Diagnosis not present

## 2022-05-09 DIAGNOSIS — N186 End stage renal disease: Secondary | ICD-10-CM | POA: Diagnosis not present

## 2022-05-09 DIAGNOSIS — N2581 Secondary hyperparathyroidism of renal origin: Secondary | ICD-10-CM | POA: Diagnosis not present

## 2022-05-09 DIAGNOSIS — Z992 Dependence on renal dialysis: Secondary | ICD-10-CM | POA: Diagnosis not present

## 2022-05-10 DIAGNOSIS — Z992 Dependence on renal dialysis: Secondary | ICD-10-CM | POA: Diagnosis not present

## 2022-05-10 DIAGNOSIS — N186 End stage renal disease: Secondary | ICD-10-CM | POA: Diagnosis not present

## 2022-05-10 DIAGNOSIS — N2581 Secondary hyperparathyroidism of renal origin: Secondary | ICD-10-CM | POA: Diagnosis not present

## 2022-05-11 DIAGNOSIS — Z992 Dependence on renal dialysis: Secondary | ICD-10-CM | POA: Diagnosis not present

## 2022-05-11 DIAGNOSIS — N2581 Secondary hyperparathyroidism of renal origin: Secondary | ICD-10-CM | POA: Diagnosis not present

## 2022-05-11 DIAGNOSIS — N186 End stage renal disease: Secondary | ICD-10-CM | POA: Diagnosis not present

## 2022-05-12 DIAGNOSIS — N186 End stage renal disease: Secondary | ICD-10-CM | POA: Diagnosis not present

## 2022-05-12 DIAGNOSIS — Z992 Dependence on renal dialysis: Secondary | ICD-10-CM | POA: Diagnosis not present

## 2022-05-12 DIAGNOSIS — N2581 Secondary hyperparathyroidism of renal origin: Secondary | ICD-10-CM | POA: Diagnosis not present

## 2022-05-13 DIAGNOSIS — Z992 Dependence on renal dialysis: Secondary | ICD-10-CM | POA: Diagnosis not present

## 2022-05-13 DIAGNOSIS — G4733 Obstructive sleep apnea (adult) (pediatric): Secondary | ICD-10-CM | POA: Diagnosis not present

## 2022-05-13 DIAGNOSIS — N186 End stage renal disease: Secondary | ICD-10-CM | POA: Diagnosis not present

## 2022-05-13 DIAGNOSIS — N2581 Secondary hyperparathyroidism of renal origin: Secondary | ICD-10-CM | POA: Diagnosis not present

## 2022-05-14 DIAGNOSIS — N186 End stage renal disease: Secondary | ICD-10-CM | POA: Diagnosis not present

## 2022-05-14 DIAGNOSIS — N2581 Secondary hyperparathyroidism of renal origin: Secondary | ICD-10-CM | POA: Diagnosis not present

## 2022-05-14 DIAGNOSIS — Z992 Dependence on renal dialysis: Secondary | ICD-10-CM | POA: Diagnosis not present

## 2022-05-15 DIAGNOSIS — N2581 Secondary hyperparathyroidism of renal origin: Secondary | ICD-10-CM | POA: Diagnosis not present

## 2022-05-15 DIAGNOSIS — Z992 Dependence on renal dialysis: Secondary | ICD-10-CM | POA: Diagnosis not present

## 2022-05-15 DIAGNOSIS — N186 End stage renal disease: Secondary | ICD-10-CM | POA: Diagnosis not present

## 2022-05-16 DIAGNOSIS — Z992 Dependence on renal dialysis: Secondary | ICD-10-CM | POA: Diagnosis not present

## 2022-05-16 DIAGNOSIS — N2581 Secondary hyperparathyroidism of renal origin: Secondary | ICD-10-CM | POA: Diagnosis not present

## 2022-05-16 DIAGNOSIS — N186 End stage renal disease: Secondary | ICD-10-CM | POA: Diagnosis not present

## 2022-05-17 DIAGNOSIS — Z992 Dependence on renal dialysis: Secondary | ICD-10-CM | POA: Diagnosis not present

## 2022-05-17 DIAGNOSIS — N2581 Secondary hyperparathyroidism of renal origin: Secondary | ICD-10-CM | POA: Diagnosis not present

## 2022-05-17 DIAGNOSIS — N186 End stage renal disease: Secondary | ICD-10-CM | POA: Diagnosis not present

## 2022-05-18 DIAGNOSIS — N2581 Secondary hyperparathyroidism of renal origin: Secondary | ICD-10-CM | POA: Diagnosis not present

## 2022-05-18 DIAGNOSIS — N186 End stage renal disease: Secondary | ICD-10-CM | POA: Diagnosis not present

## 2022-05-18 DIAGNOSIS — Z992 Dependence on renal dialysis: Secondary | ICD-10-CM | POA: Diagnosis not present

## 2022-05-19 DIAGNOSIS — N2581 Secondary hyperparathyroidism of renal origin: Secondary | ICD-10-CM | POA: Diagnosis not present

## 2022-05-19 DIAGNOSIS — Z992 Dependence on renal dialysis: Secondary | ICD-10-CM | POA: Diagnosis not present

## 2022-05-19 DIAGNOSIS — N186 End stage renal disease: Secondary | ICD-10-CM | POA: Diagnosis not present

## 2022-05-20 DIAGNOSIS — N2581 Secondary hyperparathyroidism of renal origin: Secondary | ICD-10-CM | POA: Diagnosis not present

## 2022-05-20 DIAGNOSIS — N186 End stage renal disease: Secondary | ICD-10-CM | POA: Diagnosis not present

## 2022-05-20 DIAGNOSIS — Z992 Dependence on renal dialysis: Secondary | ICD-10-CM | POA: Diagnosis not present

## 2022-05-21 DIAGNOSIS — N186 End stage renal disease: Secondary | ICD-10-CM | POA: Diagnosis not present

## 2022-05-21 DIAGNOSIS — N2581 Secondary hyperparathyroidism of renal origin: Secondary | ICD-10-CM | POA: Diagnosis not present

## 2022-05-21 DIAGNOSIS — Z992 Dependence on renal dialysis: Secondary | ICD-10-CM | POA: Diagnosis not present

## 2022-05-22 DIAGNOSIS — N2581 Secondary hyperparathyroidism of renal origin: Secondary | ICD-10-CM | POA: Diagnosis not present

## 2022-05-22 DIAGNOSIS — Z992 Dependence on renal dialysis: Secondary | ICD-10-CM | POA: Diagnosis not present

## 2022-05-22 DIAGNOSIS — N186 End stage renal disease: Secondary | ICD-10-CM | POA: Diagnosis not present

## 2022-05-23 DIAGNOSIS — Z992 Dependence on renal dialysis: Secondary | ICD-10-CM | POA: Diagnosis not present

## 2022-05-23 DIAGNOSIS — N2581 Secondary hyperparathyroidism of renal origin: Secondary | ICD-10-CM | POA: Diagnosis not present

## 2022-05-23 DIAGNOSIS — N186 End stage renal disease: Secondary | ICD-10-CM | POA: Diagnosis not present

## 2022-05-24 DIAGNOSIS — Z992 Dependence on renal dialysis: Secondary | ICD-10-CM | POA: Diagnosis not present

## 2022-05-24 DIAGNOSIS — N186 End stage renal disease: Secondary | ICD-10-CM | POA: Diagnosis not present

## 2022-05-24 DIAGNOSIS — N2581 Secondary hyperparathyroidism of renal origin: Secondary | ICD-10-CM | POA: Diagnosis not present

## 2022-05-25 DIAGNOSIS — Z992 Dependence on renal dialysis: Secondary | ICD-10-CM | POA: Diagnosis not present

## 2022-05-25 DIAGNOSIS — N186 End stage renal disease: Secondary | ICD-10-CM | POA: Diagnosis not present

## 2022-05-25 DIAGNOSIS — N2581 Secondary hyperparathyroidism of renal origin: Secondary | ICD-10-CM | POA: Diagnosis not present

## 2022-05-26 DIAGNOSIS — N186 End stage renal disease: Secondary | ICD-10-CM | POA: Diagnosis not present

## 2022-05-26 DIAGNOSIS — N2581 Secondary hyperparathyroidism of renal origin: Secondary | ICD-10-CM | POA: Diagnosis not present

## 2022-05-26 DIAGNOSIS — Z992 Dependence on renal dialysis: Secondary | ICD-10-CM | POA: Diagnosis not present

## 2022-05-27 DIAGNOSIS — Z992 Dependence on renal dialysis: Secondary | ICD-10-CM | POA: Diagnosis not present

## 2022-05-27 DIAGNOSIS — N2581 Secondary hyperparathyroidism of renal origin: Secondary | ICD-10-CM | POA: Diagnosis not present

## 2022-05-27 DIAGNOSIS — N186 End stage renal disease: Secondary | ICD-10-CM | POA: Diagnosis not present

## 2022-05-28 DIAGNOSIS — N2581 Secondary hyperparathyroidism of renal origin: Secondary | ICD-10-CM | POA: Diagnosis not present

## 2022-05-28 DIAGNOSIS — Z992 Dependence on renal dialysis: Secondary | ICD-10-CM | POA: Diagnosis not present

## 2022-05-28 DIAGNOSIS — N186 End stage renal disease: Secondary | ICD-10-CM | POA: Diagnosis not present

## 2022-05-29 DIAGNOSIS — N2581 Secondary hyperparathyroidism of renal origin: Secondary | ICD-10-CM | POA: Diagnosis not present

## 2022-05-29 DIAGNOSIS — Z992 Dependence on renal dialysis: Secondary | ICD-10-CM | POA: Diagnosis not present

## 2022-05-29 DIAGNOSIS — N186 End stage renal disease: Secondary | ICD-10-CM | POA: Diagnosis not present

## 2022-05-30 DIAGNOSIS — N186 End stage renal disease: Secondary | ICD-10-CM | POA: Diagnosis not present

## 2022-05-30 DIAGNOSIS — N2581 Secondary hyperparathyroidism of renal origin: Secondary | ICD-10-CM | POA: Diagnosis not present

## 2022-05-30 DIAGNOSIS — Z992 Dependence on renal dialysis: Secondary | ICD-10-CM | POA: Diagnosis not present

## 2022-05-31 DIAGNOSIS — N2581 Secondary hyperparathyroidism of renal origin: Secondary | ICD-10-CM | POA: Diagnosis not present

## 2022-05-31 DIAGNOSIS — Z992 Dependence on renal dialysis: Secondary | ICD-10-CM | POA: Diagnosis not present

## 2022-05-31 DIAGNOSIS — N186 End stage renal disease: Secondary | ICD-10-CM | POA: Diagnosis not present

## 2022-06-01 DIAGNOSIS — Z992 Dependence on renal dialysis: Secondary | ICD-10-CM | POA: Diagnosis not present

## 2022-06-01 DIAGNOSIS — N186 End stage renal disease: Secondary | ICD-10-CM | POA: Diagnosis not present

## 2022-06-01 DIAGNOSIS — N2581 Secondary hyperparathyroidism of renal origin: Secondary | ICD-10-CM | POA: Diagnosis not present

## 2022-06-02 DIAGNOSIS — Z992 Dependence on renal dialysis: Secondary | ICD-10-CM | POA: Diagnosis not present

## 2022-06-02 DIAGNOSIS — N186 End stage renal disease: Secondary | ICD-10-CM | POA: Diagnosis not present

## 2022-06-02 DIAGNOSIS — N2581 Secondary hyperparathyroidism of renal origin: Secondary | ICD-10-CM | POA: Diagnosis not present

## 2022-06-03 DIAGNOSIS — Z992 Dependence on renal dialysis: Secondary | ICD-10-CM | POA: Diagnosis not present

## 2022-06-03 DIAGNOSIS — N186 End stage renal disease: Secondary | ICD-10-CM | POA: Diagnosis not present

## 2022-06-03 DIAGNOSIS — N2581 Secondary hyperparathyroidism of renal origin: Secondary | ICD-10-CM | POA: Diagnosis not present

## 2022-06-04 DIAGNOSIS — N2581 Secondary hyperparathyroidism of renal origin: Secondary | ICD-10-CM | POA: Diagnosis not present

## 2022-06-04 DIAGNOSIS — Z992 Dependence on renal dialysis: Secondary | ICD-10-CM | POA: Diagnosis not present

## 2022-06-04 DIAGNOSIS — N186 End stage renal disease: Secondary | ICD-10-CM | POA: Diagnosis not present

## 2022-06-05 DIAGNOSIS — N2581 Secondary hyperparathyroidism of renal origin: Secondary | ICD-10-CM | POA: Diagnosis not present

## 2022-06-05 DIAGNOSIS — N186 End stage renal disease: Secondary | ICD-10-CM | POA: Diagnosis not present

## 2022-06-05 DIAGNOSIS — Z992 Dependence on renal dialysis: Secondary | ICD-10-CM | POA: Diagnosis not present

## 2022-06-06 DIAGNOSIS — N186 End stage renal disease: Secondary | ICD-10-CM | POA: Diagnosis not present

## 2022-06-06 DIAGNOSIS — Z992 Dependence on renal dialysis: Secondary | ICD-10-CM | POA: Diagnosis not present

## 2022-06-06 DIAGNOSIS — N2581 Secondary hyperparathyroidism of renal origin: Secondary | ICD-10-CM | POA: Diagnosis not present

## 2022-06-07 DIAGNOSIS — N2581 Secondary hyperparathyroidism of renal origin: Secondary | ICD-10-CM | POA: Diagnosis not present

## 2022-06-07 DIAGNOSIS — N186 End stage renal disease: Secondary | ICD-10-CM | POA: Diagnosis not present

## 2022-06-07 DIAGNOSIS — Z992 Dependence on renal dialysis: Secondary | ICD-10-CM | POA: Diagnosis not present

## 2022-06-08 DIAGNOSIS — N2581 Secondary hyperparathyroidism of renal origin: Secondary | ICD-10-CM | POA: Diagnosis not present

## 2022-06-08 DIAGNOSIS — N186 End stage renal disease: Secondary | ICD-10-CM | POA: Diagnosis not present

## 2022-06-08 DIAGNOSIS — Z992 Dependence on renal dialysis: Secondary | ICD-10-CM | POA: Diagnosis not present

## 2022-06-09 DIAGNOSIS — N2581 Secondary hyperparathyroidism of renal origin: Secondary | ICD-10-CM | POA: Diagnosis not present

## 2022-06-09 DIAGNOSIS — Z992 Dependence on renal dialysis: Secondary | ICD-10-CM | POA: Diagnosis not present

## 2022-06-09 DIAGNOSIS — N186 End stage renal disease: Secondary | ICD-10-CM | POA: Diagnosis not present

## 2022-06-10 DIAGNOSIS — Z992 Dependence on renal dialysis: Secondary | ICD-10-CM | POA: Diagnosis not present

## 2022-06-10 DIAGNOSIS — N2581 Secondary hyperparathyroidism of renal origin: Secondary | ICD-10-CM | POA: Diagnosis not present

## 2022-06-10 DIAGNOSIS — N186 End stage renal disease: Secondary | ICD-10-CM | POA: Diagnosis not present

## 2022-06-11 DIAGNOSIS — N2581 Secondary hyperparathyroidism of renal origin: Secondary | ICD-10-CM | POA: Diagnosis not present

## 2022-06-11 DIAGNOSIS — N186 End stage renal disease: Secondary | ICD-10-CM | POA: Diagnosis not present

## 2022-06-11 DIAGNOSIS — Z992 Dependence on renal dialysis: Secondary | ICD-10-CM | POA: Diagnosis not present

## 2022-06-12 DIAGNOSIS — N2581 Secondary hyperparathyroidism of renal origin: Secondary | ICD-10-CM | POA: Diagnosis not present

## 2022-06-12 DIAGNOSIS — G4733 Obstructive sleep apnea (adult) (pediatric): Secondary | ICD-10-CM | POA: Diagnosis not present

## 2022-06-12 DIAGNOSIS — Z992 Dependence on renal dialysis: Secondary | ICD-10-CM | POA: Diagnosis not present

## 2022-06-12 DIAGNOSIS — N186 End stage renal disease: Secondary | ICD-10-CM | POA: Diagnosis not present

## 2022-06-13 DIAGNOSIS — N186 End stage renal disease: Secondary | ICD-10-CM | POA: Diagnosis not present

## 2022-06-13 DIAGNOSIS — Z992 Dependence on renal dialysis: Secondary | ICD-10-CM | POA: Diagnosis not present

## 2022-06-13 DIAGNOSIS — N2581 Secondary hyperparathyroidism of renal origin: Secondary | ICD-10-CM | POA: Diagnosis not present

## 2022-06-14 DIAGNOSIS — N2581 Secondary hyperparathyroidism of renal origin: Secondary | ICD-10-CM | POA: Diagnosis not present

## 2022-06-14 DIAGNOSIS — Z992 Dependence on renal dialysis: Secondary | ICD-10-CM | POA: Diagnosis not present

## 2022-06-14 DIAGNOSIS — N186 End stage renal disease: Secondary | ICD-10-CM | POA: Diagnosis not present

## 2022-06-15 DIAGNOSIS — Z992 Dependence on renal dialysis: Secondary | ICD-10-CM | POA: Diagnosis not present

## 2022-06-15 DIAGNOSIS — N186 End stage renal disease: Secondary | ICD-10-CM | POA: Diagnosis not present

## 2022-06-15 DIAGNOSIS — N2581 Secondary hyperparathyroidism of renal origin: Secondary | ICD-10-CM | POA: Diagnosis not present

## 2022-06-16 DIAGNOSIS — N186 End stage renal disease: Secondary | ICD-10-CM | POA: Diagnosis not present

## 2022-06-16 DIAGNOSIS — Z992 Dependence on renal dialysis: Secondary | ICD-10-CM | POA: Diagnosis not present

## 2022-06-16 DIAGNOSIS — N2581 Secondary hyperparathyroidism of renal origin: Secondary | ICD-10-CM | POA: Diagnosis not present

## 2022-06-17 DIAGNOSIS — N186 End stage renal disease: Secondary | ICD-10-CM | POA: Diagnosis not present

## 2022-06-17 DIAGNOSIS — Z992 Dependence on renal dialysis: Secondary | ICD-10-CM | POA: Diagnosis not present

## 2022-06-17 DIAGNOSIS — N2581 Secondary hyperparathyroidism of renal origin: Secondary | ICD-10-CM | POA: Diagnosis not present

## 2022-06-18 DIAGNOSIS — N186 End stage renal disease: Secondary | ICD-10-CM | POA: Diagnosis not present

## 2022-06-18 DIAGNOSIS — N2581 Secondary hyperparathyroidism of renal origin: Secondary | ICD-10-CM | POA: Diagnosis not present

## 2022-06-18 DIAGNOSIS — Z992 Dependence on renal dialysis: Secondary | ICD-10-CM | POA: Diagnosis not present

## 2022-06-19 DIAGNOSIS — Z992 Dependence on renal dialysis: Secondary | ICD-10-CM | POA: Diagnosis not present

## 2022-06-19 DIAGNOSIS — N2581 Secondary hyperparathyroidism of renal origin: Secondary | ICD-10-CM | POA: Diagnosis not present

## 2022-06-19 DIAGNOSIS — N186 End stage renal disease: Secondary | ICD-10-CM | POA: Diagnosis not present

## 2022-06-20 DIAGNOSIS — Z992 Dependence on renal dialysis: Secondary | ICD-10-CM | POA: Diagnosis not present

## 2022-06-20 DIAGNOSIS — N186 End stage renal disease: Secondary | ICD-10-CM | POA: Diagnosis not present

## 2022-06-20 DIAGNOSIS — N2581 Secondary hyperparathyroidism of renal origin: Secondary | ICD-10-CM | POA: Diagnosis not present

## 2022-06-21 DIAGNOSIS — Z992 Dependence on renal dialysis: Secondary | ICD-10-CM | POA: Diagnosis not present

## 2022-06-21 DIAGNOSIS — N2581 Secondary hyperparathyroidism of renal origin: Secondary | ICD-10-CM | POA: Diagnosis not present

## 2022-06-21 DIAGNOSIS — N186 End stage renal disease: Secondary | ICD-10-CM | POA: Diagnosis not present

## 2022-06-21 DIAGNOSIS — K769 Liver disease, unspecified: Secondary | ICD-10-CM | POA: Diagnosis not present

## 2022-06-21 DIAGNOSIS — D631 Anemia in chronic kidney disease: Secondary | ICD-10-CM | POA: Diagnosis not present

## 2022-06-22 DIAGNOSIS — N186 End stage renal disease: Secondary | ICD-10-CM | POA: Diagnosis not present

## 2022-06-22 DIAGNOSIS — Z992 Dependence on renal dialysis: Secondary | ICD-10-CM | POA: Diagnosis not present

## 2022-06-22 DIAGNOSIS — N2581 Secondary hyperparathyroidism of renal origin: Secondary | ICD-10-CM | POA: Diagnosis not present

## 2022-06-23 DIAGNOSIS — N186 End stage renal disease: Secondary | ICD-10-CM | POA: Diagnosis not present

## 2022-06-23 DIAGNOSIS — N2581 Secondary hyperparathyroidism of renal origin: Secondary | ICD-10-CM | POA: Diagnosis not present

## 2022-06-23 DIAGNOSIS — Z992 Dependence on renal dialysis: Secondary | ICD-10-CM | POA: Diagnosis not present

## 2022-06-24 DIAGNOSIS — N186 End stage renal disease: Secondary | ICD-10-CM | POA: Diagnosis not present

## 2022-06-24 DIAGNOSIS — N2581 Secondary hyperparathyroidism of renal origin: Secondary | ICD-10-CM | POA: Diagnosis not present

## 2022-06-24 DIAGNOSIS — Z992 Dependence on renal dialysis: Secondary | ICD-10-CM | POA: Diagnosis not present

## 2022-06-25 DIAGNOSIS — N2581 Secondary hyperparathyroidism of renal origin: Secondary | ICD-10-CM | POA: Diagnosis not present

## 2022-06-25 DIAGNOSIS — Z992 Dependence on renal dialysis: Secondary | ICD-10-CM | POA: Diagnosis not present

## 2022-06-25 DIAGNOSIS — N186 End stage renal disease: Secondary | ICD-10-CM | POA: Diagnosis not present

## 2022-06-26 DIAGNOSIS — Z992 Dependence on renal dialysis: Secondary | ICD-10-CM | POA: Diagnosis not present

## 2022-06-26 DIAGNOSIS — N186 End stage renal disease: Secondary | ICD-10-CM | POA: Diagnosis not present

## 2022-06-26 DIAGNOSIS — N2581 Secondary hyperparathyroidism of renal origin: Secondary | ICD-10-CM | POA: Diagnosis not present

## 2022-06-27 DIAGNOSIS — N2581 Secondary hyperparathyroidism of renal origin: Secondary | ICD-10-CM | POA: Diagnosis not present

## 2022-06-27 DIAGNOSIS — Z992 Dependence on renal dialysis: Secondary | ICD-10-CM | POA: Diagnosis not present

## 2022-06-27 DIAGNOSIS — N186 End stage renal disease: Secondary | ICD-10-CM | POA: Diagnosis not present

## 2022-06-28 DIAGNOSIS — Z992 Dependence on renal dialysis: Secondary | ICD-10-CM | POA: Diagnosis not present

## 2022-06-28 DIAGNOSIS — N186 End stage renal disease: Secondary | ICD-10-CM | POA: Diagnosis not present

## 2022-06-28 DIAGNOSIS — N2581 Secondary hyperparathyroidism of renal origin: Secondary | ICD-10-CM | POA: Diagnosis not present

## 2022-06-29 DIAGNOSIS — N2581 Secondary hyperparathyroidism of renal origin: Secondary | ICD-10-CM | POA: Diagnosis not present

## 2022-06-29 DIAGNOSIS — N186 End stage renal disease: Secondary | ICD-10-CM | POA: Diagnosis not present

## 2022-06-29 DIAGNOSIS — Z992 Dependence on renal dialysis: Secondary | ICD-10-CM | POA: Diagnosis not present

## 2022-06-30 DIAGNOSIS — Z992 Dependence on renal dialysis: Secondary | ICD-10-CM | POA: Diagnosis not present

## 2022-06-30 DIAGNOSIS — N2581 Secondary hyperparathyroidism of renal origin: Secondary | ICD-10-CM | POA: Diagnosis not present

## 2022-06-30 DIAGNOSIS — N186 End stage renal disease: Secondary | ICD-10-CM | POA: Diagnosis not present

## 2022-07-01 DIAGNOSIS — Z992 Dependence on renal dialysis: Secondary | ICD-10-CM | POA: Diagnosis not present

## 2022-07-01 DIAGNOSIS — N2581 Secondary hyperparathyroidism of renal origin: Secondary | ICD-10-CM | POA: Diagnosis not present

## 2022-07-01 DIAGNOSIS — N186 End stage renal disease: Secondary | ICD-10-CM | POA: Diagnosis not present

## 2022-07-02 DIAGNOSIS — E875 Hyperkalemia: Secondary | ICD-10-CM | POA: Diagnosis not present

## 2022-07-02 DIAGNOSIS — N2581 Secondary hyperparathyroidism of renal origin: Secondary | ICD-10-CM | POA: Diagnosis not present

## 2022-07-02 DIAGNOSIS — K769 Liver disease, unspecified: Secondary | ICD-10-CM | POA: Diagnosis not present

## 2022-07-02 DIAGNOSIS — D631 Anemia in chronic kidney disease: Secondary | ICD-10-CM | POA: Diagnosis not present

## 2022-07-02 DIAGNOSIS — N186 End stage renal disease: Secondary | ICD-10-CM | POA: Diagnosis not present

## 2022-07-02 DIAGNOSIS — R82998 Other abnormal findings in urine: Secondary | ICD-10-CM | POA: Diagnosis not present

## 2022-07-02 DIAGNOSIS — Z992 Dependence on renal dialysis: Secondary | ICD-10-CM | POA: Diagnosis not present

## 2022-07-02 DIAGNOSIS — D509 Iron deficiency anemia, unspecified: Secondary | ICD-10-CM | POA: Diagnosis not present

## 2022-07-02 DIAGNOSIS — Z79899 Other long term (current) drug therapy: Secondary | ICD-10-CM | POA: Diagnosis not present

## 2022-07-03 DIAGNOSIS — D631 Anemia in chronic kidney disease: Secondary | ICD-10-CM | POA: Diagnosis not present

## 2022-07-03 DIAGNOSIS — Z992 Dependence on renal dialysis: Secondary | ICD-10-CM | POA: Diagnosis not present

## 2022-07-03 DIAGNOSIS — E875 Hyperkalemia: Secondary | ICD-10-CM | POA: Diagnosis not present

## 2022-07-03 DIAGNOSIS — D509 Iron deficiency anemia, unspecified: Secondary | ICD-10-CM | POA: Diagnosis not present

## 2022-07-03 DIAGNOSIS — N2581 Secondary hyperparathyroidism of renal origin: Secondary | ICD-10-CM | POA: Diagnosis not present

## 2022-07-03 DIAGNOSIS — N186 End stage renal disease: Secondary | ICD-10-CM | POA: Diagnosis not present

## 2022-07-03 DIAGNOSIS — K769 Liver disease, unspecified: Secondary | ICD-10-CM | POA: Diagnosis not present

## 2022-07-03 DIAGNOSIS — Z79899 Other long term (current) drug therapy: Secondary | ICD-10-CM | POA: Diagnosis not present

## 2022-07-03 DIAGNOSIS — R82998 Other abnormal findings in urine: Secondary | ICD-10-CM | POA: Diagnosis not present

## 2022-07-04 DIAGNOSIS — N2581 Secondary hyperparathyroidism of renal origin: Secondary | ICD-10-CM | POA: Diagnosis not present

## 2022-07-04 DIAGNOSIS — D631 Anemia in chronic kidney disease: Secondary | ICD-10-CM | POA: Diagnosis not present

## 2022-07-04 DIAGNOSIS — K769 Liver disease, unspecified: Secondary | ICD-10-CM | POA: Diagnosis not present

## 2022-07-04 DIAGNOSIS — R82998 Other abnormal findings in urine: Secondary | ICD-10-CM | POA: Diagnosis not present

## 2022-07-04 DIAGNOSIS — E875 Hyperkalemia: Secondary | ICD-10-CM | POA: Diagnosis not present

## 2022-07-04 DIAGNOSIS — Z992 Dependence on renal dialysis: Secondary | ICD-10-CM | POA: Diagnosis not present

## 2022-07-04 DIAGNOSIS — D509 Iron deficiency anemia, unspecified: Secondary | ICD-10-CM | POA: Diagnosis not present

## 2022-07-04 DIAGNOSIS — N186 End stage renal disease: Secondary | ICD-10-CM | POA: Diagnosis not present

## 2022-07-04 DIAGNOSIS — Z79899 Other long term (current) drug therapy: Secondary | ICD-10-CM | POA: Diagnosis not present

## 2022-07-05 DIAGNOSIS — D509 Iron deficiency anemia, unspecified: Secondary | ICD-10-CM | POA: Diagnosis not present

## 2022-07-05 DIAGNOSIS — Z79899 Other long term (current) drug therapy: Secondary | ICD-10-CM | POA: Diagnosis not present

## 2022-07-05 DIAGNOSIS — K769 Liver disease, unspecified: Secondary | ICD-10-CM | POA: Diagnosis not present

## 2022-07-05 DIAGNOSIS — N2581 Secondary hyperparathyroidism of renal origin: Secondary | ICD-10-CM | POA: Diagnosis not present

## 2022-07-05 DIAGNOSIS — E875 Hyperkalemia: Secondary | ICD-10-CM | POA: Diagnosis not present

## 2022-07-05 DIAGNOSIS — N186 End stage renal disease: Secondary | ICD-10-CM | POA: Diagnosis not present

## 2022-07-05 DIAGNOSIS — D631 Anemia in chronic kidney disease: Secondary | ICD-10-CM | POA: Diagnosis not present

## 2022-07-05 DIAGNOSIS — R82998 Other abnormal findings in urine: Secondary | ICD-10-CM | POA: Diagnosis not present

## 2022-07-05 DIAGNOSIS — Z992 Dependence on renal dialysis: Secondary | ICD-10-CM | POA: Diagnosis not present

## 2022-07-06 DIAGNOSIS — N186 End stage renal disease: Secondary | ICD-10-CM | POA: Diagnosis not present

## 2022-07-06 DIAGNOSIS — R82998 Other abnormal findings in urine: Secondary | ICD-10-CM | POA: Diagnosis not present

## 2022-07-06 DIAGNOSIS — K769 Liver disease, unspecified: Secondary | ICD-10-CM | POA: Diagnosis not present

## 2022-07-06 DIAGNOSIS — E875 Hyperkalemia: Secondary | ICD-10-CM | POA: Diagnosis not present

## 2022-07-06 DIAGNOSIS — D509 Iron deficiency anemia, unspecified: Secondary | ICD-10-CM | POA: Diagnosis not present

## 2022-07-06 DIAGNOSIS — N2581 Secondary hyperparathyroidism of renal origin: Secondary | ICD-10-CM | POA: Diagnosis not present

## 2022-07-06 DIAGNOSIS — Z992 Dependence on renal dialysis: Secondary | ICD-10-CM | POA: Diagnosis not present

## 2022-07-06 DIAGNOSIS — Z79899 Other long term (current) drug therapy: Secondary | ICD-10-CM | POA: Diagnosis not present

## 2022-07-06 DIAGNOSIS — D631 Anemia in chronic kidney disease: Secondary | ICD-10-CM | POA: Diagnosis not present

## 2022-07-07 DIAGNOSIS — N186 End stage renal disease: Secondary | ICD-10-CM | POA: Diagnosis not present

## 2022-07-07 DIAGNOSIS — N2581 Secondary hyperparathyroidism of renal origin: Secondary | ICD-10-CM | POA: Diagnosis not present

## 2022-07-07 DIAGNOSIS — R82998 Other abnormal findings in urine: Secondary | ICD-10-CM | POA: Diagnosis not present

## 2022-07-07 DIAGNOSIS — Z992 Dependence on renal dialysis: Secondary | ICD-10-CM | POA: Diagnosis not present

## 2022-07-07 DIAGNOSIS — K769 Liver disease, unspecified: Secondary | ICD-10-CM | POA: Diagnosis not present

## 2022-07-07 DIAGNOSIS — D509 Iron deficiency anemia, unspecified: Secondary | ICD-10-CM | POA: Diagnosis not present

## 2022-07-07 DIAGNOSIS — E875 Hyperkalemia: Secondary | ICD-10-CM | POA: Diagnosis not present

## 2022-07-07 DIAGNOSIS — D631 Anemia in chronic kidney disease: Secondary | ICD-10-CM | POA: Diagnosis not present

## 2022-07-07 DIAGNOSIS — Z79899 Other long term (current) drug therapy: Secondary | ICD-10-CM | POA: Diagnosis not present

## 2022-07-08 DIAGNOSIS — D631 Anemia in chronic kidney disease: Secondary | ICD-10-CM | POA: Diagnosis not present

## 2022-07-08 DIAGNOSIS — N186 End stage renal disease: Secondary | ICD-10-CM | POA: Diagnosis not present

## 2022-07-08 DIAGNOSIS — N2581 Secondary hyperparathyroidism of renal origin: Secondary | ICD-10-CM | POA: Diagnosis not present

## 2022-07-08 DIAGNOSIS — K769 Liver disease, unspecified: Secondary | ICD-10-CM | POA: Diagnosis not present

## 2022-07-08 DIAGNOSIS — D509 Iron deficiency anemia, unspecified: Secondary | ICD-10-CM | POA: Diagnosis not present

## 2022-07-08 DIAGNOSIS — E875 Hyperkalemia: Secondary | ICD-10-CM | POA: Diagnosis not present

## 2022-07-08 DIAGNOSIS — Z992 Dependence on renal dialysis: Secondary | ICD-10-CM | POA: Diagnosis not present

## 2022-07-08 DIAGNOSIS — R82998 Other abnormal findings in urine: Secondary | ICD-10-CM | POA: Diagnosis not present

## 2022-07-08 DIAGNOSIS — Z79899 Other long term (current) drug therapy: Secondary | ICD-10-CM | POA: Diagnosis not present

## 2022-07-09 DIAGNOSIS — N186 End stage renal disease: Secondary | ICD-10-CM | POA: Diagnosis not present

## 2022-07-09 DIAGNOSIS — D631 Anemia in chronic kidney disease: Secondary | ICD-10-CM | POA: Diagnosis not present

## 2022-07-09 DIAGNOSIS — E875 Hyperkalemia: Secondary | ICD-10-CM | POA: Diagnosis not present

## 2022-07-09 DIAGNOSIS — Z992 Dependence on renal dialysis: Secondary | ICD-10-CM | POA: Diagnosis not present

## 2022-07-09 DIAGNOSIS — R82998 Other abnormal findings in urine: Secondary | ICD-10-CM | POA: Diagnosis not present

## 2022-07-09 DIAGNOSIS — N2581 Secondary hyperparathyroidism of renal origin: Secondary | ICD-10-CM | POA: Diagnosis not present

## 2022-07-09 DIAGNOSIS — D509 Iron deficiency anemia, unspecified: Secondary | ICD-10-CM | POA: Diagnosis not present

## 2022-07-09 DIAGNOSIS — Z79899 Other long term (current) drug therapy: Secondary | ICD-10-CM | POA: Diagnosis not present

## 2022-07-09 DIAGNOSIS — K769 Liver disease, unspecified: Secondary | ICD-10-CM | POA: Diagnosis not present

## 2022-07-10 DIAGNOSIS — N2581 Secondary hyperparathyroidism of renal origin: Secondary | ICD-10-CM | POA: Diagnosis not present

## 2022-07-10 DIAGNOSIS — N186 End stage renal disease: Secondary | ICD-10-CM | POA: Diagnosis not present

## 2022-07-10 DIAGNOSIS — R82998 Other abnormal findings in urine: Secondary | ICD-10-CM | POA: Diagnosis not present

## 2022-07-10 DIAGNOSIS — E875 Hyperkalemia: Secondary | ICD-10-CM | POA: Diagnosis not present

## 2022-07-10 DIAGNOSIS — D631 Anemia in chronic kidney disease: Secondary | ICD-10-CM | POA: Diagnosis not present

## 2022-07-10 DIAGNOSIS — Z79899 Other long term (current) drug therapy: Secondary | ICD-10-CM | POA: Diagnosis not present

## 2022-07-10 DIAGNOSIS — K769 Liver disease, unspecified: Secondary | ICD-10-CM | POA: Diagnosis not present

## 2022-07-10 DIAGNOSIS — Z992 Dependence on renal dialysis: Secondary | ICD-10-CM | POA: Diagnosis not present

## 2022-07-10 DIAGNOSIS — D509 Iron deficiency anemia, unspecified: Secondary | ICD-10-CM | POA: Diagnosis not present

## 2022-07-11 DIAGNOSIS — K769 Liver disease, unspecified: Secondary | ICD-10-CM | POA: Diagnosis not present

## 2022-07-11 DIAGNOSIS — E875 Hyperkalemia: Secondary | ICD-10-CM | POA: Diagnosis not present

## 2022-07-11 DIAGNOSIS — D509 Iron deficiency anemia, unspecified: Secondary | ICD-10-CM | POA: Diagnosis not present

## 2022-07-11 DIAGNOSIS — Z79899 Other long term (current) drug therapy: Secondary | ICD-10-CM | POA: Diagnosis not present

## 2022-07-11 DIAGNOSIS — Z992 Dependence on renal dialysis: Secondary | ICD-10-CM | POA: Diagnosis not present

## 2022-07-11 DIAGNOSIS — D631 Anemia in chronic kidney disease: Secondary | ICD-10-CM | POA: Diagnosis not present

## 2022-07-11 DIAGNOSIS — R82998 Other abnormal findings in urine: Secondary | ICD-10-CM | POA: Diagnosis not present

## 2022-07-11 DIAGNOSIS — N2581 Secondary hyperparathyroidism of renal origin: Secondary | ICD-10-CM | POA: Diagnosis not present

## 2022-07-11 DIAGNOSIS — N186 End stage renal disease: Secondary | ICD-10-CM | POA: Diagnosis not present

## 2022-07-12 DIAGNOSIS — R82998 Other abnormal findings in urine: Secondary | ICD-10-CM | POA: Diagnosis not present

## 2022-07-12 DIAGNOSIS — Z992 Dependence on renal dialysis: Secondary | ICD-10-CM | POA: Diagnosis not present

## 2022-07-12 DIAGNOSIS — N2581 Secondary hyperparathyroidism of renal origin: Secondary | ICD-10-CM | POA: Diagnosis not present

## 2022-07-12 DIAGNOSIS — N186 End stage renal disease: Secondary | ICD-10-CM | POA: Diagnosis not present

## 2022-07-12 DIAGNOSIS — D631 Anemia in chronic kidney disease: Secondary | ICD-10-CM | POA: Diagnosis not present

## 2022-07-12 DIAGNOSIS — E875 Hyperkalemia: Secondary | ICD-10-CM | POA: Diagnosis not present

## 2022-07-12 DIAGNOSIS — D509 Iron deficiency anemia, unspecified: Secondary | ICD-10-CM | POA: Diagnosis not present

## 2022-07-12 DIAGNOSIS — Z79899 Other long term (current) drug therapy: Secondary | ICD-10-CM | POA: Diagnosis not present

## 2022-07-12 DIAGNOSIS — K769 Liver disease, unspecified: Secondary | ICD-10-CM | POA: Diagnosis not present

## 2022-07-13 DIAGNOSIS — N186 End stage renal disease: Secondary | ICD-10-CM | POA: Diagnosis not present

## 2022-07-13 DIAGNOSIS — N2581 Secondary hyperparathyroidism of renal origin: Secondary | ICD-10-CM | POA: Diagnosis not present

## 2022-07-13 DIAGNOSIS — K769 Liver disease, unspecified: Secondary | ICD-10-CM | POA: Diagnosis not present

## 2022-07-13 DIAGNOSIS — Z992 Dependence on renal dialysis: Secondary | ICD-10-CM | POA: Diagnosis not present

## 2022-07-13 DIAGNOSIS — R82998 Other abnormal findings in urine: Secondary | ICD-10-CM | POA: Diagnosis not present

## 2022-07-13 DIAGNOSIS — E875 Hyperkalemia: Secondary | ICD-10-CM | POA: Diagnosis not present

## 2022-07-13 DIAGNOSIS — G4733 Obstructive sleep apnea (adult) (pediatric): Secondary | ICD-10-CM | POA: Diagnosis not present

## 2022-07-13 DIAGNOSIS — Z79899 Other long term (current) drug therapy: Secondary | ICD-10-CM | POA: Diagnosis not present

## 2022-07-13 DIAGNOSIS — D509 Iron deficiency anemia, unspecified: Secondary | ICD-10-CM | POA: Diagnosis not present

## 2022-07-13 DIAGNOSIS — D631 Anemia in chronic kidney disease: Secondary | ICD-10-CM | POA: Diagnosis not present

## 2022-07-14 DIAGNOSIS — R82998 Other abnormal findings in urine: Secondary | ICD-10-CM | POA: Diagnosis not present

## 2022-07-14 DIAGNOSIS — N2581 Secondary hyperparathyroidism of renal origin: Secondary | ICD-10-CM | POA: Diagnosis not present

## 2022-07-14 DIAGNOSIS — E875 Hyperkalemia: Secondary | ICD-10-CM | POA: Diagnosis not present

## 2022-07-14 DIAGNOSIS — K769 Liver disease, unspecified: Secondary | ICD-10-CM | POA: Diagnosis not present

## 2022-07-14 DIAGNOSIS — D631 Anemia in chronic kidney disease: Secondary | ICD-10-CM | POA: Diagnosis not present

## 2022-07-14 DIAGNOSIS — Z79899 Other long term (current) drug therapy: Secondary | ICD-10-CM | POA: Diagnosis not present

## 2022-07-14 DIAGNOSIS — Z992 Dependence on renal dialysis: Secondary | ICD-10-CM | POA: Diagnosis not present

## 2022-07-14 DIAGNOSIS — N186 End stage renal disease: Secondary | ICD-10-CM | POA: Diagnosis not present

## 2022-07-14 DIAGNOSIS — D509 Iron deficiency anemia, unspecified: Secondary | ICD-10-CM | POA: Diagnosis not present

## 2022-07-15 DIAGNOSIS — Z79899 Other long term (current) drug therapy: Secondary | ICD-10-CM | POA: Diagnosis not present

## 2022-07-15 DIAGNOSIS — N186 End stage renal disease: Secondary | ICD-10-CM | POA: Diagnosis not present

## 2022-07-15 DIAGNOSIS — N2581 Secondary hyperparathyroidism of renal origin: Secondary | ICD-10-CM | POA: Diagnosis not present

## 2022-07-15 DIAGNOSIS — D509 Iron deficiency anemia, unspecified: Secondary | ICD-10-CM | POA: Diagnosis not present

## 2022-07-15 DIAGNOSIS — D631 Anemia in chronic kidney disease: Secondary | ICD-10-CM | POA: Diagnosis not present

## 2022-07-15 DIAGNOSIS — R82998 Other abnormal findings in urine: Secondary | ICD-10-CM | POA: Diagnosis not present

## 2022-07-15 DIAGNOSIS — E875 Hyperkalemia: Secondary | ICD-10-CM | POA: Diagnosis not present

## 2022-07-15 DIAGNOSIS — K769 Liver disease, unspecified: Secondary | ICD-10-CM | POA: Diagnosis not present

## 2022-07-15 DIAGNOSIS — Z992 Dependence on renal dialysis: Secondary | ICD-10-CM | POA: Diagnosis not present

## 2022-07-16 DIAGNOSIS — D509 Iron deficiency anemia, unspecified: Secondary | ICD-10-CM | POA: Diagnosis not present

## 2022-07-16 DIAGNOSIS — R82998 Other abnormal findings in urine: Secondary | ICD-10-CM | POA: Diagnosis not present

## 2022-07-16 DIAGNOSIS — K769 Liver disease, unspecified: Secondary | ICD-10-CM | POA: Diagnosis not present

## 2022-07-16 DIAGNOSIS — Z79899 Other long term (current) drug therapy: Secondary | ICD-10-CM | POA: Diagnosis not present

## 2022-07-16 DIAGNOSIS — N186 End stage renal disease: Secondary | ICD-10-CM | POA: Diagnosis not present

## 2022-07-16 DIAGNOSIS — N2581 Secondary hyperparathyroidism of renal origin: Secondary | ICD-10-CM | POA: Diagnosis not present

## 2022-07-16 DIAGNOSIS — D631 Anemia in chronic kidney disease: Secondary | ICD-10-CM | POA: Diagnosis not present

## 2022-07-16 DIAGNOSIS — Z992 Dependence on renal dialysis: Secondary | ICD-10-CM | POA: Diagnosis not present

## 2022-07-16 DIAGNOSIS — E875 Hyperkalemia: Secondary | ICD-10-CM | POA: Diagnosis not present

## 2022-07-17 DIAGNOSIS — N2581 Secondary hyperparathyroidism of renal origin: Secondary | ICD-10-CM | POA: Diagnosis not present

## 2022-07-17 DIAGNOSIS — D631 Anemia in chronic kidney disease: Secondary | ICD-10-CM | POA: Diagnosis not present

## 2022-07-17 DIAGNOSIS — R82998 Other abnormal findings in urine: Secondary | ICD-10-CM | POA: Diagnosis not present

## 2022-07-17 DIAGNOSIS — E875 Hyperkalemia: Secondary | ICD-10-CM | POA: Diagnosis not present

## 2022-07-17 DIAGNOSIS — K769 Liver disease, unspecified: Secondary | ICD-10-CM | POA: Diagnosis not present

## 2022-07-17 DIAGNOSIS — Z79899 Other long term (current) drug therapy: Secondary | ICD-10-CM | POA: Diagnosis not present

## 2022-07-17 DIAGNOSIS — Z992 Dependence on renal dialysis: Secondary | ICD-10-CM | POA: Diagnosis not present

## 2022-07-17 DIAGNOSIS — D509 Iron deficiency anemia, unspecified: Secondary | ICD-10-CM | POA: Diagnosis not present

## 2022-07-17 DIAGNOSIS — N186 End stage renal disease: Secondary | ICD-10-CM | POA: Diagnosis not present

## 2022-07-18 DIAGNOSIS — R82998 Other abnormal findings in urine: Secondary | ICD-10-CM | POA: Diagnosis not present

## 2022-07-18 DIAGNOSIS — N2581 Secondary hyperparathyroidism of renal origin: Secondary | ICD-10-CM | POA: Diagnosis not present

## 2022-07-18 DIAGNOSIS — D631 Anemia in chronic kidney disease: Secondary | ICD-10-CM | POA: Diagnosis not present

## 2022-07-18 DIAGNOSIS — K769 Liver disease, unspecified: Secondary | ICD-10-CM | POA: Diagnosis not present

## 2022-07-18 DIAGNOSIS — D509 Iron deficiency anemia, unspecified: Secondary | ICD-10-CM | POA: Diagnosis not present

## 2022-07-18 DIAGNOSIS — Z79899 Other long term (current) drug therapy: Secondary | ICD-10-CM | POA: Diagnosis not present

## 2022-07-18 DIAGNOSIS — N186 End stage renal disease: Secondary | ICD-10-CM | POA: Diagnosis not present

## 2022-07-18 DIAGNOSIS — Z992 Dependence on renal dialysis: Secondary | ICD-10-CM | POA: Diagnosis not present

## 2022-07-18 DIAGNOSIS — E875 Hyperkalemia: Secondary | ICD-10-CM | POA: Diagnosis not present

## 2022-07-19 DIAGNOSIS — E875 Hyperkalemia: Secondary | ICD-10-CM | POA: Diagnosis not present

## 2022-07-19 DIAGNOSIS — N2581 Secondary hyperparathyroidism of renal origin: Secondary | ICD-10-CM | POA: Diagnosis not present

## 2022-07-19 DIAGNOSIS — Z79899 Other long term (current) drug therapy: Secondary | ICD-10-CM | POA: Diagnosis not present

## 2022-07-19 DIAGNOSIS — D631 Anemia in chronic kidney disease: Secondary | ICD-10-CM | POA: Diagnosis not present

## 2022-07-19 DIAGNOSIS — N186 End stage renal disease: Secondary | ICD-10-CM | POA: Diagnosis not present

## 2022-07-19 DIAGNOSIS — R82998 Other abnormal findings in urine: Secondary | ICD-10-CM | POA: Diagnosis not present

## 2022-07-19 DIAGNOSIS — D509 Iron deficiency anemia, unspecified: Secondary | ICD-10-CM | POA: Diagnosis not present

## 2022-07-19 DIAGNOSIS — K769 Liver disease, unspecified: Secondary | ICD-10-CM | POA: Diagnosis not present

## 2022-07-19 DIAGNOSIS — Z992 Dependence on renal dialysis: Secondary | ICD-10-CM | POA: Diagnosis not present

## 2022-07-20 DIAGNOSIS — Z992 Dependence on renal dialysis: Secondary | ICD-10-CM | POA: Diagnosis not present

## 2022-07-20 DIAGNOSIS — N2581 Secondary hyperparathyroidism of renal origin: Secondary | ICD-10-CM | POA: Diagnosis not present

## 2022-07-20 DIAGNOSIS — R82998 Other abnormal findings in urine: Secondary | ICD-10-CM | POA: Diagnosis not present

## 2022-07-20 DIAGNOSIS — Z79899 Other long term (current) drug therapy: Secondary | ICD-10-CM | POA: Diagnosis not present

## 2022-07-20 DIAGNOSIS — E875 Hyperkalemia: Secondary | ICD-10-CM | POA: Diagnosis not present

## 2022-07-20 DIAGNOSIS — D631 Anemia in chronic kidney disease: Secondary | ICD-10-CM | POA: Diagnosis not present

## 2022-07-20 DIAGNOSIS — D509 Iron deficiency anemia, unspecified: Secondary | ICD-10-CM | POA: Diagnosis not present

## 2022-07-20 DIAGNOSIS — K769 Liver disease, unspecified: Secondary | ICD-10-CM | POA: Diagnosis not present

## 2022-07-20 DIAGNOSIS — N186 End stage renal disease: Secondary | ICD-10-CM | POA: Diagnosis not present

## 2022-07-21 DIAGNOSIS — N2581 Secondary hyperparathyroidism of renal origin: Secondary | ICD-10-CM | POA: Diagnosis not present

## 2022-07-21 DIAGNOSIS — Z992 Dependence on renal dialysis: Secondary | ICD-10-CM | POA: Diagnosis not present

## 2022-07-21 DIAGNOSIS — K769 Liver disease, unspecified: Secondary | ICD-10-CM | POA: Diagnosis not present

## 2022-07-21 DIAGNOSIS — E875 Hyperkalemia: Secondary | ICD-10-CM | POA: Diagnosis not present

## 2022-07-21 DIAGNOSIS — R82998 Other abnormal findings in urine: Secondary | ICD-10-CM | POA: Diagnosis not present

## 2022-07-21 DIAGNOSIS — Z79899 Other long term (current) drug therapy: Secondary | ICD-10-CM | POA: Diagnosis not present

## 2022-07-21 DIAGNOSIS — D631 Anemia in chronic kidney disease: Secondary | ICD-10-CM | POA: Diagnosis not present

## 2022-07-21 DIAGNOSIS — N186 End stage renal disease: Secondary | ICD-10-CM | POA: Diagnosis not present

## 2022-07-21 DIAGNOSIS — D509 Iron deficiency anemia, unspecified: Secondary | ICD-10-CM | POA: Diagnosis not present

## 2022-07-22 DIAGNOSIS — E875 Hyperkalemia: Secondary | ICD-10-CM | POA: Diagnosis not present

## 2022-07-22 DIAGNOSIS — N2581 Secondary hyperparathyroidism of renal origin: Secondary | ICD-10-CM | POA: Diagnosis not present

## 2022-07-22 DIAGNOSIS — Z79899 Other long term (current) drug therapy: Secondary | ICD-10-CM | POA: Diagnosis not present

## 2022-07-22 DIAGNOSIS — D631 Anemia in chronic kidney disease: Secondary | ICD-10-CM | POA: Diagnosis not present

## 2022-07-22 DIAGNOSIS — R82998 Other abnormal findings in urine: Secondary | ICD-10-CM | POA: Diagnosis not present

## 2022-07-22 DIAGNOSIS — Z992 Dependence on renal dialysis: Secondary | ICD-10-CM | POA: Diagnosis not present

## 2022-07-22 DIAGNOSIS — K769 Liver disease, unspecified: Secondary | ICD-10-CM | POA: Diagnosis not present

## 2022-07-22 DIAGNOSIS — D509 Iron deficiency anemia, unspecified: Secondary | ICD-10-CM | POA: Diagnosis not present

## 2022-07-22 DIAGNOSIS — N186 End stage renal disease: Secondary | ICD-10-CM | POA: Diagnosis not present

## 2022-07-23 DIAGNOSIS — D509 Iron deficiency anemia, unspecified: Secondary | ICD-10-CM | POA: Diagnosis not present

## 2022-07-23 DIAGNOSIS — N2581 Secondary hyperparathyroidism of renal origin: Secondary | ICD-10-CM | POA: Diagnosis not present

## 2022-07-23 DIAGNOSIS — K769 Liver disease, unspecified: Secondary | ICD-10-CM | POA: Diagnosis not present

## 2022-07-23 DIAGNOSIS — E875 Hyperkalemia: Secondary | ICD-10-CM | POA: Diagnosis not present

## 2022-07-23 DIAGNOSIS — Z79899 Other long term (current) drug therapy: Secondary | ICD-10-CM | POA: Diagnosis not present

## 2022-07-23 DIAGNOSIS — Z992 Dependence on renal dialysis: Secondary | ICD-10-CM | POA: Diagnosis not present

## 2022-07-23 DIAGNOSIS — N186 End stage renal disease: Secondary | ICD-10-CM | POA: Diagnosis not present

## 2022-07-23 DIAGNOSIS — R82998 Other abnormal findings in urine: Secondary | ICD-10-CM | POA: Diagnosis not present

## 2022-07-23 DIAGNOSIS — D631 Anemia in chronic kidney disease: Secondary | ICD-10-CM | POA: Diagnosis not present

## 2022-07-24 DIAGNOSIS — N186 End stage renal disease: Secondary | ICD-10-CM | POA: Diagnosis not present

## 2022-07-24 DIAGNOSIS — Z992 Dependence on renal dialysis: Secondary | ICD-10-CM | POA: Diagnosis not present

## 2022-07-24 DIAGNOSIS — N2581 Secondary hyperparathyroidism of renal origin: Secondary | ICD-10-CM | POA: Diagnosis not present

## 2022-07-24 DIAGNOSIS — R82998 Other abnormal findings in urine: Secondary | ICD-10-CM | POA: Diagnosis not present

## 2022-07-24 DIAGNOSIS — E875 Hyperkalemia: Secondary | ICD-10-CM | POA: Diagnosis not present

## 2022-07-24 DIAGNOSIS — D509 Iron deficiency anemia, unspecified: Secondary | ICD-10-CM | POA: Diagnosis not present

## 2022-07-24 DIAGNOSIS — D631 Anemia in chronic kidney disease: Secondary | ICD-10-CM | POA: Diagnosis not present

## 2022-07-24 DIAGNOSIS — K769 Liver disease, unspecified: Secondary | ICD-10-CM | POA: Diagnosis not present

## 2022-07-24 DIAGNOSIS — Z79899 Other long term (current) drug therapy: Secondary | ICD-10-CM | POA: Diagnosis not present

## 2022-07-25 DIAGNOSIS — R82998 Other abnormal findings in urine: Secondary | ICD-10-CM | POA: Diagnosis not present

## 2022-07-25 DIAGNOSIS — N186 End stage renal disease: Secondary | ICD-10-CM | POA: Diagnosis not present

## 2022-07-25 DIAGNOSIS — D509 Iron deficiency anemia, unspecified: Secondary | ICD-10-CM | POA: Diagnosis not present

## 2022-07-25 DIAGNOSIS — N2581 Secondary hyperparathyroidism of renal origin: Secondary | ICD-10-CM | POA: Diagnosis not present

## 2022-07-25 DIAGNOSIS — Z992 Dependence on renal dialysis: Secondary | ICD-10-CM | POA: Diagnosis not present

## 2022-07-25 DIAGNOSIS — E875 Hyperkalemia: Secondary | ICD-10-CM | POA: Diagnosis not present

## 2022-07-25 DIAGNOSIS — D631 Anemia in chronic kidney disease: Secondary | ICD-10-CM | POA: Diagnosis not present

## 2022-07-25 DIAGNOSIS — Z79899 Other long term (current) drug therapy: Secondary | ICD-10-CM | POA: Diagnosis not present

## 2022-07-25 DIAGNOSIS — K769 Liver disease, unspecified: Secondary | ICD-10-CM | POA: Diagnosis not present

## 2022-07-26 DIAGNOSIS — Z992 Dependence on renal dialysis: Secondary | ICD-10-CM | POA: Diagnosis not present

## 2022-07-26 DIAGNOSIS — N2581 Secondary hyperparathyroidism of renal origin: Secondary | ICD-10-CM | POA: Diagnosis not present

## 2022-07-26 DIAGNOSIS — D509 Iron deficiency anemia, unspecified: Secondary | ICD-10-CM | POA: Diagnosis not present

## 2022-07-26 DIAGNOSIS — D631 Anemia in chronic kidney disease: Secondary | ICD-10-CM | POA: Diagnosis not present

## 2022-07-26 DIAGNOSIS — Z79899 Other long term (current) drug therapy: Secondary | ICD-10-CM | POA: Diagnosis not present

## 2022-07-26 DIAGNOSIS — N186 End stage renal disease: Secondary | ICD-10-CM | POA: Diagnosis not present

## 2022-07-26 DIAGNOSIS — K769 Liver disease, unspecified: Secondary | ICD-10-CM | POA: Diagnosis not present

## 2022-07-26 DIAGNOSIS — E875 Hyperkalemia: Secondary | ICD-10-CM | POA: Diagnosis not present

## 2022-07-26 DIAGNOSIS — R82998 Other abnormal findings in urine: Secondary | ICD-10-CM | POA: Diagnosis not present

## 2022-07-27 DIAGNOSIS — D631 Anemia in chronic kidney disease: Secondary | ICD-10-CM | POA: Diagnosis not present

## 2022-07-27 DIAGNOSIS — N186 End stage renal disease: Secondary | ICD-10-CM | POA: Diagnosis not present

## 2022-07-27 DIAGNOSIS — N2581 Secondary hyperparathyroidism of renal origin: Secondary | ICD-10-CM | POA: Diagnosis not present

## 2022-07-27 DIAGNOSIS — Z79899 Other long term (current) drug therapy: Secondary | ICD-10-CM | POA: Diagnosis not present

## 2022-07-27 DIAGNOSIS — E875 Hyperkalemia: Secondary | ICD-10-CM | POA: Diagnosis not present

## 2022-07-27 DIAGNOSIS — K769 Liver disease, unspecified: Secondary | ICD-10-CM | POA: Diagnosis not present

## 2022-07-27 DIAGNOSIS — R82998 Other abnormal findings in urine: Secondary | ICD-10-CM | POA: Diagnosis not present

## 2022-07-27 DIAGNOSIS — D509 Iron deficiency anemia, unspecified: Secondary | ICD-10-CM | POA: Diagnosis not present

## 2022-07-27 DIAGNOSIS — Z992 Dependence on renal dialysis: Secondary | ICD-10-CM | POA: Diagnosis not present

## 2022-07-28 DIAGNOSIS — Z79899 Other long term (current) drug therapy: Secondary | ICD-10-CM | POA: Diagnosis not present

## 2022-07-28 DIAGNOSIS — D631 Anemia in chronic kidney disease: Secondary | ICD-10-CM | POA: Diagnosis not present

## 2022-07-28 DIAGNOSIS — E875 Hyperkalemia: Secondary | ICD-10-CM | POA: Diagnosis not present

## 2022-07-28 DIAGNOSIS — K769 Liver disease, unspecified: Secondary | ICD-10-CM | POA: Diagnosis not present

## 2022-07-28 DIAGNOSIS — N2581 Secondary hyperparathyroidism of renal origin: Secondary | ICD-10-CM | POA: Diagnosis not present

## 2022-07-28 DIAGNOSIS — Z992 Dependence on renal dialysis: Secondary | ICD-10-CM | POA: Diagnosis not present

## 2022-07-28 DIAGNOSIS — N186 End stage renal disease: Secondary | ICD-10-CM | POA: Diagnosis not present

## 2022-07-28 DIAGNOSIS — R82998 Other abnormal findings in urine: Secondary | ICD-10-CM | POA: Diagnosis not present

## 2022-07-28 DIAGNOSIS — D509 Iron deficiency anemia, unspecified: Secondary | ICD-10-CM | POA: Diagnosis not present

## 2022-07-29 DIAGNOSIS — D509 Iron deficiency anemia, unspecified: Secondary | ICD-10-CM | POA: Diagnosis not present

## 2022-07-29 DIAGNOSIS — Z79899 Other long term (current) drug therapy: Secondary | ICD-10-CM | POA: Diagnosis not present

## 2022-07-29 DIAGNOSIS — N2581 Secondary hyperparathyroidism of renal origin: Secondary | ICD-10-CM | POA: Diagnosis not present

## 2022-07-29 DIAGNOSIS — R82998 Other abnormal findings in urine: Secondary | ICD-10-CM | POA: Diagnosis not present

## 2022-07-29 DIAGNOSIS — D631 Anemia in chronic kidney disease: Secondary | ICD-10-CM | POA: Diagnosis not present

## 2022-07-29 DIAGNOSIS — K769 Liver disease, unspecified: Secondary | ICD-10-CM | POA: Diagnosis not present

## 2022-07-29 DIAGNOSIS — N186 End stage renal disease: Secondary | ICD-10-CM | POA: Diagnosis not present

## 2022-07-29 DIAGNOSIS — Z992 Dependence on renal dialysis: Secondary | ICD-10-CM | POA: Diagnosis not present

## 2022-07-29 DIAGNOSIS — E875 Hyperkalemia: Secondary | ICD-10-CM | POA: Diagnosis not present

## 2022-07-30 DIAGNOSIS — K769 Liver disease, unspecified: Secondary | ICD-10-CM | POA: Diagnosis not present

## 2022-07-30 DIAGNOSIS — Z992 Dependence on renal dialysis: Secondary | ICD-10-CM | POA: Diagnosis not present

## 2022-07-30 DIAGNOSIS — N186 End stage renal disease: Secondary | ICD-10-CM | POA: Diagnosis not present

## 2022-07-30 DIAGNOSIS — R82998 Other abnormal findings in urine: Secondary | ICD-10-CM | POA: Diagnosis not present

## 2022-07-30 DIAGNOSIS — D509 Iron deficiency anemia, unspecified: Secondary | ICD-10-CM | POA: Diagnosis not present

## 2022-07-30 DIAGNOSIS — N2581 Secondary hyperparathyroidism of renal origin: Secondary | ICD-10-CM | POA: Diagnosis not present

## 2022-07-30 DIAGNOSIS — D631 Anemia in chronic kidney disease: Secondary | ICD-10-CM | POA: Diagnosis not present

## 2022-07-30 DIAGNOSIS — Z79899 Other long term (current) drug therapy: Secondary | ICD-10-CM | POA: Diagnosis not present

## 2022-07-30 DIAGNOSIS — E875 Hyperkalemia: Secondary | ICD-10-CM | POA: Diagnosis not present

## 2022-07-31 DIAGNOSIS — K769 Liver disease, unspecified: Secondary | ICD-10-CM | POA: Diagnosis not present

## 2022-07-31 DIAGNOSIS — E875 Hyperkalemia: Secondary | ICD-10-CM | POA: Diagnosis not present

## 2022-07-31 DIAGNOSIS — Z79899 Other long term (current) drug therapy: Secondary | ICD-10-CM | POA: Diagnosis not present

## 2022-07-31 DIAGNOSIS — N2581 Secondary hyperparathyroidism of renal origin: Secondary | ICD-10-CM | POA: Diagnosis not present

## 2022-07-31 DIAGNOSIS — N186 End stage renal disease: Secondary | ICD-10-CM | POA: Diagnosis not present

## 2022-07-31 DIAGNOSIS — R82998 Other abnormal findings in urine: Secondary | ICD-10-CM | POA: Diagnosis not present

## 2022-07-31 DIAGNOSIS — D631 Anemia in chronic kidney disease: Secondary | ICD-10-CM | POA: Diagnosis not present

## 2022-07-31 DIAGNOSIS — Z992 Dependence on renal dialysis: Secondary | ICD-10-CM | POA: Diagnosis not present

## 2022-07-31 DIAGNOSIS — D509 Iron deficiency anemia, unspecified: Secondary | ICD-10-CM | POA: Diagnosis not present

## 2022-08-01 DIAGNOSIS — N2581 Secondary hyperparathyroidism of renal origin: Secondary | ICD-10-CM | POA: Diagnosis not present

## 2022-08-01 DIAGNOSIS — Z992 Dependence on renal dialysis: Secondary | ICD-10-CM | POA: Diagnosis not present

## 2022-08-01 DIAGNOSIS — E875 Hyperkalemia: Secondary | ICD-10-CM | POA: Diagnosis not present

## 2022-08-01 DIAGNOSIS — N186 End stage renal disease: Secondary | ICD-10-CM | POA: Diagnosis not present

## 2022-08-01 DIAGNOSIS — K769 Liver disease, unspecified: Secondary | ICD-10-CM | POA: Diagnosis not present

## 2022-08-01 DIAGNOSIS — D509 Iron deficiency anemia, unspecified: Secondary | ICD-10-CM | POA: Diagnosis not present

## 2022-08-01 DIAGNOSIS — D631 Anemia in chronic kidney disease: Secondary | ICD-10-CM | POA: Diagnosis not present

## 2022-08-01 DIAGNOSIS — Z79899 Other long term (current) drug therapy: Secondary | ICD-10-CM | POA: Diagnosis not present

## 2022-08-01 DIAGNOSIS — R82998 Other abnormal findings in urine: Secondary | ICD-10-CM | POA: Diagnosis not present

## 2022-08-02 DIAGNOSIS — N186 End stage renal disease: Secondary | ICD-10-CM | POA: Diagnosis not present

## 2022-08-02 DIAGNOSIS — R82998 Other abnormal findings in urine: Secondary | ICD-10-CM | POA: Diagnosis not present

## 2022-08-02 DIAGNOSIS — E440 Moderate protein-calorie malnutrition: Secondary | ICD-10-CM | POA: Diagnosis not present

## 2022-08-02 DIAGNOSIS — Z992 Dependence on renal dialysis: Secondary | ICD-10-CM | POA: Diagnosis not present

## 2022-08-02 DIAGNOSIS — K769 Liver disease, unspecified: Secondary | ICD-10-CM | POA: Diagnosis not present

## 2022-08-02 DIAGNOSIS — N2589 Other disorders resulting from impaired renal tubular function: Secondary | ICD-10-CM | POA: Diagnosis not present

## 2022-08-02 DIAGNOSIS — D631 Anemia in chronic kidney disease: Secondary | ICD-10-CM | POA: Diagnosis not present

## 2022-08-02 DIAGNOSIS — D509 Iron deficiency anemia, unspecified: Secondary | ICD-10-CM | POA: Diagnosis not present

## 2022-08-02 DIAGNOSIS — E1129 Type 2 diabetes mellitus with other diabetic kidney complication: Secondary | ICD-10-CM | POA: Diagnosis not present

## 2022-08-02 DIAGNOSIS — N2581 Secondary hyperparathyroidism of renal origin: Secondary | ICD-10-CM | POA: Diagnosis not present

## 2022-08-03 DIAGNOSIS — D631 Anemia in chronic kidney disease: Secondary | ICD-10-CM | POA: Diagnosis not present

## 2022-08-03 DIAGNOSIS — N2589 Other disorders resulting from impaired renal tubular function: Secondary | ICD-10-CM | POA: Diagnosis not present

## 2022-08-03 DIAGNOSIS — E440 Moderate protein-calorie malnutrition: Secondary | ICD-10-CM | POA: Diagnosis not present

## 2022-08-03 DIAGNOSIS — Z992 Dependence on renal dialysis: Secondary | ICD-10-CM | POA: Diagnosis not present

## 2022-08-03 DIAGNOSIS — E1129 Type 2 diabetes mellitus with other diabetic kidney complication: Secondary | ICD-10-CM | POA: Diagnosis not present

## 2022-08-03 DIAGNOSIS — K769 Liver disease, unspecified: Secondary | ICD-10-CM | POA: Diagnosis not present

## 2022-08-03 DIAGNOSIS — R82998 Other abnormal findings in urine: Secondary | ICD-10-CM | POA: Diagnosis not present

## 2022-08-03 DIAGNOSIS — N186 End stage renal disease: Secondary | ICD-10-CM | POA: Diagnosis not present

## 2022-08-03 DIAGNOSIS — D509 Iron deficiency anemia, unspecified: Secondary | ICD-10-CM | POA: Diagnosis not present

## 2022-08-03 DIAGNOSIS — N2581 Secondary hyperparathyroidism of renal origin: Secondary | ICD-10-CM | POA: Diagnosis not present

## 2022-08-04 DIAGNOSIS — N186 End stage renal disease: Secondary | ICD-10-CM | POA: Diagnosis not present

## 2022-08-04 DIAGNOSIS — R82998 Other abnormal findings in urine: Secondary | ICD-10-CM | POA: Diagnosis not present

## 2022-08-04 DIAGNOSIS — E1129 Type 2 diabetes mellitus with other diabetic kidney complication: Secondary | ICD-10-CM | POA: Diagnosis not present

## 2022-08-04 DIAGNOSIS — Z992 Dependence on renal dialysis: Secondary | ICD-10-CM | POA: Diagnosis not present

## 2022-08-04 DIAGNOSIS — N2589 Other disorders resulting from impaired renal tubular function: Secondary | ICD-10-CM | POA: Diagnosis not present

## 2022-08-04 DIAGNOSIS — N2581 Secondary hyperparathyroidism of renal origin: Secondary | ICD-10-CM | POA: Diagnosis not present

## 2022-08-04 DIAGNOSIS — D631 Anemia in chronic kidney disease: Secondary | ICD-10-CM | POA: Diagnosis not present

## 2022-08-04 DIAGNOSIS — D509 Iron deficiency anemia, unspecified: Secondary | ICD-10-CM | POA: Diagnosis not present

## 2022-08-04 DIAGNOSIS — E440 Moderate protein-calorie malnutrition: Secondary | ICD-10-CM | POA: Diagnosis not present

## 2022-08-04 DIAGNOSIS — K769 Liver disease, unspecified: Secondary | ICD-10-CM | POA: Diagnosis not present

## 2022-08-05 DIAGNOSIS — Z992 Dependence on renal dialysis: Secondary | ICD-10-CM | POA: Diagnosis not present

## 2022-08-05 DIAGNOSIS — N2589 Other disorders resulting from impaired renal tubular function: Secondary | ICD-10-CM | POA: Diagnosis not present

## 2022-08-05 DIAGNOSIS — N2581 Secondary hyperparathyroidism of renal origin: Secondary | ICD-10-CM | POA: Diagnosis not present

## 2022-08-05 DIAGNOSIS — D509 Iron deficiency anemia, unspecified: Secondary | ICD-10-CM | POA: Diagnosis not present

## 2022-08-05 DIAGNOSIS — R82998 Other abnormal findings in urine: Secondary | ICD-10-CM | POA: Diagnosis not present

## 2022-08-05 DIAGNOSIS — E440 Moderate protein-calorie malnutrition: Secondary | ICD-10-CM | POA: Diagnosis not present

## 2022-08-05 DIAGNOSIS — D631 Anemia in chronic kidney disease: Secondary | ICD-10-CM | POA: Diagnosis not present

## 2022-08-05 DIAGNOSIS — E1129 Type 2 diabetes mellitus with other diabetic kidney complication: Secondary | ICD-10-CM | POA: Diagnosis not present

## 2022-08-05 DIAGNOSIS — N186 End stage renal disease: Secondary | ICD-10-CM | POA: Diagnosis not present

## 2022-08-05 DIAGNOSIS — K769 Liver disease, unspecified: Secondary | ICD-10-CM | POA: Diagnosis not present

## 2022-08-06 DIAGNOSIS — R82998 Other abnormal findings in urine: Secondary | ICD-10-CM | POA: Diagnosis not present

## 2022-08-06 DIAGNOSIS — Z992 Dependence on renal dialysis: Secondary | ICD-10-CM | POA: Diagnosis not present

## 2022-08-06 DIAGNOSIS — E1129 Type 2 diabetes mellitus with other diabetic kidney complication: Secondary | ICD-10-CM | POA: Diagnosis not present

## 2022-08-06 DIAGNOSIS — E440 Moderate protein-calorie malnutrition: Secondary | ICD-10-CM | POA: Diagnosis not present

## 2022-08-06 DIAGNOSIS — D509 Iron deficiency anemia, unspecified: Secondary | ICD-10-CM | POA: Diagnosis not present

## 2022-08-06 DIAGNOSIS — D631 Anemia in chronic kidney disease: Secondary | ICD-10-CM | POA: Diagnosis not present

## 2022-08-06 DIAGNOSIS — K769 Liver disease, unspecified: Secondary | ICD-10-CM | POA: Diagnosis not present

## 2022-08-06 DIAGNOSIS — N186 End stage renal disease: Secondary | ICD-10-CM | POA: Diagnosis not present

## 2022-08-06 DIAGNOSIS — N2581 Secondary hyperparathyroidism of renal origin: Secondary | ICD-10-CM | POA: Diagnosis not present

## 2022-08-06 DIAGNOSIS — N2589 Other disorders resulting from impaired renal tubular function: Secondary | ICD-10-CM | POA: Diagnosis not present

## 2022-08-07 DIAGNOSIS — E440 Moderate protein-calorie malnutrition: Secondary | ICD-10-CM | POA: Diagnosis not present

## 2022-08-07 DIAGNOSIS — D509 Iron deficiency anemia, unspecified: Secondary | ICD-10-CM | POA: Diagnosis not present

## 2022-08-07 DIAGNOSIS — Z992 Dependence on renal dialysis: Secondary | ICD-10-CM | POA: Diagnosis not present

## 2022-08-07 DIAGNOSIS — K769 Liver disease, unspecified: Secondary | ICD-10-CM | POA: Diagnosis not present

## 2022-08-07 DIAGNOSIS — N186 End stage renal disease: Secondary | ICD-10-CM | POA: Diagnosis not present

## 2022-08-07 DIAGNOSIS — D631 Anemia in chronic kidney disease: Secondary | ICD-10-CM | POA: Diagnosis not present

## 2022-08-07 DIAGNOSIS — R82998 Other abnormal findings in urine: Secondary | ICD-10-CM | POA: Diagnosis not present

## 2022-08-07 DIAGNOSIS — N2581 Secondary hyperparathyroidism of renal origin: Secondary | ICD-10-CM | POA: Diagnosis not present

## 2022-08-07 DIAGNOSIS — N2589 Other disorders resulting from impaired renal tubular function: Secondary | ICD-10-CM | POA: Diagnosis not present

## 2022-08-07 DIAGNOSIS — E1129 Type 2 diabetes mellitus with other diabetic kidney complication: Secondary | ICD-10-CM | POA: Diagnosis not present

## 2022-08-08 DIAGNOSIS — E1129 Type 2 diabetes mellitus with other diabetic kidney complication: Secondary | ICD-10-CM | POA: Diagnosis not present

## 2022-08-08 DIAGNOSIS — E440 Moderate protein-calorie malnutrition: Secondary | ICD-10-CM | POA: Diagnosis not present

## 2022-08-08 DIAGNOSIS — N186 End stage renal disease: Secondary | ICD-10-CM | POA: Diagnosis not present

## 2022-08-08 DIAGNOSIS — R82998 Other abnormal findings in urine: Secondary | ICD-10-CM | POA: Diagnosis not present

## 2022-08-08 DIAGNOSIS — Z992 Dependence on renal dialysis: Secondary | ICD-10-CM | POA: Diagnosis not present

## 2022-08-08 DIAGNOSIS — D631 Anemia in chronic kidney disease: Secondary | ICD-10-CM | POA: Diagnosis not present

## 2022-08-08 DIAGNOSIS — D509 Iron deficiency anemia, unspecified: Secondary | ICD-10-CM | POA: Diagnosis not present

## 2022-08-08 DIAGNOSIS — N2589 Other disorders resulting from impaired renal tubular function: Secondary | ICD-10-CM | POA: Diagnosis not present

## 2022-08-08 DIAGNOSIS — N2581 Secondary hyperparathyroidism of renal origin: Secondary | ICD-10-CM | POA: Diagnosis not present

## 2022-08-08 DIAGNOSIS — K769 Liver disease, unspecified: Secondary | ICD-10-CM | POA: Diagnosis not present

## 2022-08-09 DIAGNOSIS — Z992 Dependence on renal dialysis: Secondary | ICD-10-CM | POA: Diagnosis not present

## 2022-08-09 DIAGNOSIS — N186 End stage renal disease: Secondary | ICD-10-CM | POA: Diagnosis not present

## 2022-08-09 DIAGNOSIS — R82998 Other abnormal findings in urine: Secondary | ICD-10-CM | POA: Diagnosis not present

## 2022-08-09 DIAGNOSIS — K769 Liver disease, unspecified: Secondary | ICD-10-CM | POA: Diagnosis not present

## 2022-08-09 DIAGNOSIS — N2589 Other disorders resulting from impaired renal tubular function: Secondary | ICD-10-CM | POA: Diagnosis not present

## 2022-08-09 DIAGNOSIS — D509 Iron deficiency anemia, unspecified: Secondary | ICD-10-CM | POA: Diagnosis not present

## 2022-08-09 DIAGNOSIS — E440 Moderate protein-calorie malnutrition: Secondary | ICD-10-CM | POA: Diagnosis not present

## 2022-08-09 DIAGNOSIS — E1129 Type 2 diabetes mellitus with other diabetic kidney complication: Secondary | ICD-10-CM | POA: Diagnosis not present

## 2022-08-09 DIAGNOSIS — D631 Anemia in chronic kidney disease: Secondary | ICD-10-CM | POA: Diagnosis not present

## 2022-08-09 DIAGNOSIS — N2581 Secondary hyperparathyroidism of renal origin: Secondary | ICD-10-CM | POA: Diagnosis not present

## 2022-08-10 DIAGNOSIS — K769 Liver disease, unspecified: Secondary | ICD-10-CM | POA: Diagnosis not present

## 2022-08-10 DIAGNOSIS — N186 End stage renal disease: Secondary | ICD-10-CM | POA: Diagnosis not present

## 2022-08-10 DIAGNOSIS — D631 Anemia in chronic kidney disease: Secondary | ICD-10-CM | POA: Diagnosis not present

## 2022-08-10 DIAGNOSIS — N2589 Other disorders resulting from impaired renal tubular function: Secondary | ICD-10-CM | POA: Diagnosis not present

## 2022-08-10 DIAGNOSIS — D509 Iron deficiency anemia, unspecified: Secondary | ICD-10-CM | POA: Diagnosis not present

## 2022-08-10 DIAGNOSIS — N2581 Secondary hyperparathyroidism of renal origin: Secondary | ICD-10-CM | POA: Diagnosis not present

## 2022-08-10 DIAGNOSIS — R82998 Other abnormal findings in urine: Secondary | ICD-10-CM | POA: Diagnosis not present

## 2022-08-10 DIAGNOSIS — E440 Moderate protein-calorie malnutrition: Secondary | ICD-10-CM | POA: Diagnosis not present

## 2022-08-10 DIAGNOSIS — Z992 Dependence on renal dialysis: Secondary | ICD-10-CM | POA: Diagnosis not present

## 2022-08-10 DIAGNOSIS — E1129 Type 2 diabetes mellitus with other diabetic kidney complication: Secondary | ICD-10-CM | POA: Diagnosis not present

## 2022-08-11 DIAGNOSIS — R82998 Other abnormal findings in urine: Secondary | ICD-10-CM | POA: Diagnosis not present

## 2022-08-11 DIAGNOSIS — N2589 Other disorders resulting from impaired renal tubular function: Secondary | ICD-10-CM | POA: Diagnosis not present

## 2022-08-11 DIAGNOSIS — E1129 Type 2 diabetes mellitus with other diabetic kidney complication: Secondary | ICD-10-CM | POA: Diagnosis not present

## 2022-08-11 DIAGNOSIS — D509 Iron deficiency anemia, unspecified: Secondary | ICD-10-CM | POA: Diagnosis not present

## 2022-08-11 DIAGNOSIS — N186 End stage renal disease: Secondary | ICD-10-CM | POA: Diagnosis not present

## 2022-08-11 DIAGNOSIS — E440 Moderate protein-calorie malnutrition: Secondary | ICD-10-CM | POA: Diagnosis not present

## 2022-08-11 DIAGNOSIS — D631 Anemia in chronic kidney disease: Secondary | ICD-10-CM | POA: Diagnosis not present

## 2022-08-11 DIAGNOSIS — N2581 Secondary hyperparathyroidism of renal origin: Secondary | ICD-10-CM | POA: Diagnosis not present

## 2022-08-11 DIAGNOSIS — Z992 Dependence on renal dialysis: Secondary | ICD-10-CM | POA: Diagnosis not present

## 2022-08-11 DIAGNOSIS — K769 Liver disease, unspecified: Secondary | ICD-10-CM | POA: Diagnosis not present

## 2022-08-12 DIAGNOSIS — K769 Liver disease, unspecified: Secondary | ICD-10-CM | POA: Diagnosis not present

## 2022-08-12 DIAGNOSIS — N2589 Other disorders resulting from impaired renal tubular function: Secondary | ICD-10-CM | POA: Diagnosis not present

## 2022-08-12 DIAGNOSIS — E1129 Type 2 diabetes mellitus with other diabetic kidney complication: Secondary | ICD-10-CM | POA: Diagnosis not present

## 2022-08-12 DIAGNOSIS — D509 Iron deficiency anemia, unspecified: Secondary | ICD-10-CM | POA: Diagnosis not present

## 2022-08-12 DIAGNOSIS — N186 End stage renal disease: Secondary | ICD-10-CM | POA: Diagnosis not present

## 2022-08-12 DIAGNOSIS — Z992 Dependence on renal dialysis: Secondary | ICD-10-CM | POA: Diagnosis not present

## 2022-08-12 DIAGNOSIS — D631 Anemia in chronic kidney disease: Secondary | ICD-10-CM | POA: Diagnosis not present

## 2022-08-12 DIAGNOSIS — N2581 Secondary hyperparathyroidism of renal origin: Secondary | ICD-10-CM | POA: Diagnosis not present

## 2022-08-12 DIAGNOSIS — E440 Moderate protein-calorie malnutrition: Secondary | ICD-10-CM | POA: Diagnosis not present

## 2022-08-12 DIAGNOSIS — R82998 Other abnormal findings in urine: Secondary | ICD-10-CM | POA: Diagnosis not present

## 2022-08-13 DIAGNOSIS — N186 End stage renal disease: Secondary | ICD-10-CM | POA: Diagnosis not present

## 2022-08-13 DIAGNOSIS — N2581 Secondary hyperparathyroidism of renal origin: Secondary | ICD-10-CM | POA: Diagnosis not present

## 2022-08-13 DIAGNOSIS — E440 Moderate protein-calorie malnutrition: Secondary | ICD-10-CM | POA: Diagnosis not present

## 2022-08-13 DIAGNOSIS — N2589 Other disorders resulting from impaired renal tubular function: Secondary | ICD-10-CM | POA: Diagnosis not present

## 2022-08-13 DIAGNOSIS — K769 Liver disease, unspecified: Secondary | ICD-10-CM | POA: Diagnosis not present

## 2022-08-13 DIAGNOSIS — R82998 Other abnormal findings in urine: Secondary | ICD-10-CM | POA: Diagnosis not present

## 2022-08-13 DIAGNOSIS — Z992 Dependence on renal dialysis: Secondary | ICD-10-CM | POA: Diagnosis not present

## 2022-08-13 DIAGNOSIS — E1129 Type 2 diabetes mellitus with other diabetic kidney complication: Secondary | ICD-10-CM | POA: Diagnosis not present

## 2022-08-13 DIAGNOSIS — D631 Anemia in chronic kidney disease: Secondary | ICD-10-CM | POA: Diagnosis not present

## 2022-08-13 DIAGNOSIS — G4733 Obstructive sleep apnea (adult) (pediatric): Secondary | ICD-10-CM | POA: Diagnosis not present

## 2022-08-13 DIAGNOSIS — D509 Iron deficiency anemia, unspecified: Secondary | ICD-10-CM | POA: Diagnosis not present

## 2022-08-14 DIAGNOSIS — E1129 Type 2 diabetes mellitus with other diabetic kidney complication: Secondary | ICD-10-CM | POA: Diagnosis not present

## 2022-08-14 DIAGNOSIS — R82998 Other abnormal findings in urine: Secondary | ICD-10-CM | POA: Diagnosis not present

## 2022-08-14 DIAGNOSIS — E440 Moderate protein-calorie malnutrition: Secondary | ICD-10-CM | POA: Diagnosis not present

## 2022-08-14 DIAGNOSIS — N2581 Secondary hyperparathyroidism of renal origin: Secondary | ICD-10-CM | POA: Diagnosis not present

## 2022-08-14 DIAGNOSIS — D631 Anemia in chronic kidney disease: Secondary | ICD-10-CM | POA: Diagnosis not present

## 2022-08-14 DIAGNOSIS — N186 End stage renal disease: Secondary | ICD-10-CM | POA: Diagnosis not present

## 2022-08-14 DIAGNOSIS — N2589 Other disorders resulting from impaired renal tubular function: Secondary | ICD-10-CM | POA: Diagnosis not present

## 2022-08-14 DIAGNOSIS — D509 Iron deficiency anemia, unspecified: Secondary | ICD-10-CM | POA: Diagnosis not present

## 2022-08-14 DIAGNOSIS — K769 Liver disease, unspecified: Secondary | ICD-10-CM | POA: Diagnosis not present

## 2022-08-14 DIAGNOSIS — Z992 Dependence on renal dialysis: Secondary | ICD-10-CM | POA: Diagnosis not present

## 2022-08-15 DIAGNOSIS — Z992 Dependence on renal dialysis: Secondary | ICD-10-CM | POA: Diagnosis not present

## 2022-08-15 DIAGNOSIS — N2589 Other disorders resulting from impaired renal tubular function: Secondary | ICD-10-CM | POA: Diagnosis not present

## 2022-08-15 DIAGNOSIS — R82998 Other abnormal findings in urine: Secondary | ICD-10-CM | POA: Diagnosis not present

## 2022-08-15 DIAGNOSIS — D509 Iron deficiency anemia, unspecified: Secondary | ICD-10-CM | POA: Diagnosis not present

## 2022-08-15 DIAGNOSIS — N186 End stage renal disease: Secondary | ICD-10-CM | POA: Diagnosis not present

## 2022-08-15 DIAGNOSIS — N2581 Secondary hyperparathyroidism of renal origin: Secondary | ICD-10-CM | POA: Diagnosis not present

## 2022-08-15 DIAGNOSIS — E1129 Type 2 diabetes mellitus with other diabetic kidney complication: Secondary | ICD-10-CM | POA: Diagnosis not present

## 2022-08-15 DIAGNOSIS — D631 Anemia in chronic kidney disease: Secondary | ICD-10-CM | POA: Diagnosis not present

## 2022-08-15 DIAGNOSIS — K769 Liver disease, unspecified: Secondary | ICD-10-CM | POA: Diagnosis not present

## 2022-08-15 DIAGNOSIS — E440 Moderate protein-calorie malnutrition: Secondary | ICD-10-CM | POA: Diagnosis not present

## 2022-08-16 DIAGNOSIS — D509 Iron deficiency anemia, unspecified: Secondary | ICD-10-CM | POA: Diagnosis not present

## 2022-08-16 DIAGNOSIS — E440 Moderate protein-calorie malnutrition: Secondary | ICD-10-CM | POA: Diagnosis not present

## 2022-08-16 DIAGNOSIS — R82998 Other abnormal findings in urine: Secondary | ICD-10-CM | POA: Diagnosis not present

## 2022-08-16 DIAGNOSIS — K769 Liver disease, unspecified: Secondary | ICD-10-CM | POA: Diagnosis not present

## 2022-08-16 DIAGNOSIS — Z992 Dependence on renal dialysis: Secondary | ICD-10-CM | POA: Diagnosis not present

## 2022-08-16 DIAGNOSIS — N2589 Other disorders resulting from impaired renal tubular function: Secondary | ICD-10-CM | POA: Diagnosis not present

## 2022-08-16 DIAGNOSIS — D631 Anemia in chronic kidney disease: Secondary | ICD-10-CM | POA: Diagnosis not present

## 2022-08-16 DIAGNOSIS — N186 End stage renal disease: Secondary | ICD-10-CM | POA: Diagnosis not present

## 2022-08-16 DIAGNOSIS — N2581 Secondary hyperparathyroidism of renal origin: Secondary | ICD-10-CM | POA: Diagnosis not present

## 2022-08-16 DIAGNOSIS — E1129 Type 2 diabetes mellitus with other diabetic kidney complication: Secondary | ICD-10-CM | POA: Diagnosis not present

## 2022-08-17 DIAGNOSIS — D631 Anemia in chronic kidney disease: Secondary | ICD-10-CM | POA: Diagnosis not present

## 2022-08-17 DIAGNOSIS — N2581 Secondary hyperparathyroidism of renal origin: Secondary | ICD-10-CM | POA: Diagnosis not present

## 2022-08-17 DIAGNOSIS — N186 End stage renal disease: Secondary | ICD-10-CM | POA: Diagnosis not present

## 2022-08-17 DIAGNOSIS — K769 Liver disease, unspecified: Secondary | ICD-10-CM | POA: Diagnosis not present

## 2022-08-17 DIAGNOSIS — D509 Iron deficiency anemia, unspecified: Secondary | ICD-10-CM | POA: Diagnosis not present

## 2022-08-17 DIAGNOSIS — E440 Moderate protein-calorie malnutrition: Secondary | ICD-10-CM | POA: Diagnosis not present

## 2022-08-17 DIAGNOSIS — Z992 Dependence on renal dialysis: Secondary | ICD-10-CM | POA: Diagnosis not present

## 2022-08-17 DIAGNOSIS — N2589 Other disorders resulting from impaired renal tubular function: Secondary | ICD-10-CM | POA: Diagnosis not present

## 2022-08-17 DIAGNOSIS — E1129 Type 2 diabetes mellitus with other diabetic kidney complication: Secondary | ICD-10-CM | POA: Diagnosis not present

## 2022-08-17 DIAGNOSIS — R82998 Other abnormal findings in urine: Secondary | ICD-10-CM | POA: Diagnosis not present

## 2022-08-18 DIAGNOSIS — E1129 Type 2 diabetes mellitus with other diabetic kidney complication: Secondary | ICD-10-CM | POA: Diagnosis not present

## 2022-08-18 DIAGNOSIS — K769 Liver disease, unspecified: Secondary | ICD-10-CM | POA: Diagnosis not present

## 2022-08-18 DIAGNOSIS — R82998 Other abnormal findings in urine: Secondary | ICD-10-CM | POA: Diagnosis not present

## 2022-08-18 DIAGNOSIS — E440 Moderate protein-calorie malnutrition: Secondary | ICD-10-CM | POA: Diagnosis not present

## 2022-08-18 DIAGNOSIS — D509 Iron deficiency anemia, unspecified: Secondary | ICD-10-CM | POA: Diagnosis not present

## 2022-08-18 DIAGNOSIS — N2581 Secondary hyperparathyroidism of renal origin: Secondary | ICD-10-CM | POA: Diagnosis not present

## 2022-08-18 DIAGNOSIS — Z992 Dependence on renal dialysis: Secondary | ICD-10-CM | POA: Diagnosis not present

## 2022-08-18 DIAGNOSIS — N2589 Other disorders resulting from impaired renal tubular function: Secondary | ICD-10-CM | POA: Diagnosis not present

## 2022-08-18 DIAGNOSIS — D631 Anemia in chronic kidney disease: Secondary | ICD-10-CM | POA: Diagnosis not present

## 2022-08-18 DIAGNOSIS — N186 End stage renal disease: Secondary | ICD-10-CM | POA: Diagnosis not present

## 2022-08-19 DIAGNOSIS — E440 Moderate protein-calorie malnutrition: Secondary | ICD-10-CM | POA: Diagnosis not present

## 2022-08-19 DIAGNOSIS — R82998 Other abnormal findings in urine: Secondary | ICD-10-CM | POA: Diagnosis not present

## 2022-08-19 DIAGNOSIS — E1129 Type 2 diabetes mellitus with other diabetic kidney complication: Secondary | ICD-10-CM | POA: Diagnosis not present

## 2022-08-19 DIAGNOSIS — K769 Liver disease, unspecified: Secondary | ICD-10-CM | POA: Diagnosis not present

## 2022-08-19 DIAGNOSIS — N2589 Other disorders resulting from impaired renal tubular function: Secondary | ICD-10-CM | POA: Diagnosis not present

## 2022-08-19 DIAGNOSIS — N186 End stage renal disease: Secondary | ICD-10-CM | POA: Diagnosis not present

## 2022-08-19 DIAGNOSIS — D631 Anemia in chronic kidney disease: Secondary | ICD-10-CM | POA: Diagnosis not present

## 2022-08-19 DIAGNOSIS — D509 Iron deficiency anemia, unspecified: Secondary | ICD-10-CM | POA: Diagnosis not present

## 2022-08-19 DIAGNOSIS — N2581 Secondary hyperparathyroidism of renal origin: Secondary | ICD-10-CM | POA: Diagnosis not present

## 2022-08-19 DIAGNOSIS — Z992 Dependence on renal dialysis: Secondary | ICD-10-CM | POA: Diagnosis not present

## 2022-08-20 DIAGNOSIS — E1129 Type 2 diabetes mellitus with other diabetic kidney complication: Secondary | ICD-10-CM | POA: Diagnosis not present

## 2022-08-20 DIAGNOSIS — N2581 Secondary hyperparathyroidism of renal origin: Secondary | ICD-10-CM | POA: Diagnosis not present

## 2022-08-20 DIAGNOSIS — D509 Iron deficiency anemia, unspecified: Secondary | ICD-10-CM | POA: Diagnosis not present

## 2022-08-20 DIAGNOSIS — K769 Liver disease, unspecified: Secondary | ICD-10-CM | POA: Diagnosis not present

## 2022-08-20 DIAGNOSIS — R82998 Other abnormal findings in urine: Secondary | ICD-10-CM | POA: Diagnosis not present

## 2022-08-20 DIAGNOSIS — Z992 Dependence on renal dialysis: Secondary | ICD-10-CM | POA: Diagnosis not present

## 2022-08-20 DIAGNOSIS — D631 Anemia in chronic kidney disease: Secondary | ICD-10-CM | POA: Diagnosis not present

## 2022-08-20 DIAGNOSIS — N2589 Other disorders resulting from impaired renal tubular function: Secondary | ICD-10-CM | POA: Diagnosis not present

## 2022-08-20 DIAGNOSIS — E440 Moderate protein-calorie malnutrition: Secondary | ICD-10-CM | POA: Diagnosis not present

## 2022-08-20 DIAGNOSIS — N186 End stage renal disease: Secondary | ICD-10-CM | POA: Diagnosis not present

## 2022-08-21 DIAGNOSIS — R82998 Other abnormal findings in urine: Secondary | ICD-10-CM | POA: Diagnosis not present

## 2022-08-21 DIAGNOSIS — K769 Liver disease, unspecified: Secondary | ICD-10-CM | POA: Diagnosis not present

## 2022-08-21 DIAGNOSIS — E440 Moderate protein-calorie malnutrition: Secondary | ICD-10-CM | POA: Diagnosis not present

## 2022-08-21 DIAGNOSIS — D631 Anemia in chronic kidney disease: Secondary | ICD-10-CM | POA: Diagnosis not present

## 2022-08-21 DIAGNOSIS — N186 End stage renal disease: Secondary | ICD-10-CM | POA: Diagnosis not present

## 2022-08-21 DIAGNOSIS — Z992 Dependence on renal dialysis: Secondary | ICD-10-CM | POA: Diagnosis not present

## 2022-08-21 DIAGNOSIS — N2589 Other disorders resulting from impaired renal tubular function: Secondary | ICD-10-CM | POA: Diagnosis not present

## 2022-08-21 DIAGNOSIS — E1129 Type 2 diabetes mellitus with other diabetic kidney complication: Secondary | ICD-10-CM | POA: Diagnosis not present

## 2022-08-21 DIAGNOSIS — D509 Iron deficiency anemia, unspecified: Secondary | ICD-10-CM | POA: Diagnosis not present

## 2022-08-21 DIAGNOSIS — N2581 Secondary hyperparathyroidism of renal origin: Secondary | ICD-10-CM | POA: Diagnosis not present

## 2022-08-22 ENCOUNTER — Ambulatory Visit: Payer: Self-pay

## 2022-08-22 DIAGNOSIS — N2589 Other disorders resulting from impaired renal tubular function: Secondary | ICD-10-CM | POA: Diagnosis not present

## 2022-08-22 DIAGNOSIS — N2581 Secondary hyperparathyroidism of renal origin: Secondary | ICD-10-CM | POA: Diagnosis not present

## 2022-08-22 DIAGNOSIS — E440 Moderate protein-calorie malnutrition: Secondary | ICD-10-CM | POA: Diagnosis not present

## 2022-08-22 DIAGNOSIS — Z992 Dependence on renal dialysis: Secondary | ICD-10-CM | POA: Diagnosis not present

## 2022-08-22 DIAGNOSIS — K769 Liver disease, unspecified: Secondary | ICD-10-CM | POA: Diagnosis not present

## 2022-08-22 DIAGNOSIS — D631 Anemia in chronic kidney disease: Secondary | ICD-10-CM | POA: Diagnosis not present

## 2022-08-22 DIAGNOSIS — R82998 Other abnormal findings in urine: Secondary | ICD-10-CM | POA: Diagnosis not present

## 2022-08-22 DIAGNOSIS — N186 End stage renal disease: Secondary | ICD-10-CM | POA: Diagnosis not present

## 2022-08-22 DIAGNOSIS — D509 Iron deficiency anemia, unspecified: Secondary | ICD-10-CM | POA: Diagnosis not present

## 2022-08-22 DIAGNOSIS — E1129 Type 2 diabetes mellitus with other diabetic kidney complication: Secondary | ICD-10-CM | POA: Diagnosis not present

## 2022-08-22 NOTE — Patient Instructions (Signed)
Visit Information  Thank you for taking time to visit with me today. Please don't hesitate to contact me if I can be of assistance to you.   Following are the goals we discussed today:   Goals Addressed             This Visit's Progress    COMPLETED: Care Coordination Activities       Care Coordination Interventions: SDoH screening performed - no acute resource challenges identified at this time Determined the patient does not have concerns with medication costs and/or adherence Education provided on the role of the care coordination team - no follow up needed at this time Instructed the patient to contact her primary care provider as needed        Please call the care guide team at [phone] if you need to schedule an appointment with our care coordination team.  If you are experiencing a Mental Health or [place] or need someone to talk to, please call 1-800-273-TALK (toll free, 24 hour hotline)  Patient verbalizes understanding of instructions and care plan provided today and agrees to view in [place]. Active MyChart status and patient understanding of how to access instructions and care plan via MyChart confirmed with patient.     No further follow up required: Please contact your primary care provider as needed.  Daneen Schick, BSW, CDP Social Worker, Certified Dementia Practitioner [place] [facility]  Care Coordination [phone]

## 2022-08-22 NOTE — Patient Outreach (Signed)
  Care Coordination   Initial Visit Note   08/22/2022 Name: Noma Quijas MRN: [id number] DOB: 07/09/1946  Latrenda Irani is a 76 y.o. year old female who sees Isaac Bliss, Rayford Halsted, MD for primary care. I spoke with  [name] by phone today.  What matters to the patients health and wellness today?  Doing well, no concerns at this time.    Goals Addressed             This Visit's Progress    COMPLETED: Care Coordination Activities       Care Coordination Interventions: SDoH screening performed - no acute resource challenges identified at this time Determined the patient does not have concerns with medication costs and/or adherence Education provided on the role of the care coordination team - no follow up needed at this time Instructed the patient to contact her primary care provider as needed        SDOH assessments and interventions completed:  Yes  SDOH Interventions Today    Flowsheet Row Most Recent Value  SDOH Interventions   Food Insecurity Interventions Intervention Not Indicated  Housing Interventions Intervention Not Indicated  Transportation Interventions Intervention Not Indicated  Utilities Interventions Intervention Not Indicated  Financial Strain Interventions Intervention Not Indicated        Care Coordination Interventions Activated:  Yes  Care Coordination Interventions:  Yes, provided   Follow up plan: No further intervention required.   Encounter Outcome:  Pt. Visit Completed   Daneen Schick, BSW, CDP Social Worker, Certified Dementia Practitioner [place] [facility]  Care Coordination [phone]

## 2022-08-23 DIAGNOSIS — Z992 Dependence on renal dialysis: Secondary | ICD-10-CM | POA: Diagnosis not present

## 2022-08-23 DIAGNOSIS — N2581 Secondary hyperparathyroidism of renal origin: Secondary | ICD-10-CM | POA: Diagnosis not present

## 2022-08-23 DIAGNOSIS — D631 Anemia in chronic kidney disease: Secondary | ICD-10-CM | POA: Diagnosis not present

## 2022-08-23 DIAGNOSIS — N2589 Other disorders resulting from impaired renal tubular function: Secondary | ICD-10-CM | POA: Diagnosis not present

## 2022-08-23 DIAGNOSIS — D509 Iron deficiency anemia, unspecified: Secondary | ICD-10-CM | POA: Diagnosis not present

## 2022-08-23 DIAGNOSIS — E440 Moderate protein-calorie malnutrition: Secondary | ICD-10-CM | POA: Diagnosis not present

## 2022-08-23 DIAGNOSIS — N186 End stage renal disease: Secondary | ICD-10-CM | POA: Diagnosis not present

## 2022-08-23 DIAGNOSIS — K769 Liver disease, unspecified: Secondary | ICD-10-CM | POA: Diagnosis not present

## 2022-08-23 DIAGNOSIS — E1129 Type 2 diabetes mellitus with other diabetic kidney complication: Secondary | ICD-10-CM | POA: Diagnosis not present

## 2022-08-23 DIAGNOSIS — R82998 Other abnormal findings in urine: Secondary | ICD-10-CM | POA: Diagnosis not present

## 2022-08-24 DIAGNOSIS — N2581 Secondary hyperparathyroidism of renal origin: Secondary | ICD-10-CM | POA: Diagnosis not present

## 2022-08-24 DIAGNOSIS — D631 Anemia in chronic kidney disease: Secondary | ICD-10-CM | POA: Diagnosis not present

## 2022-08-24 DIAGNOSIS — N186 End stage renal disease: Secondary | ICD-10-CM | POA: Diagnosis not present

## 2022-08-24 DIAGNOSIS — R82998 Other abnormal findings in urine: Secondary | ICD-10-CM | POA: Diagnosis not present

## 2022-08-24 DIAGNOSIS — Z992 Dependence on renal dialysis: Secondary | ICD-10-CM | POA: Diagnosis not present

## 2022-08-24 DIAGNOSIS — D509 Iron deficiency anemia, unspecified: Secondary | ICD-10-CM | POA: Diagnosis not present

## 2022-08-24 DIAGNOSIS — E440 Moderate protein-calorie malnutrition: Secondary | ICD-10-CM | POA: Diagnosis not present

## 2022-08-24 DIAGNOSIS — K769 Liver disease, unspecified: Secondary | ICD-10-CM | POA: Diagnosis not present

## 2022-08-24 DIAGNOSIS — E1129 Type 2 diabetes mellitus with other diabetic kidney complication: Secondary | ICD-10-CM | POA: Diagnosis not present

## 2022-08-24 DIAGNOSIS — N2589 Other disorders resulting from impaired renal tubular function: Secondary | ICD-10-CM | POA: Diagnosis not present

## 2022-08-25 DIAGNOSIS — N186 End stage renal disease: Secondary | ICD-10-CM | POA: Diagnosis not present

## 2022-08-25 DIAGNOSIS — E1129 Type 2 diabetes mellitus with other diabetic kidney complication: Secondary | ICD-10-CM | POA: Diagnosis not present

## 2022-08-25 DIAGNOSIS — D631 Anemia in chronic kidney disease: Secondary | ICD-10-CM | POA: Diagnosis not present

## 2022-08-25 DIAGNOSIS — R82998 Other abnormal findings in urine: Secondary | ICD-10-CM | POA: Diagnosis not present

## 2022-08-25 DIAGNOSIS — Z992 Dependence on renal dialysis: Secondary | ICD-10-CM | POA: Diagnosis not present

## 2022-08-25 DIAGNOSIS — D509 Iron deficiency anemia, unspecified: Secondary | ICD-10-CM | POA: Diagnosis not present

## 2022-08-25 DIAGNOSIS — K769 Liver disease, unspecified: Secondary | ICD-10-CM | POA: Diagnosis not present

## 2022-08-25 DIAGNOSIS — N2589 Other disorders resulting from impaired renal tubular function: Secondary | ICD-10-CM | POA: Diagnosis not present

## 2022-08-25 DIAGNOSIS — N2581 Secondary hyperparathyroidism of renal origin: Secondary | ICD-10-CM | POA: Diagnosis not present

## 2022-08-25 DIAGNOSIS — E440 Moderate protein-calorie malnutrition: Secondary | ICD-10-CM | POA: Diagnosis not present

## 2022-08-26 ENCOUNTER — Encounter: Payer: Self-pay | Admitting: *Deleted

## 2022-08-26 ENCOUNTER — Telehealth: Payer: Self-pay | Admitting: *Deleted

## 2022-08-26 DIAGNOSIS — R82998 Other abnormal findings in urine: Secondary | ICD-10-CM | POA: Diagnosis not present

## 2022-08-26 DIAGNOSIS — E1129 Type 2 diabetes mellitus with other diabetic kidney complication: Secondary | ICD-10-CM | POA: Diagnosis not present

## 2022-08-26 DIAGNOSIS — D509 Iron deficiency anemia, unspecified: Secondary | ICD-10-CM | POA: Diagnosis not present

## 2022-08-26 DIAGNOSIS — K769 Liver disease, unspecified: Secondary | ICD-10-CM | POA: Diagnosis not present

## 2022-08-26 DIAGNOSIS — D631 Anemia in chronic kidney disease: Secondary | ICD-10-CM | POA: Diagnosis not present

## 2022-08-26 DIAGNOSIS — N2589 Other disorders resulting from impaired renal tubular function: Secondary | ICD-10-CM | POA: Diagnosis not present

## 2022-08-26 DIAGNOSIS — N2581 Secondary hyperparathyroidism of renal origin: Secondary | ICD-10-CM | POA: Diagnosis not present

## 2022-08-26 DIAGNOSIS — Z992 Dependence on renal dialysis: Secondary | ICD-10-CM | POA: Diagnosis not present

## 2022-08-26 DIAGNOSIS — E440 Moderate protein-calorie malnutrition: Secondary | ICD-10-CM | POA: Diagnosis not present

## 2022-08-26 DIAGNOSIS — N186 End stage renal disease: Secondary | ICD-10-CM | POA: Diagnosis not present

## 2022-08-26 NOTE — Patient Instructions (Signed)
Visit Information  Thank you for taking time to visit with me today. Please don't hesitate to contact me if I can be of assistance to you.   Following are the goals we discussed today:   Goals Addressed               This Visit's Progress     Hypertension Management (pt-stated)        Care Coordination Interventions: Evaluation of current treatment plan related to hypertension self management and patient's adherence to plan as established by provider Provided education to patient re: stroke prevention, s/s of heart attack and stroke Reviewed medications with patient and discussed importance of compliance Counseled on adverse effects of illicit drug and excessive alcohol use in patients with high blood pressure  Counseled on the importance of exercise goals with target of 150 minutes per week Discussed plans with patient for ongoing care management follow up and provided patient with direct contact information for care management team Advised patient, providing education and rationale, to monitor blood pressure daily and record, calling PCP for findings outside established parameters Reviewed scheduled/upcoming provider appointments including:  Provided education on prescribed diet DASH diet Discussed complications of poorly controlled blood pressure such as heart disease, stroke, circulatory complications, vision complications, kidney impairment, sexual dysfunction Screening for signs and symptoms of depression related to chronic disease state  Assessed social determinant of health barriers Referral to LCSW for counseling until a psychiatrist can be established.  Notify PCP on pt's request to be referred to a psychiatrist due to lost of family member and a chain of events that have occurred that affects pt's ongoing health. Pt also advised to contact her provider's office and scheduled an AWV for this year. Will attach HTN information to pt's MyChart for ongoing management of care.         Our next appointment is by telephone on 09/23/2022 at 2:00 PM  Please call the care guide team at [phone] if you need to cancel or reschedule your appointment.   If you are experiencing a Mental Health or [place] or need someone to talk to, please call the Suicide and Crisis Lifeline: 988  Patient verbalizes understanding of instructions and care plan provided today and agrees to view in [place]. Active MyChart status and patient understanding of how to access instructions and care plan via MyChart confirmed with patient.     The patient has been provided with contact information for the care management team and has been advised to call with any health related questions or concerns.    Raina Mina, RN Care Management Coordinator [place] Office [phone]

## 2022-08-26 NOTE — Patient Outreach (Signed)
  Care Coordination   Initial Visit Note   08/26/2022 Name: Felicia Thompson MRN: [id number] DOB: 11-16-1946  Felicia Thompson is a 76 y.o. year old female who sees Isaac Bliss, Rayford Halsted, MD for primary care. I spoke with  [name] by phone today.  What matters to the patients health and wellness today?  HTN Management    Goals Addressed               This Visit's Progress     Hypertension Management (pt-stated)        Care Coordination Interventions: Evaluation of current treatment plan related to hypertension self management and patient's adherence to plan as established by provider Provided education to patient re: stroke prevention, s/s of heart attack and stroke Reviewed medications with patient and discussed importance of compliance Counseled on adverse effects of illicit drug and excessive alcohol use in patients with high blood pressure  Counseled on the importance of exercise goals with target of 150 minutes per week Discussed plans with patient for ongoing care management follow up and provided patient with direct contact information for care management team Advised patient, providing education and rationale, to monitor blood pressure daily and record, calling PCP for findings outside established parameters Reviewed scheduled/upcoming provider appointments including:  Provided education on prescribed diet DASH diet Discussed complications of poorly controlled blood pressure such as heart disease, stroke, circulatory complications, vision complications, kidney impairment, sexual dysfunction Screening for signs and symptoms of depression related to chronic disease state  Assessed social determinant of health barriers Referral to LCSW for counseling until a psychiatrist can be established.  Notify PCP on pt's request to be referred to a psychiatrist due to lost of family member and a chain of events that have occurred that affects pt's ongoing  health. Pt also advised to contact her provider's office and scheduled an AWV for this year. Will attach HTN information to pt's MyChart for ongoing management of care.        SDOH assessments and interventions completed:  Yes  SDOH Interventions Today    Flowsheet Row Most Recent Value  SDOH Interventions   Food Insecurity Interventions Intervention Not Indicated  Housing Interventions Intervention Not Indicated  Transportation Interventions Intervention Not Indicated  Utilities Interventions Intervention Not Indicated        Care Coordination Interventions Activated:  Yes  Care Coordination Interventions:  Yes, provided   Follow up plan: Follow up call scheduled for 09/23/2022 @ 2:00 PM    Encounter Outcome:  Pt. Visit Completed   Raina Mina, RN Care Management Coordinator [place] Office [phone]

## 2022-08-27 ENCOUNTER — Ambulatory Visit: Payer: Medicare Other | Admitting: Adult Health

## 2022-08-27 DIAGNOSIS — D631 Anemia in chronic kidney disease: Secondary | ICD-10-CM | POA: Diagnosis not present

## 2022-08-27 DIAGNOSIS — D509 Iron deficiency anemia, unspecified: Secondary | ICD-10-CM | POA: Diagnosis not present

## 2022-08-27 DIAGNOSIS — Z992 Dependence on renal dialysis: Secondary | ICD-10-CM | POA: Diagnosis not present

## 2022-08-27 DIAGNOSIS — N2589 Other disorders resulting from impaired renal tubular function: Secondary | ICD-10-CM | POA: Diagnosis not present

## 2022-08-27 DIAGNOSIS — N186 End stage renal disease: Secondary | ICD-10-CM | POA: Diagnosis not present

## 2022-08-27 DIAGNOSIS — E1129 Type 2 diabetes mellitus with other diabetic kidney complication: Secondary | ICD-10-CM | POA: Diagnosis not present

## 2022-08-27 DIAGNOSIS — K769 Liver disease, unspecified: Secondary | ICD-10-CM | POA: Diagnosis not present

## 2022-08-27 DIAGNOSIS — N2581 Secondary hyperparathyroidism of renal origin: Secondary | ICD-10-CM | POA: Diagnosis not present

## 2022-08-27 DIAGNOSIS — E440 Moderate protein-calorie malnutrition: Secondary | ICD-10-CM | POA: Diagnosis not present

## 2022-08-27 DIAGNOSIS — R82998 Other abnormal findings in urine: Secondary | ICD-10-CM | POA: Diagnosis not present

## 2022-08-28 DIAGNOSIS — E1129 Type 2 diabetes mellitus with other diabetic kidney complication: Secondary | ICD-10-CM | POA: Diagnosis not present

## 2022-08-28 DIAGNOSIS — N186 End stage renal disease: Secondary | ICD-10-CM | POA: Diagnosis not present

## 2022-08-28 DIAGNOSIS — K769 Liver disease, unspecified: Secondary | ICD-10-CM | POA: Diagnosis not present

## 2022-08-28 DIAGNOSIS — Z992 Dependence on renal dialysis: Secondary | ICD-10-CM | POA: Diagnosis not present

## 2022-08-28 DIAGNOSIS — E440 Moderate protein-calorie malnutrition: Secondary | ICD-10-CM | POA: Diagnosis not present

## 2022-08-28 DIAGNOSIS — N2581 Secondary hyperparathyroidism of renal origin: Secondary | ICD-10-CM | POA: Diagnosis not present

## 2022-08-28 DIAGNOSIS — D631 Anemia in chronic kidney disease: Secondary | ICD-10-CM | POA: Diagnosis not present

## 2022-08-28 DIAGNOSIS — D509 Iron deficiency anemia, unspecified: Secondary | ICD-10-CM | POA: Diagnosis not present

## 2022-08-28 DIAGNOSIS — R82998 Other abnormal findings in urine: Secondary | ICD-10-CM | POA: Diagnosis not present

## 2022-08-28 DIAGNOSIS — N2589 Other disorders resulting from impaired renal tubular function: Secondary | ICD-10-CM | POA: Diagnosis not present

## 2022-08-29 DIAGNOSIS — Z992 Dependence on renal dialysis: Secondary | ICD-10-CM | POA: Diagnosis not present

## 2022-08-29 DIAGNOSIS — R82998 Other abnormal findings in urine: Secondary | ICD-10-CM | POA: Diagnosis not present

## 2022-08-29 DIAGNOSIS — E440 Moderate protein-calorie malnutrition: Secondary | ICD-10-CM | POA: Diagnosis not present

## 2022-08-29 DIAGNOSIS — N2581 Secondary hyperparathyroidism of renal origin: Secondary | ICD-10-CM | POA: Diagnosis not present

## 2022-08-29 DIAGNOSIS — E1129 Type 2 diabetes mellitus with other diabetic kidney complication: Secondary | ICD-10-CM | POA: Diagnosis not present

## 2022-08-29 DIAGNOSIS — K769 Liver disease, unspecified: Secondary | ICD-10-CM | POA: Diagnosis not present

## 2022-08-29 DIAGNOSIS — D631 Anemia in chronic kidney disease: Secondary | ICD-10-CM | POA: Diagnosis not present

## 2022-08-29 DIAGNOSIS — N2589 Other disorders resulting from impaired renal tubular function: Secondary | ICD-10-CM | POA: Diagnosis not present

## 2022-08-29 DIAGNOSIS — N186 End stage renal disease: Secondary | ICD-10-CM | POA: Diagnosis not present

## 2022-08-29 DIAGNOSIS — D509 Iron deficiency anemia, unspecified: Secondary | ICD-10-CM | POA: Diagnosis not present

## 2022-08-30 DIAGNOSIS — D509 Iron deficiency anemia, unspecified: Secondary | ICD-10-CM | POA: Diagnosis not present

## 2022-08-30 DIAGNOSIS — D631 Anemia in chronic kidney disease: Secondary | ICD-10-CM | POA: Diagnosis not present

## 2022-08-30 DIAGNOSIS — K769 Liver disease, unspecified: Secondary | ICD-10-CM | POA: Diagnosis not present

## 2022-08-30 DIAGNOSIS — N2589 Other disorders resulting from impaired renal tubular function: Secondary | ICD-10-CM | POA: Diagnosis not present

## 2022-08-30 DIAGNOSIS — N2581 Secondary hyperparathyroidism of renal origin: Secondary | ICD-10-CM | POA: Diagnosis not present

## 2022-08-30 DIAGNOSIS — Z992 Dependence on renal dialysis: Secondary | ICD-10-CM | POA: Diagnosis not present

## 2022-08-30 DIAGNOSIS — E1129 Type 2 diabetes mellitus with other diabetic kidney complication: Secondary | ICD-10-CM | POA: Diagnosis not present

## 2022-08-30 DIAGNOSIS — N186 End stage renal disease: Secondary | ICD-10-CM | POA: Diagnosis not present

## 2022-08-30 DIAGNOSIS — R82998 Other abnormal findings in urine: Secondary | ICD-10-CM | POA: Diagnosis not present

## 2022-08-30 DIAGNOSIS — E440 Moderate protein-calorie malnutrition: Secondary | ICD-10-CM | POA: Diagnosis not present

## 2022-08-31 DIAGNOSIS — Z992 Dependence on renal dialysis: Secondary | ICD-10-CM | POA: Diagnosis not present

## 2022-08-31 DIAGNOSIS — N2581 Secondary hyperparathyroidism of renal origin: Secondary | ICD-10-CM | POA: Diagnosis not present

## 2022-08-31 DIAGNOSIS — N186 End stage renal disease: Secondary | ICD-10-CM | POA: Diagnosis not present

## 2022-08-31 DIAGNOSIS — K769 Liver disease, unspecified: Secondary | ICD-10-CM | POA: Diagnosis not present

## 2022-08-31 DIAGNOSIS — D509 Iron deficiency anemia, unspecified: Secondary | ICD-10-CM | POA: Diagnosis not present

## 2022-08-31 DIAGNOSIS — D631 Anemia in chronic kidney disease: Secondary | ICD-10-CM | POA: Diagnosis not present

## 2022-08-31 DIAGNOSIS — R82998 Other abnormal findings in urine: Secondary | ICD-10-CM | POA: Diagnosis not present

## 2022-08-31 DIAGNOSIS — N2589 Other disorders resulting from impaired renal tubular function: Secondary | ICD-10-CM | POA: Diagnosis not present

## 2022-08-31 DIAGNOSIS — E1129 Type 2 diabetes mellitus with other diabetic kidney complication: Secondary | ICD-10-CM | POA: Diagnosis not present

## 2022-08-31 DIAGNOSIS — E440 Moderate protein-calorie malnutrition: Secondary | ICD-10-CM | POA: Diagnosis not present

## 2022-09-01 DIAGNOSIS — E875 Hyperkalemia: Secondary | ICD-10-CM | POA: Diagnosis not present

## 2022-09-01 DIAGNOSIS — R82998 Other abnormal findings in urine: Secondary | ICD-10-CM | POA: Diagnosis not present

## 2022-09-01 DIAGNOSIS — N186 End stage renal disease: Secondary | ICD-10-CM | POA: Diagnosis not present

## 2022-09-01 DIAGNOSIS — D631 Anemia in chronic kidney disease: Secondary | ICD-10-CM | POA: Diagnosis not present

## 2022-09-01 DIAGNOSIS — D509 Iron deficiency anemia, unspecified: Secondary | ICD-10-CM | POA: Diagnosis not present

## 2022-09-01 DIAGNOSIS — K769 Liver disease, unspecified: Secondary | ICD-10-CM | POA: Diagnosis not present

## 2022-09-01 DIAGNOSIS — Z992 Dependence on renal dialysis: Secondary | ICD-10-CM | POA: Diagnosis not present

## 2022-09-01 DIAGNOSIS — N2581 Secondary hyperparathyroidism of renal origin: Secondary | ICD-10-CM | POA: Diagnosis not present

## 2022-09-01 DIAGNOSIS — Z79899 Other long term (current) drug therapy: Secondary | ICD-10-CM | POA: Diagnosis not present

## 2022-09-02 DIAGNOSIS — K769 Liver disease, unspecified: Secondary | ICD-10-CM | POA: Diagnosis not present

## 2022-09-02 DIAGNOSIS — N2581 Secondary hyperparathyroidism of renal origin: Secondary | ICD-10-CM | POA: Diagnosis not present

## 2022-09-02 DIAGNOSIS — D631 Anemia in chronic kidney disease: Secondary | ICD-10-CM | POA: Diagnosis not present

## 2022-09-02 DIAGNOSIS — E875 Hyperkalemia: Secondary | ICD-10-CM | POA: Diagnosis not present

## 2022-09-02 DIAGNOSIS — Z79899 Other long term (current) drug therapy: Secondary | ICD-10-CM | POA: Diagnosis not present

## 2022-09-02 DIAGNOSIS — Z992 Dependence on renal dialysis: Secondary | ICD-10-CM | POA: Diagnosis not present

## 2022-09-02 DIAGNOSIS — D509 Iron deficiency anemia, unspecified: Secondary | ICD-10-CM | POA: Diagnosis not present

## 2022-09-02 DIAGNOSIS — R82998 Other abnormal findings in urine: Secondary | ICD-10-CM | POA: Diagnosis not present

## 2022-09-02 DIAGNOSIS — N186 End stage renal disease: Secondary | ICD-10-CM | POA: Diagnosis not present

## 2022-09-03 ENCOUNTER — Encounter: Payer: Medicare Other | Admitting: *Deleted

## 2022-09-03 ENCOUNTER — Ambulatory Visit: Payer: Self-pay | Admitting: Licensed Clinical Social Worker

## 2022-09-03 ENCOUNTER — Encounter: Payer: Self-pay | Admitting: Licensed Clinical Social Worker

## 2022-09-03 DIAGNOSIS — R82998 Other abnormal findings in urine: Secondary | ICD-10-CM | POA: Diagnosis not present

## 2022-09-03 DIAGNOSIS — D509 Iron deficiency anemia, unspecified: Secondary | ICD-10-CM | POA: Diagnosis not present

## 2022-09-03 DIAGNOSIS — E875 Hyperkalemia: Secondary | ICD-10-CM | POA: Diagnosis not present

## 2022-09-03 DIAGNOSIS — N186 End stage renal disease: Secondary | ICD-10-CM | POA: Diagnosis not present

## 2022-09-03 DIAGNOSIS — Z79899 Other long term (current) drug therapy: Secondary | ICD-10-CM | POA: Diagnosis not present

## 2022-09-03 DIAGNOSIS — Z992 Dependence on renal dialysis: Secondary | ICD-10-CM | POA: Diagnosis not present

## 2022-09-03 DIAGNOSIS — D631 Anemia in chronic kidney disease: Secondary | ICD-10-CM | POA: Diagnosis not present

## 2022-09-03 DIAGNOSIS — N2581 Secondary hyperparathyroidism of renal origin: Secondary | ICD-10-CM | POA: Diagnosis not present

## 2022-09-03 DIAGNOSIS — K769 Liver disease, unspecified: Secondary | ICD-10-CM | POA: Diagnosis not present

## 2022-09-04 DIAGNOSIS — N186 End stage renal disease: Secondary | ICD-10-CM | POA: Diagnosis not present

## 2022-09-04 DIAGNOSIS — R82998 Other abnormal findings in urine: Secondary | ICD-10-CM | POA: Diagnosis not present

## 2022-09-04 DIAGNOSIS — E875 Hyperkalemia: Secondary | ICD-10-CM | POA: Diagnosis not present

## 2022-09-04 DIAGNOSIS — N2581 Secondary hyperparathyroidism of renal origin: Secondary | ICD-10-CM | POA: Diagnosis not present

## 2022-09-04 DIAGNOSIS — K769 Liver disease, unspecified: Secondary | ICD-10-CM | POA: Diagnosis not present

## 2022-09-04 DIAGNOSIS — Z79899 Other long term (current) drug therapy: Secondary | ICD-10-CM | POA: Diagnosis not present

## 2022-09-04 DIAGNOSIS — D631 Anemia in chronic kidney disease: Secondary | ICD-10-CM | POA: Diagnosis not present

## 2022-09-04 DIAGNOSIS — Z992 Dependence on renal dialysis: Secondary | ICD-10-CM | POA: Diagnosis not present

## 2022-09-04 DIAGNOSIS — D509 Iron deficiency anemia, unspecified: Secondary | ICD-10-CM | POA: Diagnosis not present

## 2022-09-05 DIAGNOSIS — R82998 Other abnormal findings in urine: Secondary | ICD-10-CM | POA: Diagnosis not present

## 2022-09-05 DIAGNOSIS — Z79899 Other long term (current) drug therapy: Secondary | ICD-10-CM | POA: Diagnosis not present

## 2022-09-05 DIAGNOSIS — N186 End stage renal disease: Secondary | ICD-10-CM | POA: Diagnosis not present

## 2022-09-05 DIAGNOSIS — N2581 Secondary hyperparathyroidism of renal origin: Secondary | ICD-10-CM | POA: Diagnosis not present

## 2022-09-05 DIAGNOSIS — E875 Hyperkalemia: Secondary | ICD-10-CM | POA: Diagnosis not present

## 2022-09-05 DIAGNOSIS — K769 Liver disease, unspecified: Secondary | ICD-10-CM | POA: Diagnosis not present

## 2022-09-05 DIAGNOSIS — Z992 Dependence on renal dialysis: Secondary | ICD-10-CM | POA: Diagnosis not present

## 2022-09-05 DIAGNOSIS — D631 Anemia in chronic kidney disease: Secondary | ICD-10-CM | POA: Diagnosis not present

## 2022-09-05 DIAGNOSIS — D509 Iron deficiency anemia, unspecified: Secondary | ICD-10-CM | POA: Diagnosis not present

## 2022-09-06 DIAGNOSIS — N186 End stage renal disease: Secondary | ICD-10-CM | POA: Diagnosis not present

## 2022-09-06 DIAGNOSIS — Z992 Dependence on renal dialysis: Secondary | ICD-10-CM | POA: Diagnosis not present

## 2022-09-06 DIAGNOSIS — D631 Anemia in chronic kidney disease: Secondary | ICD-10-CM | POA: Diagnosis not present

## 2022-09-06 DIAGNOSIS — D509 Iron deficiency anemia, unspecified: Secondary | ICD-10-CM | POA: Diagnosis not present

## 2022-09-06 DIAGNOSIS — Z79899 Other long term (current) drug therapy: Secondary | ICD-10-CM | POA: Diagnosis not present

## 2022-09-06 DIAGNOSIS — K769 Liver disease, unspecified: Secondary | ICD-10-CM | POA: Diagnosis not present

## 2022-09-06 DIAGNOSIS — E875 Hyperkalemia: Secondary | ICD-10-CM | POA: Diagnosis not present

## 2022-09-06 DIAGNOSIS — N2581 Secondary hyperparathyroidism of renal origin: Secondary | ICD-10-CM | POA: Diagnosis not present

## 2022-09-06 DIAGNOSIS — R82998 Other abnormal findings in urine: Secondary | ICD-10-CM | POA: Diagnosis not present

## 2022-09-07 DIAGNOSIS — E875 Hyperkalemia: Secondary | ICD-10-CM | POA: Diagnosis not present

## 2022-09-07 DIAGNOSIS — Z79899 Other long term (current) drug therapy: Secondary | ICD-10-CM | POA: Diagnosis not present

## 2022-09-07 DIAGNOSIS — N186 End stage renal disease: Secondary | ICD-10-CM | POA: Diagnosis not present

## 2022-09-07 DIAGNOSIS — R82998 Other abnormal findings in urine: Secondary | ICD-10-CM | POA: Diagnosis not present

## 2022-09-07 DIAGNOSIS — D509 Iron deficiency anemia, unspecified: Secondary | ICD-10-CM | POA: Diagnosis not present

## 2022-09-07 DIAGNOSIS — Z992 Dependence on renal dialysis: Secondary | ICD-10-CM | POA: Diagnosis not present

## 2022-09-07 DIAGNOSIS — D631 Anemia in chronic kidney disease: Secondary | ICD-10-CM | POA: Diagnosis not present

## 2022-09-07 DIAGNOSIS — N2581 Secondary hyperparathyroidism of renal origin: Secondary | ICD-10-CM | POA: Diagnosis not present

## 2022-09-07 DIAGNOSIS — K769 Liver disease, unspecified: Secondary | ICD-10-CM | POA: Diagnosis not present

## 2022-09-08 DIAGNOSIS — N2581 Secondary hyperparathyroidism of renal origin: Secondary | ICD-10-CM | POA: Diagnosis not present

## 2022-09-08 DIAGNOSIS — Z992 Dependence on renal dialysis: Secondary | ICD-10-CM | POA: Diagnosis not present

## 2022-09-08 DIAGNOSIS — D509 Iron deficiency anemia, unspecified: Secondary | ICD-10-CM | POA: Diagnosis not present

## 2022-09-08 DIAGNOSIS — N186 End stage renal disease: Secondary | ICD-10-CM | POA: Diagnosis not present

## 2022-09-08 DIAGNOSIS — D631 Anemia in chronic kidney disease: Secondary | ICD-10-CM | POA: Diagnosis not present

## 2022-09-08 DIAGNOSIS — K769 Liver disease, unspecified: Secondary | ICD-10-CM | POA: Diagnosis not present

## 2022-09-08 DIAGNOSIS — Z79899 Other long term (current) drug therapy: Secondary | ICD-10-CM | POA: Diagnosis not present

## 2022-09-08 DIAGNOSIS — R82998 Other abnormal findings in urine: Secondary | ICD-10-CM | POA: Diagnosis not present

## 2022-09-08 DIAGNOSIS — E875 Hyperkalemia: Secondary | ICD-10-CM | POA: Diagnosis not present

## 2022-09-09 DIAGNOSIS — D631 Anemia in chronic kidney disease: Secondary | ICD-10-CM | POA: Diagnosis not present

## 2022-09-09 DIAGNOSIS — E875 Hyperkalemia: Secondary | ICD-10-CM | POA: Diagnosis not present

## 2022-09-09 DIAGNOSIS — Z79899 Other long term (current) drug therapy: Secondary | ICD-10-CM | POA: Diagnosis not present

## 2022-09-09 DIAGNOSIS — R82998 Other abnormal findings in urine: Secondary | ICD-10-CM | POA: Diagnosis not present

## 2022-09-09 DIAGNOSIS — N2581 Secondary hyperparathyroidism of renal origin: Secondary | ICD-10-CM | POA: Diagnosis not present

## 2022-09-09 DIAGNOSIS — K769 Liver disease, unspecified: Secondary | ICD-10-CM | POA: Diagnosis not present

## 2022-09-09 DIAGNOSIS — Z992 Dependence on renal dialysis: Secondary | ICD-10-CM | POA: Diagnosis not present

## 2022-09-09 DIAGNOSIS — D509 Iron deficiency anemia, unspecified: Secondary | ICD-10-CM | POA: Diagnosis not present

## 2022-09-09 DIAGNOSIS — N186 End stage renal disease: Secondary | ICD-10-CM | POA: Diagnosis not present

## 2022-09-10 DIAGNOSIS — E875 Hyperkalemia: Secondary | ICD-10-CM | POA: Diagnosis not present

## 2022-09-10 DIAGNOSIS — D509 Iron deficiency anemia, unspecified: Secondary | ICD-10-CM | POA: Diagnosis not present

## 2022-09-10 DIAGNOSIS — D631 Anemia in chronic kidney disease: Secondary | ICD-10-CM | POA: Diagnosis not present

## 2022-09-10 DIAGNOSIS — R82998 Other abnormal findings in urine: Secondary | ICD-10-CM | POA: Diagnosis not present

## 2022-09-10 DIAGNOSIS — N2581 Secondary hyperparathyroidism of renal origin: Secondary | ICD-10-CM | POA: Diagnosis not present

## 2022-09-10 DIAGNOSIS — K769 Liver disease, unspecified: Secondary | ICD-10-CM | POA: Diagnosis not present

## 2022-09-10 DIAGNOSIS — N186 End stage renal disease: Secondary | ICD-10-CM | POA: Diagnosis not present

## 2022-09-10 DIAGNOSIS — Z79899 Other long term (current) drug therapy: Secondary | ICD-10-CM | POA: Diagnosis not present

## 2022-09-10 DIAGNOSIS — Z992 Dependence on renal dialysis: Secondary | ICD-10-CM | POA: Diagnosis not present

## 2022-09-10 NOTE — Patient Instructions (Signed)
Visit Information  Thank you for taking time to visit with me today. Please don't hesitate to contact me if I can be of assistance to you.   Following are the goals we discussed today:   Goals Addressed             This Visit's Progress    LCSW Plan of Care-Obtain Supportive Resources   On track    Care Coordination Interventions: Mindfulness or Relaxation training provided Active listening / Reflection utilized  Emotional Support Provided Verbalization of feelings encouraged  Patient reports hx of extensive grief (career, loved ones, health, and support system) Pt would like to increase energy, participate in activities, and manage back pain Validation and Encouragement Provided Patient has limited support system locally  Patient endorses episodes of depression and low quality sleep. No appetite or irritability concerns Denies SI/HI Healthy coping skills identified to assist with coping with symptoms  Protective factors identified LCSW completed referral to BSW to discuss transportation options (ie ACTA, Medicare) Pt would like resource info mailed to residence         Our next appointment is by telephone on 10/01/22 at 1:00 PM  Please call the care guide team at [phone] if you need to cancel or reschedule your appointment.   If you are experiencing a Mental Health or [place] or need someone to talk to, please call the Suicide and Crisis Lifeline: 988 call 911   Patient verbalizes understanding of instructions and care plan provided today and agrees to view in [place]. Active MyChart status and patient understanding of how to access instructions and care plan via MyChart confirmed with patient.     Christa See, MSW, [email or web address] Phone [phone] 2:23 PM

## 2022-09-10 NOTE — Patient Outreach (Signed)
  Care Coordination   Initial Visit Note   09/10/2022 Name: Felicia Thompson MRN: [id number] DOB: [birth year]  Felicia Thompson is a 76 y.o. year old female who sees Isaac Bliss, Rayford Halsted, MD for primary care. I spoke with  [name] by phone today.  What matters to the patients health and wellness today?  Management of stress and transportation    Goals Addressed             This Visit's Progress    LCSW Plan of Care-Obtain Supportive Resources   On track    Care Coordination Interventions: Mindfulness or Relaxation training provided Active listening / Reflection utilized  Emotional Support Provided Verbalization of feelings encouraged  Patient reports hx of extensive grief (career, loved ones, health, and support system) Pt would like to increase energy, participate in activities, and manage back pain Validation and Encouragement Provided Patient has limited support system locally  Patient endorses episodes of depression and low quality sleep. No appetite or irritability concerns Denies SI/HI Healthy coping skills identified to assist with coping with symptoms  Protective factors identified LCSW completed referral to BSW to discuss transportation options (ie ACTA, Medicare) Pt would like resource info mailed to residence         SDOH assessments and interventions completed:  Yes  SDOH Interventions Today    Flowsheet Row Most Recent Value  SDOH Interventions   Transportation Interventions Other (Comment)  [THN BSW referral completed]        Care Coordination Interventions Activated:  Yes  Care Coordination Interventions:  Yes, provided   Follow up plan: Follow up call scheduled for 4 weeks    Encounter Outcome:  Pt. Visit Completed   Christa See, MSW, [email or web address] Phone [phone] 2:22 PM

## 2022-09-11 ENCOUNTER — Telehealth: Payer: Self-pay

## 2022-09-11 ENCOUNTER — Ambulatory Visit: Payer: Self-pay

## 2022-09-11 DIAGNOSIS — N186 End stage renal disease: Secondary | ICD-10-CM | POA: Diagnosis not present

## 2022-09-11 DIAGNOSIS — J189 Pneumonia, unspecified organism: Secondary | ICD-10-CM

## 2022-09-11 DIAGNOSIS — Z79899 Other long term (current) drug therapy: Secondary | ICD-10-CM | POA: Diagnosis not present

## 2022-09-11 DIAGNOSIS — K769 Liver disease, unspecified: Secondary | ICD-10-CM | POA: Diagnosis not present

## 2022-09-11 DIAGNOSIS — E875 Hyperkalemia: Secondary | ICD-10-CM | POA: Diagnosis not present

## 2022-09-11 DIAGNOSIS — D509 Iron deficiency anemia, unspecified: Secondary | ICD-10-CM | POA: Diagnosis not present

## 2022-09-11 DIAGNOSIS — J9601 Acute respiratory failure with hypoxia: Secondary | ICD-10-CM

## 2022-09-11 DIAGNOSIS — Z992 Dependence on renal dialysis: Secondary | ICD-10-CM | POA: Diagnosis not present

## 2022-09-11 DIAGNOSIS — D631 Anemia in chronic kidney disease: Secondary | ICD-10-CM | POA: Diagnosis not present

## 2022-09-11 DIAGNOSIS — R82998 Other abnormal findings in urine: Secondary | ICD-10-CM | POA: Diagnosis not present

## 2022-09-11 DIAGNOSIS — N2581 Secondary hyperparathyroidism of renal origin: Secondary | ICD-10-CM | POA: Diagnosis not present

## 2022-09-11 NOTE — Telephone Encounter (Signed)
Telephone encounter was:  Successful.  09/11/2022 Name: Felicia Thompson MRN: [id number] DOB: 1946-04-01  Felicia Thompson is a 76 y.o. year old female who is a primary care patient of Isaac Bliss, Rayford Halsted, MD . The community resource team was consulted for assistance with Transportation Needs   Care guide performed the following interventions: Patient provided with information about care guide support team and interviewed to confirm resource needs Discussed resources to assist with Transportation . Patient is not in [place] area and SW is assisting on [employer] application for patient. CG arranged a ride with [facility] transportation for 10/12 appointment as [employer] requires 3 days in advance to book rides. Patient then cancelled the ride shortly after under [facility] transportation due to not feeling well. Patient has been provided with [employer] contact information and CG will send the education transportation sheet in the mail. Pt has been advised: I have mailed the following information and if she has not received the information in 7 to 14 days or if she has any additional questions to please call me back at [phone]. Patient understood.  Contacts for Transportation: -[place] [phone] -[place] [phone]   Follow Up Plan:  No further follow up planned at this time. The patient has been provided with needed resources.    Felicia Thompson DOB: Jun 20, 1946 MRN: [id number]   RIDER WAIVER AND RELEASE OF LIABILITY  For purposes of improving physical access to our facilities, [place] is pleased to partner with third parties to provide [place] patients or other authorized individuals the option of convenient, on-demand ground transportation services (the [place]") through use of the technology service that enables users to request on-demand ground transportation from independent third-party providers.  By  opting to use and accept these [employer], I, the undersigned, hereby agree on behalf of myself, and on behalf of any minor child using the Government social research officer for whom I am the parent or legal guardian, as follows:  Government social research officer provided to me are provided by independent third-party transportation providers who are not [employer] or employees and who are unaffiliated with [employer]. [place] is neither a transportation carrier nor a common or public carrier. [place] has no control over the quality or safety of the transportation that occurs as a result of the [employer]. [place] cannot guarantee that any third-party transportation provider will complete any arranged transportation service. [facility] makes no representation, warranty, or guarantee regarding the reliability, timeliness, quality, safety, suitability, or availability of any of the Transport Services or that they will be error free. I fully understand that traveling by vehicle involves risks and dangers of serious bodily injury, including permanent disability, paralysis, and death. I agree, on behalf of myself and on behalf of any minor child using the Transport Services for whom I am the parent or legal guardian, that the entire risk arising out of my use of the [employer] remains solely with me, to the maximum extent permitted under applicable law. The [employer] are provided "as is" and "as available." [place] disclaims all representations and warranties, express, implied or statutory, not expressly set out in these terms, including the implied warranties of merchantability and fitness for a particular purpose. I hereby waive and release , its agents, employees, officers, directors, representatives, insurers, attorneys, assigns, successors, subsidiaries, and affiliates from any and all past, present, or future claims, demands, liabilities, actions, causes of action,  or suits of any kind directly or indirectly arising from acceptance and use of the [employer]. I further waive and release [place] and its affiliates from all present and future liability and responsibility for any injury or death to persons or damages to property caused by or related to the use of the [employer]. I have read this Waiver and Release of Liability, and I understand the terms used in it and their legal significance. This Waiver is freely and voluntarily given with the understanding that my right (as well as the right of any minor child for whom I am the parent or legal guardian using the [employer]) to legal recourse against [place] in connection with the [employer] is knowingly surrendered in return for use of these services.   I attest that I read the consent document to [name], gave Ms. Sherard the opportunity to ask questions and answered the questions asked (if any). I affirm that [name] then provided consent for she's participation in this program.     [place], [place] management  [place], [place]  Main Phone: [phone]  E-mail: [email or web address]  Website: [email or web address]

## 2022-09-11 NOTE — Patient Instructions (Signed)
Visit Information  Thank you for taking time to visit with me today. Please don't hesitate to contact me if I can be of assistance to you.   Following are the goals we discussed today:   Goals Addressed             This Visit's Progress    BSW Plan of Care- Care Coordination Activities       Care Coordination Interventions: Collaboration with LCSW who requests SW assistance with transportation resources Determined the patient has an appointment on 10/12 with [facility] that her daughter and son-in-law are unable to assist with Education provided to the patient on ACTA and [place] transit in [place] to determine the patient is covered under the [place] service area and was therefore unable to access ACTA transportation services Reviewed the [place] fixed route schedule with the patient to determine there were no stops the patient could safely access due to her inability to walk long distances Discussed plan for SW to complete an ADA Para-transit application on behalf of the patient for future door to door transportation with [place] the patient she did qualify for [facility] transportation services - referral made to assist the patient with transportation to 10/12 appointment Completed ADA Para Transit application - sent to LCSW for signature prior to submission         If you are experiencing a Mental Health or [place] or need someone to talk to, please call 1-800-273-TALK (toll free, 24 hour hotline)  Patient verbalizes understanding of instructions and care plan provided today and agrees to view in [place]. Active MyChart status and patient understanding of how to access instructions and care plan via MyChart confirmed with patient.     Daneen Schick, BSW, CDP Social Worker, Certified Dementia Practitioner [place] [facility]  Care Coordination [phone]

## 2022-09-11 NOTE — Patient Outreach (Signed)
  Care Coordination   Follow Up Visit Note   09/11/2022 Name: Rio Taber MRN: [id number] DOB: 1946-04-24  Jameka Ivie is a 76 y.o. year old female who sees Isaac Bliss, Rayford Halsted, MD for primary care. I spoke with  [name] by phone today.  What matters to the patients health and wellness today?  To identify resources for transportation    Goals Addressed             This Visit's Progress    BSW Plan of Care- Care Coordination Activities       Care Coordination Interventions: Collaboration with LCSW who requests SW assistance with transportation resources Determined the patient has an appointment on 10/12 with [facility] that her daughter and son-in-law are unable to assist with Education provided to the patient on ACTA and [place] transit in [place] to determine the patient is covered under the [place] service area and was therefore unable to access ACTA transportation services Reviewed the [place] fixed route schedule with the patient to determine there were no stops the patient could safely access due to her inability to walk long distances Discussed plan for SW to complete an ADA Para-transit application on behalf of the patient for future door to door transportation with [place] the patient she did qualify for [facility] transportation services - referral made to assist the patient with transportation to 10/12 appointment Completed ADA Para Transit application - sent to LCSW for signature prior to submission         SDOH assessments and interventions completed:  No     Care Coordination Interventions Activated:  Yes  Care Coordination Interventions:  Yes, provided   Follow up plan:  SW will continue to follow    Encounter Outcome:  Pt. Visit Completed   Daneen Schick, Arita Miss, CDP Social Worker, Certified Dementia Practitioner [facility] [address] [facility]  Care Coordination [phone]

## 2022-09-12 ENCOUNTER — Ambulatory Visit: Payer: Self-pay

## 2022-09-12 ENCOUNTER — Ambulatory Visit: Payer: Self-pay | Admitting: Licensed Clinical Social Worker

## 2022-09-12 DIAGNOSIS — N186 End stage renal disease: Secondary | ICD-10-CM | POA: Diagnosis not present

## 2022-09-12 DIAGNOSIS — Z79899 Other long term (current) drug therapy: Secondary | ICD-10-CM | POA: Diagnosis not present

## 2022-09-12 DIAGNOSIS — D631 Anemia in chronic kidney disease: Secondary | ICD-10-CM | POA: Diagnosis not present

## 2022-09-12 DIAGNOSIS — E875 Hyperkalemia: Secondary | ICD-10-CM | POA: Diagnosis not present

## 2022-09-12 DIAGNOSIS — K769 Liver disease, unspecified: Secondary | ICD-10-CM | POA: Diagnosis not present

## 2022-09-12 DIAGNOSIS — Z992 Dependence on renal dialysis: Secondary | ICD-10-CM | POA: Diagnosis not present

## 2022-09-12 DIAGNOSIS — N2581 Secondary hyperparathyroidism of renal origin: Secondary | ICD-10-CM | POA: Diagnosis not present

## 2022-09-12 DIAGNOSIS — D509 Iron deficiency anemia, unspecified: Secondary | ICD-10-CM | POA: Diagnosis not present

## 2022-09-12 DIAGNOSIS — R82998 Other abnormal findings in urine: Secondary | ICD-10-CM | POA: Diagnosis not present

## 2022-09-12 NOTE — Patient Outreach (Signed)
  Care Coordination   Follow Up Visit Note   09/12/2022 Name: Felicia Thompson MRN: [id number] DOB: 04-25-1946  Felicia Thompson is a 76 y.o. year old female who sees Isaac Bliss, Rayford Halsted, MD for primary care. I  submitted an application to [place] transit on behalf of the patient  What matters to the patients health and wellness today?  Unable to contact the patient    Goals Addressed             This Visit's Progress    COMPLETED: BSW Plan of Care- Care Coordination Activities       Care Coordination Interventions: Submitted ADA para-transit application to [employer] transit Unsuccessful outbound call placed to the patient to update on application  Collaboration with Christa See, CSW to update on interventions         SDOH assessments and interventions completed:  No     Care Coordination Interventions Activated:  Yes  Care Coordination Interventions:  Yes, provided   Follow up plan:  No BSW follow up planned. The patient will remain engaged with LCSW and RNCM.    Encounter Outcome:  Pt. Visit Completed   Daneen Schick, BSW, CDP Social Worker, Certified Dementia Practitioner [place] [facility]  Care Coordination [phone]

## 2022-09-12 NOTE — Patient Outreach (Signed)
  Care Coordination   Collaboration  Visit Note   09/12/2022 Name: Shaye Elling MRN: [id number] DOB: 10-12-1946  Chalee Hirota is a 76 y.o. year old female who sees Isaac Bliss, Rayford Halsted, MD for primary care. I  spoke with Hinton Dyer with [employer] and Daneen Schick, BSW with Care Coordination Services  What matters to the patients health and wellness today?  LINK application re-submission   Goals Addressed             This Visit's Progress    LCSW Plan of Care-Obtain Supportive Resources   On track    Care Coordination Interventions: Solution-Focused Strategies employed:  LCSW received incoming call from [place] with [employer]. States that she received Link application; however, is unable to view majority of selections throughout application, resulting in it being incomplete. LCSW collaborated with Daneen Schick, who agreed to re-send application to Dana's e-mail address at [email or web address]         SDOH assessments and interventions completed:  No     Care Coordination Interventions Activated:  Yes  Care Coordination Interventions:  Yes, provided   Follow up plan: Follow up call scheduled for 10/31    Encounter Outcome:  Pt. Visit Completed   Christa See, MSW, [email or web address] Phone [phone] 5:49 PM

## 2022-09-13 DIAGNOSIS — D509 Iron deficiency anemia, unspecified: Secondary | ICD-10-CM | POA: Diagnosis not present

## 2022-09-13 DIAGNOSIS — N186 End stage renal disease: Secondary | ICD-10-CM | POA: Diagnosis not present

## 2022-09-13 DIAGNOSIS — Z79899 Other long term (current) drug therapy: Secondary | ICD-10-CM | POA: Diagnosis not present

## 2022-09-13 DIAGNOSIS — N2581 Secondary hyperparathyroidism of renal origin: Secondary | ICD-10-CM | POA: Diagnosis not present

## 2022-09-13 DIAGNOSIS — K769 Liver disease, unspecified: Secondary | ICD-10-CM | POA: Diagnosis not present

## 2022-09-13 DIAGNOSIS — E875 Hyperkalemia: Secondary | ICD-10-CM | POA: Diagnosis not present

## 2022-09-13 DIAGNOSIS — D631 Anemia in chronic kidney disease: Secondary | ICD-10-CM | POA: Diagnosis not present

## 2022-09-13 DIAGNOSIS — Z992 Dependence on renal dialysis: Secondary | ICD-10-CM | POA: Diagnosis not present

## 2022-09-13 DIAGNOSIS — R82998 Other abnormal findings in urine: Secondary | ICD-10-CM | POA: Diagnosis not present

## 2022-09-14 DIAGNOSIS — D631 Anemia in chronic kidney disease: Secondary | ICD-10-CM | POA: Diagnosis not present

## 2022-09-14 DIAGNOSIS — Z992 Dependence on renal dialysis: Secondary | ICD-10-CM | POA: Diagnosis not present

## 2022-09-14 DIAGNOSIS — K769 Liver disease, unspecified: Secondary | ICD-10-CM | POA: Diagnosis not present

## 2022-09-14 DIAGNOSIS — N2581 Secondary hyperparathyroidism of renal origin: Secondary | ICD-10-CM | POA: Diagnosis not present

## 2022-09-14 DIAGNOSIS — E875 Hyperkalemia: Secondary | ICD-10-CM | POA: Diagnosis not present

## 2022-09-14 DIAGNOSIS — Z79899 Other long term (current) drug therapy: Secondary | ICD-10-CM | POA: Diagnosis not present

## 2022-09-14 DIAGNOSIS — N186 End stage renal disease: Secondary | ICD-10-CM | POA: Diagnosis not present

## 2022-09-14 DIAGNOSIS — D509 Iron deficiency anemia, unspecified: Secondary | ICD-10-CM | POA: Diagnosis not present

## 2022-09-14 DIAGNOSIS — R82998 Other abnormal findings in urine: Secondary | ICD-10-CM | POA: Diagnosis not present

## 2022-09-15 DIAGNOSIS — Z79899 Other long term (current) drug therapy: Secondary | ICD-10-CM | POA: Diagnosis not present

## 2022-09-15 DIAGNOSIS — E875 Hyperkalemia: Secondary | ICD-10-CM | POA: Diagnosis not present

## 2022-09-15 DIAGNOSIS — D509 Iron deficiency anemia, unspecified: Secondary | ICD-10-CM | POA: Diagnosis not present

## 2022-09-15 DIAGNOSIS — Z992 Dependence on renal dialysis: Secondary | ICD-10-CM | POA: Diagnosis not present

## 2022-09-15 DIAGNOSIS — D631 Anemia in chronic kidney disease: Secondary | ICD-10-CM | POA: Diagnosis not present

## 2022-09-15 DIAGNOSIS — N186 End stage renal disease: Secondary | ICD-10-CM | POA: Diagnosis not present

## 2022-09-15 DIAGNOSIS — N2581 Secondary hyperparathyroidism of renal origin: Secondary | ICD-10-CM | POA: Diagnosis not present

## 2022-09-15 DIAGNOSIS — K769 Liver disease, unspecified: Secondary | ICD-10-CM | POA: Diagnosis not present

## 2022-09-15 DIAGNOSIS — R82998 Other abnormal findings in urine: Secondary | ICD-10-CM | POA: Diagnosis not present

## 2022-09-16 ENCOUNTER — Ambulatory Visit: Payer: Medicare Other | Admitting: Internal Medicine

## 2022-09-16 DIAGNOSIS — E875 Hyperkalemia: Secondary | ICD-10-CM | POA: Diagnosis not present

## 2022-09-16 DIAGNOSIS — N2581 Secondary hyperparathyroidism of renal origin: Secondary | ICD-10-CM | POA: Diagnosis not present

## 2022-09-16 DIAGNOSIS — R82998 Other abnormal findings in urine: Secondary | ICD-10-CM | POA: Diagnosis not present

## 2022-09-16 DIAGNOSIS — D509 Iron deficiency anemia, unspecified: Secondary | ICD-10-CM | POA: Diagnosis not present

## 2022-09-16 DIAGNOSIS — Z79899 Other long term (current) drug therapy: Secondary | ICD-10-CM | POA: Diagnosis not present

## 2022-09-16 DIAGNOSIS — Z992 Dependence on renal dialysis: Secondary | ICD-10-CM | POA: Diagnosis not present

## 2022-09-16 DIAGNOSIS — D631 Anemia in chronic kidney disease: Secondary | ICD-10-CM | POA: Diagnosis not present

## 2022-09-16 DIAGNOSIS — N186 End stage renal disease: Secondary | ICD-10-CM | POA: Diagnosis not present

## 2022-09-16 DIAGNOSIS — K769 Liver disease, unspecified: Secondary | ICD-10-CM | POA: Diagnosis not present

## 2022-09-17 DIAGNOSIS — K769 Liver disease, unspecified: Secondary | ICD-10-CM | POA: Diagnosis not present

## 2022-09-17 DIAGNOSIS — E875 Hyperkalemia: Secondary | ICD-10-CM | POA: Diagnosis not present

## 2022-09-17 DIAGNOSIS — Z992 Dependence on renal dialysis: Secondary | ICD-10-CM | POA: Diagnosis not present

## 2022-09-17 DIAGNOSIS — D509 Iron deficiency anemia, unspecified: Secondary | ICD-10-CM | POA: Diagnosis not present

## 2022-09-17 DIAGNOSIS — Z79899 Other long term (current) drug therapy: Secondary | ICD-10-CM | POA: Diagnosis not present

## 2022-09-17 DIAGNOSIS — E113293 Type 2 diabetes mellitus with mild nonproliferative diabetic retinopathy without macular edema, bilateral: Secondary | ICD-10-CM | POA: Diagnosis not present

## 2022-09-17 DIAGNOSIS — D631 Anemia in chronic kidney disease: Secondary | ICD-10-CM | POA: Diagnosis not present

## 2022-09-17 DIAGNOSIS — R82998 Other abnormal findings in urine: Secondary | ICD-10-CM | POA: Diagnosis not present

## 2022-09-17 DIAGNOSIS — N2581 Secondary hyperparathyroidism of renal origin: Secondary | ICD-10-CM | POA: Diagnosis not present

## 2022-09-17 DIAGNOSIS — N186 End stage renal disease: Secondary | ICD-10-CM | POA: Diagnosis not present

## 2022-09-18 ENCOUNTER — Ambulatory Visit: Payer: Medicare Other | Admitting: Internal Medicine

## 2022-09-18 DIAGNOSIS — N2581 Secondary hyperparathyroidism of renal origin: Secondary | ICD-10-CM | POA: Diagnosis not present

## 2022-09-18 DIAGNOSIS — E875 Hyperkalemia: Secondary | ICD-10-CM | POA: Diagnosis not present

## 2022-09-18 DIAGNOSIS — Z79899 Other long term (current) drug therapy: Secondary | ICD-10-CM | POA: Diagnosis not present

## 2022-09-18 DIAGNOSIS — R82998 Other abnormal findings in urine: Secondary | ICD-10-CM | POA: Diagnosis not present

## 2022-09-18 DIAGNOSIS — N186 End stage renal disease: Secondary | ICD-10-CM | POA: Diagnosis not present

## 2022-09-18 DIAGNOSIS — D631 Anemia in chronic kidney disease: Secondary | ICD-10-CM | POA: Diagnosis not present

## 2022-09-18 DIAGNOSIS — D509 Iron deficiency anemia, unspecified: Secondary | ICD-10-CM | POA: Diagnosis not present

## 2022-09-18 DIAGNOSIS — Z992 Dependence on renal dialysis: Secondary | ICD-10-CM | POA: Diagnosis not present

## 2022-09-18 DIAGNOSIS — K769 Liver disease, unspecified: Secondary | ICD-10-CM | POA: Diagnosis not present

## 2022-09-19 DIAGNOSIS — N186 End stage renal disease: Secondary | ICD-10-CM | POA: Diagnosis not present

## 2022-09-19 DIAGNOSIS — D631 Anemia in chronic kidney disease: Secondary | ICD-10-CM | POA: Diagnosis not present

## 2022-09-19 DIAGNOSIS — R82998 Other abnormal findings in urine: Secondary | ICD-10-CM | POA: Diagnosis not present

## 2022-09-19 DIAGNOSIS — E875 Hyperkalemia: Secondary | ICD-10-CM | POA: Diagnosis not present

## 2022-09-19 DIAGNOSIS — Z79899 Other long term (current) drug therapy: Secondary | ICD-10-CM | POA: Diagnosis not present

## 2022-09-19 DIAGNOSIS — N2581 Secondary hyperparathyroidism of renal origin: Secondary | ICD-10-CM | POA: Diagnosis not present

## 2022-09-19 DIAGNOSIS — Z992 Dependence on renal dialysis: Secondary | ICD-10-CM | POA: Diagnosis not present

## 2022-09-19 DIAGNOSIS — K769 Liver disease, unspecified: Secondary | ICD-10-CM | POA: Diagnosis not present

## 2022-09-19 DIAGNOSIS — D509 Iron deficiency anemia, unspecified: Secondary | ICD-10-CM | POA: Diagnosis not present

## 2022-09-20 DIAGNOSIS — K769 Liver disease, unspecified: Secondary | ICD-10-CM | POA: Diagnosis not present

## 2022-09-20 DIAGNOSIS — R82998 Other abnormal findings in urine: Secondary | ICD-10-CM | POA: Diagnosis not present

## 2022-09-20 DIAGNOSIS — E875 Hyperkalemia: Secondary | ICD-10-CM | POA: Diagnosis not present

## 2022-09-20 DIAGNOSIS — Z79899 Other long term (current) drug therapy: Secondary | ICD-10-CM | POA: Diagnosis not present

## 2022-09-20 DIAGNOSIS — D509 Iron deficiency anemia, unspecified: Secondary | ICD-10-CM | POA: Diagnosis not present

## 2022-09-20 DIAGNOSIS — N2581 Secondary hyperparathyroidism of renal origin: Secondary | ICD-10-CM | POA: Diagnosis not present

## 2022-09-20 DIAGNOSIS — N186 End stage renal disease: Secondary | ICD-10-CM | POA: Diagnosis not present

## 2022-09-20 DIAGNOSIS — Z992 Dependence on renal dialysis: Secondary | ICD-10-CM | POA: Diagnosis not present

## 2022-09-20 DIAGNOSIS — D631 Anemia in chronic kidney disease: Secondary | ICD-10-CM | POA: Diagnosis not present

## 2022-09-21 DIAGNOSIS — N186 End stage renal disease: Secondary | ICD-10-CM | POA: Diagnosis not present

## 2022-09-21 DIAGNOSIS — D509 Iron deficiency anemia, unspecified: Secondary | ICD-10-CM | POA: Diagnosis not present

## 2022-09-21 DIAGNOSIS — D631 Anemia in chronic kidney disease: Secondary | ICD-10-CM | POA: Diagnosis not present

## 2022-09-21 DIAGNOSIS — N2581 Secondary hyperparathyroidism of renal origin: Secondary | ICD-10-CM | POA: Diagnosis not present

## 2022-09-21 DIAGNOSIS — E875 Hyperkalemia: Secondary | ICD-10-CM | POA: Diagnosis not present

## 2022-09-21 DIAGNOSIS — K769 Liver disease, unspecified: Secondary | ICD-10-CM | POA: Diagnosis not present

## 2022-09-21 DIAGNOSIS — Z992 Dependence on renal dialysis: Secondary | ICD-10-CM | POA: Diagnosis not present

## 2022-09-21 DIAGNOSIS — R82998 Other abnormal findings in urine: Secondary | ICD-10-CM | POA: Diagnosis not present

## 2022-09-21 DIAGNOSIS — Z79899 Other long term (current) drug therapy: Secondary | ICD-10-CM | POA: Diagnosis not present

## 2022-09-22 DIAGNOSIS — K769 Liver disease, unspecified: Secondary | ICD-10-CM | POA: Diagnosis not present

## 2022-09-22 DIAGNOSIS — D509 Iron deficiency anemia, unspecified: Secondary | ICD-10-CM | POA: Diagnosis not present

## 2022-09-22 DIAGNOSIS — N186 End stage renal disease: Secondary | ICD-10-CM | POA: Diagnosis not present

## 2022-09-22 DIAGNOSIS — Z992 Dependence on renal dialysis: Secondary | ICD-10-CM | POA: Diagnosis not present

## 2022-09-22 DIAGNOSIS — Z79899 Other long term (current) drug therapy: Secondary | ICD-10-CM | POA: Diagnosis not present

## 2022-09-22 DIAGNOSIS — N2581 Secondary hyperparathyroidism of renal origin: Secondary | ICD-10-CM | POA: Diagnosis not present

## 2022-09-22 DIAGNOSIS — R82998 Other abnormal findings in urine: Secondary | ICD-10-CM | POA: Diagnosis not present

## 2022-09-22 DIAGNOSIS — E875 Hyperkalemia: Secondary | ICD-10-CM | POA: Diagnosis not present

## 2022-09-22 DIAGNOSIS — D631 Anemia in chronic kidney disease: Secondary | ICD-10-CM | POA: Diagnosis not present

## 2022-09-23 ENCOUNTER — Ambulatory Visit: Payer: Self-pay | Admitting: *Deleted

## 2022-09-23 DIAGNOSIS — Z79899 Other long term (current) drug therapy: Secondary | ICD-10-CM | POA: Diagnosis not present

## 2022-09-23 DIAGNOSIS — E875 Hyperkalemia: Secondary | ICD-10-CM | POA: Diagnosis not present

## 2022-09-23 DIAGNOSIS — D631 Anemia in chronic kidney disease: Secondary | ICD-10-CM | POA: Diagnosis not present

## 2022-09-23 DIAGNOSIS — N2581 Secondary hyperparathyroidism of renal origin: Secondary | ICD-10-CM | POA: Diagnosis not present

## 2022-09-23 DIAGNOSIS — R82998 Other abnormal findings in urine: Secondary | ICD-10-CM | POA: Diagnosis not present

## 2022-09-23 DIAGNOSIS — D509 Iron deficiency anemia, unspecified: Secondary | ICD-10-CM | POA: Diagnosis not present

## 2022-09-23 DIAGNOSIS — K769 Liver disease, unspecified: Secondary | ICD-10-CM | POA: Diagnosis not present

## 2022-09-23 DIAGNOSIS — Z992 Dependence on renal dialysis: Secondary | ICD-10-CM | POA: Diagnosis not present

## 2022-09-23 DIAGNOSIS — N186 End stage renal disease: Secondary | ICD-10-CM | POA: Diagnosis not present

## 2022-09-23 NOTE — Patient Outreach (Signed)
  Care Coordination   09/23/2022 Name: Felicia Thompson MRN: [id number] DOB: 10-30-1946   Care Coordination Outreach Attempts:  An unsuccessful telephone outreach was attempted today to offer the patient information about available care coordination services as a benefit of their health plan.   Follow Up Plan:  Additional outreach attempts will be made to offer the patient care coordination information and services.   Encounter Outcome:  No Answer  Care Coordination Interventions Activated:  No   Care Coordination Interventions:  No, not indicated    Raina Mina, RN Care Management Coordinator [place] Office [phone]

## 2022-09-24 DIAGNOSIS — N186 End stage renal disease: Secondary | ICD-10-CM | POA: Diagnosis not present

## 2022-09-24 DIAGNOSIS — Z992 Dependence on renal dialysis: Secondary | ICD-10-CM | POA: Diagnosis not present

## 2022-09-24 DIAGNOSIS — R82998 Other abnormal findings in urine: Secondary | ICD-10-CM | POA: Diagnosis not present

## 2022-09-24 DIAGNOSIS — E875 Hyperkalemia: Secondary | ICD-10-CM | POA: Diagnosis not present

## 2022-09-24 DIAGNOSIS — Z79899 Other long term (current) drug therapy: Secondary | ICD-10-CM | POA: Diagnosis not present

## 2022-09-24 DIAGNOSIS — N2581 Secondary hyperparathyroidism of renal origin: Secondary | ICD-10-CM | POA: Diagnosis not present

## 2022-09-24 DIAGNOSIS — K769 Liver disease, unspecified: Secondary | ICD-10-CM | POA: Diagnosis not present

## 2022-09-24 DIAGNOSIS — D509 Iron deficiency anemia, unspecified: Secondary | ICD-10-CM | POA: Diagnosis not present

## 2022-09-24 DIAGNOSIS — D631 Anemia in chronic kidney disease: Secondary | ICD-10-CM | POA: Diagnosis not present

## 2022-09-25 DIAGNOSIS — R82998 Other abnormal findings in urine: Secondary | ICD-10-CM | POA: Diagnosis not present

## 2022-09-25 DIAGNOSIS — D509 Iron deficiency anemia, unspecified: Secondary | ICD-10-CM | POA: Diagnosis not present

## 2022-09-25 DIAGNOSIS — K769 Liver disease, unspecified: Secondary | ICD-10-CM | POA: Diagnosis not present

## 2022-09-25 DIAGNOSIS — Z79899 Other long term (current) drug therapy: Secondary | ICD-10-CM | POA: Diagnosis not present

## 2022-09-25 DIAGNOSIS — N186 End stage renal disease: Secondary | ICD-10-CM | POA: Diagnosis not present

## 2022-09-25 DIAGNOSIS — N2581 Secondary hyperparathyroidism of renal origin: Secondary | ICD-10-CM | POA: Diagnosis not present

## 2022-09-25 DIAGNOSIS — D631 Anemia in chronic kidney disease: Secondary | ICD-10-CM | POA: Diagnosis not present

## 2022-09-25 DIAGNOSIS — Z992 Dependence on renal dialysis: Secondary | ICD-10-CM | POA: Diagnosis not present

## 2022-09-25 DIAGNOSIS — E875 Hyperkalemia: Secondary | ICD-10-CM | POA: Diagnosis not present

## 2022-09-26 ENCOUNTER — Encounter: Payer: Self-pay | Admitting: *Deleted

## 2022-09-26 ENCOUNTER — Ambulatory Visit: Payer: Self-pay | Admitting: *Deleted

## 2022-09-26 DIAGNOSIS — K769 Liver disease, unspecified: Secondary | ICD-10-CM | POA: Diagnosis not present

## 2022-09-26 DIAGNOSIS — D509 Iron deficiency anemia, unspecified: Secondary | ICD-10-CM | POA: Diagnosis not present

## 2022-09-26 DIAGNOSIS — E875 Hyperkalemia: Secondary | ICD-10-CM | POA: Diagnosis not present

## 2022-09-26 DIAGNOSIS — N186 End stage renal disease: Secondary | ICD-10-CM | POA: Diagnosis not present

## 2022-09-26 DIAGNOSIS — N2581 Secondary hyperparathyroidism of renal origin: Secondary | ICD-10-CM | POA: Diagnosis not present

## 2022-09-26 DIAGNOSIS — D631 Anemia in chronic kidney disease: Secondary | ICD-10-CM | POA: Diagnosis not present

## 2022-09-26 DIAGNOSIS — R82998 Other abnormal findings in urine: Secondary | ICD-10-CM | POA: Diagnosis not present

## 2022-09-26 DIAGNOSIS — Z992 Dependence on renal dialysis: Secondary | ICD-10-CM | POA: Diagnosis not present

## 2022-09-26 DIAGNOSIS — Z79899 Other long term (current) drug therapy: Secondary | ICD-10-CM | POA: Diagnosis not present

## 2022-09-26 NOTE — Patient Instructions (Signed)
Visit Information  Thank you for taking time to visit with me today. Please don't hesitate to contact me if I can be of assistance to you.   Following are the goals we discussed today:   Goals Addressed               This Visit's Progress     COMPLETED: "What's a good b/p for me" (pt-stated)        Care Coordination Interventions: Provided education to patient re: stroke prevention, s/s of heart attack and stroke Reviewed medications with patient and discussed importance of compliance Counseled on the importance of exercise goals with target of 150 minutes per week Advised patient, providing education and rationale, to monitor blood pressure daily and record, calling PCP for findings outside established parameters Reviewed scheduled/upcoming provider appointments including:  Discussed complications of poorly controlled blood pressure such as heart disease, stroke, circulatory complications, vision complications, kidney impairment, sexual dysfunction Screening for signs and symptoms of depression related to chronic disease state  Assessed social determinant of health barriers *  Discussed and offered a plan of care for managing her HTN however pt only receptive to adding information about HTN to her AVS to review. She also requested RN contact information to call if she developed any needs at a later date.  Pt current denies any needs and continue to work with LCSW and BSW for transportation needs.  Pt also inquired about counseling as RN offered LCSW to counsel until possible referral can be established from her provider for a psychologist or psychiatrists to generate a direct treatment plan longer term (pt declined both a referral and consult with the LCSW). Note supportive resources have been provided as documented by Jasmine (LCSW).  Address medical condition as RN case manager inquired on any issues to improve however pt opt to declined assistance at this time. Pt mentioned she has  loss her balance a few times with falls however declined the use of DME to assist with this problems. No further needs at this time as RN case manager will attach HTN/Falls information to AVS for pt to review and close this case.          Please call the care guide team at [phone] if you need to cancel or reschedule your appointment.   If you are experiencing a Mental Health or [place] or need someone to talk to, please call the Suicide and Crisis Lifeline: 988 call the [place] National Suicide Prevention Lifeline: 9094566342 or TTY: (250) 757-4975 TTY 254-817-6300) to talk to a trained counselor call 1-800-273-TALK (toll free, 24 hour hotline)  Patient verbalizes understanding of instructions and care plan provided today and agrees to view in [place]. Active MyChart status and patient understanding of how to access instructions and care plan via MyChart confirmed with patient.     No further follow up required: No further needs  Raina Mina, RN Care Management Coordinator [place] Office [phone]

## 2022-09-27 DIAGNOSIS — Z79899 Other long term (current) drug therapy: Secondary | ICD-10-CM | POA: Diagnosis not present

## 2022-09-27 DIAGNOSIS — E875 Hyperkalemia: Secondary | ICD-10-CM | POA: Diagnosis not present

## 2022-09-27 DIAGNOSIS — N186 End stage renal disease: Secondary | ICD-10-CM | POA: Diagnosis not present

## 2022-09-27 DIAGNOSIS — N2581 Secondary hyperparathyroidism of renal origin: Secondary | ICD-10-CM | POA: Diagnosis not present

## 2022-09-27 DIAGNOSIS — K769 Liver disease, unspecified: Secondary | ICD-10-CM | POA: Diagnosis not present

## 2022-09-27 DIAGNOSIS — Z992 Dependence on renal dialysis: Secondary | ICD-10-CM | POA: Diagnosis not present

## 2022-09-27 DIAGNOSIS — D631 Anemia in chronic kidney disease: Secondary | ICD-10-CM | POA: Diagnosis not present

## 2022-09-27 DIAGNOSIS — R82998 Other abnormal findings in urine: Secondary | ICD-10-CM | POA: Diagnosis not present

## 2022-09-27 DIAGNOSIS — D509 Iron deficiency anemia, unspecified: Secondary | ICD-10-CM | POA: Diagnosis not present

## 2022-09-28 DIAGNOSIS — D631 Anemia in chronic kidney disease: Secondary | ICD-10-CM | POA: Diagnosis not present

## 2022-09-28 DIAGNOSIS — K769 Liver disease, unspecified: Secondary | ICD-10-CM | POA: Diagnosis not present

## 2022-09-28 DIAGNOSIS — N2581 Secondary hyperparathyroidism of renal origin: Secondary | ICD-10-CM | POA: Diagnosis not present

## 2022-09-28 DIAGNOSIS — D509 Iron deficiency anemia, unspecified: Secondary | ICD-10-CM | POA: Diagnosis not present

## 2022-09-28 DIAGNOSIS — Z79899 Other long term (current) drug therapy: Secondary | ICD-10-CM | POA: Diagnosis not present

## 2022-09-28 DIAGNOSIS — Z992 Dependence on renal dialysis: Secondary | ICD-10-CM | POA: Diagnosis not present

## 2022-09-28 DIAGNOSIS — N186 End stage renal disease: Secondary | ICD-10-CM | POA: Diagnosis not present

## 2022-09-28 DIAGNOSIS — R82998 Other abnormal findings in urine: Secondary | ICD-10-CM | POA: Diagnosis not present

## 2022-09-28 DIAGNOSIS — E875 Hyperkalemia: Secondary | ICD-10-CM | POA: Diagnosis not present

## 2022-09-29 DIAGNOSIS — D509 Iron deficiency anemia, unspecified: Secondary | ICD-10-CM | POA: Diagnosis not present

## 2022-09-29 DIAGNOSIS — K769 Liver disease, unspecified: Secondary | ICD-10-CM | POA: Diagnosis not present

## 2022-09-29 DIAGNOSIS — E875 Hyperkalemia: Secondary | ICD-10-CM | POA: Diagnosis not present

## 2022-09-29 DIAGNOSIS — Z79899 Other long term (current) drug therapy: Secondary | ICD-10-CM | POA: Diagnosis not present

## 2022-09-29 DIAGNOSIS — D631 Anemia in chronic kidney disease: Secondary | ICD-10-CM | POA: Diagnosis not present

## 2022-09-29 DIAGNOSIS — R82998 Other abnormal findings in urine: Secondary | ICD-10-CM | POA: Diagnosis not present

## 2022-09-29 DIAGNOSIS — Z992 Dependence on renal dialysis: Secondary | ICD-10-CM | POA: Diagnosis not present

## 2022-09-29 DIAGNOSIS — N2581 Secondary hyperparathyroidism of renal origin: Secondary | ICD-10-CM | POA: Diagnosis not present

## 2022-09-29 DIAGNOSIS — N186 End stage renal disease: Secondary | ICD-10-CM | POA: Diagnosis not present

## 2022-09-30 DIAGNOSIS — N186 End stage renal disease: Secondary | ICD-10-CM | POA: Diagnosis not present

## 2022-09-30 DIAGNOSIS — Z79899 Other long term (current) drug therapy: Secondary | ICD-10-CM | POA: Diagnosis not present

## 2022-09-30 DIAGNOSIS — R82998 Other abnormal findings in urine: Secondary | ICD-10-CM | POA: Diagnosis not present

## 2022-09-30 DIAGNOSIS — D509 Iron deficiency anemia, unspecified: Secondary | ICD-10-CM | POA: Diagnosis not present

## 2022-09-30 DIAGNOSIS — K769 Liver disease, unspecified: Secondary | ICD-10-CM | POA: Diagnosis not present

## 2022-09-30 DIAGNOSIS — N2581 Secondary hyperparathyroidism of renal origin: Secondary | ICD-10-CM | POA: Diagnosis not present

## 2022-09-30 DIAGNOSIS — Z992 Dependence on renal dialysis: Secondary | ICD-10-CM | POA: Diagnosis not present

## 2022-09-30 DIAGNOSIS — D631 Anemia in chronic kidney disease: Secondary | ICD-10-CM | POA: Diagnosis not present

## 2022-09-30 DIAGNOSIS — E875 Hyperkalemia: Secondary | ICD-10-CM | POA: Diagnosis not present

## 2022-10-01 ENCOUNTER — Ambulatory Visit: Payer: Self-pay | Admitting: Licensed Clinical Social Worker

## 2022-10-01 DIAGNOSIS — Z992 Dependence on renal dialysis: Secondary | ICD-10-CM | POA: Diagnosis not present

## 2022-10-01 DIAGNOSIS — Z79899 Other long term (current) drug therapy: Secondary | ICD-10-CM | POA: Diagnosis not present

## 2022-10-01 DIAGNOSIS — D631 Anemia in chronic kidney disease: Secondary | ICD-10-CM | POA: Diagnosis not present

## 2022-10-01 DIAGNOSIS — D509 Iron deficiency anemia, unspecified: Secondary | ICD-10-CM | POA: Diagnosis not present

## 2022-10-01 DIAGNOSIS — E875 Hyperkalemia: Secondary | ICD-10-CM | POA: Diagnosis not present

## 2022-10-01 DIAGNOSIS — R82998 Other abnormal findings in urine: Secondary | ICD-10-CM | POA: Diagnosis not present

## 2022-10-01 DIAGNOSIS — N186 End stage renal disease: Secondary | ICD-10-CM | POA: Diagnosis not present

## 2022-10-01 DIAGNOSIS — N2581 Secondary hyperparathyroidism of renal origin: Secondary | ICD-10-CM | POA: Diagnosis not present

## 2022-10-01 DIAGNOSIS — K769 Liver disease, unspecified: Secondary | ICD-10-CM | POA: Diagnosis not present

## 2022-10-03 DIAGNOSIS — K769 Liver disease, unspecified: Secondary | ICD-10-CM | POA: Diagnosis not present

## 2022-10-03 DIAGNOSIS — R82998 Other abnormal findings in urine: Secondary | ICD-10-CM | POA: Diagnosis not present

## 2022-10-03 DIAGNOSIS — E875 Hyperkalemia: Secondary | ICD-10-CM | POA: Diagnosis not present

## 2022-10-03 DIAGNOSIS — Z79899 Other long term (current) drug therapy: Secondary | ICD-10-CM | POA: Diagnosis not present

## 2022-10-03 DIAGNOSIS — N186 End stage renal disease: Secondary | ICD-10-CM | POA: Diagnosis not present

## 2022-10-03 DIAGNOSIS — N2581 Secondary hyperparathyroidism of renal origin: Secondary | ICD-10-CM | POA: Diagnosis not present

## 2022-10-03 DIAGNOSIS — D631 Anemia in chronic kidney disease: Secondary | ICD-10-CM | POA: Diagnosis not present

## 2022-10-03 DIAGNOSIS — D509 Iron deficiency anemia, unspecified: Secondary | ICD-10-CM | POA: Diagnosis not present

## 2022-10-03 DIAGNOSIS — Z992 Dependence on renal dialysis: Secondary | ICD-10-CM | POA: Diagnosis not present

## 2022-10-03 DIAGNOSIS — Z23 Encounter for immunization: Secondary | ICD-10-CM | POA: Diagnosis not present

## 2022-10-03 NOTE — Patient Outreach (Signed)
  Care Coordination   Initial Visit Note   10/03/2022 Name: Arline Ketter MRN: [id number] DOB: [birth year]  Doryce Mcgregory is a 76 y.o. year old female who sees Isaac Bliss, Rayford Halsted, MD for primary care. I spoke with  [name] by phone today.  What matters to the patients health and wellness today?  Transportation    Goals Addressed             This Visit's Progress    LCSW Plan of Care-Obtain Supportive Resources   On track    Care Coordination Interventions: Solution-Focused Strategies employed:  Active listening / Reflection utilized  Emotional Support Provided Verbalization of feelings encouraged  Patient identified barriers to managing chronic health conditions, including transportation. Pt is unable to drive independently [employer] mailed pt an application to complete; however, she is unsure if she would qualify. Patient has not submitted application Patient receives strong support from daughter and son in law and daughter, who works outside of the home Pt verified with [employer] that she has twelve round way trips included in benefits. She reports the company utilizes an unreliable agency noting they did not pick her up for an appt, causing her to no-show her medical appt  LCSW reviewed upcoming appts. Pt has upcoming eye appts and surgery in November. LCSW will follow up with Care Guide and BSW, Daneen Schick, about pt's eligibility for [facility] transportation, which was offered in the past.         SDOH assessments and interventions completed:  No     Care Coordination Interventions Activated:  Yes  Care Coordination Interventions:  Yes, provided   Follow up plan: Follow up call scheduled for 2-4 weeks    Encounter Outcome:  Pt. Visit Completed   Christa See, MSW, [email or web address] Phone [phone] 8:38 PM

## 2022-10-03 NOTE — Patient Instructions (Signed)
Visit Information  Thank you for taking time to visit with me today. Please don't hesitate to contact me if I can be of assistance to you.   Following are the goals we discussed today:   Goals Addressed             This Visit's Progress    LCSW Plan of Care-Obtain Supportive Resources   On track    Care Coordination Interventions: Solution-Focused Strategies employed:  Active listening / Reflection utilized  Emotional Support Provided Verbalization of feelings encouraged  Patient identified barriers to managing chronic health conditions, including transportation. Pt is unable to drive independently [employer] mailed pt an application to complete; however, she is unsure if she would qualify. Patient has not submitted application Patient receives strong support from daughter and son in law and daughter, who works outside of the home Pt verified with [employer] that she has twelve round way trips included in benefits. She reports the company utilizes an unreliable agency noting they did not pick her up for an appt, causing her to no-show her medical appt  LCSW reviewed upcoming appts. Pt has upcoming eye appts and surgery in November. LCSW will follow up with Care Guide and BSW, Daneen Schick, about pt's eligibility for [facility] transportation, which was offered in the past.         If you are experiencing a Mental Health or [place] or need someone to talk to, please call the Suicide and Crisis Lifeline: 988 call 911   Patient verbalizes understanding of instructions and care plan provided today and agrees to view in [place]. Active MyChart status and patient understanding of how to access instructions and care plan via MyChart confirmed with patient.     Christa See, MSW, [email or web address] Phone [phone] 8:38 PM

## 2022-10-04 ENCOUNTER — Telehealth: Payer: Self-pay | Admitting: Licensed Clinical Social Worker

## 2022-10-04 DIAGNOSIS — K769 Liver disease, unspecified: Secondary | ICD-10-CM | POA: Diagnosis not present

## 2022-10-04 DIAGNOSIS — D631 Anemia in chronic kidney disease: Secondary | ICD-10-CM | POA: Diagnosis not present

## 2022-10-04 DIAGNOSIS — Z79899 Other long term (current) drug therapy: Secondary | ICD-10-CM | POA: Diagnosis not present

## 2022-10-04 DIAGNOSIS — Z23 Encounter for immunization: Secondary | ICD-10-CM | POA: Diagnosis not present

## 2022-10-04 DIAGNOSIS — R82998 Other abnormal findings in urine: Secondary | ICD-10-CM | POA: Diagnosis not present

## 2022-10-04 DIAGNOSIS — E875 Hyperkalemia: Secondary | ICD-10-CM | POA: Diagnosis not present

## 2022-10-04 DIAGNOSIS — Z992 Dependence on renal dialysis: Secondary | ICD-10-CM | POA: Diagnosis not present

## 2022-10-04 DIAGNOSIS — N186 End stage renal disease: Secondary | ICD-10-CM | POA: Diagnosis not present

## 2022-10-04 DIAGNOSIS — N2581 Secondary hyperparathyroidism of renal origin: Secondary | ICD-10-CM | POA: Diagnosis not present

## 2022-10-04 DIAGNOSIS — E118 Type 2 diabetes mellitus with unspecified complications: Secondary | ICD-10-CM

## 2022-10-04 DIAGNOSIS — D509 Iron deficiency anemia, unspecified: Secondary | ICD-10-CM | POA: Diagnosis not present

## 2022-10-04 NOTE — Patient Outreach (Signed)
  Care Coordination   Collaboration  Visit Note   10/04/2022 Name: Felicia Thompson MRN: [id number] DOB: 1946/10/09  Felicia Thompson is a 76 y.o. year old female who sees Isaac Bliss, Rayford Halsted, MD for primary care. I  spoke with colleague, Daneen Schick, BSW  What matters to the patients health and wellness today?  Transportation Needs Pt was not engaged during encounter   Goals Addressed             This Visit's Progress    LCSW Plan of Care-Obtain Supportive Resources   On track    Care Coordination Interventions: Solution-Focused Strategies employed:  Patient identified barriers to managing chronic health conditions, including transportation. Pt is unable to drive independently [employer] mailed pt an application to complete; however, she is unsure if she would qualify. Patient has not submitted application LCSW collaborated with Daneen Schick, BSW about pt's eligibility for [facility] transportation, which was offered in the past. Care Guide referral placed to assist with upcoming transportation needs         SDOH assessments and interventions completed:  No     Care Coordination Interventions Activated:  Yes  Care Coordination Interventions:  Yes, provided   Follow up plan: Follow up call scheduled for 2 weeks    Encounter Outcome:  Pt. Visit Completed   Christa See, MSW, [email or web address] Phone [phone] 5:07 PM

## 2022-10-05 DIAGNOSIS — E875 Hyperkalemia: Secondary | ICD-10-CM | POA: Diagnosis not present

## 2022-10-05 DIAGNOSIS — K769 Liver disease, unspecified: Secondary | ICD-10-CM | POA: Diagnosis not present

## 2022-10-05 DIAGNOSIS — Z23 Encounter for immunization: Secondary | ICD-10-CM | POA: Diagnosis not present

## 2022-10-05 DIAGNOSIS — D509 Iron deficiency anemia, unspecified: Secondary | ICD-10-CM | POA: Diagnosis not present

## 2022-10-05 DIAGNOSIS — Z79899 Other long term (current) drug therapy: Secondary | ICD-10-CM | POA: Diagnosis not present

## 2022-10-05 DIAGNOSIS — N2581 Secondary hyperparathyroidism of renal origin: Secondary | ICD-10-CM | POA: Diagnosis not present

## 2022-10-05 DIAGNOSIS — R82998 Other abnormal findings in urine: Secondary | ICD-10-CM | POA: Diagnosis not present

## 2022-10-05 DIAGNOSIS — N186 End stage renal disease: Secondary | ICD-10-CM | POA: Diagnosis not present

## 2022-10-05 DIAGNOSIS — D631 Anemia in chronic kidney disease: Secondary | ICD-10-CM | POA: Diagnosis not present

## 2022-10-05 DIAGNOSIS — Z992 Dependence on renal dialysis: Secondary | ICD-10-CM | POA: Diagnosis not present

## 2022-10-06 DIAGNOSIS — K769 Liver disease, unspecified: Secondary | ICD-10-CM | POA: Diagnosis not present

## 2022-10-06 DIAGNOSIS — Z79899 Other long term (current) drug therapy: Secondary | ICD-10-CM | POA: Diagnosis not present

## 2022-10-06 DIAGNOSIS — Z992 Dependence on renal dialysis: Secondary | ICD-10-CM | POA: Diagnosis not present

## 2022-10-06 DIAGNOSIS — N2581 Secondary hyperparathyroidism of renal origin: Secondary | ICD-10-CM | POA: Diagnosis not present

## 2022-10-06 DIAGNOSIS — N186 End stage renal disease: Secondary | ICD-10-CM | POA: Diagnosis not present

## 2022-10-06 DIAGNOSIS — E875 Hyperkalemia: Secondary | ICD-10-CM | POA: Diagnosis not present

## 2022-10-06 DIAGNOSIS — R82998 Other abnormal findings in urine: Secondary | ICD-10-CM | POA: Diagnosis not present

## 2022-10-06 DIAGNOSIS — D631 Anemia in chronic kidney disease: Secondary | ICD-10-CM | POA: Diagnosis not present

## 2022-10-06 DIAGNOSIS — Z23 Encounter for immunization: Secondary | ICD-10-CM | POA: Diagnosis not present

## 2022-10-06 DIAGNOSIS — D509 Iron deficiency anemia, unspecified: Secondary | ICD-10-CM | POA: Diagnosis not present

## 2022-10-07 DIAGNOSIS — Z79899 Other long term (current) drug therapy: Secondary | ICD-10-CM | POA: Diagnosis not present

## 2022-10-07 DIAGNOSIS — H2512 Age-related nuclear cataract, left eye: Secondary | ICD-10-CM | POA: Diagnosis not present

## 2022-10-07 DIAGNOSIS — E875 Hyperkalemia: Secondary | ICD-10-CM | POA: Diagnosis not present

## 2022-10-07 DIAGNOSIS — Z23 Encounter for immunization: Secondary | ICD-10-CM | POA: Diagnosis not present

## 2022-10-07 DIAGNOSIS — K769 Liver disease, unspecified: Secondary | ICD-10-CM | POA: Diagnosis not present

## 2022-10-07 DIAGNOSIS — N2581 Secondary hyperparathyroidism of renal origin: Secondary | ICD-10-CM | POA: Diagnosis not present

## 2022-10-07 DIAGNOSIS — N186 End stage renal disease: Secondary | ICD-10-CM | POA: Diagnosis not present

## 2022-10-07 DIAGNOSIS — R82998 Other abnormal findings in urine: Secondary | ICD-10-CM | POA: Diagnosis not present

## 2022-10-07 DIAGNOSIS — Z992 Dependence on renal dialysis: Secondary | ICD-10-CM | POA: Diagnosis not present

## 2022-10-07 DIAGNOSIS — D631 Anemia in chronic kidney disease: Secondary | ICD-10-CM | POA: Diagnosis not present

## 2022-10-07 DIAGNOSIS — D509 Iron deficiency anemia, unspecified: Secondary | ICD-10-CM | POA: Diagnosis not present

## 2022-10-08 ENCOUNTER — Encounter: Payer: Self-pay | Admitting: Ophthalmology

## 2022-10-08 ENCOUNTER — Ambulatory Visit: Payer: Self-pay

## 2022-10-08 DIAGNOSIS — Z79899 Other long term (current) drug therapy: Secondary | ICD-10-CM | POA: Diagnosis not present

## 2022-10-08 DIAGNOSIS — N186 End stage renal disease: Secondary | ICD-10-CM | POA: Diagnosis not present

## 2022-10-08 DIAGNOSIS — R82998 Other abnormal findings in urine: Secondary | ICD-10-CM | POA: Diagnosis not present

## 2022-10-08 DIAGNOSIS — D509 Iron deficiency anemia, unspecified: Secondary | ICD-10-CM | POA: Diagnosis not present

## 2022-10-08 DIAGNOSIS — K769 Liver disease, unspecified: Secondary | ICD-10-CM | POA: Diagnosis not present

## 2022-10-08 DIAGNOSIS — E875 Hyperkalemia: Secondary | ICD-10-CM | POA: Diagnosis not present

## 2022-10-08 DIAGNOSIS — N2581 Secondary hyperparathyroidism of renal origin: Secondary | ICD-10-CM | POA: Diagnosis not present

## 2022-10-08 DIAGNOSIS — Z23 Encounter for immunization: Secondary | ICD-10-CM | POA: Diagnosis not present

## 2022-10-08 DIAGNOSIS — Z992 Dependence on renal dialysis: Secondary | ICD-10-CM | POA: Diagnosis not present

## 2022-10-08 DIAGNOSIS — D631 Anemia in chronic kidney disease: Secondary | ICD-10-CM | POA: Diagnosis not present

## 2022-10-08 NOTE — Patient Outreach (Signed)
  Care Coordination   Follow Up Visit Note   10/08/2022 Name: Bobetta Korf MRN: [id number] DOB: [birth year]  Felicia Thompson is a 76 y.o. year old female who sees Isaac Bliss, Rayford Halsted, MD for primary care. I  collaborated with resources  What matters to the patients health and wellness today?  Transportation to upcoming procedure    Goals Addressed             This Visit's Progress    Care Coordination Activities       Care Coordination Interventions: Collaboration with [employer] to follow up on para transit application status - patient application not yet approved Collaboration with LCSW to advise - BSW to continue to follow         SDOH assessments and interventions completed:  No     Care Coordination Interventions Activated:  Yes  Care Coordination Interventions:  Yes, provided   Follow up plan:  SW will continue to follow    Encounter Outcome:  Pt. Visit Completed   Daneen Schick, Arita Miss, CDP Social Worker, Certified Dementia Practitioner [place] Coordination [phone]

## 2022-10-09 ENCOUNTER — Ambulatory Visit: Payer: Self-pay

## 2022-10-09 DIAGNOSIS — R82998 Other abnormal findings in urine: Secondary | ICD-10-CM | POA: Diagnosis not present

## 2022-10-09 DIAGNOSIS — Z79899 Other long term (current) drug therapy: Secondary | ICD-10-CM | POA: Diagnosis not present

## 2022-10-09 DIAGNOSIS — N186 End stage renal disease: Secondary | ICD-10-CM | POA: Diagnosis not present

## 2022-10-09 DIAGNOSIS — Z23 Encounter for immunization: Secondary | ICD-10-CM | POA: Diagnosis not present

## 2022-10-09 DIAGNOSIS — K769 Liver disease, unspecified: Secondary | ICD-10-CM | POA: Diagnosis not present

## 2022-10-09 DIAGNOSIS — D631 Anemia in chronic kidney disease: Secondary | ICD-10-CM | POA: Diagnosis not present

## 2022-10-09 DIAGNOSIS — N2581 Secondary hyperparathyroidism of renal origin: Secondary | ICD-10-CM | POA: Diagnosis not present

## 2022-10-09 DIAGNOSIS — E875 Hyperkalemia: Secondary | ICD-10-CM | POA: Diagnosis not present

## 2022-10-09 DIAGNOSIS — Z992 Dependence on renal dialysis: Secondary | ICD-10-CM | POA: Diagnosis not present

## 2022-10-09 DIAGNOSIS — D509 Iron deficiency anemia, unspecified: Secondary | ICD-10-CM | POA: Diagnosis not present

## 2022-10-09 NOTE — Patient Instructions (Signed)
Visit Information  Thank you for taking time to visit with me today. Please don't hesitate to contact me if I can be of assistance to you.   Following are the goals we discussed today:   Goals Addressed             This Visit's Progress    Care Coordination Activities       Care Coordination Interventions: Collaboration with [place] Director to review patient transportation needs; transportation to be arranged via [facility] transportation Contacted the patient to review appointment dates and times. 11/15 - cataract surgery  at [place] 11/16 11am follow up at [facility] 11/29 - cataract surgery at [place] 11/30 - 9:45 am follow up at [facility] Discussed the patient is unaware of surgery times as she was advised she would be contacted the day before the procedure with appointment time Reviewed plan for SW to contact [place] to obtain appointment time in order to book transportation services Spoke with Maudie Mercury at [facility] who reports the schedule will be made on Monday 11/13 - SW to follow up to confirm time on 11/13 Confirmed with patient her daughter and son-in-law will be available to provide support with help at home and picking up medications. They are unable to assist with transportation due to work schedules SW to continue to follow         If you are experiencing a [place] or [place] or need someone to talk to, please call 1-800-273-TALK (toll free, 24 hour hotline)  Patient verbalizes understanding of instructions and care plan provided today and agrees to view in [place]. Active MyChart status and patient understanding of how to access instructions and care plan via MyChart confirmed with patient.     I will follow up with you next week to confirm transportation pick up times.  Daneen Schick, BSW, CDP Social Worker, Certified Dementia Practitioner [place] Management   Care Coordination [phone]

## 2022-10-09 NOTE — Patient Outreach (Signed)
  Care Coordination   Follow Up Visit Note   10/09/2022 Name: Felicia Thompson MRN: [id number] DOB: 09-07-1946  Tabor Bartram is a 76 y.o. year old female who sees Isaac Bliss, Rayford Halsted, MD for primary care. I spoke with  [name] by phone today.  What matters to the patients health and wellness today?  Transportation to upcoming appointments    Goals Addressed             This Visit's Progress    Care Coordination Activities       Care Coordination Interventions: Collaboration with [place] Director to review patient transportation needs; transportation to be arranged via [facility] transportation Contacted the patient to review appointment dates and times. 11/15 - cataract surgery  at [place] 11/16 11am follow up at [facility] 11/29 - cataract surgery at [place] 11/30 - 9:45 am follow up at [facility] Discussed the patient is unaware of surgery times as she was advised she would be contacted the day before the procedure with appointment time Reviewed plan for SW to contact [facility] to obtain appointment time in order to book transportation services Spoke with Maudie Mercury at [facility] who reports the schedule will be made on Monday 11/13 - SW to follow up to confirm time on 11/13 Confirmed with patient her daughter and son-in-law will be available to provide support with help at home and picking up medications. They are unable to assist with transportation due to work schedules SW to continue to follow         SDOH assessments and interventions completed:  No     Care Coordination Interventions Activated:  Yes  Care Coordination Interventions:  Yes, provided   Follow up plan:  SW will continue to follow    Encounter Outcome:  Pt. Visit Completed   Daneen Schick, Arita Miss, CDP Social Worker, Certified Dementia Practitioner [facility] Care Management  Care  Coordination [phone]

## 2022-10-10 DIAGNOSIS — Z992 Dependence on renal dialysis: Secondary | ICD-10-CM | POA: Diagnosis not present

## 2022-10-10 DIAGNOSIS — D509 Iron deficiency anemia, unspecified: Secondary | ICD-10-CM | POA: Diagnosis not present

## 2022-10-10 DIAGNOSIS — N2581 Secondary hyperparathyroidism of renal origin: Secondary | ICD-10-CM | POA: Diagnosis not present

## 2022-10-10 DIAGNOSIS — E875 Hyperkalemia: Secondary | ICD-10-CM | POA: Diagnosis not present

## 2022-10-10 DIAGNOSIS — Z23 Encounter for immunization: Secondary | ICD-10-CM | POA: Diagnosis not present

## 2022-10-10 DIAGNOSIS — K769 Liver disease, unspecified: Secondary | ICD-10-CM | POA: Diagnosis not present

## 2022-10-10 DIAGNOSIS — D631 Anemia in chronic kidney disease: Secondary | ICD-10-CM | POA: Diagnosis not present

## 2022-10-10 DIAGNOSIS — R82998 Other abnormal findings in urine: Secondary | ICD-10-CM | POA: Diagnosis not present

## 2022-10-10 DIAGNOSIS — Z79899 Other long term (current) drug therapy: Secondary | ICD-10-CM | POA: Diagnosis not present

## 2022-10-10 DIAGNOSIS — N186 End stage renal disease: Secondary | ICD-10-CM | POA: Diagnosis not present

## 2022-10-12 DIAGNOSIS — K769 Liver disease, unspecified: Secondary | ICD-10-CM | POA: Diagnosis not present

## 2022-10-12 DIAGNOSIS — R82998 Other abnormal findings in urine: Secondary | ICD-10-CM | POA: Diagnosis not present

## 2022-10-12 DIAGNOSIS — Z79899 Other long term (current) drug therapy: Secondary | ICD-10-CM | POA: Diagnosis not present

## 2022-10-12 DIAGNOSIS — N2581 Secondary hyperparathyroidism of renal origin: Secondary | ICD-10-CM | POA: Diagnosis not present

## 2022-10-12 DIAGNOSIS — N186 End stage renal disease: Secondary | ICD-10-CM | POA: Diagnosis not present

## 2022-10-12 DIAGNOSIS — D509 Iron deficiency anemia, unspecified: Secondary | ICD-10-CM | POA: Diagnosis not present

## 2022-10-12 DIAGNOSIS — Z992 Dependence on renal dialysis: Secondary | ICD-10-CM | POA: Diagnosis not present

## 2022-10-12 DIAGNOSIS — E875 Hyperkalemia: Secondary | ICD-10-CM | POA: Diagnosis not present

## 2022-10-12 DIAGNOSIS — Z23 Encounter for immunization: Secondary | ICD-10-CM | POA: Diagnosis not present

## 2022-10-12 DIAGNOSIS — D631 Anemia in chronic kidney disease: Secondary | ICD-10-CM | POA: Diagnosis not present

## 2022-10-13 DIAGNOSIS — Z992 Dependence on renal dialysis: Secondary | ICD-10-CM | POA: Diagnosis not present

## 2022-10-13 DIAGNOSIS — N186 End stage renal disease: Secondary | ICD-10-CM | POA: Diagnosis not present

## 2022-10-13 DIAGNOSIS — Z79899 Other long term (current) drug therapy: Secondary | ICD-10-CM | POA: Diagnosis not present

## 2022-10-13 DIAGNOSIS — K769 Liver disease, unspecified: Secondary | ICD-10-CM | POA: Diagnosis not present

## 2022-10-13 DIAGNOSIS — N2581 Secondary hyperparathyroidism of renal origin: Secondary | ICD-10-CM | POA: Diagnosis not present

## 2022-10-13 DIAGNOSIS — E875 Hyperkalemia: Secondary | ICD-10-CM | POA: Diagnosis not present

## 2022-10-13 DIAGNOSIS — D509 Iron deficiency anemia, unspecified: Secondary | ICD-10-CM | POA: Diagnosis not present

## 2022-10-13 DIAGNOSIS — Z23 Encounter for immunization: Secondary | ICD-10-CM | POA: Diagnosis not present

## 2022-10-13 DIAGNOSIS — R82998 Other abnormal findings in urine: Secondary | ICD-10-CM | POA: Diagnosis not present

## 2022-10-13 DIAGNOSIS — D631 Anemia in chronic kidney disease: Secondary | ICD-10-CM | POA: Diagnosis not present

## 2022-10-14 ENCOUNTER — Ambulatory Visit: Payer: Self-pay

## 2022-10-14 DIAGNOSIS — Z992 Dependence on renal dialysis: Secondary | ICD-10-CM | POA: Diagnosis not present

## 2022-10-14 DIAGNOSIS — N186 End stage renal disease: Secondary | ICD-10-CM | POA: Diagnosis not present

## 2022-10-14 DIAGNOSIS — Z23 Encounter for immunization: Secondary | ICD-10-CM | POA: Diagnosis not present

## 2022-10-14 DIAGNOSIS — N2581 Secondary hyperparathyroidism of renal origin: Secondary | ICD-10-CM | POA: Diagnosis not present

## 2022-10-14 DIAGNOSIS — D509 Iron deficiency anemia, unspecified: Secondary | ICD-10-CM | POA: Diagnosis not present

## 2022-10-14 DIAGNOSIS — R82998 Other abnormal findings in urine: Secondary | ICD-10-CM | POA: Diagnosis not present

## 2022-10-14 DIAGNOSIS — K769 Liver disease, unspecified: Secondary | ICD-10-CM | POA: Diagnosis not present

## 2022-10-14 DIAGNOSIS — D631 Anemia in chronic kidney disease: Secondary | ICD-10-CM | POA: Diagnosis not present

## 2022-10-14 DIAGNOSIS — E875 Hyperkalemia: Secondary | ICD-10-CM | POA: Diagnosis not present

## 2022-10-14 DIAGNOSIS — Z79899 Other long term (current) drug therapy: Secondary | ICD-10-CM | POA: Diagnosis not present

## 2022-10-14 NOTE — Patient Outreach (Signed)
  Care Coordination   Follow Up Visit Note   10/14/2022 Name: Felicia Thompson MRN: [id number] DOB: 25-Nov-1946  Felicia Thompson is a 76 y.o. year old female who sees Felicia Thompson, Felicia Halsted, MD for primary care. I spoke with  Felicia Thompson by phone today.  What matters to the patients health and wellness today?  Transportation Resources    Goals Addressed             This Visit's Progress    Care Coordination Activities       Care Coordination Interventions: Determined patients cataract surgery has been rescheduled to January 2024 Collaboration with [employer] transit to determine they are unsure if patient lives within their service area - requested feedback if patient will qualify for services of if BSW needs to follow up with [employer] for transportation needs SW to continue to follow         SDOH assessments and interventions completed:  No     Care Coordination Interventions Activated:  Yes  Care Coordination Interventions:  Yes, provided   Follow up plan:  SW will continue to follow    Encounter Outcome:  Pt. Visit Completed   Daneen Schick, Arita Miss, CDP Social Worker, Certified Dementia Practitioner [facility] Care Management  Care Coordination [phone]

## 2022-10-14 NOTE — Patient Instructions (Signed)
Visit Information  Thank you for taking time to visit with me today. Please don't hesitate to contact me if I can be of assistance to you.   Following are the goals we discussed today:   Goals Addressed             This Visit's Progress    Care Coordination Activities       Care Coordination Interventions: Determined patients cataract surgery has been rescheduled to January 2024 Collaboration with [employer] transit to determine they are unsure if patient lives within their service area - requested feedback if patient will qualify for services of if BSW needs to follow up with [employer] for transportation needs SW to continue to follow          If you are experiencing a Mental Health or [place] or need someone to talk to, please call 1-800-273-TALK (toll free, 24 hour hotline)  Patient verbalizes understanding of instructions and care plan provided today and agrees to view in [place]. Active MyChart status and patient understanding of how to access instructions and care plan via MyChart confirmed with patient.     The care management team will reach out to the patient again over the next 45 days.   Daneen Schick, BSW, CDP Social Worker, Certified Dementia Practitioner [place] Management  Care Coordination [phone]

## 2022-10-15 DIAGNOSIS — Z23 Encounter for immunization: Secondary | ICD-10-CM | POA: Diagnosis not present

## 2022-10-15 DIAGNOSIS — N2581 Secondary hyperparathyroidism of renal origin: Secondary | ICD-10-CM | POA: Diagnosis not present

## 2022-10-15 DIAGNOSIS — D631 Anemia in chronic kidney disease: Secondary | ICD-10-CM | POA: Diagnosis not present

## 2022-10-15 DIAGNOSIS — E875 Hyperkalemia: Secondary | ICD-10-CM | POA: Diagnosis not present

## 2022-10-15 DIAGNOSIS — R82998 Other abnormal findings in urine: Secondary | ICD-10-CM | POA: Diagnosis not present

## 2022-10-15 DIAGNOSIS — Z992 Dependence on renal dialysis: Secondary | ICD-10-CM | POA: Diagnosis not present

## 2022-10-15 DIAGNOSIS — N186 End stage renal disease: Secondary | ICD-10-CM | POA: Diagnosis not present

## 2022-10-15 DIAGNOSIS — K769 Liver disease, unspecified: Secondary | ICD-10-CM | POA: Diagnosis not present

## 2022-10-15 DIAGNOSIS — D509 Iron deficiency anemia, unspecified: Secondary | ICD-10-CM | POA: Diagnosis not present

## 2022-10-15 DIAGNOSIS — Z79899 Other long term (current) drug therapy: Secondary | ICD-10-CM | POA: Diagnosis not present

## 2022-10-16 DIAGNOSIS — E875 Hyperkalemia: Secondary | ICD-10-CM | POA: Diagnosis not present

## 2022-10-16 DIAGNOSIS — N2581 Secondary hyperparathyroidism of renal origin: Secondary | ICD-10-CM | POA: Diagnosis not present

## 2022-10-16 DIAGNOSIS — K769 Liver disease, unspecified: Secondary | ICD-10-CM | POA: Diagnosis not present

## 2022-10-16 DIAGNOSIS — Z992 Dependence on renal dialysis: Secondary | ICD-10-CM | POA: Diagnosis not present

## 2022-10-16 DIAGNOSIS — N186 End stage renal disease: Secondary | ICD-10-CM | POA: Diagnosis not present

## 2022-10-16 DIAGNOSIS — R82998 Other abnormal findings in urine: Secondary | ICD-10-CM | POA: Diagnosis not present

## 2022-10-16 DIAGNOSIS — Z23 Encounter for immunization: Secondary | ICD-10-CM | POA: Diagnosis not present

## 2022-10-16 DIAGNOSIS — D631 Anemia in chronic kidney disease: Secondary | ICD-10-CM | POA: Diagnosis not present

## 2022-10-16 DIAGNOSIS — Z79899 Other long term (current) drug therapy: Secondary | ICD-10-CM | POA: Diagnosis not present

## 2022-10-16 DIAGNOSIS — D509 Iron deficiency anemia, unspecified: Secondary | ICD-10-CM | POA: Diagnosis not present

## 2022-10-17 DIAGNOSIS — K769 Liver disease, unspecified: Secondary | ICD-10-CM | POA: Diagnosis not present

## 2022-10-17 DIAGNOSIS — N2581 Secondary hyperparathyroidism of renal origin: Secondary | ICD-10-CM | POA: Diagnosis not present

## 2022-10-17 DIAGNOSIS — E875 Hyperkalemia: Secondary | ICD-10-CM | POA: Diagnosis not present

## 2022-10-17 DIAGNOSIS — R82998 Other abnormal findings in urine: Secondary | ICD-10-CM | POA: Diagnosis not present

## 2022-10-17 DIAGNOSIS — D631 Anemia in chronic kidney disease: Secondary | ICD-10-CM | POA: Diagnosis not present

## 2022-10-17 DIAGNOSIS — Z79899 Other long term (current) drug therapy: Secondary | ICD-10-CM | POA: Diagnosis not present

## 2022-10-17 DIAGNOSIS — N186 End stage renal disease: Secondary | ICD-10-CM | POA: Diagnosis not present

## 2022-10-17 DIAGNOSIS — D509 Iron deficiency anemia, unspecified: Secondary | ICD-10-CM | POA: Diagnosis not present

## 2022-10-17 DIAGNOSIS — Z23 Encounter for immunization: Secondary | ICD-10-CM | POA: Diagnosis not present

## 2022-10-17 DIAGNOSIS — Z992 Dependence on renal dialysis: Secondary | ICD-10-CM | POA: Diagnosis not present

## 2022-10-18 DIAGNOSIS — Z23 Encounter for immunization: Secondary | ICD-10-CM | POA: Diagnosis not present

## 2022-10-18 DIAGNOSIS — N2581 Secondary hyperparathyroidism of renal origin: Secondary | ICD-10-CM | POA: Diagnosis not present

## 2022-10-18 DIAGNOSIS — R82998 Other abnormal findings in urine: Secondary | ICD-10-CM | POA: Diagnosis not present

## 2022-10-18 DIAGNOSIS — D509 Iron deficiency anemia, unspecified: Secondary | ICD-10-CM | POA: Diagnosis not present

## 2022-10-18 DIAGNOSIS — D631 Anemia in chronic kidney disease: Secondary | ICD-10-CM | POA: Diagnosis not present

## 2022-10-18 DIAGNOSIS — Z992 Dependence on renal dialysis: Secondary | ICD-10-CM | POA: Diagnosis not present

## 2022-10-18 DIAGNOSIS — Z79899 Other long term (current) drug therapy: Secondary | ICD-10-CM | POA: Diagnosis not present

## 2022-10-18 DIAGNOSIS — N186 End stage renal disease: Secondary | ICD-10-CM | POA: Diagnosis not present

## 2022-10-18 DIAGNOSIS — K769 Liver disease, unspecified: Secondary | ICD-10-CM | POA: Diagnosis not present

## 2022-10-18 DIAGNOSIS — E875 Hyperkalemia: Secondary | ICD-10-CM | POA: Diagnosis not present

## 2022-10-19 DIAGNOSIS — Z992 Dependence on renal dialysis: Secondary | ICD-10-CM | POA: Diagnosis not present

## 2022-10-19 DIAGNOSIS — D509 Iron deficiency anemia, unspecified: Secondary | ICD-10-CM | POA: Diagnosis not present

## 2022-10-19 DIAGNOSIS — R82998 Other abnormal findings in urine: Secondary | ICD-10-CM | POA: Diagnosis not present

## 2022-10-19 DIAGNOSIS — E875 Hyperkalemia: Secondary | ICD-10-CM | POA: Diagnosis not present

## 2022-10-19 DIAGNOSIS — N186 End stage renal disease: Secondary | ICD-10-CM | POA: Diagnosis not present

## 2022-10-19 DIAGNOSIS — Z23 Encounter for immunization: Secondary | ICD-10-CM | POA: Diagnosis not present

## 2022-10-19 DIAGNOSIS — Z79899 Other long term (current) drug therapy: Secondary | ICD-10-CM | POA: Diagnosis not present

## 2022-10-19 DIAGNOSIS — N2581 Secondary hyperparathyroidism of renal origin: Secondary | ICD-10-CM | POA: Diagnosis not present

## 2022-10-19 DIAGNOSIS — K769 Liver disease, unspecified: Secondary | ICD-10-CM | POA: Diagnosis not present

## 2022-10-19 DIAGNOSIS — D631 Anemia in chronic kidney disease: Secondary | ICD-10-CM | POA: Diagnosis not present

## 2022-10-20 DIAGNOSIS — N186 End stage renal disease: Secondary | ICD-10-CM | POA: Diagnosis not present

## 2022-10-20 DIAGNOSIS — Z23 Encounter for immunization: Secondary | ICD-10-CM | POA: Diagnosis not present

## 2022-10-20 DIAGNOSIS — Z992 Dependence on renal dialysis: Secondary | ICD-10-CM | POA: Diagnosis not present

## 2022-10-20 DIAGNOSIS — E875 Hyperkalemia: Secondary | ICD-10-CM | POA: Diagnosis not present

## 2022-10-20 DIAGNOSIS — D631 Anemia in chronic kidney disease: Secondary | ICD-10-CM | POA: Diagnosis not present

## 2022-10-20 DIAGNOSIS — K769 Liver disease, unspecified: Secondary | ICD-10-CM | POA: Diagnosis not present

## 2022-10-20 DIAGNOSIS — R82998 Other abnormal findings in urine: Secondary | ICD-10-CM | POA: Diagnosis not present

## 2022-10-20 DIAGNOSIS — Z79899 Other long term (current) drug therapy: Secondary | ICD-10-CM | POA: Diagnosis not present

## 2022-10-20 DIAGNOSIS — N2581 Secondary hyperparathyroidism of renal origin: Secondary | ICD-10-CM | POA: Diagnosis not present

## 2022-10-20 DIAGNOSIS — D509 Iron deficiency anemia, unspecified: Secondary | ICD-10-CM | POA: Diagnosis not present

## 2022-10-21 DIAGNOSIS — Z992 Dependence on renal dialysis: Secondary | ICD-10-CM | POA: Diagnosis not present

## 2022-10-21 DIAGNOSIS — N2581 Secondary hyperparathyroidism of renal origin: Secondary | ICD-10-CM | POA: Diagnosis not present

## 2022-10-21 DIAGNOSIS — N186 End stage renal disease: Secondary | ICD-10-CM | POA: Diagnosis not present

## 2022-10-21 DIAGNOSIS — R82998 Other abnormal findings in urine: Secondary | ICD-10-CM | POA: Diagnosis not present

## 2022-10-21 DIAGNOSIS — E875 Hyperkalemia: Secondary | ICD-10-CM | POA: Diagnosis not present

## 2022-10-21 DIAGNOSIS — K769 Liver disease, unspecified: Secondary | ICD-10-CM | POA: Diagnosis not present

## 2022-10-21 DIAGNOSIS — Z79899 Other long term (current) drug therapy: Secondary | ICD-10-CM | POA: Diagnosis not present

## 2022-10-21 DIAGNOSIS — Z23 Encounter for immunization: Secondary | ICD-10-CM | POA: Diagnosis not present

## 2022-10-21 DIAGNOSIS — D631 Anemia in chronic kidney disease: Secondary | ICD-10-CM | POA: Diagnosis not present

## 2022-10-21 DIAGNOSIS — D509 Iron deficiency anemia, unspecified: Secondary | ICD-10-CM | POA: Diagnosis not present

## 2022-10-22 DIAGNOSIS — E875 Hyperkalemia: Secondary | ICD-10-CM | POA: Diagnosis not present

## 2022-10-22 DIAGNOSIS — Z992 Dependence on renal dialysis: Secondary | ICD-10-CM | POA: Diagnosis not present

## 2022-10-22 DIAGNOSIS — N186 End stage renal disease: Secondary | ICD-10-CM | POA: Diagnosis not present

## 2022-10-22 DIAGNOSIS — G4733 Obstructive sleep apnea (adult) (pediatric): Secondary | ICD-10-CM | POA: Diagnosis not present

## 2022-10-22 DIAGNOSIS — D509 Iron deficiency anemia, unspecified: Secondary | ICD-10-CM | POA: Diagnosis not present

## 2022-10-22 DIAGNOSIS — R82998 Other abnormal findings in urine: Secondary | ICD-10-CM | POA: Diagnosis not present

## 2022-10-22 DIAGNOSIS — D631 Anemia in chronic kidney disease: Secondary | ICD-10-CM | POA: Diagnosis not present

## 2022-10-22 DIAGNOSIS — K769 Liver disease, unspecified: Secondary | ICD-10-CM | POA: Diagnosis not present

## 2022-10-22 DIAGNOSIS — N2581 Secondary hyperparathyroidism of renal origin: Secondary | ICD-10-CM | POA: Diagnosis not present

## 2022-10-22 DIAGNOSIS — Z79899 Other long term (current) drug therapy: Secondary | ICD-10-CM | POA: Diagnosis not present

## 2022-10-22 DIAGNOSIS — Z23 Encounter for immunization: Secondary | ICD-10-CM | POA: Diagnosis not present

## 2022-10-23 DIAGNOSIS — Z23 Encounter for immunization: Secondary | ICD-10-CM | POA: Diagnosis not present

## 2022-10-23 DIAGNOSIS — K769 Liver disease, unspecified: Secondary | ICD-10-CM | POA: Diagnosis not present

## 2022-10-23 DIAGNOSIS — Z992 Dependence on renal dialysis: Secondary | ICD-10-CM | POA: Diagnosis not present

## 2022-10-23 DIAGNOSIS — Z79899 Other long term (current) drug therapy: Secondary | ICD-10-CM | POA: Diagnosis not present

## 2022-10-23 DIAGNOSIS — D509 Iron deficiency anemia, unspecified: Secondary | ICD-10-CM | POA: Diagnosis not present

## 2022-10-23 DIAGNOSIS — N186 End stage renal disease: Secondary | ICD-10-CM | POA: Diagnosis not present

## 2022-10-23 DIAGNOSIS — E875 Hyperkalemia: Secondary | ICD-10-CM | POA: Diagnosis not present

## 2022-10-23 DIAGNOSIS — R82998 Other abnormal findings in urine: Secondary | ICD-10-CM | POA: Diagnosis not present

## 2022-10-23 DIAGNOSIS — D631 Anemia in chronic kidney disease: Secondary | ICD-10-CM | POA: Diagnosis not present

## 2022-10-23 DIAGNOSIS — N2581 Secondary hyperparathyroidism of renal origin: Secondary | ICD-10-CM | POA: Diagnosis not present

## 2022-10-24 ENCOUNTER — Emergency Department
Admission: EM | Admit: 2022-10-24 | Discharge: 2022-10-24 | Disposition: A | Discharge status: Home / Self Care | Payer: Medicare Other | Attending: Emergency Medicine | Admitting: Emergency Medicine

## 2022-10-24 ENCOUNTER — Emergency Department: Payer: Medicare Other

## 2022-10-24 ENCOUNTER — Other Ambulatory Visit: Payer: Self-pay

## 2022-10-24 DIAGNOSIS — N186 End stage renal disease: Secondary | ICD-10-CM | POA: Diagnosis not present

## 2022-10-24 DIAGNOSIS — Z79899 Other long term (current) drug therapy: Secondary | ICD-10-CM | POA: Diagnosis not present

## 2022-10-24 DIAGNOSIS — H6690 Otitis media, unspecified, unspecified ear: Secondary | ICD-10-CM

## 2022-10-24 DIAGNOSIS — J020 Streptococcal pharyngitis: Secondary | ICD-10-CM | POA: Insufficient documentation

## 2022-10-24 DIAGNOSIS — D631 Anemia in chronic kidney disease: Secondary | ICD-10-CM | POA: Diagnosis not present

## 2022-10-24 DIAGNOSIS — R000 Tachycardia, unspecified: Secondary | ICD-10-CM | POA: Diagnosis not present

## 2022-10-24 DIAGNOSIS — Z20822 Contact with and (suspected) exposure to covid-19: Secondary | ICD-10-CM | POA: Insufficient documentation

## 2022-10-24 DIAGNOSIS — Z992 Dependence on renal dialysis: Secondary | ICD-10-CM | POA: Diagnosis not present

## 2022-10-24 DIAGNOSIS — D509 Iron deficiency anemia, unspecified: Secondary | ICD-10-CM | POA: Diagnosis not present

## 2022-10-24 DIAGNOSIS — R82998 Other abnormal findings in urine: Secondary | ICD-10-CM | POA: Diagnosis not present

## 2022-10-24 DIAGNOSIS — H6692 Otitis media, unspecified, left ear: Secondary | ICD-10-CM | POA: Diagnosis not present

## 2022-10-24 DIAGNOSIS — H66002 Acute suppurative otitis media without spontaneous rupture of ear drum, left ear: Secondary | ICD-10-CM | POA: Diagnosis not present

## 2022-10-24 DIAGNOSIS — R519 Headache, unspecified: Secondary | ICD-10-CM | POA: Diagnosis not present

## 2022-10-24 DIAGNOSIS — N2581 Secondary hyperparathyroidism of renal origin: Secondary | ICD-10-CM | POA: Diagnosis not present

## 2022-10-24 DIAGNOSIS — Z23 Encounter for immunization: Secondary | ICD-10-CM | POA: Diagnosis not present

## 2022-10-24 DIAGNOSIS — J019 Acute sinusitis, unspecified: Secondary | ICD-10-CM | POA: Diagnosis not present

## 2022-10-24 DIAGNOSIS — J029 Acute pharyngitis, unspecified: Secondary | ICD-10-CM | POA: Diagnosis present

## 2022-10-24 DIAGNOSIS — K769 Liver disease, unspecified: Secondary | ICD-10-CM | POA: Diagnosis not present

## 2022-10-24 DIAGNOSIS — J018 Other acute sinusitis: Secondary | ICD-10-CM | POA: Diagnosis not present

## 2022-10-24 DIAGNOSIS — E875 Hyperkalemia: Secondary | ICD-10-CM | POA: Diagnosis not present

## 2022-10-24 LAB — CBC WITH DIFFERENTIAL/PLATELET
Abs Immature Granulocytes: 0.07 10*3/uL (ref 0.00–0.07)
Basophils Absolute: 0.1 10*3/uL (ref 0.0–0.1)
Basophils Relative: 1 %
Eosinophils Absolute: 0.3 10*3/uL (ref 0.0–0.5)
Eosinophils Relative: 3 %
HCT: 24.5 % — ABNORMAL LOW (ref 36.0–46.0)
Hemoglobin: 8.6 g/dL — ABNORMAL LOW (ref 12.0–15.0)
Immature Granulocytes: 1 %
Lymphocytes Relative: 12 %
Lymphs Abs: 1.4 10*3/uL (ref 0.7–4.0)
MCH: 30.7 pg (ref 26.0–34.0)
MCHC: 35.1 g/dL (ref 30.0–36.0)
MCV: 87.5 fL (ref 80.0–100.0)
Monocytes Absolute: 0.8 10*3/uL (ref 0.1–1.0)
Monocytes Relative: 7 %
Neutro Abs: 9 10*3/uL — ABNORMAL HIGH (ref 1.7–7.7)
Neutrophils Relative %: 76 %
Platelets: 201 10*3/uL (ref 150–400)
RBC: 2.8 MIL/uL — ABNORMAL LOW (ref 3.87–5.11)
RDW: 13.3 % (ref 11.5–15.5)
WBC: 11.6 10*3/uL — ABNORMAL HIGH (ref 4.0–10.5)
nRBC: 0 % (ref 0.0–0.2)

## 2022-10-24 LAB — RESP PANEL BY RT-PCR (FLU A&B, COVID) ARPGX2
Influenza A by PCR: NEGATIVE
Influenza B by PCR: NEGATIVE
SARS Coronavirus 2 by RT PCR: NEGATIVE

## 2022-10-24 LAB — BASIC METABOLIC PANEL
Anion gap: 13 (ref 5–15)
BUN: 80 mg/dL — ABNORMAL HIGH (ref 8–23)
CO2: 24 mmol/L (ref 22–32)
Calcium: 8.3 mg/dL — ABNORMAL LOW (ref 8.9–10.3)
Chloride: 98 mmol/L (ref 98–111)
Creatinine, Ser: 11.61 mg/dL — ABNORMAL HIGH (ref 0.44–1.00)
GFR, Estimated: 3 mL/min — ABNORMAL LOW (ref >60)
Glucose, Bld: 187 mg/dL — ABNORMAL HIGH (ref 70–99)
Potassium: 3.9 mmol/L (ref 3.5–5.1)
Sodium: 135 mmol/L (ref 135–145)

## 2022-10-24 LAB — TROPONIN I (HIGH SENSITIVITY)
Troponin I (High Sensitivity): 39 ng/L — ABNORMAL HIGH (ref <18)
Troponin I (High Sensitivity): 40 ng/L — ABNORMAL HIGH (ref <18)

## 2022-10-24 LAB — GROUP A STREP BY PCR: Group A Strep by PCR: DETECTED — AB

## 2022-10-24 MED ORDER — AMOXICILLIN-POT CLAVULANATE 875-125 MG PO TABS
1.0000 | ORAL_TABLET | Freq: Once | ORAL | Status: AC
  Administered 2022-10-24: 1 via ORAL
  Filled 2022-10-24: qty 1

## 2022-10-24 MED ORDER — METOCLOPRAMIDE HCL 5 MG/ML IJ SOLN
10.0000 mg | Freq: Once | INTRAMUSCULAR | Status: AC
  Administered 2022-10-24: 10 mg via INTRAVENOUS
  Filled 2022-10-24: qty 2

## 2022-10-24 MED ORDER — OXYCODONE HCL 5 MG PO TABS: 5.0000 mg | ORAL_TABLET | Freq: Four times a day (QID) | ORAL | 0 refills | Status: AC | PRN

## 2022-10-24 MED ORDER — OXYCODONE HCL 5 MG PO TABS
5.0000 mg | ORAL_TABLET | Freq: Once | ORAL | Status: AC
  Administered 2022-10-24: 5 mg via ORAL
  Filled 2022-10-24: qty 1

## 2022-10-24 MED ORDER — AMLODIPINE BESYLATE 5 MG PO TABS
5.0000 mg | ORAL_TABLET | Freq: Once | ORAL | Status: AC
  Administered 2022-10-24: 5 mg via ORAL
  Filled 2022-10-24: qty 1

## 2022-10-24 MED ORDER — DIPHENHYDRAMINE HCL 50 MG/ML IJ SOLN
12.5000 mg | Freq: Once | INTRAMUSCULAR | Status: AC
  Administered 2022-10-24: 12.5 mg via INTRAVENOUS
  Filled 2022-10-24: qty 1

## 2022-10-24 MED ORDER — AMOXICILLIN-POT CLAVULANATE 875-125 MG PO TABS: 1.0000 | ORAL_TABLET | Freq: Two times a day (BID) | ORAL | 0 refills | Status: AC

## 2022-10-24 MED ORDER — CLONIDINE HCL 0.1 MG PO TABS
0.1000 mg | ORAL_TABLET | Freq: Once | ORAL | Status: AC
  Administered 2022-10-24: 0.1 mg via ORAL
  Filled 2022-10-24: qty 1

## 2022-10-24 MED ORDER — ACETAMINOPHEN 500 MG PO TABS
1000.0000 mg | ORAL_TABLET | Freq: Once | ORAL | Status: AC
  Administered 2022-10-24: 1000 mg via ORAL
  Filled 2022-10-24: qty 2

## 2022-10-24 MED ORDER — LOSARTAN POTASSIUM 50 MG PO TABS
50.0000 mg | ORAL_TABLET | Freq: Once | ORAL | Status: AC
  Administered 2022-10-24: 50 mg via ORAL
  Filled 2022-10-24: qty 1

## 2022-10-24 MED ORDER — CARVEDILOL 25 MG PO TABS
25.0000 mg | ORAL_TABLET | Freq: Once | ORAL | Status: AC
  Administered 2022-10-24: 25 mg via ORAL
  Filled 2022-10-24: qty 1

## 2022-10-24 MED ORDER — CARBAMIDE PEROXIDE 6.5 % OT SOLN
5.0000 [drp] | Freq: Once | OTIC | Status: AC
  Administered 2022-10-24: 5 [drp] via OTIC
  Filled 2022-10-24: qty 15

## 2022-10-24 MED ORDER — OFLOXACIN 0.3 % OT SOLN: 5.0000 [drp] | Freq: Two times a day (BID) | OTIC | 0 refills | Status: AC

## 2022-10-24 NOTE — ED Notes (Signed)
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  Pt also states that she did not take her BP medication this morning.

## 2022-10-24 NOTE — Discharge Instructions (Addendum)
We are starting you on antibiotic to take for 10 days as well as an eardrop to do for 10 days and some pain medication.  Follow-up with ENT.  If you do not hear from them please call them to make a appointment and return to the ER if she develops fevers worsening pain or any other concerns    Take oxycodone as prescribed. Do not drink alcohol, drive or participate in any other potentially dangerous activities while taking this medication as it may make you sleepy. Do not take this medication with any other sedating medications, either prescription or over-the-counter. If you were prescribed Percocet or Vicodin, do not take these with acetaminophen (Tylenol) as it is already contained within these medications.  This medication is an opiate (or narcotic) pain medication and can be habit forming. Use it as little as possible to achieve adequate pain control. Do not use or use it with extreme caution if you have a history of opiate abuse or dependence. If you are on a pain contract with your primary care doctor or a pain specialist, be sure to let them know you were prescribed this medication today from the Emergency Department. This medication is intended for your use only - do not give any to anyone else and keep it in a secure place where nobody else, especially children, have access to it.    IMPRESSION: 1. No acute intracranial abnormality. 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  2. New/increased left paranasal sinus inflammation since last year, and abnormal left tympanic cavity and mastoids detailed on Temporal Bone CT separately today.  1. Partial opacification of the left external auditory canal, the left tympanic cavity, and mastoids. Abnormally thickened TM. But no bone erosion or other complicating features. This may reflect a combination of infectious otitis externa/otitis media.

## 2022-10-24 NOTE — ED Provider Notes (Signed)
[facility] Provider Note    None    (approximate)   History   Ear Pain   HPI  Felicia Thompson is a 76 y.o. female with hypertension, peritoneal dialysis who comes in with concern for ear pain.  Patient reports having left ear pain.  Patient reports that the pain started overnight while she was just sitting there.  She reports the pain is in her left ear.  She denies any ringing of the ear.  She reports that she only did half of her dialysis due to the pain and having an issue with the dialysis machine but she states that it would work if she went back today.  She denies any chest pain, shortness of breath, abdominal pain, fevers.  Does report that her daughter has been sick as well and thinks she might have strep throat although patient herself denies any significant sore throat she did take Tylenol around midnight.  Physical Exam   Triage Vital Signs: ED Triage Vitals  Enc Vitals Group     BP 10/24/22 0802 (!) 208/87     Pulse Rate 10/24/22 0802 (!) 120     Resp 10/24/22 0802 16     Temp 10/24/22 0802 97.9 F (36.6 C)     Temp Source 10/24/22 0802 Oral     SpO2 10/24/22 0802 97 %     Weight --      Height --      Head Circumference --      Peak Flow --      Pain Score 10/24/22 0755 5     Pain Loc --      Pain Edu? --      Excl. in [place]? --     Most recent vital signs: Vitals:   10/24/22 0802  BP: (!) 208/87  Pulse: (!) 120  Resp: 16  Temp: 97.9 F (36.6 C)  SpO2: 97%     General: Awake, no distress.  CV:  Good peripheral perfusion.  Resp:  Normal effort.  Abd:  No distention.  Peritoneal dialysis in place.  Soft and nontender Other:  Patient has bilateral cerumen impaction noted.  No obvious redness on the outer ear or obvious signs of mastoiditis.  Oral pharynx is clear with uvula that is midline.  No fluctuation with the teeth to suggest underlying abscess.   ED Results / Procedures / Treatments   Labs (all labs ordered  are listed, but only abnormal results are displayed) Labs Reviewed  RESP PANEL BY RT-PCR (FLU A&B, COVID) [id number]  GROUP A STREP BY PCR  CBC WITH DIFFERENTIAL/PLATELET  BASIC METABOLIC PANEL  TROPONIN I (HIGH SENSITIVITY)     EKG  My interpretation of EKG:  Sinus tachycardia rate of 114 without any ST elevation or T wave inversions normal intervals  RADIOLOGY I have reviewed the CT head personally interpreted no evidence of intracranial hemorrhage    PROCEDURES:  Critical Care performed: No  .1-3 Lead EKG Interpretation  Performed by: Vanessa [place], MD Authorized by: Vanessa [place], MD     Interpretation: abnormal     ECG rate:  100   ECG rate assessment: tachycardic     Rhythm: sinus tachycardia     Ectopy: none     Conduction: normal      MEDICATIONS ORDERED IN ED: Medications  losartan (COZAAR) tablet 50 mg (50 mg Oral Given 10/24/22 0905)  amLODipine (NORVASC) tablet 5 mg (5 mg Oral Given 10/24/22 0905)  oxyCODONE (Oxy IR/ROXICODONE) immediate release tablet 5 mg (5 mg Oral Given 10/24/22 0905)  carbamide peroxide (DEBROX) 6.5 % OTIC (EAR) solution 5 drop (5 drops Both EARS Given 10/24/22 0915)  amoxicillin-clavulanate (AUGMENTIN) 875-125 MG per tablet 1 tablet (1 tablet Oral Given 10/24/22 1150)  metoCLOPramide (REGLAN) injection 10 mg (10 mg Intravenous Given 10/24/22 1152)  diphenhydrAMINE (BENADRYL) injection 12.5 mg (12.5 mg Intravenous Given 10/24/22 1153)  acetaminophen (TYLENOL) tablet 1,000 mg (1,000 mg Oral Given 10/24/22 1150)  cloNIDine (CATAPRES) tablet 0.1 mg (0.1 mg Oral Given 10/24/22 1150)  carvedilol (COREG) tablet 25 mg (25 mg Oral Given 10/24/22 1150)     IMPRESSION / MDM / ASSESSMENT AND PLAN / ED COURSE  I reviewed the triage vital signs and the nursing notes.   Patient's presentation is most consistent with acute presentation with potential threat to life or bodily function.   Patient comes in tachycardic, hypertensive, ESRD with  left ear pain.  Given patient's comorbidities and significant hypertension will get CT imaging evaluate for any type of intracranial hemorrhage, any deeper infections inside the ear such as mastoiditis although seems less likely based on examination.  Will try to irrigate the ear to see if and get a better look-with pain--I did discuss the risk of the including TM rupture, dizziness, vertigo  IMPRESSION: 1. No acute intracranial abnormality. 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  2. New/increased left paranasal sinus inflammation since last year, and abnormal left tympanic cavity and mastoids detailed on Temporal Bone CT separately today.  1. Partial opacification of the left external auditory canal, the left tympanic cavity, and mastoids. Abnormally thickened TM. But no bone erosion or other complicating features. This may reflect a combination of infectious otitis externa/otitis media.  I suspect the CT results are secondary to the cerumen causing the opacification of the left external canal but given the thickened TM membrane and the concern for positive strep as well as sinusitis we will give a 10-day course of Augmentin to fully treat possible sinusitis, ear infection, strep throat.  I rechecked a temperature 8 hours after Tylenol and she remains afebrile with normal white count she is not septic appearing from it but she is high risk for developing for worsening infection but at this time I feel like it would be reasonable to trial outpatient antibiotics.  I have ordered some troponins to ensure there is no signs of atypical ACS but given the above findings I feel like her pain is more related to this and her troponins are stable slightly elevated most likely from her missed dialysis and hypertension.  Her COVID test was negative.  Her hemoglobin is around baseline.  White count slightly elevated but does not meet sepsis criteria.  Her creatinine is  elevated at 11 and BUN is elevated so patient does need peritoneal dialysis tonight or an extra session today which she understands.  Her potassium however is reassuring.  On reassessment patient was still having some significant pain therefore a migraine cocktail was given and patient was also given her blood pressure medicine.  We discussed the risk of oxycodone but patient would like to proceed with this as well for at home to help with her pain.  Given the extent of pain  and pt is diabetic (although sugars well controlled on visits here) I did review the case with the ENT doctor Rockne Menghini and he reviewed the images and did not see anything else more worrisome based on the  CT. Agree with augmentin.    On reassessment patient's heart rates come down blood pressures are better patient is resting comfortably in bed.  They are trying to get out of here in order to go pick up her prescriptions at 2 before the pharmacy closes at this time they feel comfortable with discharge home.  The patient is on the cardiac monitor to evaluate for evidence of arrhythmia and/or significant heart rate changes.      FINAL CLINICAL IMPRESSION(S) / ED DIAGNOSES   Final diagnoses:  Acute otitis media, unspecified otitis media type  Strep pharyngitis  Acute sinusitis, recurrence not specified, unspecified location     Rx / DC Orders   ED Discharge Orders          Ordered    amoxicillin-clavulanate (AUGMENTIN) 875-125 MG tablet  2 times daily        10/24/22 1202    ofloxacin (FLOXIN) 0.3 % OTIC solution  2 times daily        10/24/22 1202    oxyCODONE (ROXICODONE) 5 MG immediate release tablet  Every 6 hours PRN        10/24/22 1216             Note:  This document was prepared using Dragon voice recognition software and may include unintentional dictation errors.   Vanessa [place], MD 10/24/22 1247

## 2022-10-24 NOTE — ED Triage Notes (Signed)
Pt to ED via POV c/o left ear pain.

## 2022-10-24 NOTE — Progress Notes (Signed)
I reviewed scan for patient and spoke to Dr Jari Pigg. Patient with DM and ESRD with acute left ear pain. No ear tenderness or redness. Cerumen impaction on exam.   CT reviewed. This shows opacification of the left middle ear space with thickening of TM. In the absence of other plausible explanations for patient's symptoms, left otitis media likely the cause of patient's pain. CT from 2022 does not show any sinusitis/otitis, suggesting that current finding is acute rather than chronic.  Agree with treatment with oral antibiotics. Will arrange for follow-up with [facility].

## 2022-10-25 DIAGNOSIS — N2581 Secondary hyperparathyroidism of renal origin: Secondary | ICD-10-CM | POA: Diagnosis not present

## 2022-10-25 DIAGNOSIS — Z992 Dependence on renal dialysis: Secondary | ICD-10-CM | POA: Diagnosis not present

## 2022-10-25 DIAGNOSIS — K769 Liver disease, unspecified: Secondary | ICD-10-CM | POA: Diagnosis not present

## 2022-10-25 DIAGNOSIS — Z79899 Other long term (current) drug therapy: Secondary | ICD-10-CM | POA: Diagnosis not present

## 2022-10-25 DIAGNOSIS — Z23 Encounter for immunization: Secondary | ICD-10-CM | POA: Diagnosis not present

## 2022-10-25 DIAGNOSIS — D631 Anemia in chronic kidney disease: Secondary | ICD-10-CM | POA: Diagnosis not present

## 2022-10-25 DIAGNOSIS — D509 Iron deficiency anemia, unspecified: Secondary | ICD-10-CM | POA: Diagnosis not present

## 2022-10-25 DIAGNOSIS — E875 Hyperkalemia: Secondary | ICD-10-CM | POA: Diagnosis not present

## 2022-10-25 DIAGNOSIS — R82998 Other abnormal findings in urine: Secondary | ICD-10-CM | POA: Diagnosis not present

## 2022-10-25 DIAGNOSIS — N186 End stage renal disease: Secondary | ICD-10-CM | POA: Diagnosis not present

## 2022-10-26 DIAGNOSIS — R82998 Other abnormal findings in urine: Secondary | ICD-10-CM | POA: Diagnosis not present

## 2022-10-26 DIAGNOSIS — Z23 Encounter for immunization: Secondary | ICD-10-CM | POA: Diagnosis not present

## 2022-10-26 DIAGNOSIS — D631 Anemia in chronic kidney disease: Secondary | ICD-10-CM | POA: Diagnosis not present

## 2022-10-26 DIAGNOSIS — Z992 Dependence on renal dialysis: Secondary | ICD-10-CM | POA: Diagnosis not present

## 2022-10-26 DIAGNOSIS — Z79899 Other long term (current) drug therapy: Secondary | ICD-10-CM | POA: Diagnosis not present

## 2022-10-26 DIAGNOSIS — N2581 Secondary hyperparathyroidism of renal origin: Secondary | ICD-10-CM | POA: Diagnosis not present

## 2022-10-26 DIAGNOSIS — D509 Iron deficiency anemia, unspecified: Secondary | ICD-10-CM | POA: Diagnosis not present

## 2022-10-26 DIAGNOSIS — E875 Hyperkalemia: Secondary | ICD-10-CM | POA: Diagnosis not present

## 2022-10-26 DIAGNOSIS — K769 Liver disease, unspecified: Secondary | ICD-10-CM | POA: Diagnosis not present

## 2022-10-26 DIAGNOSIS — N186 End stage renal disease: Secondary | ICD-10-CM | POA: Diagnosis not present

## 2022-10-27 DIAGNOSIS — N186 End stage renal disease: Secondary | ICD-10-CM | POA: Diagnosis not present

## 2022-10-27 DIAGNOSIS — D631 Anemia in chronic kidney disease: Secondary | ICD-10-CM | POA: Diagnosis not present

## 2022-10-27 DIAGNOSIS — Z79899 Other long term (current) drug therapy: Secondary | ICD-10-CM | POA: Diagnosis not present

## 2022-10-27 DIAGNOSIS — Z23 Encounter for immunization: Secondary | ICD-10-CM | POA: Diagnosis not present

## 2022-10-27 DIAGNOSIS — Z992 Dependence on renal dialysis: Secondary | ICD-10-CM | POA: Diagnosis not present

## 2022-10-27 DIAGNOSIS — N2581 Secondary hyperparathyroidism of renal origin: Secondary | ICD-10-CM | POA: Diagnosis not present

## 2022-10-27 DIAGNOSIS — K769 Liver disease, unspecified: Secondary | ICD-10-CM | POA: Diagnosis not present

## 2022-10-27 DIAGNOSIS — R82998 Other abnormal findings in urine: Secondary | ICD-10-CM | POA: Diagnosis not present

## 2022-10-27 DIAGNOSIS — D509 Iron deficiency anemia, unspecified: Secondary | ICD-10-CM | POA: Diagnosis not present

## 2022-10-27 DIAGNOSIS — E875 Hyperkalemia: Secondary | ICD-10-CM | POA: Diagnosis not present

## 2022-10-28 ENCOUNTER — Telehealth: Payer: Self-pay

## 2022-10-28 DIAGNOSIS — Z992 Dependence on renal dialysis: Secondary | ICD-10-CM | POA: Diagnosis not present

## 2022-10-28 DIAGNOSIS — K769 Liver disease, unspecified: Secondary | ICD-10-CM | POA: Diagnosis not present

## 2022-10-28 DIAGNOSIS — D631 Anemia in chronic kidney disease: Secondary | ICD-10-CM | POA: Diagnosis not present

## 2022-10-28 DIAGNOSIS — R82998 Other abnormal findings in urine: Secondary | ICD-10-CM | POA: Diagnosis not present

## 2022-10-28 DIAGNOSIS — E875 Hyperkalemia: Secondary | ICD-10-CM | POA: Diagnosis not present

## 2022-10-28 DIAGNOSIS — D509 Iron deficiency anemia, unspecified: Secondary | ICD-10-CM | POA: Diagnosis not present

## 2022-10-28 DIAGNOSIS — N186 End stage renal disease: Secondary | ICD-10-CM | POA: Diagnosis not present

## 2022-10-28 DIAGNOSIS — N2581 Secondary hyperparathyroidism of renal origin: Secondary | ICD-10-CM | POA: Diagnosis not present

## 2022-10-28 DIAGNOSIS — Z79899 Other long term (current) drug therapy: Secondary | ICD-10-CM | POA: Diagnosis not present

## 2022-10-28 DIAGNOSIS — Z23 Encounter for immunization: Secondary | ICD-10-CM | POA: Diagnosis not present

## 2022-10-28 NOTE — Telephone Encounter (Signed)
Transition Care Management Follow-up Telephone Call Date of discharge and from where: [facility] 10-24-22 Dx: acute otitis media  How have you been since you were released from the hospital? Not feeling well  Any questions or concerns? No  Items Reviewed: Did the pt receive and understand the discharge instructions provided? Yes  Medications obtained and verified? Yes  Other? No  Any new allergies since your discharge? No  Dietary orders reviewed? Yes Do you have support at home? Yes   Home Care and Equipment/Supplies: Were home health services ordered? no If so, what is the name of the agency? na  Has the agency set up a time to come to the patient's home? not applicable Were any new equipment or medical supplies ordered?  No What is the name of the medical supply agency? na Were you able to get the supplies/equipment? not applicable Do you have any questions related to the use of the equipment or supplies? No  Functional Questionnaire: (I = Independent and D = Dependent) ADLs: I  Bathing/Dressing- I  Meal Prep- I  Eating- I  Maintaining continence- I  Transferring/Ambulation- I  Managing Meds- I  Follow up appointments reviewed:  PCP Hospital f/u appt confirmed? No   Specialist Hospital f/u appt confirmed? Yes  PT waiting on appt with ENT for Fu . Are transportation arrangements needed? No  If their condition worsens, is the pt aware to call PCP or go to the Emergency Dept.? Yes Was the patient provided with contact information for the PCP's office or ED? Yes Was to pt encouraged to call back with questions or concerns? Yes   Juanda Crumble LPN [place] Direct Dial [phone]

## 2022-10-29 DIAGNOSIS — N2581 Secondary hyperparathyroidism of renal origin: Secondary | ICD-10-CM | POA: Diagnosis not present

## 2022-10-29 DIAGNOSIS — Z79899 Other long term (current) drug therapy: Secondary | ICD-10-CM | POA: Diagnosis not present

## 2022-10-29 DIAGNOSIS — D509 Iron deficiency anemia, unspecified: Secondary | ICD-10-CM | POA: Diagnosis not present

## 2022-10-29 DIAGNOSIS — K769 Liver disease, unspecified: Secondary | ICD-10-CM | POA: Diagnosis not present

## 2022-10-29 DIAGNOSIS — Z23 Encounter for immunization: Secondary | ICD-10-CM | POA: Diagnosis not present

## 2022-10-29 DIAGNOSIS — Z992 Dependence on renal dialysis: Secondary | ICD-10-CM | POA: Diagnosis not present

## 2022-10-29 DIAGNOSIS — N186 End stage renal disease: Secondary | ICD-10-CM | POA: Diagnosis not present

## 2022-10-29 DIAGNOSIS — R82998 Other abnormal findings in urine: Secondary | ICD-10-CM | POA: Diagnosis not present

## 2022-10-29 DIAGNOSIS — E875 Hyperkalemia: Secondary | ICD-10-CM | POA: Diagnosis not present

## 2022-10-29 DIAGNOSIS — D631 Anemia in chronic kidney disease: Secondary | ICD-10-CM | POA: Diagnosis not present

## 2022-10-30 DIAGNOSIS — R82998 Other abnormal findings in urine: Secondary | ICD-10-CM | POA: Diagnosis not present

## 2022-10-30 DIAGNOSIS — N2581 Secondary hyperparathyroidism of renal origin: Secondary | ICD-10-CM | POA: Diagnosis not present

## 2022-10-30 DIAGNOSIS — K769 Liver disease, unspecified: Secondary | ICD-10-CM | POA: Diagnosis not present

## 2022-10-30 DIAGNOSIS — Z79899 Other long term (current) drug therapy: Secondary | ICD-10-CM | POA: Diagnosis not present

## 2022-10-30 DIAGNOSIS — D509 Iron deficiency anemia, unspecified: Secondary | ICD-10-CM | POA: Diagnosis not present

## 2022-10-30 DIAGNOSIS — E875 Hyperkalemia: Secondary | ICD-10-CM | POA: Diagnosis not present

## 2022-10-30 DIAGNOSIS — Z23 Encounter for immunization: Secondary | ICD-10-CM | POA: Diagnosis not present

## 2022-10-30 DIAGNOSIS — N186 End stage renal disease: Secondary | ICD-10-CM | POA: Diagnosis not present

## 2022-10-30 DIAGNOSIS — D631 Anemia in chronic kidney disease: Secondary | ICD-10-CM | POA: Diagnosis not present

## 2022-10-30 DIAGNOSIS — Z992 Dependence on renal dialysis: Secondary | ICD-10-CM | POA: Diagnosis not present

## 2022-10-31 DIAGNOSIS — D509 Iron deficiency anemia, unspecified: Secondary | ICD-10-CM | POA: Diagnosis not present

## 2022-10-31 DIAGNOSIS — E875 Hyperkalemia: Secondary | ICD-10-CM | POA: Diagnosis not present

## 2022-10-31 DIAGNOSIS — N186 End stage renal disease: Secondary | ICD-10-CM | POA: Diagnosis not present

## 2022-10-31 DIAGNOSIS — N2581 Secondary hyperparathyroidism of renal origin: Secondary | ICD-10-CM | POA: Diagnosis not present

## 2022-10-31 DIAGNOSIS — D631 Anemia in chronic kidney disease: Secondary | ICD-10-CM | POA: Diagnosis not present

## 2022-10-31 DIAGNOSIS — R82998 Other abnormal findings in urine: Secondary | ICD-10-CM | POA: Diagnosis not present

## 2022-10-31 DIAGNOSIS — Z23 Encounter for immunization: Secondary | ICD-10-CM | POA: Diagnosis not present

## 2022-10-31 DIAGNOSIS — Z992 Dependence on renal dialysis: Secondary | ICD-10-CM | POA: Diagnosis not present

## 2022-10-31 DIAGNOSIS — K769 Liver disease, unspecified: Secondary | ICD-10-CM | POA: Diagnosis not present

## 2022-10-31 DIAGNOSIS — Z79899 Other long term (current) drug therapy: Secondary | ICD-10-CM | POA: Diagnosis not present

## 2022-11-01 DIAGNOSIS — R82998 Other abnormal findings in urine: Secondary | ICD-10-CM | POA: Diagnosis not present

## 2022-11-01 DIAGNOSIS — D631 Anemia in chronic kidney disease: Secondary | ICD-10-CM | POA: Diagnosis not present

## 2022-11-01 DIAGNOSIS — E1129 Type 2 diabetes mellitus with other diabetic kidney complication: Secondary | ICD-10-CM | POA: Diagnosis not present

## 2022-11-01 DIAGNOSIS — N2581 Secondary hyperparathyroidism of renal origin: Secondary | ICD-10-CM | POA: Diagnosis not present

## 2022-11-01 DIAGNOSIS — D509 Iron deficiency anemia, unspecified: Secondary | ICD-10-CM | POA: Diagnosis not present

## 2022-11-01 DIAGNOSIS — N186 End stage renal disease: Secondary | ICD-10-CM | POA: Diagnosis not present

## 2022-11-01 DIAGNOSIS — K769 Liver disease, unspecified: Secondary | ICD-10-CM | POA: Diagnosis not present

## 2022-11-01 DIAGNOSIS — Z992 Dependence on renal dialysis: Secondary | ICD-10-CM | POA: Diagnosis not present

## 2022-11-01 DIAGNOSIS — N2589 Other disorders resulting from impaired renal tubular function: Secondary | ICD-10-CM | POA: Diagnosis not present

## 2022-11-02 DIAGNOSIS — D631 Anemia in chronic kidney disease: Secondary | ICD-10-CM | POA: Diagnosis not present

## 2022-11-02 DIAGNOSIS — E1129 Type 2 diabetes mellitus with other diabetic kidney complication: Secondary | ICD-10-CM | POA: Diagnosis not present

## 2022-11-02 DIAGNOSIS — Z992 Dependence on renal dialysis: Secondary | ICD-10-CM | POA: Diagnosis not present

## 2022-11-02 DIAGNOSIS — K769 Liver disease, unspecified: Secondary | ICD-10-CM | POA: Diagnosis not present

## 2022-11-02 DIAGNOSIS — D509 Iron deficiency anemia, unspecified: Secondary | ICD-10-CM | POA: Diagnosis not present

## 2022-11-02 DIAGNOSIS — N2589 Other disorders resulting from impaired renal tubular function: Secondary | ICD-10-CM | POA: Diagnosis not present

## 2022-11-02 DIAGNOSIS — N2581 Secondary hyperparathyroidism of renal origin: Secondary | ICD-10-CM | POA: Diagnosis not present

## 2022-11-02 DIAGNOSIS — R82998 Other abnormal findings in urine: Secondary | ICD-10-CM | POA: Diagnosis not present

## 2022-11-02 DIAGNOSIS — N186 End stage renal disease: Secondary | ICD-10-CM | POA: Diagnosis not present

## 2022-11-03 DIAGNOSIS — D509 Iron deficiency anemia, unspecified: Secondary | ICD-10-CM | POA: Diagnosis not present

## 2022-11-03 DIAGNOSIS — N2581 Secondary hyperparathyroidism of renal origin: Secondary | ICD-10-CM | POA: Diagnosis not present

## 2022-11-03 DIAGNOSIS — K769 Liver disease, unspecified: Secondary | ICD-10-CM | POA: Diagnosis not present

## 2022-11-03 DIAGNOSIS — N2589 Other disorders resulting from impaired renal tubular function: Secondary | ICD-10-CM | POA: Diagnosis not present

## 2022-11-03 DIAGNOSIS — Z992 Dependence on renal dialysis: Secondary | ICD-10-CM | POA: Diagnosis not present

## 2022-11-03 DIAGNOSIS — N186 End stage renal disease: Secondary | ICD-10-CM | POA: Diagnosis not present

## 2022-11-03 DIAGNOSIS — E1129 Type 2 diabetes mellitus with other diabetic kidney complication: Secondary | ICD-10-CM | POA: Diagnosis not present

## 2022-11-03 DIAGNOSIS — D631 Anemia in chronic kidney disease: Secondary | ICD-10-CM | POA: Diagnosis not present

## 2022-11-03 DIAGNOSIS — R82998 Other abnormal findings in urine: Secondary | ICD-10-CM | POA: Diagnosis not present

## 2022-11-04 DIAGNOSIS — N2589 Other disorders resulting from impaired renal tubular function: Secondary | ICD-10-CM | POA: Diagnosis not present

## 2022-11-04 DIAGNOSIS — Z992 Dependence on renal dialysis: Secondary | ICD-10-CM | POA: Diagnosis not present

## 2022-11-04 DIAGNOSIS — E1129 Type 2 diabetes mellitus with other diabetic kidney complication: Secondary | ICD-10-CM | POA: Diagnosis not present

## 2022-11-04 DIAGNOSIS — D509 Iron deficiency anemia, unspecified: Secondary | ICD-10-CM | POA: Diagnosis not present

## 2022-11-04 DIAGNOSIS — N186 End stage renal disease: Secondary | ICD-10-CM | POA: Diagnosis not present

## 2022-11-04 DIAGNOSIS — N2581 Secondary hyperparathyroidism of renal origin: Secondary | ICD-10-CM | POA: Diagnosis not present

## 2022-11-04 DIAGNOSIS — K769 Liver disease, unspecified: Secondary | ICD-10-CM | POA: Diagnosis not present

## 2022-11-04 DIAGNOSIS — D631 Anemia in chronic kidney disease: Secondary | ICD-10-CM | POA: Diagnosis not present

## 2022-11-04 DIAGNOSIS — R82998 Other abnormal findings in urine: Secondary | ICD-10-CM | POA: Diagnosis not present

## 2022-11-05 DIAGNOSIS — N2581 Secondary hyperparathyroidism of renal origin: Secondary | ICD-10-CM | POA: Diagnosis not present

## 2022-11-05 DIAGNOSIS — N2589 Other disorders resulting from impaired renal tubular function: Secondary | ICD-10-CM | POA: Diagnosis not present

## 2022-11-05 DIAGNOSIS — K769 Liver disease, unspecified: Secondary | ICD-10-CM | POA: Diagnosis not present

## 2022-11-05 DIAGNOSIS — Z992 Dependence on renal dialysis: Secondary | ICD-10-CM | POA: Diagnosis not present

## 2022-11-05 DIAGNOSIS — D509 Iron deficiency anemia, unspecified: Secondary | ICD-10-CM | POA: Diagnosis not present

## 2022-11-05 DIAGNOSIS — N186 End stage renal disease: Secondary | ICD-10-CM | POA: Diagnosis not present

## 2022-11-05 DIAGNOSIS — R82998 Other abnormal findings in urine: Secondary | ICD-10-CM | POA: Diagnosis not present

## 2022-11-05 DIAGNOSIS — D631 Anemia in chronic kidney disease: Secondary | ICD-10-CM | POA: Diagnosis not present

## 2022-11-05 DIAGNOSIS — E1129 Type 2 diabetes mellitus with other diabetic kidney complication: Secondary | ICD-10-CM | POA: Diagnosis not present

## 2022-11-06 DIAGNOSIS — R82998 Other abnormal findings in urine: Secondary | ICD-10-CM | POA: Diagnosis not present

## 2022-11-06 DIAGNOSIS — K769 Liver disease, unspecified: Secondary | ICD-10-CM | POA: Diagnosis not present

## 2022-11-06 DIAGNOSIS — N186 End stage renal disease: Secondary | ICD-10-CM | POA: Diagnosis not present

## 2022-11-06 DIAGNOSIS — Z992 Dependence on renal dialysis: Secondary | ICD-10-CM | POA: Diagnosis not present

## 2022-11-06 DIAGNOSIS — E1129 Type 2 diabetes mellitus with other diabetic kidney complication: Secondary | ICD-10-CM | POA: Diagnosis not present

## 2022-11-06 DIAGNOSIS — D509 Iron deficiency anemia, unspecified: Secondary | ICD-10-CM | POA: Diagnosis not present

## 2022-11-06 DIAGNOSIS — D631 Anemia in chronic kidney disease: Secondary | ICD-10-CM | POA: Diagnosis not present

## 2022-11-06 DIAGNOSIS — N2589 Other disorders resulting from impaired renal tubular function: Secondary | ICD-10-CM | POA: Diagnosis not present

## 2022-11-06 DIAGNOSIS — N2581 Secondary hyperparathyroidism of renal origin: Secondary | ICD-10-CM | POA: Diagnosis not present

## 2022-11-07 DIAGNOSIS — D631 Anemia in chronic kidney disease: Secondary | ICD-10-CM | POA: Diagnosis not present

## 2022-11-07 DIAGNOSIS — N2581 Secondary hyperparathyroidism of renal origin: Secondary | ICD-10-CM | POA: Diagnosis not present

## 2022-11-07 DIAGNOSIS — E1129 Type 2 diabetes mellitus with other diabetic kidney complication: Secondary | ICD-10-CM | POA: Diagnosis not present

## 2022-11-07 DIAGNOSIS — D509 Iron deficiency anemia, unspecified: Secondary | ICD-10-CM | POA: Diagnosis not present

## 2022-11-07 DIAGNOSIS — Z992 Dependence on renal dialysis: Secondary | ICD-10-CM | POA: Diagnosis not present

## 2022-11-07 DIAGNOSIS — K769 Liver disease, unspecified: Secondary | ICD-10-CM | POA: Diagnosis not present

## 2022-11-07 DIAGNOSIS — N2589 Other disorders resulting from impaired renal tubular function: Secondary | ICD-10-CM | POA: Diagnosis not present

## 2022-11-07 DIAGNOSIS — R82998 Other abnormal findings in urine: Secondary | ICD-10-CM | POA: Diagnosis not present

## 2022-11-07 DIAGNOSIS — N186 End stage renal disease: Secondary | ICD-10-CM | POA: Diagnosis not present

## 2022-11-08 DIAGNOSIS — D509 Iron deficiency anemia, unspecified: Secondary | ICD-10-CM | POA: Diagnosis not present

## 2022-11-08 DIAGNOSIS — Z992 Dependence on renal dialysis: Secondary | ICD-10-CM | POA: Diagnosis not present

## 2022-11-08 DIAGNOSIS — D631 Anemia in chronic kidney disease: Secondary | ICD-10-CM | POA: Diagnosis not present

## 2022-11-08 DIAGNOSIS — E1129 Type 2 diabetes mellitus with other diabetic kidney complication: Secondary | ICD-10-CM | POA: Diagnosis not present

## 2022-11-08 DIAGNOSIS — N2581 Secondary hyperparathyroidism of renal origin: Secondary | ICD-10-CM | POA: Diagnosis not present

## 2022-11-08 DIAGNOSIS — N186 End stage renal disease: Secondary | ICD-10-CM | POA: Diagnosis not present

## 2022-11-08 DIAGNOSIS — N2589 Other disorders resulting from impaired renal tubular function: Secondary | ICD-10-CM | POA: Diagnosis not present

## 2022-11-08 DIAGNOSIS — K769 Liver disease, unspecified: Secondary | ICD-10-CM | POA: Diagnosis not present

## 2022-11-08 DIAGNOSIS — R82998 Other abnormal findings in urine: Secondary | ICD-10-CM | POA: Diagnosis not present

## 2022-11-09 DIAGNOSIS — K769 Liver disease, unspecified: Secondary | ICD-10-CM | POA: Diagnosis not present

## 2022-11-09 DIAGNOSIS — D509 Iron deficiency anemia, unspecified: Secondary | ICD-10-CM | POA: Diagnosis not present

## 2022-11-09 DIAGNOSIS — Z992 Dependence on renal dialysis: Secondary | ICD-10-CM | POA: Diagnosis not present

## 2022-11-09 DIAGNOSIS — N2589 Other disorders resulting from impaired renal tubular function: Secondary | ICD-10-CM | POA: Diagnosis not present

## 2022-11-09 DIAGNOSIS — E1129 Type 2 diabetes mellitus with other diabetic kidney complication: Secondary | ICD-10-CM | POA: Diagnosis not present

## 2022-11-09 DIAGNOSIS — N2581 Secondary hyperparathyroidism of renal origin: Secondary | ICD-10-CM | POA: Diagnosis not present

## 2022-11-09 DIAGNOSIS — N186 End stage renal disease: Secondary | ICD-10-CM | POA: Diagnosis not present

## 2022-11-09 DIAGNOSIS — R82998 Other abnormal findings in urine: Secondary | ICD-10-CM | POA: Diagnosis not present

## 2022-11-09 DIAGNOSIS — D631 Anemia in chronic kidney disease: Secondary | ICD-10-CM | POA: Diagnosis not present

## 2022-11-10 ENCOUNTER — Emergency Department: Payer: Medicare Other

## 2022-11-10 DIAGNOSIS — I10 Essential (primary) hypertension: Secondary | ICD-10-CM | POA: Diagnosis not present

## 2022-11-10 DIAGNOSIS — Z888 Allergy status to other drugs, medicaments and biological substances status: Secondary | ICD-10-CM | POA: Diagnosis not present

## 2022-11-10 DIAGNOSIS — R531 Weakness: Secondary | ICD-10-CM | POA: Diagnosis not present

## 2022-11-10 DIAGNOSIS — A419 Sepsis, unspecified organism: Secondary | ICD-10-CM | POA: Diagnosis not present

## 2022-11-10 DIAGNOSIS — G9341 Metabolic encephalopathy: Secondary | ICD-10-CM | POA: Diagnosis not present

## 2022-11-10 DIAGNOSIS — D509 Iron deficiency anemia, unspecified: Secondary | ICD-10-CM | POA: Diagnosis not present

## 2022-11-10 DIAGNOSIS — Z96643 Presence of artificial hip joint, bilateral: Secondary | ICD-10-CM | POA: Diagnosis not present

## 2022-11-10 DIAGNOSIS — M1990 Unspecified osteoarthritis, unspecified site: Secondary | ICD-10-CM | POA: Diagnosis present

## 2022-11-10 DIAGNOSIS — Z881 Allergy status to other antibiotic agents status: Secondary | ICD-10-CM

## 2022-11-10 DIAGNOSIS — Z7984 Long term (current) use of oral hypoglycemic drugs: Secondary | ICD-10-CM

## 2022-11-10 DIAGNOSIS — D631 Anemia in chronic kidney disease: Secondary | ICD-10-CM | POA: Diagnosis present

## 2022-11-10 DIAGNOSIS — E1165 Type 2 diabetes mellitus with hyperglycemia: Secondary | ICD-10-CM | POA: Diagnosis not present

## 2022-11-10 DIAGNOSIS — Z8249 Family history of ischemic heart disease and other diseases of the circulatory system: Secondary | ICD-10-CM | POA: Diagnosis not present

## 2022-11-10 DIAGNOSIS — E1122 Type 2 diabetes mellitus with diabetic chronic kidney disease: Secondary | ICD-10-CM | POA: Diagnosis not present

## 2022-11-10 DIAGNOSIS — E785 Hyperlipidemia, unspecified: Secondary | ICD-10-CM | POA: Diagnosis not present

## 2022-11-10 DIAGNOSIS — I120 Hypertensive chronic kidney disease with stage 5 chronic kidney disease or end stage renal disease: Secondary | ICD-10-CM | POA: Diagnosis present

## 2022-11-10 DIAGNOSIS — R710 Precipitous drop in hematocrit: Secondary | ICD-10-CM | POA: Diagnosis not present

## 2022-11-10 DIAGNOSIS — Z811 Family history of alcohol abuse and dependence: Secondary | ICD-10-CM

## 2022-11-10 DIAGNOSIS — G4733 Obstructive sleep apnea (adult) (pediatric): Secondary | ICD-10-CM | POA: Diagnosis not present

## 2022-11-10 DIAGNOSIS — N2581 Secondary hyperparathyroidism of renal origin: Secondary | ICD-10-CM | POA: Diagnosis present

## 2022-11-10 DIAGNOSIS — R112 Nausea with vomiting, unspecified: Secondary | ICD-10-CM | POA: Diagnosis present

## 2022-11-10 DIAGNOSIS — Z79899 Other long term (current) drug therapy: Secondary | ICD-10-CM

## 2022-11-10 DIAGNOSIS — Z809 Family history of malignant neoplasm, unspecified: Secondary | ICD-10-CM | POA: Diagnosis not present

## 2022-11-10 DIAGNOSIS — K769 Liver disease, unspecified: Secondary | ICD-10-CM | POA: Diagnosis not present

## 2022-11-10 DIAGNOSIS — N186 End stage renal disease: Secondary | ICD-10-CM | POA: Diagnosis not present

## 2022-11-10 DIAGNOSIS — Z822 Family history of deafness and hearing loss: Secondary | ICD-10-CM

## 2022-11-10 DIAGNOSIS — Z1152 Encounter for screening for COVID-19: Secondary | ICD-10-CM

## 2022-11-10 DIAGNOSIS — Z818 Family history of other mental and behavioral disorders: Secondary | ICD-10-CM

## 2022-11-10 DIAGNOSIS — E1129 Type 2 diabetes mellitus with other diabetic kidney complication: Secondary | ICD-10-CM | POA: Diagnosis not present

## 2022-11-10 DIAGNOSIS — R82998 Other abnormal findings in urine: Secondary | ICD-10-CM | POA: Diagnosis not present

## 2022-11-10 DIAGNOSIS — G9340 Encephalopathy, unspecified: Secondary | ICD-10-CM | POA: Diagnosis not present

## 2022-11-10 DIAGNOSIS — Z813 Family history of other psychoactive substance abuse and dependence: Secondary | ICD-10-CM

## 2022-11-10 DIAGNOSIS — Z833 Family history of diabetes mellitus: Secondary | ICD-10-CM | POA: Diagnosis not present

## 2022-11-10 DIAGNOSIS — R109 Unspecified abdominal pain: Secondary | ICD-10-CM | POA: Diagnosis not present

## 2022-11-10 DIAGNOSIS — K529 Noninfective gastroenteritis and colitis, unspecified: Principal | ICD-10-CM | POA: Diagnosis present

## 2022-11-10 DIAGNOSIS — I959 Hypotension, unspecified: Secondary | ICD-10-CM | POA: Diagnosis not present

## 2022-11-10 DIAGNOSIS — Z96653 Presence of artificial knee joint, bilateral: Secondary | ICD-10-CM | POA: Diagnosis not present

## 2022-11-10 DIAGNOSIS — J9811 Atelectasis: Secondary | ICD-10-CM | POA: Diagnosis not present

## 2022-11-10 DIAGNOSIS — Z7982 Long term (current) use of aspirin: Secondary | ICD-10-CM

## 2022-11-10 DIAGNOSIS — N2589 Other disorders resulting from impaired renal tubular function: Secondary | ICD-10-CM | POA: Diagnosis not present

## 2022-11-10 DIAGNOSIS — I700 Atherosclerosis of aorta: Secondary | ICD-10-CM | POA: Diagnosis not present

## 2022-11-10 DIAGNOSIS — R059 Cough, unspecified: Secondary | ICD-10-CM | POA: Diagnosis not present

## 2022-11-10 DIAGNOSIS — Z8349 Family history of other endocrine, nutritional and metabolic diseases: Secondary | ICD-10-CM

## 2022-11-10 DIAGNOSIS — Z992 Dependence on renal dialysis: Secondary | ICD-10-CM | POA: Diagnosis not present

## 2022-11-10 DIAGNOSIS — Z794 Long term (current) use of insulin: Secondary | ICD-10-CM

## 2022-11-10 DIAGNOSIS — I9589 Other hypotension: Secondary | ICD-10-CM | POA: Diagnosis not present

## 2022-11-10 LAB — COMPREHENSIVE METABOLIC PANEL
ALT: 22 U/L (ref 0–44)
AST: 24 U/L (ref 15–41)
Albumin: 3.3 g/dL — ABNORMAL LOW (ref 3.5–5.0)
Alkaline Phosphatase: 66 U/L (ref 38–126)
Anion gap: 20 — ABNORMAL HIGH (ref 5–15)
BUN: 65 mg/dL — ABNORMAL HIGH (ref 8–23)
CO2: 22 mmol/L (ref 22–32)
Calcium: 8.7 mg/dL — ABNORMAL LOW (ref 8.9–10.3)
Chloride: 95 mmol/L — ABNORMAL LOW (ref 98–111)
Creatinine, Ser: 13.38 mg/dL — ABNORMAL HIGH (ref 0.44–1.00)
GFR, Estimated: 3 mL/min — ABNORMAL LOW (ref >60)
Glucose, Bld: 247 mg/dL — ABNORMAL HIGH (ref 70–99)
Potassium: 4.4 mmol/L (ref 3.5–5.1)
Sodium: 137 mmol/L (ref 135–145)
Total Bilirubin: 0.7 mg/dL (ref 0.3–1.2)
Total Protein: 6.8 g/dL (ref 6.5–8.1)

## 2022-11-10 LAB — CBC
HCT: 27.1 % — ABNORMAL LOW (ref 36.0–46.0)
Hemoglobin: 9.1 g/dL — ABNORMAL LOW (ref 12.0–15.0)
MCH: 31.4 pg (ref 26.0–34.0)
MCHC: 33.6 g/dL (ref 30.0–36.0)
MCV: 93.4 fL (ref 80.0–100.0)
Platelets: 411 10*3/uL — ABNORMAL HIGH (ref 150–400)
RBC: 2.9 MIL/uL — ABNORMAL LOW (ref 3.87–5.11)
RDW: 14.4 % (ref 11.5–15.5)
WBC: 15.3 10*3/uL — ABNORMAL HIGH (ref 4.0–10.5)
nRBC: 0 % (ref 0.0–0.2)

## 2022-11-10 LAB — LIPASE, BLOOD: Lipase: 47 U/L (ref 11–51)

## 2022-11-10 NOTE — ED Triage Notes (Addendum)
Pt c/o bilat leg pain. Pt is on PD and does treatment every night. Pt also complains of feeling tired. With abd pain and n/v/d

## 2022-11-11 ENCOUNTER — Emergency Department: Payer: Medicare Other

## 2022-11-11 ENCOUNTER — Inpatient Hospital Stay
Admission: EM | Admit: 2022-11-11 | Discharge: 2022-11-13 | DRG: 391 | Disposition: A | Discharge status: Home / Self Care | Payer: Medicare Other | Attending: Internal Medicine | Admitting: Internal Medicine

## 2022-11-11 DIAGNOSIS — N2589 Other disorders resulting from impaired renal tubular function: Secondary | ICD-10-CM | POA: Diagnosis not present

## 2022-11-11 DIAGNOSIS — I959 Hypotension, unspecified: Secondary | ICD-10-CM | POA: Diagnosis present

## 2022-11-11 DIAGNOSIS — Z992 Dependence on renal dialysis: Secondary | ICD-10-CM | POA: Diagnosis not present

## 2022-11-11 DIAGNOSIS — D631 Anemia in chronic kidney disease: Secondary | ICD-10-CM

## 2022-11-11 DIAGNOSIS — Z809 Family history of malignant neoplasm, unspecified: Secondary | ICD-10-CM | POA: Diagnosis not present

## 2022-11-11 DIAGNOSIS — M1990 Unspecified osteoarthritis, unspecified site: Secondary | ICD-10-CM | POA: Diagnosis present

## 2022-11-11 DIAGNOSIS — E785 Hyperlipidemia, unspecified: Secondary | ICD-10-CM | POA: Diagnosis present

## 2022-11-11 DIAGNOSIS — N186 End stage renal disease: Secondary | ICD-10-CM | POA: Diagnosis not present

## 2022-11-11 DIAGNOSIS — R531 Weakness: Secondary | ICD-10-CM

## 2022-11-11 DIAGNOSIS — Z881 Allergy status to other antibiotic agents status: Secondary | ICD-10-CM | POA: Diagnosis not present

## 2022-11-11 DIAGNOSIS — I120 Hypertensive chronic kidney disease with stage 5 chronic kidney disease or end stage renal disease: Secondary | ICD-10-CM | POA: Diagnosis present

## 2022-11-11 DIAGNOSIS — D509 Iron deficiency anemia, unspecified: Secondary | ICD-10-CM | POA: Diagnosis not present

## 2022-11-11 DIAGNOSIS — G4733 Obstructive sleep apnea (adult) (pediatric): Secondary | ICD-10-CM

## 2022-11-11 DIAGNOSIS — E1129 Type 2 diabetes mellitus with other diabetic kidney complication: Secondary | ICD-10-CM | POA: Diagnosis not present

## 2022-11-11 DIAGNOSIS — R82998 Other abnormal findings in urine: Secondary | ICD-10-CM | POA: Diagnosis not present

## 2022-11-11 DIAGNOSIS — Z888 Allergy status to other drugs, medicaments and biological substances status: Secondary | ICD-10-CM | POA: Diagnosis not present

## 2022-11-11 DIAGNOSIS — K529 Noninfective gastroenteritis and colitis, unspecified: Secondary | ICD-10-CM | POA: Diagnosis not present

## 2022-11-11 DIAGNOSIS — Z811 Family history of alcohol abuse and dependence: Secondary | ICD-10-CM | POA: Diagnosis not present

## 2022-11-11 DIAGNOSIS — I9589 Other hypotension: Secondary | ICD-10-CM

## 2022-11-11 DIAGNOSIS — G9341 Metabolic encephalopathy: Secondary | ICD-10-CM | POA: Diagnosis not present

## 2022-11-11 DIAGNOSIS — E1165 Type 2 diabetes mellitus with hyperglycemia: Secondary | ICD-10-CM | POA: Diagnosis not present

## 2022-11-11 DIAGNOSIS — G9340 Encephalopathy, unspecified: Principal | ICD-10-CM

## 2022-11-11 DIAGNOSIS — Z96653 Presence of artificial knee joint, bilateral: Secondary | ICD-10-CM | POA: Diagnosis present

## 2022-11-11 DIAGNOSIS — E1122 Type 2 diabetes mellitus with diabetic chronic kidney disease: Secondary | ICD-10-CM | POA: Diagnosis present

## 2022-11-11 DIAGNOSIS — Z96643 Presence of artificial hip joint, bilateral: Secondary | ICD-10-CM | POA: Diagnosis present

## 2022-11-11 DIAGNOSIS — Z833 Family history of diabetes mellitus: Secondary | ICD-10-CM | POA: Diagnosis not present

## 2022-11-11 DIAGNOSIS — Z794 Long term (current) use of insulin: Secondary | ICD-10-CM | POA: Diagnosis not present

## 2022-11-11 DIAGNOSIS — Z822 Family history of deafness and hearing loss: Secondary | ICD-10-CM | POA: Diagnosis not present

## 2022-11-11 DIAGNOSIS — K769 Liver disease, unspecified: Secondary | ICD-10-CM | POA: Diagnosis not present

## 2022-11-11 DIAGNOSIS — R710 Precipitous drop in hematocrit: Secondary | ICD-10-CM

## 2022-11-11 DIAGNOSIS — Z1152 Encounter for screening for COVID-19: Secondary | ICD-10-CM | POA: Diagnosis not present

## 2022-11-11 DIAGNOSIS — Z818 Family history of other mental and behavioral disorders: Secondary | ICD-10-CM | POA: Diagnosis not present

## 2022-11-11 DIAGNOSIS — I10 Essential (primary) hypertension: Secondary | ICD-10-CM

## 2022-11-11 DIAGNOSIS — N2581 Secondary hyperparathyroidism of renal origin: Secondary | ICD-10-CM | POA: Diagnosis not present

## 2022-11-11 DIAGNOSIS — R112 Nausea with vomiting, unspecified: Secondary | ICD-10-CM | POA: Diagnosis present

## 2022-11-11 DIAGNOSIS — A419 Sepsis, unspecified organism: Secondary | ICD-10-CM

## 2022-11-11 DIAGNOSIS — Z8249 Family history of ischemic heart disease and other diseases of the circulatory system: Secondary | ICD-10-CM | POA: Diagnosis not present

## 2022-11-11 LAB — HEMOGLOBIN A1C
Hgb A1c MFr Bld: 7.7 % — ABNORMAL HIGH (ref 4.8–5.6)
Mean Plasma Glucose: 174 mg/dL

## 2022-11-11 LAB — TROPONIN I (HIGH SENSITIVITY)
Troponin I (High Sensitivity): 60 ng/L — ABNORMAL HIGH (ref <18)
Troponin I (High Sensitivity): 64 ng/L — ABNORMAL HIGH (ref <18)

## 2022-11-11 LAB — GLUCOSE, CAPILLARY
Glucose-Capillary: 156 mg/dL — ABNORMAL HIGH (ref 70–99)
Glucose-Capillary: 239 mg/dL — ABNORMAL HIGH (ref 70–99)
Glucose-Capillary: 186 mg/dL — ABNORMAL HIGH (ref 70–99)

## 2022-11-11 LAB — RESPIRATORY PANEL BY PCR

## 2022-11-11 LAB — CREATININE, SERUM
Creatinine, Ser: 12.87 mg/dL — ABNORMAL HIGH (ref 0.44–1.00)
GFR, Estimated: 3 mL/min — ABNORMAL LOW (ref >60)

## 2022-11-11 LAB — URINALYSIS, ROUTINE W REFLEX MICROSCOPIC
Bilirubin Urine: NEGATIVE
Glucose, UA: NEGATIVE mg/dL
Hgb urine dipstick: NEGATIVE
Ketones, ur: 5 mg/dL — AB
Leukocytes,Ua: NEGATIVE
Nitrite: NEGATIVE
Protein, ur: >=300 mg/dL — AB
Specific Gravity, Urine: 1.018 (ref 1.005–1.030)
pH: 5 (ref 5.0–8.0)

## 2022-11-11 LAB — CBG MONITORING, ED
Glucose-Capillary: 215 mg/dL — ABNORMAL HIGH (ref 70–99)
Glucose-Capillary: 122 mg/dL — ABNORMAL HIGH (ref 70–99)

## 2022-11-11 LAB — PROTIME-INR
INR: 1.2 (ref 0.8–1.2)
Prothrombin Time: 15 seconds (ref 11.4–15.2)

## 2022-11-11 LAB — LACTIC ACID, PLASMA
Lactic Acid, Venous: 3.4 mmol/L (ref 0.5–1.9)
Lactic Acid, Venous: 2.6 mmol/L (ref 0.5–1.9)

## 2022-11-11 LAB — SARS CORONAVIRUS 2 BY RT PCR: SARS Coronavirus 2 by RT PCR: NEGATIVE

## 2022-11-11 LAB — CORTISOL-AM, BLOOD: Cortisol - AM: 15.5 ug/dL (ref 6.7–22.6)

## 2022-11-11 LAB — PROCALCITONIN: Procalcitonin: 0.32 ng/mL

## 2022-11-11 MED ORDER — ASPIRIN 81 MG PO CHEW
81.0000 mg | CHEWABLE_TABLET | Freq: Every day | ORAL | Status: DC
  Administered 2022-11-11 – 2022-11-12 (×2): 81 mg via ORAL
  Filled 2022-11-11 (×2): qty 1

## 2022-11-11 MED ORDER — GENTAMICIN SULFATE 0.1 % EX CREA
1.0000 | TOPICAL_CREAM | Freq: Every day | CUTANEOUS | Status: DC
  Administered 2022-11-11 – 2022-11-13 (×2): 1 via TOPICAL
  Filled 2022-11-11: qty 15

## 2022-11-11 MED ORDER — METRONIDAZOLE 500 MG/100ML IV SOLN
500.0000 mg | Freq: Once | INTRAVENOUS | Status: AC
  Administered 2022-11-11: 500 mg via INTRAVENOUS
  Filled 2022-11-11: qty 100

## 2022-11-11 MED ORDER — INSULIN ASPART 100 UNIT/ML IJ SOLN
0.0000 [IU] | Freq: Three times a day (TID) | INTRAMUSCULAR | Status: DC
  Administered 2022-11-11 – 2022-11-13 (×4): 2 [IU] via SUBCUTANEOUS
  Filled 2022-11-11 (×5): qty 1

## 2022-11-11 MED ORDER — SODIUM CHLORIDE 0.9 % IV SOLN: INTRAVENOUS | Status: DC

## 2022-11-11 MED ORDER — RENA-VITE PO TABS
1.0000 | ORAL_TABLET | Freq: Every day | ORAL | Status: DC
  Administered 2022-11-11 – 2022-11-13 (×3): 1 via ORAL
  Filled 2022-11-11 (×3): qty 1

## 2022-11-11 MED ORDER — SODIUM CHLORIDE 0.9 % IV SOLN: 2.0000 g | Freq: Two times a day (BID) | INTRAVENOUS | Status: DC

## 2022-11-11 MED ORDER — ONDANSETRON HCL 4 MG/2ML IJ SOLN: 4.0000 mg | Freq: Four times a day (QID) | INTRAMUSCULAR | Status: DC | PRN

## 2022-11-11 MED ORDER — ACETAMINOPHEN 650 MG RE SUPP: 650.0000 mg | Freq: Four times a day (QID) | RECTAL | Status: DC | PRN

## 2022-11-11 MED ORDER — SACCHAROMYCES BOULARDII 250 MG PO CAPS
250.0000 mg | ORAL_CAPSULE | Freq: Every day | ORAL | Status: DC
  Administered 2022-11-11 – 2022-11-12 (×2): 250 mg via ORAL
  Filled 2022-11-11 (×2): qty 1

## 2022-11-11 MED ORDER — METRONIDAZOLE 500 MG/100ML IV SOLN
500.0000 mg | Freq: Two times a day (BID) | INTRAVENOUS | Status: DC
  Administered 2022-11-11 – 2022-11-12 (×2): 500 mg via INTRAVENOUS
  Filled 2022-11-11 (×3): qty 100

## 2022-11-11 MED ORDER — SODIUM CHLORIDE 0.9 % IV SOLN
1.0000 g | INTRAVENOUS | Status: DC
  Administered 2022-11-12: 1 g via INTRAVENOUS
  Filled 2022-11-11: qty 10

## 2022-11-11 MED ORDER — SODIUM CHLORIDE 0.9 % IV BOLUS
500.0000 mL | Freq: Once | INTRAVENOUS | Status: AC
  Administered 2022-11-11: 500 mL via INTRAVENOUS

## 2022-11-11 MED ORDER — SODIUM CHLORIDE 0.9 % IV BOLUS
1000.0000 mL | Freq: Once | INTRAVENOUS | Status: AC
  Administered 2022-11-11: 1000 mL via INTRAVENOUS

## 2022-11-11 MED ORDER — LACTATED RINGERS IV SOLN: INTRAVENOUS | Status: DC

## 2022-11-11 MED ORDER — SEVELAMER CARBONATE 800 MG PO TABS
2400.0000 mg | ORAL_TABLET | Freq: Three times a day (TID) | ORAL | Status: DC
  Administered 2022-11-11 – 2022-11-13 (×4): 2400 mg via ORAL
  Filled 2022-11-11 (×6): qty 3

## 2022-11-11 MED ORDER — HEPARIN SODIUM (PORCINE) 5000 UNIT/ML IJ SOLN
5000.0000 [IU] | Freq: Three times a day (TID) | INTRAMUSCULAR | Status: DC
  Administered 2022-11-11 – 2022-11-12 (×4): 5000 [IU] via SUBCUTANEOUS
  Filled 2022-11-11 (×4): qty 1

## 2022-11-11 MED ORDER — DELFLEX-LC/1.5% DEXTROSE 344 MOSM/L IP SOLN
INTRAPERITONEAL | Status: DC
  Filled 2022-11-11: qty 3000

## 2022-11-11 MED ORDER — INSULIN ASPART 100 UNIT/ML IJ SOLN
0.0000 [IU] | Freq: Every day | INTRAMUSCULAR | Status: DC
  Administered 2022-11-12: 2 [IU] via SUBCUTANEOUS
  Filled 2022-11-11: qty 1

## 2022-11-11 MED ORDER — SODIUM CHLORIDE 0.9 % IV SOLN: 2.0000 g | Freq: Once | INTRAVENOUS | Status: DC

## 2022-11-11 MED ORDER — SODIUM CHLORIDE 0.9 % IV SOLN
2.0000 g | Freq: Once | INTRAVENOUS | Status: AC
  Administered 2022-11-11: 2 g via INTRAVENOUS
  Filled 2022-11-11: qty 12.5

## 2022-11-11 MED ORDER — VENLAFAXINE HCL ER 75 MG PO CP24
150.0000 mg | ORAL_CAPSULE | Freq: Every day | ORAL | Status: DC
  Administered 2022-11-12 – 2022-11-13 (×2): 150 mg via ORAL
  Filled 2022-11-11 (×2): qty 2

## 2022-11-11 MED ORDER — AMLODIPINE BESYLATE 5 MG PO TABS
5.0000 mg | ORAL_TABLET | Freq: Every day | ORAL | Status: DC
  Administered 2022-11-11 – 2022-11-13 (×3): 5 mg via ORAL
  Filled 2022-11-11 (×3): qty 1

## 2022-11-11 MED ORDER — HYDROCODONE-ACETAMINOPHEN 5-325 MG PO TABS: 1.0000 | ORAL_TABLET | ORAL | Status: DC | PRN

## 2022-11-11 MED ORDER — HYDROMORPHONE HCL 1 MG/ML IJ SOLN: 0.5000 mg | INTRAMUSCULAR | Status: DC | PRN

## 2022-11-11 MED ORDER — CLONIDINE HCL 0.1 MG PO TABS
0.1000 mg | ORAL_TABLET | Freq: Two times a day (BID) | ORAL | Status: DC
  Administered 2022-11-11 – 2022-11-13 (×5): 0.1 mg via ORAL
  Filled 2022-11-11 (×5): qty 1

## 2022-11-11 MED ORDER — ONDANSETRON HCL 4 MG PO TABS: 4.0000 mg | ORAL_TABLET | Freq: Four times a day (QID) | ORAL | Status: DC | PRN

## 2022-11-11 MED ORDER — ACETAMINOPHEN 325 MG PO TABS: 650.0000 mg | ORAL_TABLET | Freq: Four times a day (QID) | ORAL | Status: DC | PRN

## 2022-11-11 NOTE — Progress Notes (Signed)
[facility]  ROUNDING NOTE   Subjective:   Felicia Thompson is a 76 y.o. female with past medical history of diabetes, OSA, anemia, and ESRD on PD. Patient presents to the emergency room with nausea, vomiting, and diarrhea. Lower abdominal discomfort and weakness also present. She has been admitted for Acute encephalopathy [G93.40] Generalized weakness [R53.1] Sepsis ([place]) [A41.9] Sepsis without acute organ dysfunction, due to unspecified organism [facility]) [A41.9]  Patient is known to our clinic and is followed by [facility] and Dr Holley Raring. She states she has not missed any recent treatments aside from last night due to being in hospital. Patients states she has had nausea and vomiting for several day with poor oral intake. She has continued to take all medications. Paeint has some confusion at times. No family at bedside to verify history. Denies current symptoms. Complains of weakness.   Labs on ED arrival significant for glucose 247, BUN 65, creatinine 13.38 with GFR 3, calcium 8.7, albumin 3.3, elevated WBCs 15.3 and hemoglobin 9.1.  UA appears cloudy with proteinuria and some bacteria.  Negative for COVID-19.  Expanded respiratory panel pending.  We have been consulted to continue peritoneal dialysis needs during this admission.   Objective:  Vital signs in last 24 hours:  Temp:  [98.6 F (37 C)-98.8 F (37.1 C)] 98.8 F (37.1 C) (12/11 1334) Pulse Rate:  [90-112] 101 (12/11 0931) Resp:  [15-19] 18 (12/11 0931) BP: (104-206)/(60-106) 167/78 (12/11 0931) SpO2:  [96 %-100 %] 97 % (12/11 0931) Weight:  [80.3 kg] 80.3 kg (12/10 1948)  Weight change:  Filed Weights   11/10/22 1948  Weight: 80.3 kg    Intake/Output: No intake/output data recorded.   Intake/Output this shift:  No intake/output data recorded.  Physical Exam: General: NAD  Head: Normocephalic, atraumatic.  Dry oral mucosal membranes  Eyes: Anicteric,  Lungs:  Clear to auscultation, normal  effort, room air  Heart: Regular rate and rhythm  Abdomen:  Soft, nontender, obese, PDC  Extremities: No peripheral edema.  Neurologic: Nonfocal, moving all four extremities  Skin: No lesions  Access: PD catheter    Basic Metabolic Panel: Recent Labs  Lab 11/10/22 1951 11/11/22 0326  NA 137  --   K 4.4  --   CL 95*  --   CO2 22  --   GLUCOSE 247*  --   BUN 65*  --   CREATININE 13.38* 12.87*  CALCIUM 8.7*  --     Liver Function Tests: Recent Labs  Lab 11/10/22 1951  AST 24  ALT 22  ALKPHOS 66  BILITOT 0.7  PROT 6.8  ALBUMIN 3.3*   Recent Labs  Lab 11/10/22 1951  LIPASE 47   No results for input(s): "AMMONIA" in the last 168 hours.  CBC: Recent Labs  Lab 11/10/22 1951  WBC 15.3*  HGB 9.1*  HCT 27.1*  MCV 93.4  PLT 411*    Cardiac Enzymes: No results for input(s): "CKTOTAL", "CKMB", "CKMBINDEX", "TROPONINI" in the last 168 hours.  BNP: Invalid input(s): "POCBNP"  CBG: Recent Labs  Lab 11/11/22 0825 11/11/22 1151  GLUCAP 215* 122*    Microbiology: Results for orders placed or performed during the hospital encounter of 11/11/22  Blood culture (single)     Status: None (Preliminary result)   Collection Time: 11/11/22 12:52 AM   Specimen: Right Antecubital; Blood  Result Value Ref Range Status   Specimen Description RIGHT ANTECUBITAL  Final   Special Requests   Final    BOTTLES  DRAWN AEROBIC AND ANAEROBIC Blood Culture results may not be optimal due to an excessive volume of blood received in culture bottles   Culture   Final    NO GROWTH < 12 HOURS Performed at [facility], [address] [place], [place] [address]    Report Status PENDING  Incomplete  SARS Coronavirus 2 by RT PCR (hospital order, performed in [facility] hospital lab) *cepheid single result test* Anterior Nasal Swab     Status: None   Collection Time: 11/11/22 12:52 AM   Specimen: Anterior Nasal Swab  Result Value Ref Range Status   SARS Coronavirus 2 by RT  PCR NEGATIVE NEGATIVE Final    Comment: (NOTE) SARS-CoV-2 target nucleic acids are NOT DETECTED.  The SARS-CoV-2 RNA is generally detectable in upper and lower respiratory specimens during the acute phase of infection. The lowest concentration of SARS-CoV-2 viral copies this assay can detect is 250 copies / mL. A negative result does not preclude SARS-CoV-2 infection and should not be used as the sole basis for treatment or other patient management decisions.  A negative result may occur with improper specimen collection / handling, submission of specimen other than nasopharyngeal swab, presence of viral mutation(s) within the areas targeted by this assay, and inadequate number of viral copies (<250 copies / mL). A negative result must be combined with clinical observations, patient history, and epidemiological information.  Fact Sheet for Patients:   https://www.patel.info/  Fact Sheet for Healthcare Providers: https://hall.com/  This test is not yet approved or  cleared by the [place] FDA and has been authorized for detection and/or diagnosis of SARS-CoV-2 by FDA under an Emergency Use Authorization (EUA).  This EUA will remain in effect (meaning this test can be used) for the duration of the COVID-19 declaration under Section 564(b)(1) of the Act, 21 U.S.C. section 360bbb-3(b)(1), unless the authorization is terminated or revoked sooner.  Performed at [facility], [address] [place], [place] [address]   Blood culture (single)     Status: None (Preliminary result)   Collection Time: 11/11/22  1:48 AM   Specimen: BLOOD  Result Value Ref Range Status   Specimen Description BLOOD LEFT  Final   Special Requests   Final    BOTTLES DRAWN AEROBIC AND ANAEROBIC Blood Culture results may not be optimal due to an excessive volume of blood received in culture bottles   Culture   Final    NO GROWTH < 12 HOURS Performed at  [facility], [address] [place], [place] [address]    Report Status PENDING  Incomplete    Coagulation Studies: Recent Labs    11/11/22 0326  LABPROT 15.0  INR 1.2    Urinalysis: Recent Labs    11/10/22 1030  COLORURINE YELLOW*  LABSPEC 1.018  PHURINE 5.0  GLUCOSEU NEGATIVE  HGBUR NEGATIVE  BILIRUBINUR NEGATIVE  KETONESUR 5*  PROTEINUR >=300*  NITRITE NEGATIVE  LEUKOCYTESUR NEGATIVE      Imaging: DG Chest 2 View  Result Date: 11/11/2022 CLINICAL DATA:  Weakness EXAM: CHEST - 2 VIEW COMPARISON:  12/16/2021 FINDINGS: The heart size and mediastinal contours are within normal limits. Both lungs are clear. The visualized skeletal structures are unremarkable. IMPRESSION: No active cardiopulmonary disease. Electronically Signed   By: Inez Catalina M.D.   On: 11/11/2022 01:20   CT ABDOMEN PELVIS WO CONTRAST  Result Date: 11/10/2022 CLINICAL DATA:  Abdominal pain, nausea/vomiting/diarrhea, peritoneal dialysis EXAM: CT ABDOMEN AND PELVIS WITHOUT CONTRAST TECHNIQUE: Multidetector CT  imaging of the abdomen and pelvis was performed following the standard protocol without IV contrast. Unenhanced CT was performed per clinician order. Lack of IV contrast limits sensitivity and specificity, especially for evaluation of abdominal/pelvic solid viscera. RADIATION DOSE REDUCTION: This exam was performed according to the departmental dose-optimization program which includes automated exposure control, adjustment of the mA and/or kV according to patient size and/or use of iterative reconstruction technique. COMPARISON:  None Available. FINDINGS: Lower chest: No acute pleural or parenchymal lung disease. Dense calcification of the mitral annulus. Hepatobiliary: Cholecystectomy. Unremarkable unenhanced appearance of the liver. No biliary duct dilation. Pancreas: Unremarkable unenhanced appearance. Spleen: Unremarkable unenhanced appearance. Adrenals/Urinary Tract: Bilateral renal  atrophy. No urinary tract calculi or obstructive uropathy. Bladder is decompressed, limiting its evaluation. The adrenals are unremarkable. Stomach/Bowel: No bowel obstruction or ileus. Scattered colonic diverticulosis without diverticulitis. No bowel wall thickening or inflammatory change. Vascular/Lymphatic: Aortic atherosclerosis. No enlarged abdominal or pelvic lymph nodes. Reproductive: Status post hysterectomy. No adnexal masses. Other: Peritoneal dialysis catheter within the right lower quadrant, with peritoneal dialysis fluid throughout the abdomen pelvis. No free intraperitoneal gas. No abdominal wall hernia. Musculoskeletal: No acute or destructive bony lesions. Bilateral hip arthroplasties. Reconstructed images demonstrate no additional findings. IMPRESSION: 1. Free fluid throughout the abdomen and pelvis consistent with peritoneal dialysis. 2. No acute intra-abdominal or intrapelvic process. 3.  Aortic Atherosclerosis (ICD10-I70.0). Electronically Signed   By: Randa Ngo M.D.   On: 11/10/2022 23:38     Medications:    sodium chloride 50 mL/hr at 11/11/22 1327   [START ON 11/12/2022] ceFEPime (MAXIPIME) IV     metronidazole Stopped (11/11/22 1556)    amLODipine  5 mg Oral Daily   aspirin  81 mg Oral Daily   cloNIDine  0.1 mg Oral BID   gentamicin cream  1 Application Topical Daily   heparin  5,000 Units Subcutaneous Q8H   insulin aspart  0-5 Units Subcutaneous QHS   insulin aspart  0-6 Units Subcutaneous TID WC   multivitamin  1 tablet Oral Daily   saccharomyces boulardii  250 mg Oral QHS   sevelamer carbonate  2,400 mg Oral TID WC   [START ON 11/12/2022] venlafaxine XR  150 mg Oral Q breakfast   acetaminophen **OR** acetaminophen, HYDROcodone-acetaminophen, HYDROmorphone (DILAUDID) injection, ondansetron **OR** ondansetron (ZOFRAN) IV  Assessment/ Plan:  Ms. Ondrea Dow is a 76 y.o.  female with past medical history of diabetes, OSA, anemia, and ESRD on PD. Patient  presents to the emergency room with nausea, vomiting, and diarrhea. Lower abdominal discomfort and weakness also present. She has been admitted for Acute encephalopathy [G93.40] Generalized weakness [R53.1] Sepsis ([place]) [A41.9] Sepsis without acute organ dysfunction, due to unspecified organism ([place]) [A41.9]  [facility] [place]/ 4 cycle/2 L feels  End-stage renal disease on peritoneal dialysis.  Will provide dialysis tonight, due to recent history of nausea and vomiting, poor oral intake.  Will use 1.5% dialysate.  Will continue nightly treatments during this admission.  Hepatitis B panel ordered per hospital protocol.  2. Anemia of chronic kidney disease Lab Results  Component Value Date   HGB 9.1 (L) 11/10/2022  Hemoglobin within desired target range.  Will consider EPO during this admission if below 9.  3. Secondary Hyperparathyroidism:   Lab Results  Component Value Date   CALCIUM 8.7 (L) 11/10/2022   PHOS 5.2 (H) 12/18/2021    Calcium and phosphorus within acceptable range.  Will continue to monitor bone minerals.  Continue sevelamer once tolerating oral intake.  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 Home regimen includes NPH. Most recent hemoglobin A1c is 6.0 on 03/05/22.      LOS: 0 Vera Furniss 12/11/20234:12 PM

## 2022-11-11 NOTE — ED Provider Notes (Signed)
[facility] Provider Note    Event Date/Time   First MD Initiated Contact with Patient 11/11/22 0022     (approximate)   History   Leg Pain   HPI  Felicia Thompson is a 76 y.o. female  here with generalized weakness. Pt has a h/o ESRD, OSA, IDDM, anemia. She reportedly has had a 2-3 days history of weakness,  nausea, vomiting, and loose stools. Pt has been increasingly weak and has been having near syncopal episodes when standing, in which she "stares off" and shakes from weakness then regains her strength. She has had poor appetite, weakness, and some chills. Denies any abd pain. She has had a mild cough but no SOB. No CP. No known recent sick contacts. No recent medication changes.      Physical Exam   Triage Vital Signs: ED Triage Vitals  Enc Vitals Group     BP 11/10/22 1947 127/72     Pulse Rate 11/10/22 1947 (!) 103     Resp 11/10/22 1947 19     Temp 11/10/22 1947 98.6 F (37 C)     Temp Source 11/10/22 1947 Oral     SpO2 11/10/22 1947 99 %     Weight 11/10/22 1948 177 lb (80.3 kg)     Height --      Head Circumference --      Peak Flow --      Pain Score 11/10/22 1948 0     Pain Loc --      Pain Edu? --      Excl. in [place]? --     Most recent vital signs: Vitals:   11/11/22 0430 11/11/22 0600  BP: (!) 178/71 (!) 156/60  Pulse: 98 99  Resp: 16 15  Temp:    SpO2: 99% 97%     General: Awake, no distress.  CV:  Good peripheral perfusion. RRR. No murmurs. Resp:  Normal effort. Lungs CTAB. Abd:  No distention. No tenderness. Other:  Dry MM. No edema.   ED Results / Procedures / Treatments   Labs (all labs ordered are listed, but only abnormal results are displayed) Labs Reviewed  CBC - Abnormal; Notable for the following components:      Result Value   WBC 15.3 (*)    RBC 2.90 (*)    Hemoglobin 9.1 (*)    HCT 27.1 (*)    Platelets 411 (*)    All other components within normal limits  COMPREHENSIVE METABOLIC PANEL -  Abnormal; Notable for the following components:   Chloride 95 (*)    Glucose, Bld 247 (*)    BUN 65 (*)    Creatinine, Ser 13.38 (*)    Calcium 8.7 (*)    Albumin 3.3 (*)    GFR, Estimated 3 (*)    Anion gap 20 (*)    All other components within normal limits  LACTIC ACID, PLASMA - Abnormal; Notable for the following components:   Lactic Acid, Venous 3.4 (*)    All other components within normal limits  LACTIC ACID, PLASMA - Abnormal; Notable for the following components:   Lactic Acid, Venous 2.6 (*)    All other components within normal limits  CREATININE, SERUM - Abnormal; Notable for the following components:   Creatinine, Ser 12.87 (*)    GFR, Estimated 3 (*)    All other components within normal limits  TROPONIN I (HIGH SENSITIVITY) - Abnormal; Notable for the following components:   Troponin I (  High Sensitivity) 60 (*)    All other components within normal limits  TROPONIN I (HIGH SENSITIVITY) - Abnormal; Notable for the following components:   Troponin I (High Sensitivity) 64 (*)    All other components within normal limits  SARS CORONAVIRUS 2 BY RT PCR  CULTURE, BLOOD (SINGLE)  CULTURE, BLOOD (SINGLE)  CULTURE, BLOOD (ROUTINE X 2)  CULTURE, BLOOD (ROUTINE X 2)  GASTROINTESTINAL PANEL BY PCR, STOOL (REPLACES STOOL CULTURE)  C DIFFICILE QUICK SCREEN W PCR REFLEX    LIPASE, BLOOD  PROTIME-INR  URINALYSIS, ROUTINE W REFLEX MICROSCOPIC  PROCALCITONIN  HEMOGLOBIN A1C  CORTISOL-AM, BLOOD     EKG    RADIOLOGY CXR: Clear CT A/P: No obstruction, free fluid c/w PD history   I also independently reviewed and agree with radiologist interpretations.   PROCEDURES:  Critical Care performed: Yes, see critical care procedure note(s)  Procedures    MEDICATIONS ORDERED IN ED: Medications  aspirin chewable tablet 81 mg (has no administration in time range)  insulin aspart (novoLOG) injection 0-6 Units (has no administration in time range)  insulin aspart  (novoLOG) injection 0-5 Units (has no administration in time range)  heparin injection 5,000 Units (5,000 Units Subcutaneous Given 11/11/22 0502)  lactated ringers infusion ( Intravenous New Bag/Given 11/11/22 0404)  metroNIDAZOLE (FLAGYL) IVPB 500 mg (has no administration in time range)  acetaminophen (TYLENOL) tablet 650 mg (has no administration in time range)    Or  acetaminophen (TYLENOL) suppository 650 mg (has no administration in time range)  HYDROcodone-acetaminophen (NORCO/VICODIN) 5-325 MG per tablet 1-2 tablet (has no administration in time range)  ondansetron (ZOFRAN) tablet 4 mg (has no administration in time range)    Or  ondansetron (ZOFRAN) injection 4 mg (has no administration in time range)  HYDROmorphone (DILAUDID) injection 0.5 mg (has no administration in time range)  ceFEPIme (MAXIPIME) 2 g in sodium chloride 0.9 % 100 mL IVPB (has no administration in time range)  sodium chloride 0.9 % bolus 500 mL (0 mLs Intravenous Stopped 11/11/22 0204)  sodium chloride 0.9 % bolus 1,000 mL (0 mLs Intravenous Stopped 11/11/22 0341)  metroNIDAZOLE (FLAGYL) IVPB 500 mg (0 mg Intravenous Stopped 11/11/22 0319)  ceFEPIme (MAXIPIME) 2 g in sodium chloride 0.9 % 100 mL IVPB (0 g Intravenous Stopped 11/11/22 0318)     IMPRESSION / MDM / ASSESSMENT AND PLAN / ED COURSE  I reviewed the triage vital signs and the nursing notes.                              Differential diagnosis includes, but is not limited to,   Patient's presentation is most consistent with acute presentation with potential threat to life or bodily function.   76 year old female with past medical history of end-stage renal disease on peritoneal dialysis, hypertension, hyperlipidemia, here with generalized weakness and difficulty ambulating.  Clinically patient appears dehydrated.  Lab work is concerning for possible sepsis with significant leukocytosis of 15.3 and lactic acid of 3.4.  Troponins at baseline.   COVID-negative.  CMP shows acceptable electrolytes.  Anion gap elevated at 20 but I suspect this is due to her uremia as well as lactic acid elevation.  Patient given IV fluids.  Chest x-ray clear.  CT abdomen pelvis obtained, reviewed, shows free fluid consistent with peritoneal dialysis but is otherwise reassuring.  Patient initially given 1.5 L, given her end-stage renal status.  Lactic acid downtrending.  Will plan to admit  to medicine.   FINAL CLINICAL IMPRESSION(S) / ED DIAGNOSES   Final diagnoses:  Acute encephalopathy  Generalized weakness  Sepsis without acute organ dysfunction, due to unspecified organism [facility])     Rx / DC Orders   ED Discharge Orders     None        Note:  This document was prepared using Dragon voice recognition software and may include unintentional dictation errors.   Duffy Bruce, MD 11/11/22 [phone]

## 2022-11-11 NOTE — Assessment & Plan Note (Deleted)
Hemoglobin 9.1.  Continue to monitor.

## 2022-11-11 NOTE — Assessment & Plan Note (Signed)
Nephrology consult for continuation of PD

## 2022-11-11 NOTE — Assessment & Plan Note (Addendum)
Stool studies ordered but no further diarrhea yet.  Sepsis ruled out since I do not have a source of infection..  Leukocytosis tachycardia and hypotension.  Follow-up blood cultures and urine culture.  Patient on empiric antibiotics but will downgrade to oral antibiotics today.

## 2022-11-11 NOTE — ED Notes (Signed)
This RN has not had chance to straight cath for urine. Caring for 5 pts. Order was from last night. Pt states she could urinate into hat at some point but does not need to void at this time.

## 2022-11-11 NOTE — Progress Notes (Signed)
PHARMACY -  BRIEF ANTIBIOTIC NOTE   Pharmacy has received consult(s) for Cefepime from an ED provider.  The patient's profile has been reviewed for ht/wt/allergies/indication/available labs.    One time order(s) placed for Cefepime 2 gm IV X 1   Further antibiotics/pharmacy consults should be ordered by admitting physician if indicated.                       Thank you, Jaesean Litzau D 11/11/2022  1:54 AM

## 2022-11-11 NOTE — Progress Notes (Signed)
Pharmacy Antibiotic Note  Felicia Thompson is a 76 y.o. female admitted on 11/11/2022 with sepsis.  Pharmacy has been consulted for Cefepime dosing.  Pt on peritoneal dialysis.   Plan: Change Cefepime to 1 gram IV every 24 hours  Weight: 80.3 kg (177 lb)  Temp (24hrs), Avg:98.6 F (37 C), Min:98.6 F (37 C), Max:98.7 F (37.1 C)  Recent Labs  Lab 11/10/22 1951 11/11/22 0052 11/11/22 0326  WBC 15.3*  --   --   CREATININE 13.38*  --  12.87*  LATICACIDVEN  --  3.4* 2.6*     Estimated Creatinine Clearance: 4.2 mL/min (A) (by C-G formula based on SCr of 12.87 mg/dL (H)).    Allergies  Allergen Reactions   Tape Other (See Comments)    Blisters CAN USE PAPER TAPE   Tetracyclines & Related Rash    Antimicrobials this admission: Cefepime 12/11 >>   Flagyl 12/11>>   Dose adjustments this admission: Changed from Cefepime 2 grams q12 to 1 gram every 24 hours  Microbiology results: 12/11 BCx: ngtd 12/11 GI PCR panel:  pending   Thank you for allowing pharmacy to be a part of this patient's care.  Lorin Picket, PharmD 11/11/2022 12:09 PM

## 2022-11-11 NOTE — ED Notes (Signed)
Helped pt to toilet using wheelchair. Pt instructed on how to obtain clean catch and how to clean before sample. Pt does not want in and out cath. Pt denies dizziness with standing.

## 2022-11-11 NOTE — ED Notes (Signed)
Pt given breakfast tray

## 2022-11-11 NOTE — ED Notes (Signed)
Pt unable to urinate. Will cath soon.

## 2022-11-11 NOTE — Assessment & Plan Note (Addendum)
Mental status much improved from admission.

## 2022-11-11 NOTE — Progress Notes (Signed)
Pharmacy Antibiotic Note  Felicia Thompson is a 76 y.o. female admitted on 11/11/2022 with sepsis.  Pharmacy has been consulted for Cefepime dosing.  Pt on peritoneal dialysis.   Plan: Cefepime 2 gm IV X 1 given in ED on 12/11 @ 0205. Cefepime 2 gm IV Q12H ordered to continue on 12/11 @ 1400.   Weight: 80.3 kg (177 lb)  Temp (24hrs), Avg:98.7 F (37.1 C), Min:98.6 F (37 C), Max:98.7 F (37.1 C)  Recent Labs  Lab 11/10/22 1951 11/11/22 0052  WBC 15.3*  --   CREATININE 13.38*  --   LATICACIDVEN  --  3.4*    Estimated Creatinine Clearance: 4 mL/min (A) (by C-G formula based on SCr of 13.38 mg/dL (H)).    Allergies  Allergen Reactions   Tape Other (See Comments)    Blisters   Tetracyclines & Related Rash    Antimicrobials this admission:   >>    >>   Dose adjustments this admission:   Microbiology results:  BCx:   UCx:    Sputum:    MRSA PCR:   Thank you for allowing pharmacy to be a part of this patient's care.  Shonya Sumida D 11/11/2022 3:09 AM

## 2022-11-11 NOTE — Assessment & Plan Note (Signed)
CPAP nightly

## 2022-11-11 NOTE — H&P (Addendum)
History and Physical    Patient: Felicia Thompson ZDG:[id number] DOB: Jul 01, 1946 DOA: 11/11/2022 DOS: the patient was seen and examined on 11/11/2022 PCP: Isaac Bliss, Rayford Halsted, MD  Patient coming from: Home  Chief Complaint:  Chief Complaint  Patient presents with   Leg Pain    HPI: Felicia Thompson is a 76 y.o. female with medical history significant for ESRD on nightly PD, OSA, insulin-dependent type 2 diabetes, anemia of CKD who presents to the ED with a 2 to 3-day history of nausea, vomiting and diarrhea, lower abdominal discomfort, generalized weakness and episodes of confusion.  Most of the history is given by the daughter at the bedside who stated that the day prior her mother who is usually very sharp at baseline had problems remembering things and maneuvering on Facebook.  She has had no fever or chills, chest pain or shortness of breath.   ED course and data review: BP normotensive on arrival but dropping to as low as 104/75 and tachycardic to 112 with O2 sat of 100% on room air.  Labs significant for WBC of 15,300 with a lactic acid of 3.4.  Glucose 247, anion gap 20 with normal bicarb.  Troponin 60.  Lipase and LFTs within normal limits.  Procalcitonin pending.Disease  Chest x-ray showed no acute CT abdomen and pelvis with contrast was nonacute.  Showed free fluid throughout the abdomen and pelvis consistent with peritoneal dialysis. Patient was given a 1.5 L NS bolus started on cefepime and metronidazole and hospitalist consulted for admission.   Review of Systems: As mentioned in the history of present illness. All other systems reviewed and are negative.  Past Medical History:  Diagnosis Date   Anemia    Arthritis    Blood in stool    when appendix burst   Chronic kidney disease    Stage 4, being evaluated for transplant   Complication of anesthesia    1 episode, pt was paralysed but still aware of ongoing events   Depression    DM (diabetes mellitus),  type 2 with complications (HCC)    Dyspnea    with mild exertion   ESRD on peritoneal dialysis ([place])    History of fainting spells of unknown cause    Hyperlipidemia    Hyperlipidemia associated with type 2 diabetes mellitus ([place])    Hypertension    Motion sickness    ocean ship   OSA (obstructive sleep apnea)    Past Surgical History:  Procedure Laterality Date   ABDOMINAL HYSTERECTOMY     APPENDECTOMY     CHOLECYSTECTOMY     REPLACEMENT TOTAL KNEE BILATERAL Bilateral    TONSILECTOMY, ADENOIDECTOMY, BILATERAL MYRINGOTOMY AND TUBES     TOTAL HIP ARTHROPLASTY Bilateral    Social History:  reports that she has never smoked. She has never used smokeless tobacco. She reports that she does not drink alcohol and does not use drugs.  Allergies  Allergen Reactions   Tape Other (See Comments)    Blisters   Tetracyclines & Related Rash    Family History  Problem Relation Age of Onset   Hearing loss Father    Hyperlipidemia Father    Heart disease Father    Early death Father    Cancer Father    Early death Brother    Drug abuse Brother    Diabetes Brother    Depression Brother    Alcohol abuse Brother    Miscarriages / [place] Daughter    Arthritis Daughter  Depression Daughter    Early death Maternal Grandmother    Heart attack Maternal Grandmother    Heart disease Maternal Grandmother    Hearing loss Paternal Grandmother    Heart disease Paternal Grandmother    Heart disease Paternal Grandfather    Early death Paternal Grandfather    Heart attack Paternal Grandfather     Prior to Admission medications   Medication Sig Start Date End Date Taking? Authorizing Provider  amLODipine (NORVASC) 5 MG tablet Take 5 mg by mouth daily.    [provider]  aspirin 81 MG chewable tablet Chew 81 mg by mouth daily.    [provider]  carvedilol (COREG) 25 MG tablet Take 25 mg by mouth 2 (two) times daily with a meal.    [provider]  cloNIDine  (CATAPRES) 0.1 MG tablet Take 0.1 mg by mouth 2 (two) times daily.    [provider]  diphenhydramine-acetaminophen (TYLENOL PM) 25-500 MG TABS tablet Take 1 tablet by mouth at bedtime as needed.    [provider]  glipiZIDE (GLUCOTROL) 5 MG tablet Take 0.5 tablets (2.5 mg total) by mouth daily before breakfast. 02/05/22   Renato Shin, MD  insulin NPH-regular Human (HUMULIN 70/30) (70-30) 100 UNIT/ML injection Inject 10 Units into the skin daily with breakfast. Patient taking differently: Inject 10 Units into the skin at bedtime. 08/17/21   Renato Shin, MD  losartan (COZAAR) 50 MG tablet Take 1 tablet (50 mg total) by mouth daily. Patient taking differently: Take 50 mg by mouth 2 (two) times daily. 10/12/21   Lora Havens, MD  Multiple Vitamins-Minerals (RENAPLEX) TABS Take 1 tablet by mouth daily. 08/30/21   [provider]  Probiotic Product ([employer] ADV PROBIOTIC GUMMIES PO) Take 1 tablet by mouth at bedtime.    [provider]  sevelamer carbonate (RENVELA) 800 MG tablet Take 2,400 mg by mouth 3 (three) times daily with meals.    [provider]  venlafaxine XR (EFFEXOR-XR) 150 MG 24 hr capsule Take 1 capsule (150 mg total) by mouth daily with breakfast. 03/26/22   Isaac Bliss, Rayford Halsted, MD    Physical Exam: Vitals:   11/10/22 2245 11/11/22 0045 11/11/22 0046 11/11/22 0048  BP: (!) 154/76 (!) 155/106 (!) 150/75 104/75  Pulse: (!) 102 90 98 (!) 112  Resp: 18 18 18 18   Temp: 98.7 F (37.1 C)     TempSrc: Oral     SpO2: 100% 99% 99% 100%  Weight:       Physical Exam Vitals and nursing note reviewed.  Constitutional:      General: She is not in acute distress. HENT:     Head: Normocephalic and atraumatic.  Cardiovascular:     Rate and Rhythm: Regular rhythm. Tachycardia present.     Heart sounds: Normal heart sounds.  Pulmonary:     Effort: Pulmonary effort is normal.     Breath sounds: Normal breath sounds.  Abdominal:      Palpations: Abdomen is soft.     Tenderness: There is no abdominal tenderness.  Neurological:     Mental Status: Mental status is at baseline.     Labs on Admission: I have personally reviewed following labs and imaging studies  CBC: Recent Labs  Lab 11/10/22 1951  WBC 15.3*  HGB 9.1*  HCT 27.1*  MCV 93.4  PLT 397*   Basic Metabolic Panel: Recent Labs  Lab 11/10/22 1951  NA 137  K 4.4  CL 95*  CO2  22  GLUCOSE 247*  BUN 65*  CREATININE 13.38*  CALCIUM 8.7*   GFR: Estimated Creatinine Clearance: 4 mL/min (A) (by C-G formula based on SCr of 13.38 mg/dL (H)). Liver Function Tests: Recent Labs  Lab 11/10/22 1951  AST 24  ALT 22  ALKPHOS 66  BILITOT 0.7  PROT 6.8  ALBUMIN 3.3*   Recent Labs  Lab 11/10/22 1951  LIPASE 47   No results for input(s): "AMMONIA" in the last 168 hours. Coagulation Profile: No results for input(s): "INR", "PROTIME" in the last 168 hours. Cardiac Enzymes: No results for input(s): "CKTOTAL", "CKMB", "CKMBINDEX", "TROPONINI" in the last 168 hours. BNP (last 3 results) No results for input(s): "PROBNP" in the last 8760 hours. HbA1C: No results for input(s): "HGBA1C" in the last 72 hours. CBG: No results for input(s): "GLUCAP" in the last 168 hours. Lipid Profile: No results for input(s): "CHOL", "HDL", "LDLCALC", "TRIG", "CHOLHDL", "LDLDIRECT" in the last 72 hours. Thyroid Function Tests: No results for input(s): "TSH", "T4TOTAL", "FREET4", "T3FREE", "THYROIDAB" in the last 72 hours. Anemia Panel: No results for input(s): "VITAMINB12", "FOLATE", "FERRITIN", "TIBC", "IRON", "RETICCTPCT" in the last 72 hours. Urine analysis:    Component Value Date/Time   COLORURINE YELLOW (A) 12/17/2021 0031   APPEARANCEUR CLEAR (A) 12/17/2021 0031   LABSPEC 1.025 12/17/2021 0031   PHURINE 6.0 12/17/2021 0031   GLUCOSEU NEGATIVE 12/17/2021 0031   HGBUR TRACE (A) 12/17/2021 0031   BILIRUBINUR NEGATIVE 12/17/2021 0031   KETONESUR NEGATIVE  12/17/2021 0031   PROTEINUR >300 (A) 12/17/2021 0031   NITRITE NEGATIVE 12/17/2021 0031   LEUKOCYTESUR TRACE (A) 12/17/2021 0031    Radiological Exams on Admission: DG Chest 2 View  Result Date: 11/11/2022 CLINICAL DATA:  Weakness EXAM: CHEST - 2 VIEW COMPARISON:  12/16/2021 FINDINGS: The heart size and mediastinal contours are within normal limits. Both lungs are clear. The visualized skeletal structures are unremarkable. IMPRESSION: No active cardiopulmonary disease. Electronically Signed   By: Inez Catalina M.D.   On: 11/11/2022 01:20   CT ABDOMEN PELVIS WO CONTRAST  Result Date: 11/10/2022 CLINICAL DATA:  Abdominal pain, nausea/vomiting/diarrhea, peritoneal dialysis EXAM: CT ABDOMEN AND PELVIS WITHOUT CONTRAST TECHNIQUE: Multidetector CT imaging of the abdomen and pelvis was performed following the standard protocol without IV contrast. Unenhanced CT was performed per clinician order. Lack of IV contrast limits sensitivity and specificity, especially for evaluation of abdominal/pelvic solid viscera. RADIATION DOSE REDUCTION: This exam was performed according to the departmental dose-optimization program which includes automated exposure control, adjustment of the mA and/or kV according to patient size and/or use of iterative reconstruction technique. COMPARISON:  None Available. FINDINGS: Lower chest: No acute pleural or parenchymal lung disease. Dense calcification of the mitral annulus. Hepatobiliary: Cholecystectomy. Unremarkable unenhanced appearance of the liver. No biliary duct dilation. Pancreas: Unremarkable unenhanced appearance. Spleen: Unremarkable unenhanced appearance. Adrenals/Urinary Tract: Bilateral renal atrophy. No urinary tract calculi or obstructive uropathy. Bladder is decompressed, limiting its evaluation. The adrenals are unremarkable. Stomach/Bowel: No bowel obstruction or ileus. Scattered colonic diverticulosis without diverticulitis. No bowel wall thickening or  inflammatory change. Vascular/Lymphatic: Aortic atherosclerosis. No enlarged abdominal or pelvic lymph nodes. Reproductive: Status post hysterectomy. No adnexal masses. Other: Peritoneal dialysis catheter within the right lower quadrant, with peritoneal dialysis fluid throughout the abdomen pelvis. No free intraperitoneal gas. No abdominal wall hernia. Musculoskeletal: No acute or destructive bony lesions. Bilateral hip arthroplasties. Reconstructed images demonstrate no additional findings. IMPRESSION: 1. Free fluid throughout the abdomen and pelvis consistent with peritoneal dialysis. 2. No acute intra-abdominal  or intrapelvic process. 3.  Aortic Atherosclerosis (ICD10-I70.0). Electronically Signed   By: Randa Ngo M.D.   On: 11/10/2022 23:38     Data Reviewed: Relevant notes from primary care and specialist visits, past discharge summaries as available in EHR, including Care Everywhere. Prior diagnostic testing as pertinent to current admission diagnoses Updated medications and problem lists for reconciliation ED course, including vitals, labs, imaging, treatment and response to treatment Triage notes, nursing and pharmacy notes and ED provider's notes Notable results as noted in HPI   Assessment and Plan: * Sepsis ([place]) Acute gastroenteritis Potential etiologies SBP but patient denies abdominal pain Sepsis criteria includes tachycardia, hypotension, leukocytosis and lactic acidosis with acute encephalopathy Continue sepsis fluids with monitoring for overload as patient did not do PD session on the night of arrival due to being in the ED Continue cefepime and Flagyl Follow GI panel and stool for C. difficile  Acute metabolic encephalopathy Secondary to acute infection/sepsis Neurologic checks with fall and aspiration precautions  Hypotension Borderline BP of 104/75 in the ED will hold amlodipine, carvedilol, clonidine and losartan Resume as appropriate  Anemia in chronic kidney  disease, on chronic dialysis ([place]) Hemoglobin at baseline at 9.1  Hypertension Holding meds due to soft blood pressures  Uncontrolled type 2 diabetes mellitus with hyperglycemia, with long-term current use of insulin (HCC) Blood sugar 247 Hold glipizide.  Will also hold 7030 tonight Sliding scale coverage  OSA (obstructive sleep apnea) CPAP nightly  ESRD on peritoneal dialysis [facility]) Nephrology consult for continuation of PD        DVT prophylaxis: Heparin  Consults: Nephrology, Dr. Holley Raring  Advance Care Planning:   Code Status: Prior   Family Communication: none  Disposition Plan: Back to previous home environment  Severity of Illness: The appropriate patient status for this patient is INPATIENT. Inpatient status is judged to be reasonable and necessary in order to provide the required intensity of service to ensure the patient's safety. The patient's presenting symptoms, physical exam findings, and initial radiographic and laboratory data in the context of their chronic comorbidities is felt to place them at high risk for further clinical deterioration. Furthermore, it is not anticipated that the patient will be medically stable for discharge from the hospital within 2 midnights of admission.   * I certify that at the point of admission it is my clinical judgment that the patient will require inpatient hospital care spanning beyond 2 midnights from the point of admission due to high intensity of service, high risk for further deterioration and high frequency of surveillance required.*  Author: Athena Masse, MD 11/11/2022 2:40 AM  For on call review [email or web address].

## 2022-11-11 NOTE — Assessment & Plan Note (Addendum)
Restarted clonidine and Norvasc.

## 2022-11-11 NOTE — Assessment & Plan Note (Addendum)
Continue sliding scale insulin.  Hemoglobin A1c 7.7.  Will add low-dose Semglee insulin at night.

## 2022-11-11 NOTE — Assessment & Plan Note (Deleted)
Acute gastroenteritis Potential etiologies SBP but patient denies abdominal pain Sepsis criteria includes tachycardia, hypotension, leukocytosis and lactic acidosis with acute encephalopathy Continue sepsis fluids with monitoring for overload as patient did not do PD session on the night of arrival due to being in the ED Continue cefepime and Flagyl Follow GI panel and stool for C. difficile

## 2022-11-11 NOTE — Assessment & Plan Note (Addendum)
Blood pressure improved with IV fluids can restart Norvasc and clonidine.

## 2022-11-11 NOTE — Progress Notes (Signed)
  Progress Note   Patient: Felicia Thompson RFX:[id number] DOB: 12/20/1945 DOA: 11/11/2022     0 DOS: the patient was seen and examined on 11/11/2022     Assessment and Plan: Acute gastroenteritis Stool studies ordered but no further diarrhea yet.  Possible sepsis.  Leukocytosis tachycardia and hypotension.  Follow-up blood cultures and urine culture.  Patient on empiric antibiotics.  Acute metabolic encephalopathy Mental status much improved from admission.  Hypotension Blood pressure improved with IV fluids can restart Norvasc and clonidine.  Anemia in chronic kidney disease, on chronic dialysis (HCC) Hemoglobin 9.1.  Continue to monitor.  Hypertension Restarted clonidine and Norvasc.  Uncontrolled type 2 diabetes mellitus with hyperglycemia, with long-term current use of insulin (HCC) Continue sliding scale insulin.  Hemoglobin A1c pending.  OSA (obstructive sleep apnea) CPAP nightly  ESRD on peritoneal dialysis [facility]) Nephrology consult for continuation of PD        Subjective: Patient feeling better now.  Came in with leg pain and weakness.  Had hypotension.  Had leg cramps or leg had some vomiting and a little bit of cough and she felt unsteady but did not fall.  Physical Exam: Vitals:   11/11/22 0600 11/11/22 0630 11/11/22 0931 11/11/22 1334  BP: (!) 156/60 (!) 177/73 (!) 167/78   Pulse: 99 95 (!) 101   Resp: 15 16 18    Temp:   98.6 F (37 C) 98.8 F (37.1 C)  TempSrc:   Oral Oral  SpO2: 97% 96% 97%   Weight:       Physical Exam HENT:     Head: Normocephalic.     Mouth/Throat:     Pharynx: No oropharyngeal exudate.  Eyes:     General: Lids are normal.     Conjunctiva/sclera: Conjunctivae normal.  Cardiovascular:     Rate and Rhythm: Normal rate and regular rhythm.     Heart sounds: Normal heart sounds, S1 normal and S2 normal.  Pulmonary:     Breath sounds: No decreased breath sounds, wheezing, rhonchi or rales.  Abdominal:     Palpations:  Abdomen is soft.     Tenderness: There is no abdominal tenderness.  Musculoskeletal:     Right lower leg: No swelling.     Left lower leg: No swelling.  Skin:    General: Skin is warm.     Findings: No rash.  Neurological:     Mental Status: She is alert and oriented to person, place, and time.     Comments: Able to straight leg raise.     Data Reviewed: White blood count 15.3, hemoglobin 9.1, platelet count 411 Creatinine 12.87  Family Communication: Left message for daughter  Disposition: Status is: Inpatient Remains inpatient appropriate because: Need to figure out if we are treating sepsis.  Seem to improve with IV fluids.  Planned Discharge Destination: Home    Time spent: 28 minutes  Author: Loletha Grayer, MD 11/11/2022 2:09 PM  For on call review [email or web address].

## 2022-11-12 ENCOUNTER — Inpatient Hospital Stay: Payer: Medicare Other

## 2022-11-12 ENCOUNTER — Ambulatory Visit: Payer: Self-pay

## 2022-11-12 DIAGNOSIS — I9589 Other hypotension: Secondary | ICD-10-CM | POA: Diagnosis not present

## 2022-11-12 DIAGNOSIS — K529 Noninfective gastroenteritis and colitis, unspecified: Secondary | ICD-10-CM | POA: Diagnosis not present

## 2022-11-12 DIAGNOSIS — R710 Precipitous drop in hematocrit: Secondary | ICD-10-CM

## 2022-11-12 DIAGNOSIS — G9341 Metabolic encephalopathy: Secondary | ICD-10-CM | POA: Diagnosis not present

## 2022-11-12 LAB — BASIC METABOLIC PANEL
Anion gap: 10 (ref 5–15)
BUN: 64 mg/dL — ABNORMAL HIGH (ref 8–23)
CO2: 23 mmol/L (ref 22–32)
Calcium: 7.2 mg/dL — ABNORMAL LOW (ref 8.9–10.3)
Chloride: 102 mmol/L (ref 98–111)
Creatinine, Ser: 12.49 mg/dL — ABNORMAL HIGH (ref 0.44–1.00)
GFR, Estimated: 3 mL/min — ABNORMAL LOW (ref >60)
Glucose, Bld: 134 mg/dL — ABNORMAL HIGH (ref 70–99)
Potassium: 3.2 mmol/L — ABNORMAL LOW (ref 3.5–5.1)
Sodium: 135 mmol/L (ref 135–145)

## 2022-11-12 LAB — LACTIC ACID, PLASMA: Lactic Acid, Venous: 1.2 mmol/L (ref 0.5–1.9)

## 2022-11-12 LAB — GLUCOSE, CAPILLARY
Glucose-Capillary: 128 mg/dL — ABNORMAL HIGH (ref 70–99)
Glucose-Capillary: 174 mg/dL — ABNORMAL HIGH (ref 70–99)
Glucose-Capillary: 226 mg/dL — ABNORMAL HIGH (ref 70–99)
Glucose-Capillary: 231 mg/dL — ABNORMAL HIGH (ref 70–99)

## 2022-11-12 LAB — CBC
HCT: 18.7 % — ABNORMAL LOW (ref 36.0–46.0)
Hemoglobin: 6.5 g/dL — ABNORMAL LOW (ref 12.0–15.0)
MCH: 31.4 pg (ref 26.0–34.0)
MCHC: 34.8 g/dL (ref 30.0–36.0)
MCV: 90.3 fL (ref 80.0–100.0)
Platelets: 256 10*3/uL (ref 150–400)
RBC: 2.07 MIL/uL — ABNORMAL LOW (ref 3.87–5.11)
RDW: 14.3 % (ref 11.5–15.5)
WBC: 7.3 10*3/uL (ref 4.0–10.5)
nRBC: 0 % (ref 0.0–0.2)

## 2022-11-12 LAB — HEPATITIS B SURFACE ANTIGEN: Hepatitis B Surface Ag: NONREACTIVE

## 2022-11-12 LAB — PREPARE RBC (CROSSMATCH)

## 2022-11-12 LAB — FERRITIN: Ferritin: 397 ng/mL — ABNORMAL HIGH (ref 11–307)

## 2022-11-12 MED ORDER — PANTOPRAZOLE SODIUM 40 MG PO TBEC
40.0000 mg | DELAYED_RELEASE_TABLET | Freq: Every day | ORAL | Status: DC
  Administered 2022-11-12 – 2022-11-13 (×2): 40 mg via ORAL
  Filled 2022-11-12 (×2): qty 1

## 2022-11-12 MED ORDER — INSULIN GLARGINE-YFGN 100 UNIT/ML ~~LOC~~ SOLN
5.0000 [IU] | Freq: Every day | SUBCUTANEOUS | Status: DC
  Administered 2022-11-12: 5 [IU] via SUBCUTANEOUS
  Filled 2022-11-12 (×2): qty 0.05

## 2022-11-12 MED ORDER — SODIUM CHLORIDE 0.9% IV SOLUTION: Freq: Once | INTRAVENOUS | Status: AC

## 2022-11-12 MED ORDER — ACETAMINOPHEN 325 MG PO TABS
650.0000 mg | ORAL_TABLET | Freq: Once | ORAL | Status: AC
  Administered 2022-11-12: 650 mg via ORAL
  Filled 2022-11-12: qty 2

## 2022-11-12 MED ORDER — CEFDINIR 300 MG PO CAPS
300.0000 mg | ORAL_CAPSULE | Freq: Every day | ORAL | Status: DC
  Administered 2022-11-12: 300 mg via ORAL
  Filled 2022-11-12: qty 1

## 2022-11-12 MED ORDER — FUROSEMIDE 10 MG/ML IJ SOLN
80.0000 mg | Freq: Once | INTRAMUSCULAR | Status: AC
  Administered 2022-11-12: 80 mg via INTRAVENOUS
  Filled 2022-11-12: qty 8

## 2022-11-12 MED ORDER — METRONIDAZOLE 500 MG PO TABS
500.0000 mg | ORAL_TABLET | Freq: Two times a day (BID) | ORAL | Status: DC
  Administered 2022-11-12 – 2022-11-13 (×2): 500 mg via ORAL
  Filled 2022-11-12 (×2): qty 1

## 2022-11-12 NOTE — Plan of Care (Signed)
  Problem: Fluid Volume: Goal: Hemodynamic stability will improve Outcome: Progressing   Problem: Clinical Measurements: Goal: Signs and symptoms of infection will decrease Outcome: Progressing   Problem: Activity: Goal: Risk for activity intolerance will decrease Outcome: Progressing   Problem: Nutrition: Goal: Adequate nutrition will be maintained Outcome: Progressing   Problem: Elimination: Goal: Will not experience complications related to bowel motility Outcome: Progressing

## 2022-11-12 NOTE — Progress Notes (Signed)
[facility]  ROUNDING NOTE   Subjective:   Felicia Thompson is a 76 y.o. female with past medical history of diabetes, OSA, anemia, and ESRD on PD. Patient presents to the emergency room with nausea, vomiting, and diarrhea. Lower abdominal discomfort and weakness also present. She has been admitted for Acute encephalopathy [G93.40] Generalized weakness [R53.1] Sepsis ([place]) [A41.9] Sepsis without acute organ dysfunction, due to unspecified organism [facility]) [A41.9]  Patient is known to our clinic and is followed by [facility] and Dr Holley Raring. She states she has not missed any recent treatments.  Patient seen sitting up in bed, currently eating breakfast Denies nausea or vomiting since admission Denies diarrhea since admission Remains on room air Tolerated PD treatment well overnight   Objective:  Vital signs in last 24 hours:  Temp:  [98.1 F (36.7 C)-98.3 F (36.8 C)] 98.2 F (36.8 C) (12/12 0740) Pulse Rate:  [76-94] 76 (12/12 0740) Resp:  [18-20] 18 (12/12 0740) BP: (146-181)/(58-80) 172/68 (12/12 0740) SpO2:  [99 %-100 %] 100 % (12/12 0740)  Weight change:  Filed Weights   11/10/22 1948  Weight: 80.3 kg    Intake/Output: I/O last 3 completed shifts: In: 309.1 [I.V.:108.9; IV Piggyback:200.2] Out: -    Intake/Output this shift:  Total I/O In: 340.2 [P.O.:240; IV Piggyback:100.2] Out: 503 [Other:503]  Physical Exam: General: NAD  Head: Normocephalic, atraumatic.  Dry oral mucosal membranes  Eyes: Anicteric,  Lungs:  Clear to auscultation, normal effort, room air  Heart: Regular rate and rhythm  Abdomen:  Soft, nontender, obese, PDC  Extremities: No peripheral edema.  Neurologic: Nonfocal, moving all four extremities  Skin: No lesions  Access: PD catheter    Basic Metabolic Panel: Recent Labs  Lab 11/10/22 1951 11/11/22 0326 11/12/22 0837  NA 137  --  135  K 4.4  --  3.2*  CL 95*  --  102  CO2 22  --  23  GLUCOSE 247*  --  134*   BUN 65*  --  64*  CREATININE 13.38* 12.87* 12.49*  CALCIUM 8.7*  --  7.2*     Liver Function Tests: Recent Labs  Lab 11/10/22 1951  AST 24  ALT 22  ALKPHOS 66  BILITOT 0.7  PROT 6.8  ALBUMIN 3.3*    Recent Labs  Lab 11/10/22 1951  LIPASE 47    No results for input(s): "AMMONIA" in the last 168 hours.  CBC: Recent Labs  Lab 11/10/22 1951 11/12/22 0837  WBC 15.3* 7.3  HGB 9.1* 6.5*  HCT 27.1* 18.7*  MCV 93.4 90.3  PLT 411* 256     Cardiac Enzymes: No results for input(s): "CKTOTAL", "CKMB", "CKMBINDEX", "TROPONINI" in the last 168 hours.  BNP: Invalid input(s): "POCBNP"  CBG: Recent Labs  Lab 11/11/22 1643 11/11/22 2002 11/11/22 2206 11/12/22 0738 11/12/22 1148  GLUCAP 156* 239* 186* 128* 4*     Microbiology: Results for orders placed or performed during the hospital encounter of 11/11/22  Blood culture (single)     Status: None (Preliminary result)   Collection Time: 11/11/22 12:52 AM   Specimen: Right Antecubital; Blood  Result Value Ref Range Status   Specimen Description RIGHT ANTECUBITAL  Final   Special Requests   Final    BOTTLES DRAWN AEROBIC AND ANAEROBIC Blood Culture results may not be optimal due to an excessive volume of blood received in culture bottles   Culture   Final    NO GROWTH < 12 HOURS Performed at [facility],  [place], [place] [address]    Report Status PENDING  Incomplete  SARS Coronavirus 2 by RT PCR (hospital order, performed in [facility] hospital lab) *cepheid single result test* Anterior Nasal Swab     Status: None   Collection Time: 11/11/22 12:52 AM   Specimen: Anterior Nasal Swab  Result Value Ref Range Status   SARS Coronavirus 2 by RT PCR NEGATIVE NEGATIVE Final    Comment: (NOTE) SARS-CoV-2 target nucleic acids are NOT DETECTED.  The SARS-CoV-2 RNA is generally detectable in upper and lower respiratory specimens during the acute phase of infection. The  lowest concentration of SARS-CoV-2 viral copies this assay can detect is 250 copies / mL. A negative result does not preclude SARS-CoV-2 infection and should not be used as the sole basis for treatment or other patient management decisions.  A negative result may occur with improper specimen collection / handling, submission of specimen other than nasopharyngeal swab, presence of viral mutation(s) within the areas targeted by this assay, and inadequate number of viral copies (<250 copies / mL). A negative result must be combined with clinical observations, patient history, and epidemiological information.  Fact Sheet for Patients:   https://www.patel.info/  Fact Sheet for Healthcare Providers: https://hall.com/  This test is not yet approved or  cleared by the [place] FDA and has been authorized for detection and/or diagnosis of SARS-CoV-2 by FDA under an Emergency Use Authorization (EUA).  This EUA will remain in effect (meaning this test can be used) for the duration of the COVID-19 declaration under Section 564(b)(1) of the Act, 21 U.S.C. section 360bbb-3(b)(1), unless the authorization is terminated or revoked sooner.  Performed at [facility], [address] [place], [place] [address]   Blood culture (single)     Status: None (Preliminary result)   Collection Time: 11/11/22  1:48 AM   Specimen: BLOOD  Result Value Ref Range Status   Specimen Description BLOOD LEFT  Final   Special Requests   Final    BOTTLES DRAWN AEROBIC AND ANAEROBIC Blood Culture results may not be optimal due to an excessive volume of blood received in culture bottles   Culture   Final    NO GROWTH < 12 HOURS Performed at [facility], [address] [place], [place] [address]    Report Status PENDING  Incomplete  Respiratory (~20 pathogens) panel by PCR     Status: None   Collection Time: 11/11/22  1:35 PM   Specimen:  Nasopharyngeal Swab; Respiratory  Result Value Ref Range Status   Adenovirus NOT DETECTED NOT DETECTED Final   Coronavirus 229E NOT DETECTED NOT DETECTED Final    Comment: (NOTE) The Coronavirus on the Respiratory Panel, DOES NOT test for the novel  Coronavirus (2019 nCoV)    Coronavirus HKU1 NOT DETECTED NOT DETECTED Final   Coronavirus NL63 NOT DETECTED NOT DETECTED Final   Coronavirus OC43 NOT DETECTED NOT DETECTED Final   Metapneumovirus NOT DETECTED NOT DETECTED Final   Rhinovirus / Enterovirus NOT DETECTED NOT DETECTED Final   Influenza A NOT DETECTED NOT DETECTED Final   Influenza B NOT DETECTED NOT DETECTED Final   Parainfluenza Virus 1 NOT DETECTED NOT DETECTED Final   Parainfluenza Virus 2 NOT DETECTED NOT DETECTED Final   Parainfluenza Virus 3 NOT DETECTED NOT DETECTED Final   Parainfluenza Virus 4 NOT DETECTED NOT DETECTED Final   Respiratory Syncytial Virus NOT DETECTED NOT DETECTED Final   Bordetella pertussis NOT DETECTED NOT DETECTED Final  Bordetella Parapertussis NOT DETECTED NOT DETECTED Final   Chlamydophila pneumoniae NOT DETECTED NOT DETECTED Final   Mycoplasma pneumoniae NOT DETECTED NOT DETECTED Final    Comment: Performed at [facility], [place] [address], [place], [place] [address]    Coagulation Studies: Recent Labs    11/11/22 0326  LABPROT 15.0  INR 1.2     Urinalysis: Recent Labs    11/10/22 1030  COLORURINE YELLOW*  LABSPEC 1.018  PHURINE 5.0  GLUCOSEU NEGATIVE  HGBUR NEGATIVE  BILIRUBINUR NEGATIVE  KETONESUR 5*  PROTEINUR >=300*  NITRITE NEGATIVE  LEUKOCYTESUR NEGATIVE       Imaging: DG Chest Port 1 View  Result Date: 11/12/2022 CLINICAL DATA:  Cough. EXAM: PORTABLE CHEST 1 VIEW COMPARISON:  November 11, 2022. FINDINGS: The heart size and mediastinal contours are within normal limits. Minimal bibasilar subsegmental atelectasis is noted. Moderate degenerative changes seen involving the left glenohumeral joint. IMPRESSION:  Minimal bibasilar subsegmental atelectasis. Electronically Signed   By: Marijo Conception M.D.   On: 11/12/2022 08:23   DG Chest 2 View  Result Date: 11/11/2022 CLINICAL DATA:  Weakness EXAM: CHEST - 2 VIEW COMPARISON:  12/16/2021 FINDINGS: The heart size and mediastinal contours are within normal limits. Both lungs are clear. The visualized skeletal structures are unremarkable. IMPRESSION: No active cardiopulmonary disease. Electronically Signed   By: Inez Catalina M.D.   On: 11/11/2022 01:20   CT ABDOMEN PELVIS WO CONTRAST  Result Date: 11/10/2022 CLINICAL DATA:  Abdominal pain, nausea/vomiting/diarrhea, peritoneal dialysis EXAM: CT ABDOMEN AND PELVIS WITHOUT CONTRAST TECHNIQUE: Multidetector CT imaging of the abdomen and pelvis was performed following the standard protocol without IV contrast. Unenhanced CT was performed per clinician order. Lack of IV contrast limits sensitivity and specificity, especially for evaluation of abdominal/pelvic solid viscera. RADIATION DOSE REDUCTION: This exam was performed according to the departmental dose-optimization program which includes automated exposure control, adjustment of the mA and/or kV according to patient size and/or use of iterative reconstruction technique. COMPARISON:  None Available. FINDINGS: Lower chest: No acute pleural or parenchymal lung disease. Dense calcification of the mitral annulus. Hepatobiliary: Cholecystectomy. Unremarkable unenhanced appearance of the liver. No biliary duct dilation. Pancreas: Unremarkable unenhanced appearance. Spleen: Unremarkable unenhanced appearance. Adrenals/Urinary Tract: Bilateral renal atrophy. No urinary tract calculi or obstructive uropathy. Bladder is decompressed, limiting its evaluation. The adrenals are unremarkable. Stomach/Bowel: No bowel obstruction or ileus. Scattered colonic diverticulosis without diverticulitis. No bowel wall thickening or inflammatory change. Vascular/Lymphatic: Aortic  atherosclerosis. No enlarged abdominal or pelvic lymph nodes. Reproductive: Status post hysterectomy. No adnexal masses. Other: Peritoneal dialysis catheter within the right lower quadrant, with peritoneal dialysis fluid throughout the abdomen pelvis. No free intraperitoneal gas. No abdominal wall hernia. Musculoskeletal: No acute or destructive bony lesions. Bilateral hip arthroplasties. Reconstructed images demonstrate no additional findings. IMPRESSION: 1. Free fluid throughout the abdomen and pelvis consistent with peritoneal dialysis. 2. No acute intra-abdominal or intrapelvic process. 3.  Aortic Atherosclerosis (ICD10-I70.0). Electronically Signed   By: Randa Ngo M.D.   On: 11/10/2022 23:38     Medications:    dialysis solution 1.5% low-MG/low-CA      sodium chloride   Intravenous Once   acetaminophen  650 mg Oral Once   amLODipine  5 mg Oral Daily   cefdinir  300 mg Oral QHS   cloNIDine  0.1 mg Oral BID   furosemide  80 mg Intravenous Once   gentamicin cream  1 Application Topical Daily   insulin aspart  0-5 Units Subcutaneous QHS  insulin aspart  0-6 Units Subcutaneous TID WC   insulin glargine-yfgn  5 Units Subcutaneous QHS   metroNIDAZOLE  500 mg Oral Q12H   multivitamin  1 tablet Oral Daily   pantoprazole  40 mg Oral Daily   saccharomyces boulardii  250 mg Oral QHS   sevelamer carbonate  2,400 mg Oral TID WC   venlafaxine XR  150 mg Oral Q breakfast   acetaminophen **OR** acetaminophen, HYDROcodone-acetaminophen, HYDROmorphone (DILAUDID) injection, ondansetron **OR** ondansetron (ZOFRAN) IV  Assessment/ Plan:  Ms. Felicia Thompson is a 76 y.o.  female with past medical history of diabetes, OSA, anemia, and ESRD on PD. Patient presents to the emergency room with nausea, vomiting, and diarrhea. Lower abdominal discomfort and weakness also present. She has been admitted for Acute encephalopathy [G93.40] Generalized weakness [R53.1] Sepsis ([place]) [A41.9] Sepsis without  acute organ dysfunction, due to unspecified organism ([place]) [A41.9]  [facility]/ 4 cycle/2 L feels  End-stage renal disease on peritoneal dialysis.  Will provide nightly treatments during this admission.  Patient tolerated treatment well overnight.  UF 500 mL achieved.  Will continue treatment with 1.5% dialysate tonight.  2. Anemia of chronic kidney disease Lab Results  Component Value Date   HGB 6.5 (L) 11/12/2022  Significant drop in hemoglobin today, 6.5.  Primary team has ordered 1 unit blood transfusion today.  Will monitor level in a.m. and consider subcu EPO.  3. Secondary Hyperparathyroidism:   Lab Results  Component Value Date   CALCIUM 7.2 (L) 11/12/2022   PHOS 5.2 (H) 12/18/2021    Will continue to monitor bone minerals.  Continue sevelamer once tolerating oral intake.  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 Home regimen includes NPH. Most recent hemoglobin A1c is 6.0 on 03/05/22.    Glucose stable.   LOS: 1 Felicia Thompson 12/12/20232:34 PM

## 2022-11-12 NOTE — Patient Outreach (Signed)
  Care Coordination   Case Collaboration  Visit Note   11/12/2022 Name: Naidelin Gugliotta MRN: [id number] DOB: Jan 23, 1946  Nikky Duba is a 76 y.o. year old female who sees Isaac Bliss, Rayford Halsted, MD for primary care. I  collaborated with inpatient CSW Dayton Scrape to review status of transportation resources.  What matters to the patients health and wellness today?  Resource Linking    Goals Addressed             This Visit's Progress    Care Coordination Activities       Care Coordination Interventions: Collaboration with Dayton Scrape inpatient LCSW to review status of transportation resources [employer] at [phone] and was advised the patient was not yet in the system Secure email sent to [place] requesting an update on application status Advised LCSW to notify [place] Coordination team if transportation is needed post discharge SW to continue to follow         SDOH assessments and interventions completed:  No     Care Coordination Interventions:  Yes, provided   Follow up plan:  SW will follow up with the patient post discharge    Encounter Outcome:  Pt. Visit Completed   Daneen Schick, Arita Miss, CDP Social Worker, Certified Dementia Practitioner [facility] Care Management  Care Coordination [phone]

## 2022-11-12 NOTE — Progress Notes (Signed)
PT completed PD last night with no issues. Exit site in perfect condition. Effluent yellow and clear with no fibrins.  UF = 503 ml  PT states she feels fine and is not feeling any pain.    11/12/22 0725  Completion  Treatment Status Complete  Initial Drain Volume 121  Average Dwell Time-Hour(s) 60  Average Drain Time 25  Total Therapy Volume 7999  Total Therapy Time-Hour(s) 9  Total Therapy Time-Min(s) 11  Effluent Appearance Yellow;Clear  Fluid Balance - CCPD  Total UF (+ value on cycler, pt loss) 503 mL  Procedure Comments  Tolerated treatment well? Yes  Peritoneal Dialysis Comments No issues, tolerated well, exit site perfect  Education / Care Plan  Dialysis Education Provided Yes

## 2022-11-12 NOTE — Progress Notes (Signed)
Progress Note   Patient: Felicia Thompson [id number]:[id number] DOB: 1946/10/24 DOA: 11/11/2022     1 DOS: the patient was seen and examined on 11/12/2022   Brief hospital course: 76 year old female with end-stage renal disease on peritoneal dialysis, hypertension, sleep apnea, type 2 diabetes mellitus with hyperlipidemia, depression, anemia.  She initially came in with leg pain, nausea vomiting and diarrhea and lower abdominal discomfort weakness and confusion.  Patient was brought in for gastroenteritis and improved with IV fluid hydration.  Patient has not had any further diarrhea since coming into the hospital.  Nephrology following for peritoneal dialysis.  12/12.  Hemoglobin dropped down to 6.5.  Ordered for 1 unit of packed red blood cells.  Patient denies any bleeding.  Likely dilutional from IV fluids.  Assessment and Plan: * Drop in hemoglobin Hemoglobin 9.1 on admission and down to 6.5 today.  Patient has not had any further bowel movements.  Denies bleeding.  Patient consented for 1 unit of packed red blood cells.  Recheck hemoglobin tomorrow.  Will hold aspirin and heparin subcutaneous currently.  Acute gastroenteritis Stool studies ordered but no further diarrhea yet.  Sepsis ruled out since I do not have a source of infection..  Leukocytosis tachycardia and hypotension.  Follow-up blood cultures and urine culture.  Patient on empiric antibiotics but will downgrade to oral antibiotics today.  Acute metabolic encephalopathy Mental status much improved from admission.  Hypotension Blood pressure improved with IV fluids and restarted Norvasc and clonidine.  Hypertension Restarted clonidine and Norvasc.  Uncontrolled type 2 diabetes mellitus with hyperglycemia, with long-term current use of insulin (HCC) Continue sliding scale insulin.  Hemoglobin A1c 7.7.  Will add low-dose Semglee insulin at night.  OSA (obstructive sleep apnea) CPAP nightly  ESRD on peritoneal dialysis  [facility]) Nephrology consult for continuation of PD        Subjective:   Physical Exam: Vitals:   11/11/22 1642 11/11/22 2124 11/12/22 0501 11/12/22 0740  BP: (!) 150/58 (!) 181/80 (!) 146/71 (!) 172/68  Pulse: 91 94 85 76  Resp: 18 20 20 18   Temp: 98.3 F (36.8 C) 98.1 F (36.7 C) 98.1 F (36.7 C) 98.2 F (36.8 C)  TempSrc: Oral Oral Oral   SpO2: 100% 99% 100% 100%  Weight:       Physical Exam HENT:     Head: Normocephalic.     Mouth/Throat:     Pharynx: No oropharyngeal exudate.  Eyes:     General: Lids are normal.     Conjunctiva/sclera: Conjunctivae normal.  Cardiovascular:     Rate and Rhythm: Normal rate and regular rhythm.     Heart sounds: Normal heart sounds, S1 normal and S2 normal.  Pulmonary:     Breath sounds: No decreased breath sounds, wheezing, rhonchi or rales.  Abdominal:     Palpations: Abdomen is soft.     Tenderness: There is no abdominal tenderness.  Musculoskeletal:     Right lower leg: No swelling.     Left lower leg: No swelling.  Skin:    General: Skin is warm.     Findings: No rash.  Neurological:     Mental Status: She is alert and oriented to person, place, and time.     Comments: Able to straight leg raise.     Data Reviewed: Hemoglobin 6.5, white blood cell count 7.3, platelet count 256, potassium 3.2, creatinine 12.49  Family Communication: Updated patient's daughter on the phone  Disposition: Status is: Inpatient Remains inpatient appropriate because:  Drop in hemoglobin down to 6.5 will give blood transfusion today  Planned Discharge Destination: Home    Time spent: 28 minutes  Author: Loletha Grayer, MD 11/12/2022 1:32 PM  For on call review [email or web address].

## 2022-11-12 NOTE — Progress Notes (Signed)
PT Cancellation Note  Patient Details Name: Felicia Thompson MRN: [id number] DOB: 1946/06/08   Cancelled Treatment:    Reason Eval/Treat Not Completed: Other (comment). Pt with low Hgb at 6.5 and is pending blood transfusion. Per documentation, pt is ambulating indep in room. Will attempt to screen once medically stable to see if therapy services indicated. Pt does nightly PD.   Else Habermann 11/12/2022, 11:10 AM Greggory Stallion, PT, DPT, GCS [phone]

## 2022-11-12 NOTE — Hospital Course (Signed)
76 year old female with end-stage renal disease on peritoneal dialysis, hypertension, sleep apnea, type 2 diabetes mellitus with hyperlipidemia, depression, anemia.  She initially came in with leg pain, nausea vomiting and diarrhea and lower abdominal discomfort weakness and confusion.  Patient was brought in for gastroenteritis and improved with IV fluid hydration.  Patient has not had any further diarrhea since coming into the hospital.  Nephrology following for peritoneal dialysis.  12/12.  Hemoglobin dropped down to 6.5.  Ordered for 1 unit of packed red blood cells.  Patient denies any bleeding.  Likely dilutional from IV fluids.

## 2022-11-12 NOTE — Plan of Care (Signed)
Problem: Education: Goal: Ability to describe self-care measures that may prevent or decrease complications (Diabetes Survival Skills Education) will improve 11/12/2022 0541 by Bonner Puna, RN Outcome: Progressing 11/12/2022 0539 by Bonner Puna, RN Outcome: Progressing Goal: Individualized Educational Video(s) 11/12/2022 0541 by Bonner Puna, RN Outcome: Progressing 11/12/2022 0539 by Bonner Puna, RN Outcome: Progressing   Problem: Coping: Goal: Ability to adjust to condition or change in health will improve 11/12/2022 0541 by Bonner Puna, RN Outcome: Progressing 11/12/2022 0539 by Bonner Puna, RN Outcome: Progressing   Problem: Fluid Volume: Goal: Ability to maintain a balanced intake and output will improve 11/12/2022 0541 by Bonner Puna, RN Outcome: Progressing 11/12/2022 0539 by Bonner Puna, RN Outcome: Progressing   Problem: Health Behavior/Discharge Planning: Goal: Ability to identify and utilize available resources and services will improve 11/12/2022 0541 by Bonner Puna, RN Outcome: Progressing 11/12/2022 0539 by Bonner Puna, RN Outcome: Progressing Goal: Ability to manage health-related needs will improve 11/12/2022 0541 by Bonner Puna, RN Outcome: Progressing 11/12/2022 0539 by Bonner Puna, RN Outcome: Progressing   Problem: Metabolic: Goal: Ability to maintain appropriate glucose levels will improve 11/12/2022 0541 by Bonner Puna, RN Outcome: Progressing 11/12/2022 0539 by Bonner Puna, RN Outcome: Progressing   Problem: Nutritional: Goal: Maintenance of adequate nutrition will improve 11/12/2022 0541 by Bonner Puna, RN Outcome: Progressing 11/12/2022 0539 by Bonner Puna, RN Outcome: Progressing Goal: Progress toward achieving an optimal weight will improve 11/12/2022 0541 by Bonner Puna, RN Outcome: Progressing 11/12/2022 0539 by Bonner Puna, RN Outcome: Progressing   Problem: Skin Integrity: Goal: Risk for impaired skin integrity will  decrease 11/12/2022 0541 by Bonner Puna, RN Outcome: Progressing 11/12/2022 0539 by Bonner Puna, RN Outcome: Progressing   Problem: Tissue Perfusion: Goal: Adequacy of tissue perfusion will improve 11/12/2022 0541 by Bonner Puna, RN Outcome: Progressing 11/12/2022 0539 by Bonner Puna, RN Outcome: Progressing   Problem: Fluid Volume: Goal: Hemodynamic stability will improve 11/12/2022 0541 by Bonner Puna, RN Outcome: Progressing 11/12/2022 0539 by Bonner Puna, RN Outcome: Progressing   Problem: Clinical Measurements: Goal: Diagnostic test results will improve 11/12/2022 0541 by Bonner Puna, RN Outcome: Progressing 11/12/2022 0539 by Bonner Puna, RN Outcome: Progressing Goal: Signs and symptoms of infection will decrease 11/12/2022 0541 by Bonner Puna, RN Outcome: Progressing 11/12/2022 0539 by Bonner Puna, RN Outcome: Progressing   Problem: Respiratory: Goal: Ability to maintain adequate ventilation will improve 11/12/2022 0541 by Bonner Puna, RN Outcome: Progressing 11/12/2022 0539 by Bonner Puna, RN Outcome: Progressing   Problem: Education: Goal: Knowledge of General Education information will improve Description: Including pain rating scale, medication(s)/side effects and non-pharmacologic comfort measures 11/12/2022 0541 by Bonner Puna, RN Outcome: Progressing 11/12/2022 0539 by Bonner Puna, RN Outcome: Progressing   Problem: Health Behavior/Discharge Planning: Goal: Ability to manage health-related needs will improve 11/12/2022 0541 by Bonner Puna, RN Outcome: Progressing 11/12/2022 0539 by Bonner Puna, RN Outcome: Progressing   Problem: Clinical Measurements: Goal: Ability to maintain clinical measurements within normal limits will improve 11/12/2022 0541 by Bonner Puna, RN Outcome: Progressing 11/12/2022 0539 by Bonner Puna, RN Outcome: Progressing Goal: Will remain free from infection 11/12/2022 0541 by Bonner Puna, RN Outcome:  Progressing 11/12/2022 0539 by Bonner Puna, RN Outcome: Progressing Goal: Diagnostic test results will improve 11/12/2022 0541 by Bonner Puna, RN Outcome: Progressing 11/12/2022 0539 by Bonner Puna, RN Outcome: Progressing Goal: Respiratory complications will improve 11/12/2022 0541 by Bonner Puna, RN Outcome: Progressing 11/12/2022 0539 by Bonner Puna, RN Outcome: Progressing Goal: Cardiovascular complication will be avoided 11/12/2022 0541 by Bonner Puna,  RN Outcome: Progressing 11/12/2022 0539 by Bonner Puna, RN Outcome: Progressing   Problem: Activity: Goal: Risk for activity intolerance will decrease 11/12/2022 0541 by Bonner Puna, RN Outcome: Progressing 11/12/2022 0539 by Bonner Puna, RN Outcome: Progressing   Problem: Nutrition: Goal: Adequate nutrition will be maintained 11/12/2022 0541 by Bonner Puna, RN Outcome: Progressing 11/12/2022 0539 by Bonner Puna, RN Outcome: Progressing   Problem: Coping: Goal: Level of anxiety will decrease 11/12/2022 0541 by Bonner Puna, RN Outcome: Progressing 11/12/2022 0539 by Bonner Puna, RN Outcome: Progressing   Problem: Elimination: Goal: Will not experience complications related to bowel motility 11/12/2022 0541 by Bonner Puna, RN Outcome: Progressing 11/12/2022 0539 by Bonner Puna, RN Outcome: Progressing Goal: Will not experience complications related to urinary retention 11/12/2022 0541 by Bonner Puna, RN Outcome: Progressing 11/12/2022 0539 by Bonner Puna, RN Outcome: Progressing   Problem: Pain Managment: Goal: General experience of comfort will improve 11/12/2022 0541 by Bonner Puna, RN Outcome: Progressing 11/12/2022 0539 by Bonner Puna, RN Outcome: Progressing   Problem: Safety: Goal: Ability to remain free from injury will improve 11/12/2022 0541 by Bonner Puna, RN Outcome: Progressing 11/12/2022 0539 by Bonner Puna, RN Outcome: Progressing   Problem: Skin Integrity: Goal: Risk for impaired skin  integrity will decrease 11/12/2022 0541 by Bonner Puna, RN Outcome: Progressing 11/12/2022 0539 by Bonner Puna, RN Outcome: Progressing

## 2022-11-12 NOTE — TOC CM/SW Note (Addendum)
SDOH flag for transportation. [place] social workers have been working with patient on this for the past few months. Daneen Schick completed the [employer] application and submitted it last month for processing. She contacted [employer] and they said application has not been approved yet. If patient has transportation needs for appointments after discharge, she said to let them know so they can coordinate until [employer] application is approved.  Dayton Scrape, [place]

## 2022-11-12 NOTE — Assessment & Plan Note (Addendum)
Hemoglobin 9.1 on admission and down to 6.5 today.  Patient has not had any further bowel movements.  Denies bleeding.  Patient consented for 1 unit of packed red blood cells.  Recheck hemoglobin tomorrow.  Will hold aspirin and heparin subcutaneous currently.

## 2022-11-12 NOTE — Plan of Care (Signed)

## 2022-11-13 DIAGNOSIS — D509 Iron deficiency anemia, unspecified: Secondary | ICD-10-CM | POA: Diagnosis not present

## 2022-11-13 DIAGNOSIS — N2589 Other disorders resulting from impaired renal tubular function: Secondary | ICD-10-CM | POA: Diagnosis not present

## 2022-11-13 DIAGNOSIS — R82998 Other abnormal findings in urine: Secondary | ICD-10-CM | POA: Diagnosis not present

## 2022-11-13 DIAGNOSIS — D631 Anemia in chronic kidney disease: Secondary | ICD-10-CM | POA: Diagnosis not present

## 2022-11-13 DIAGNOSIS — Z992 Dependence on renal dialysis: Secondary | ICD-10-CM | POA: Diagnosis not present

## 2022-11-13 DIAGNOSIS — K769 Liver disease, unspecified: Secondary | ICD-10-CM | POA: Diagnosis not present

## 2022-11-13 DIAGNOSIS — R710 Precipitous drop in hematocrit: Secondary | ICD-10-CM | POA: Diagnosis not present

## 2022-11-13 DIAGNOSIS — N2581 Secondary hyperparathyroidism of renal origin: Secondary | ICD-10-CM | POA: Diagnosis not present

## 2022-11-13 DIAGNOSIS — N186 End stage renal disease: Secondary | ICD-10-CM | POA: Diagnosis not present

## 2022-11-13 DIAGNOSIS — E1129 Type 2 diabetes mellitus with other diabetic kidney complication: Secondary | ICD-10-CM | POA: Diagnosis not present

## 2022-11-13 LAB — BPAM RBC
Blood Product Expiration Date: 202401142359
ISSUE DATE / TIME: 202312121520
Unit Type and Rh: 6200

## 2022-11-13 LAB — BASIC METABOLIC PANEL
Anion gap: 11 (ref 5–15)
BUN: 66 mg/dL — ABNORMAL HIGH (ref 8–23)
CO2: 25 mmol/L (ref 22–32)
Calcium: 7.2 mg/dL — ABNORMAL LOW (ref 8.9–10.3)
Chloride: 99 mmol/L (ref 98–111)
Creatinine, Ser: 11.84 mg/dL — ABNORMAL HIGH (ref 0.44–1.00)
GFR, Estimated: 3 mL/min — ABNORMAL LOW (ref >60)
Glucose, Bld: 214 mg/dL — ABNORMAL HIGH (ref 70–99)
Potassium: 3.4 mmol/L — ABNORMAL LOW (ref 3.5–5.1)
Sodium: 135 mmol/L (ref 135–145)

## 2022-11-13 LAB — CBC
HCT: 21.4 % — ABNORMAL LOW (ref 36.0–46.0)
Hemoglobin: 7.5 g/dL — ABNORMAL LOW (ref 12.0–15.0)
MCH: 31.1 pg (ref 26.0–34.0)
MCHC: 35 g/dL (ref 30.0–36.0)
MCV: 88.8 fL (ref 80.0–100.0)
Platelets: 279 10*3/uL (ref 150–400)
RBC: 2.41 MIL/uL — ABNORMAL LOW (ref 3.87–5.11)
RDW: 13.9 % (ref 11.5–15.5)
WBC: 7.3 10*3/uL (ref 4.0–10.5)
nRBC: 0 % (ref 0.0–0.2)

## 2022-11-13 LAB — TYPE AND SCREEN
ABO/RH(D): A POS
Antibody Screen: NEGATIVE
Unit division: 0

## 2022-11-13 LAB — HEPATITIS B SURFACE ANTIBODY, QUANTITATIVE: Hep B S AB Quant (Post): >1000 m[IU]/mL (ref >9.9)

## 2022-11-13 LAB — GLUCOSE, CAPILLARY
Glucose-Capillary: 136 mg/dL — ABNORMAL HIGH (ref 70–99)
Glucose-Capillary: 222 mg/dL — ABNORMAL HIGH (ref 70–99)

## 2022-11-13 MED ORDER — ONDANSETRON HCL 4 MG PO TABS: 4.0000 mg | ORAL_TABLET | Freq: Four times a day (QID) | ORAL | 0 refills | Status: AC | PRN

## 2022-11-13 MED ORDER — METRONIDAZOLE 500 MG PO TABS: 500.0000 mg | ORAL_TABLET | Freq: Two times a day (BID) | ORAL | 0 refills | Status: AC

## 2022-11-13 MED ORDER — CEFDINIR 300 MG PO CAPS: 300.0000 mg | ORAL_CAPSULE | Freq: Every day | ORAL | 0 refills | Status: AC

## 2022-11-13 NOTE — Progress Notes (Signed)
Pt discharged home in stable condition. Discharge instructions given. Pt verbalized understanding. No immediate questions or concerns at this time. Discharged from unit via wheelchair.  

## 2022-11-13 NOTE — Progress Notes (Signed)
Patient disconnected from PD treatment. Primary RN aware. Pt has no complaints of pain, PD catheter dressing clean, dry and intact. PD treatment was successful. Total Therapy Volume: 7953ml Total UF: 233 Total Therapy Time: 12 hr 18 min

## 2022-11-13 NOTE — Progress Notes (Signed)
[facility]  ROUNDING NOTE   Subjective:   Felicia Thompson is a 76 y.o. female with past medical history of diabetes, OSA, anemia, and ESRD on PD. Patient presents to the emergency room with nausea, vomiting, and diarrhea. Lower abdominal discomfort and weakness also present. She has been admitted for Acute encephalopathy [G93.40] Generalized weakness [R53.1] Sepsis ([place]) [A41.9] Sepsis without acute organ dysfunction, due to unspecified organism [facility]) [A41.9]  Patient is known to our clinic and is followed by [facility] and Dr Holley Raring. She states she has not missed any recent treatments.  Patient seen sitting up in bed Preparing to work with therapy Denies nausea or vomiting Tolerating meals PD treatment went well overnight, once alarm but resolved with repositioning.    Objective:  Vital signs in last 24 hours:  Temp:  [98.2 F (36.8 C)-98.6 F (37 C)] 98.2 F (36.8 C) (12/13 0601) Pulse Rate:  [81-97] 81 (12/13 0601) Resp:  [14-18] 18 (12/13 0601) BP: (172-193)/(70-72) 172/72 (12/13 0601) SpO2:  [98 %] 98 % (12/13 0601)  Weight change:  Filed Weights   11/10/22 1948  Weight: 80.3 kg    Intake/Output: I/O last 3 completed shifts: In: 583.8 [P.O.:480; I.V.:3.7; IV Piggyback:100.2] Out: 503 [Other:503]   Intake/Output this shift:  No intake/output data recorded.  Physical Exam: General: NAD  Head: Normocephalic, atraumatic.  Dry oral mucosal membranes  Eyes: Anicteric,  Lungs:  Clear to auscultation, normal effort, room air  Heart: Regular rate and rhythm  Abdomen:  Soft, nontender, obese, PDC  Extremities: No peripheral edema.  Neurologic: Nonfocal, moving all four extremities  Skin: No lesions  Access: PD catheter    Basic Metabolic Panel: Recent Labs  Lab 11/10/22 1951 11/11/22 0326 11/12/22 0837 11/13/22 0219  NA 137  --  135 135  K 4.4  --  3.2* 3.4*  CL 95*  --  102 99  CO2 22  --  23 25  GLUCOSE 247*  --  134* 214*   BUN 65*  --  64* 66*  CREATININE 13.38* 12.87* 12.49* 11.84*  CALCIUM 8.7*  --  7.2* 7.2*     Liver Function Tests: Recent Labs  Lab 11/10/22 1951  AST 24  ALT 22  ALKPHOS 66  BILITOT 0.7  PROT 6.8  ALBUMIN 3.3*    Recent Labs  Lab 11/10/22 1951  LIPASE 47    No results for input(s): "AMMONIA" in the last 168 hours.  CBC: Recent Labs  Lab 11/10/22 1951 11/12/22 0837 11/13/22 0219  WBC 15.3* 7.3 7.3  HGB 9.1* 6.5* 7.5*  HCT 27.1* 18.7* 21.4*  MCV 93.4 90.3 88.8  PLT 411* 256 279     Cardiac Enzymes: No results for input(s): "CKTOTAL", "CKMB", "CKMBINDEX", "TROPONINI" in the last 168 hours.  BNP: Invalid input(s): "POCBNP"  CBG: Recent Labs  Lab 11/12/22 1148 11/12/22 1631 11/12/22 2133 11/13/22 0851 11/13/22 1135  GLUCAP 174* 226* 231* 136* 38*     Microbiology: Results for orders placed or performed during the hospital encounter of 11/11/22  Blood culture (single)     Status: None (Preliminary result)   Collection Time: 11/11/22 12:52 AM   Specimen: Right Antecubital; Blood  Result Value Ref Range Status   Specimen Description RIGHT ANTECUBITAL  Final   Special Requests   Final    BOTTLES DRAWN AEROBIC AND ANAEROBIC Blood Culture results may not be optimal due to an excessive volume of blood received in culture bottles   Culture   Final  NO GROWTH 2 DAYS Performed at [facility], [address] [place], [place] [address]    Report Status PENDING  Incomplete  SARS Coronavirus 2 by RT PCR (hospital order, performed in [facility] hospital lab) *cepheid single result test* Anterior Nasal Swab     Status: None   Collection Time: 11/11/22 12:52 AM   Specimen: Anterior Nasal Swab  Result Value Ref Range Status   SARS Coronavirus 2 by RT PCR NEGATIVE NEGATIVE Final    Comment: (NOTE) SARS-CoV-2 target nucleic acids are NOT DETECTED.  The SARS-CoV-2 RNA is generally detectable in upper and lower respiratory specimens during the  acute phase of infection. The lowest concentration of SARS-CoV-2 viral copies this assay can detect is 250 copies / mL. A negative result does not preclude SARS-CoV-2 infection and should not be used as the sole basis for treatment or other patient management decisions.  A negative result may occur with improper specimen collection / handling, submission of specimen other than nasopharyngeal swab, presence of viral mutation(s) within the areas targeted by this assay, and inadequate number of viral copies (<250 copies / mL). A negative result must be combined with clinical observations, patient history, and epidemiological information.  Fact Sheet for Patients:   https://www.patel.info/  Fact Sheet for Healthcare Providers: https://hall.com/  This test is not yet approved or  cleared by the [place] FDA and has been authorized for detection and/or diagnosis of SARS-CoV-2 by FDA under an Emergency Use Authorization (EUA).  This EUA will remain in effect (meaning this test can be used) for the duration of the COVID-19 declaration under Section 564(b)(1) of the Act, 21 U.S.C. section 360bbb-3(b)(1), unless the authorization is terminated or revoked sooner.  Performed at [facility], [address] [place], [place] [address]   Blood culture (single)     Status: None (Preliminary result)   Collection Time: 11/11/22  1:48 AM   Specimen: BLOOD  Result Value Ref Range Status   Specimen Description BLOOD LEFT  Final   Special Requests   Final    BOTTLES DRAWN AEROBIC AND ANAEROBIC Blood Culture results may not be optimal due to an excessive volume of blood received in culture bottles   Culture   Final    NO GROWTH 2 DAYS Performed at [facility], [address] [place], [place] [address]    Report Status PENDING  Incomplete  Respiratory (~20 pathogens) panel by PCR     Status: None   Collection Time: 11/11/22  1:35  PM   Specimen: Nasopharyngeal Swab; Respiratory  Result Value Ref Range Status   Adenovirus NOT DETECTED NOT DETECTED Final   Coronavirus 229E NOT DETECTED NOT DETECTED Final    Comment: (NOTE) The Coronavirus on the Respiratory Panel, DOES NOT test for the novel  Coronavirus (2019 nCoV)    Coronavirus HKU1 NOT DETECTED NOT DETECTED Final   Coronavirus NL63 NOT DETECTED NOT DETECTED Final   Coronavirus OC43 NOT DETECTED NOT DETECTED Final   Metapneumovirus NOT DETECTED NOT DETECTED Final   Rhinovirus / Enterovirus NOT DETECTED NOT DETECTED Final   Influenza A NOT DETECTED NOT DETECTED Final   Influenza B NOT DETECTED NOT DETECTED Final   Parainfluenza Virus 1 NOT DETECTED NOT DETECTED Final   Parainfluenza Virus 2 NOT DETECTED NOT DETECTED Final   Parainfluenza Virus 3 NOT DETECTED NOT DETECTED Final   Parainfluenza Virus 4 NOT DETECTED NOT DETECTED Final   Respiratory Syncytial Virus NOT DETECTED NOT DETECTED Final  Bordetella pertussis NOT DETECTED NOT DETECTED Final   Bordetella Parapertussis NOT DETECTED NOT DETECTED Final   Chlamydophila pneumoniae NOT DETECTED NOT DETECTED Final   Mycoplasma pneumoniae NOT DETECTED NOT DETECTED Final    Comment: Performed at [facility], [place] [address], [place], [place] [address]    Coagulation Studies: Recent Labs    11/11/22 0326  LABPROT 15.0  INR 1.2     Urinalysis: No results for input(s): "COLORURINE", "LABSPEC", "PHURINE", "GLUCOSEU", "HGBUR", "BILIRUBINUR", "KETONESUR", "PROTEINUR", "UROBILINOGEN", "NITRITE", "LEUKOCYTESUR" in the last 72 hours.  Invalid input(s): "APPERANCEUR"     Imaging: DG Chest Port 1 View  Result Date: 11/12/2022 CLINICAL DATA:  Cough. EXAM: PORTABLE CHEST 1 VIEW COMPARISON:  November 11, 2022. FINDINGS: The heart size and mediastinal contours are within normal limits. Minimal bibasilar subsegmental atelectasis is noted. Moderate degenerative changes seen involving the left glenohumeral  joint. IMPRESSION: Minimal bibasilar subsegmental atelectasis. Electronically Signed   By: Marijo Conception M.D.   On: 11/12/2022 08:23     Medications:         Assessment/ Plan:  Ms. Rayen Palen is a 76 y.o.  female with past medical history of diabetes, OSA, anemia, and ESRD on PD. Patient presents to the emergency room with nausea, vomiting, and diarrhea. Lower abdominal discomfort and weakness also present. She has been admitted for Acute encephalopathy [G93.40] Generalized weakness [R53.1] Sepsis ([place]) [A41.9] Sepsis without acute organ dysfunction, due to unspecified organism ([place]) [A41.9]  [facility]/ 4 cycle/2 L feels  End-stage renal disease on peritoneal dialysis.  Will provide nightly treatments during this admission.  Treatment went well overnight, UF 224ml. Patient instructed to visit her clinic at discharge for staff to assess supply needs and PDC cap.   2. Anemia of chronic kidney disease Lab Results  Component Value Date   HGB 7.5 (L) 11/13/2022  Significant drop in hemoglobin today, 6.5.  Patient has received blood transfusion during this admission..  Hdb remains decreased but stable.   3. Secondary Hyperparathyroidism:   Lab Results  Component Value Date   CALCIUM 7.2 (L) 11/13/2022   PHOS 5.2 (H) 12/18/2021    Will continue to monitor bone minerals. Calcium decreased, but should improve with oral intake. Continue sevelamer  oral intake.  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 Home regimen includes NPH. Most recent hemoglobin A1c is 6.0 on 03/05/22.    Well controlled with sliding scale insuline, managed by primary team.   LOS: 2 [place] 12/13/20238:10 PM

## 2022-11-13 NOTE — Discharge Summary (Signed)
Physician Discharge Summary  Felicia Thompson [facility] GMW:[id number] DOB: 09/27/1946 DOA: 11/11/2022  PCP: Isaac Bliss, Rayford Halsted, MD  Admit date: 11/11/2022 Discharge date: 11/13/2022  Admitted From: Home Disposition: Home  Recommendations for Outpatient Follow-up:  Follow up with PCP in 1-2 weeks   Home Health: No Equipment/Devices: None  Discharge Condition: Stable CODE STATUS: Full Diet recommendation: Carb modified  Brief/Interim Summary: 76 year old female with end-stage renal disease on peritoneal dialysis, hypertension, sleep apnea, type 2 diabetes mellitus with hyperlipidemia, depression, anemia.  She initially came in with leg pain, nausea vomiting and diarrhea and lower abdominal discomfort weakness and confusion.  Patient was brought in for gastroenteritis and improved with IV fluid hydration.  Patient has not had any further diarrhea since coming into the hospital.  Nephrology following for peritoneal dialysis.   12/12.  Hemoglobin dropped down to 6.5.  Ordered for 1 unit of packed red blood cells.  Patient denies any bleeding.  Likely dilutional from IV fluids. 12/13: Hemoglobin stable at 7.4.  No further transfusion at this time.  Patient tolerating p.o. intake without nausea vomiting or abdominal pain.  Stable for discharge home at this time.  Empiric antibiotics prescribed.  As needed antiemetics prescribed.  Follow-up outpatient PCP.  Resume home peritoneal dialysis schedule.    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  Dropped to 6.5.  No further bowel movements.  No obvious source of bleeding.   Unclear whether this is dilutional.  Suspected 9.1 represented hemoconcentration.  Received 1 unit PRBC.  Hemoglobin 7.4 at time of discharge.    Acute gastroenteritis Unclear etiology.  Sepsis ruled out.  No clear source of infection.  All cultures no growth to date.  Will prescribe empiric antibiotics to complete a total 7-day course.   Acute metabolic encephalopathy Mental status much improved from admission.  Baseline at time of discharge   Hypotension Resolved.  Can resume home antihypertensive regimen   Hypertension Restarted clonidine and Norvasc.   Uncontrolled type 2 diabetes mellitus with hyperglycemia, with long-term current use of insulin ([place]) Resume home regimen.  Hemoglobin A1c 7.7.   OSA (obstructive sleep apnea) CPAP nightly   ESRD on peritoneal dialysis [name]) Nephrology following for inpatient PD needs  Discharge Instructions  Discharge Instructions     Diet - low sodium heart healthy   Complete by: As directed    Increase activity slowly   Complete by: As directed    No wound care   Complete by: As directed       Allergies as of 11/13/2022       Reactions   Tape Other (See Comments)   Blisters CAN USE PAPER TAPE   Tetracyclines & Related Rash        Medication List     TAKE these medications    amLODipine 5 MG tablet Commonly known as: NORVASC Take 5 mg by mouth daily.   aspirin 81 MG chewable tablet Chew 81 mg by mouth daily.   cefdinir 300 MG capsule Commonly known as: OMNICEF Take 1 capsule (300 mg total) by mouth at bedtime for 5 days.   cloNIDine 0.1 MG tablet Commonly known as: CATAPRES  Take 0.1 mg by mouth 2 (two) times daily.   [employer] ADV PROBIOTIC GUMMIES PO Take 1 tablet by mouth at bedtime.   diphenhydramine-acetaminophen 25-500 MG Tabs tablet Commonly known as: TYLENOL PM Take 1 tablet by mouth at bedtime as needed.   HumuLIN 70/30 (70-30) 100 UNIT/ML injection Generic drug: insulin NPH-regular Human Inject 10  Units into the skin daily with breakfast. What changed: when to take this   losartan 50 MG tablet Commonly known as: COZAAR Take 1 tablet (50 mg total) by mouth daily. What changed: when to take this   metroNIDAZOLE 500 MG tablet Commonly known as: FLAGYL Take 1 tablet (500 mg total) by mouth every 12 (twelve) hours for 10 doses.   ondansetron 4 MG tablet Commonly known as: ZOFRAN Take 1 tablet (4 mg total) by mouth every 6 (six) hours as needed for nausea.   RenaPlex Tabs Take 1 tablet by mouth daily.   sevelamer carbonate 800 MG tablet Commonly known as: RENVELA Take 2,400 mg by mouth 3 (three) times daily with meals.   venlafaxine XR 150 MG 24 hr capsule Commonly known as: EFFEXOR-XR Take 1 capsule (150 mg total) by mouth daily with breakfast.        Allergies  Allergen Reactions   Tape Other (See Comments)    Blisters CAN USE PAPER TAPE   Tetracyclines & Related Rash    Consultations: Nephrology   Procedures/Studies: DG Chest Port 1 View  Result Date: 11/12/2022 CLINICAL DATA:  Cough. EXAM: PORTABLE CHEST 1 VIEW COMPARISON:  November 11, 2022. FINDINGS: The heart size and mediastinal contours are within normal limits. Minimal bibasilar subsegmental atelectasis is noted. Moderate degenerative changes seen involving the left glenohumeral joint. IMPRESSION: Minimal bibasilar subsegmental atelectasis. Electronically Signed   By: Marijo Conception M.D.   On: 11/12/2022 08:23   DG Chest 2 View  Result Date: 11/11/2022 CLINICAL DATA:  Weakness EXAM: CHEST - 2 VIEW COMPARISON:  12/16/2021 FINDINGS: The heart size and mediastinal contours are within normal limits. Both lungs are clear. The visualized skeletal structures are unremarkable. IMPRESSION: No active cardiopulmonary disease. Electronically Signed   By: Inez Catalina M.D.   On: 11/11/2022 01:20   CT ABDOMEN PELVIS WO CONTRAST  Result Date: 11/10/2022 CLINICAL DATA:  Abdominal pain, nausea/vomiting/diarrhea,  peritoneal dialysis EXAM: CT ABDOMEN AND PELVIS WITHOUT CONTRAST TECHNIQUE: Multidetector CT imaging of the abdomen and pelvis was performed following the standard protocol without IV contrast. Unenhanced CT was performed per clinician order. Lack of IV contrast limits sensitivity and specificity, especially for evaluation of abdominal/pelvic solid viscera. RADIATION DOSE REDUCTION: This exam was performed according to the departmental dose-optimization program which includes automated exposure control, adjustment of the mA and/or kV according to patient size and/or use of iterative reconstruction technique. COMPARISON:  None Available. FINDINGS: Lower chest: No acute pleural or parenchymal lung disease. Dense calcification of the mitral annulus. Hepatobiliary: Cholecystectomy. Unremarkable unenhanced appearance of the liver. No biliary duct dilation. Pancreas: Unremarkable unenhanced appearance. Spleen: Unremarkable unenhanced appearance. Adrenals/Urinary Tract: Bilateral renal atrophy. No urinary tract calculi or obstructive uropathy. Bladder is decompressed, limiting its evaluation. The adrenals are unremarkable. Stomach/Bowel: No bowel obstruction or ileus. Scattered colonic diverticulosis without diverticulitis. No bowel wall thickening or inflammatory change. Vascular/Lymphatic: Aortic atherosclerosis. No enlarged abdominal or pelvic lymph nodes. Reproductive: Status post hysterectomy. No adnexal masses. Other: Peritoneal dialysis catheter within the right lower quadrant, with peritoneal dialysis fluid throughout the abdomen pelvis. No free intraperitoneal gas. No abdominal wall hernia. Musculoskeletal: No  acute or destructive bony lesions. Bilateral hip arthroplasties. Reconstructed images demonstrate no additional findings. IMPRESSION: 1. Free fluid throughout the abdomen and pelvis consistent with peritoneal dialysis. 2. No acute intra-abdominal or intrapelvic process. 3.  Aortic Atherosclerosis  (ICD10-I70.0). Electronically Signed   By: Randa Ngo M.D.   On: 11/10/2022 23:38   CT Temporal Bones Wo Contrast  Result Date: 10/24/2022 CLINICAL DATA:  76 year old female with left ear pain. Sudden severe headache. EXAM: CT TEMPORAL BONES WITHOUT CONTRAST TECHNIQUE: Axial and coronal plane CT imaging of the petrous temporal bones was performed with thin-collimation image reconstruction. No intravenous contrast was administered. Multiplanar CT image reconstructions were also generated. RADIATION DOSE REDUCTION: This exam was performed according to the departmental dose-optimization program which includes automated exposure control, adjustment of the mA and/or kV according to patient size and/or use of iterative reconstruction technique. COMPARISON:  Head CT today reported separately. FINDINGS: RIGHT TEMPORAL BONE Debris in the right external auditory canal, possibly cerumen. EAC otherwise normal. Right tympanic membrane appears gracile, within normal limits. Right tympanic cavity is clear. Right scutum and ossicles appear intact and aligned. Right mastoid antrum and air cells are clear. Right internal auditory canal, cochlea, vestibule and vestibular aqueduct are within normal limits. Right side semicircular canals and course of the right 7th nerve are normal. LEFT TEMPORAL BONE Confluent debris/material in the medial left external auditory canal inseparable from the left tympanic membrane which is obscured and/or abnormally thickened (series 8, image 53). Partial opacification of the left tympanic cavity, mostly along the ossicles and at the sinus tympani and facial recess. The ossicles seem to remain intact and aligned. Left scutum is intact. Prussak's space is largely clear. Left mastoid antrum is clear but there is scattered air cell opacification and small fluid levels. No mastoid erosion or coalescence. Left IAC, cochlea, vestibule, vestibular aqueduct and left semicircular canals are within normal  limits. Vascular: Calcified atherosclerosis at the skull base. Limited intracranial:  Reported separately today. Visible orbits/paranasal sinuses: Visualized orbit soft tissues are within normal limits. Left paranasal sinus pansinus inflammation. Some bubbly opacity in the left sphenoid which demonstrates chronic periosteal thickening also. Right paranasal sinuses appear clear. Soft tissues: Visible noncontrast nasopharynx appears symmetric and normal. Superficial periauricular soft tissues appears symmetric and within normal limits. IMPRESSION: 1. Partial opacification of the left external auditory canal, the left tympanic cavity, and mastoids. Abnormally thickened TM. But no bone erosion or other complicating features. This may reflect a combination of infectious otitis externa/otitis media. 2. Superimposed acute on chronic Left paranasal sinus inflammation. 3. Normal CT appearance of the right temporal bone. Electronically Signed   By: Genevie Ann M.D.   On: 10/24/2022 09:18   CT HEAD WO CONTRAST (5MM)  Result Date: 10/24/2022 CLINICAL DATA:  76 year old female with left ear pain. Sudden severe headache. EXAM: CT HEAD WITHOUT CONTRAST TECHNIQUE: Contiguous axial images were obtained from the base of the skull through the vertex without intravenous contrast. RADIATION DOSE REDUCTION: This exam was performed according to the departmental dose-optimization program which includes automated exposure control, adjustment of the mA and/or kV according to patient size and/or use of iterative reconstruction technique. COMPARISON:  Temporal bone CT today reported separately. Brain MRI 09/14/2021 and head CT 09/24/2021. FINDINGS: Brain: Stable cerebral volume from last year. No midline shift, mass effect, or evidence of intracranial mass lesion. No ventriculomegaly. No acute intracranial hemorrhage identified. Stable gray-white matter differentiation throughout the brain. Patchy bilateral cerebral white matter hypodensity.  No cortically based acute infarct  identified. Vascular: No suspicious intracranial vascular hyperdensity. Calcified atherosclerosis at the skull base. Skull: No acute osseous abnormality identified. Sinuses/Orbits: abnormal left tympanic cavity and mastoids are detailed separately today. 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 And new mucosal thickening in the left ethmoid air cells, left frontoethmoidal recess and mildly involving the visible left maxillary sinus. Other: No acute orbit or scalp soft tissue finding. Negative visible noncontrast nasopharynx. IMPRESSION: 1. No acute intracranial abnormality. 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 2. New/increased left paranasal sinus inflammation since last year, and abnormal left tympanic cavity and mastoids detailed on Temporal Bone CT separately today. Electronically Signed   By: Genevie Ann M.D.   On: 10/24/2022 09:12      Subjective: Seen and examined at the time of discharge.  Stable no distress.  Stable for discharge home.  Discharge Exam: Vitals:   11/12/22 2041 11/13/22 0601  BP: (!) 193/70 (!) 172/72  Pulse: 97 81  Resp: 14 18  Temp: 98.6 F (37 C) 98.2 F (36.8 C)  SpO2: 98% 98%   Vitals:   11/12/22 1551 11/12/22 1841 11/12/22 2041 11/13/22 0601  BP: (!) 170/70 (!) 174/66 (!) 193/70 (!) 172/72  Pulse: 88 96 97 81  Resp: 16 16 14 18   Temp: 98.4 F (36.9 C)  98.6 F (37 C) 98.2 F (36.8 C)  TempSrc: Oral Oral Oral   SpO2: 96% 99% 98% 98%  Weight:        General: Pt is alert, awake, not in acute distress Cardiovascular: RRR, S1/S2 +, no rubs, no gallops Respiratory: CTA bilaterally, no wheezing, no rhonchi Abdominal: Soft, NT, ND, bowel sounds + Extremities: no edema, no cyanosis    The results of significant diagnostics from this hospitalization (including imaging, microbiology, ancillary and laboratory) are listed below for  reference.     Microbiology: Recent Results (from the past 240 hour(s))  Blood culture (single)     Status: None (Preliminary result)   Collection Time: 11/11/22 12:52 AM   Specimen: Right Antecubital; Blood  Result Value Ref Range Status   Specimen Description RIGHT ANTECUBITAL  Final   Special Requests   Final    BOTTLES DRAWN AEROBIC AND ANAEROBIC Blood Culture results may not be optimal due to an excessive volume of blood received in culture bottles   Culture   Final    NO GROWTH 2 DAYS Performed at [facility], [address] [place], [place] [address]    Report Status PENDING  Incomplete  SARS Coronavirus 2 by RT PCR (hospital order, performed in [place] hospital lab) *cepheid single result test* Anterior Nasal Swab     Status: None   Collection Time: 11/11/22 12:52 AM   Specimen: Anterior Nasal Swab  Result Value Ref Range Status   SARS Coronavirus 2 by RT PCR NEGATIVE NEGATIVE Final    Comment: (NOTE) SARS-CoV-2 target nucleic acids are NOT DETECTED.  The SARS-CoV-2 RNA is generally detectable in upper and lower respiratory specimens during the acute phase of infection. The lowest concentration of SARS-CoV-2 viral copies this assay can detect is 250 copies / mL. A negative result does not preclude SARS-CoV-2 infection and should not be used as the sole basis for treatment or other patient management decisions.  A negative result may occur with improper specimen collection / handling, submission of specimen other than nasopharyngeal swab, presence of viral mutation(s) within the areas targeted by this assay, and inadequate  number of viral copies (<250 copies / mL). A negative result must be combined with clinical observations, patient history, and epidemiological information.  Fact Sheet for Patients:   https://www.patel.info/  Fact Sheet for Healthcare Providers: https://hall.com/  This test is not yet  approved or  cleared by the [place] FDA and has been authorized for detection and/or diagnosis of SARS-CoV-2 by FDA under an Emergency Use Authorization (EUA).  This EUA will remain in effect (meaning this test can be used) for the duration of the COVID-19 declaration under Section 564(b)(1) of the Act, 21 U.S.C. section 360bbb-3(b)(1), unless the authorization is terminated or revoked sooner.  Performed at [facility], [address] [place], [place] [address]   Blood culture (single)     Status: None (Preliminary result)   Collection Time: 11/11/22  1:48 AM   Specimen: BLOOD  Result Value Ref Range Status   Specimen Description BLOOD LEFT  Final   Special Requests   Final    BOTTLES DRAWN AEROBIC AND ANAEROBIC Blood Culture results may not be optimal due to an excessive volume of blood received in culture bottles   Culture   Final    NO GROWTH 2 DAYS Performed at [facility], [address] [place], [place] [address]    Report Status PENDING  Incomplete  Respiratory (~20 pathogens) panel by PCR     Status: None   Collection Time: 11/11/22  1:35 PM   Specimen: Nasopharyngeal Swab; Respiratory  Result Value Ref Range Status   Adenovirus NOT DETECTED NOT DETECTED Final   Coronavirus 229E NOT DETECTED NOT DETECTED Final    Comment: (NOTE) The Coronavirus on the Respiratory Panel, DOES NOT test for the novel  Coronavirus (2019 nCoV)    Coronavirus HKU1 NOT DETECTED NOT DETECTED Final   Coronavirus NL63 NOT DETECTED NOT DETECTED Final   Coronavirus OC43 NOT DETECTED NOT DETECTED Final   Metapneumovirus NOT DETECTED NOT DETECTED Final   Rhinovirus / Enterovirus NOT DETECTED NOT DETECTED Final   Influenza A NOT DETECTED NOT DETECTED Final   Influenza B NOT DETECTED NOT DETECTED Final   Parainfluenza Virus 1 NOT DETECTED NOT DETECTED Final   Parainfluenza Virus 2 NOT DETECTED NOT DETECTED Final   Parainfluenza Virus 3 NOT DETECTED NOT DETECTED Final    Parainfluenza Virus 4 NOT DETECTED NOT DETECTED Final   Respiratory Syncytial Virus NOT DETECTED NOT DETECTED Final   Bordetella pertussis NOT DETECTED NOT DETECTED Final   Bordetella Parapertussis NOT DETECTED NOT DETECTED Final   Chlamydophila pneumoniae NOT DETECTED NOT DETECTED Final   Mycoplasma pneumoniae NOT DETECTED NOT DETECTED Final    Comment: Performed at [facility], [place] [address], [place], [place] [address]     Labs: BNP (last 3 results) Recent Labs    12/16/21 2334  BNP 630.1*   Basic Metabolic Panel: Recent Labs  Lab 11/10/22 1951 11/11/22 0326 11/12/22 0837 11/13/22 0219  NA 137  --  135 135  K 4.4  --  3.2* 3.4*  CL 95*  --  102 99  CO2 22  --  23 25  GLUCOSE 247*  --  134* 214*  BUN 65*  --  64* 66*  CREATININE 13.38* 12.87* 12.49* 11.84*  CALCIUM 8.7*  --  7.2* 7.2*   Liver Function Tests: Recent Labs  Lab 11/10/22 1951  AST 24  ALT 22  ALKPHOS 66  BILITOT 0.7  PROT 6.8  ALBUMIN 3.3*   Recent Labs  Lab 11/10/22  1951  LIPASE 47   No results for input(s): "AMMONIA" in the last 168 hours. CBC: Recent Labs  Lab 11/10/22 1951 11/12/22 0837 11/13/22 0219  WBC 15.3* 7.3 7.3  HGB 9.1* 6.5* 7.5*  HCT 27.1* 18.7* 21.4*  MCV 93.4 90.3 88.8  PLT 411* 256 279   Cardiac Enzymes: No results for input(s): "CKTOTAL", "CKMB", "CKMBINDEX", "TROPONINI" in the last 168 hours. BNP: Invalid input(s): "POCBNP" CBG: Recent Labs  Lab 11/12/22 1148 11/12/22 1631 11/12/22 2133 11/13/22 0851 11/13/22 1135  GLUCAP 174* 226* 231* 136* 222*   D-Dimer No results for input(s): "DDIMER" in the last 72 hours. Hgb A1c Recent Labs    11/11/22 0326  HGBA1C 7.7*   Lipid Profile No results for input(s): "CHOL", "HDL", "LDLCALC", "TRIG", "CHOLHDL", "LDLDIRECT" in the last 72 hours. Thyroid function studies No results for input(s): "TSH", "T4TOTAL", "T3FREE", "THYROIDAB" in the last 72 hours.  Invalid input(s): "FREET3" Anemia work up Recent  Labs    11/12/22 1111  FERRITIN 397*   Urinalysis    Component Value Date/Time   COLORURINE YELLOW (A) 11/10/2022 1030   APPEARANCEUR CLOUDY (A) 11/10/2022 1030   LABSPEC 1.018 11/10/2022 1030   PHURINE 5.0 11/10/2022 1030   GLUCOSEU NEGATIVE 11/10/2022 1030   HGBUR NEGATIVE 11/10/2022 1030   BILIRUBINUR NEGATIVE 11/10/2022 1030   KETONESUR 5 (A) 11/10/2022 1030   PROTEINUR >=300 (A) 11/10/2022 1030   NITRITE NEGATIVE 11/10/2022 1030   LEUKOCYTESUR NEGATIVE 11/10/2022 1030   Sepsis Labs Recent Labs  Lab 11/10/22 1951 11/12/22 0837 11/13/22 0219  WBC 15.3* 7.3 7.3   Microbiology Recent Results (from the past 240 hour(s))  Blood culture (single)     Status: None (Preliminary result)   Collection Time: 11/11/22 12:52 AM   Specimen: Right Antecubital; Blood  Result Value Ref Range Status   Specimen Description RIGHT ANTECUBITAL  Final   Special Requests   Final    BOTTLES DRAWN AEROBIC AND ANAEROBIC Blood Culture results may not be optimal due to an excessive volume of blood received in culture bottles   Culture   Final    NO GROWTH 2 DAYS Performed at [facility], [address] [place], [place] [address]    Report Status PENDING  Incomplete  SARS Coronavirus 2 by RT PCR (hospital order, performed in [place] hospital lab) *cepheid single result test* Anterior Nasal Swab     Status: None   Collection Time: 11/11/22 12:52 AM   Specimen: Anterior Nasal Swab  Result Value Ref Range Status   SARS Coronavirus 2 by RT PCR NEGATIVE NEGATIVE Final    Comment: (NOTE) SARS-CoV-2 target nucleic acids are NOT DETECTED.  The SARS-CoV-2 RNA is generally detectable in upper and lower respiratory specimens during the acute phase of infection. The lowest concentration of SARS-CoV-2 viral copies this assay can detect is 250 copies / mL. A negative result does not preclude SARS-CoV-2 infection and should not be used as the sole basis for treatment or other patient  management decisions.  A negative result may occur with improper specimen collection / handling, submission of specimen other than nasopharyngeal swab, presence of viral mutation(s) within the areas targeted by this assay, and inadequate number of viral copies (<250 copies / mL). A negative result must be combined with clinical observations, patient history, and epidemiological information.  Fact Sheet for Patients:   https://www.patel.info/  Fact Sheet for Healthcare Providers: https://hall.com/  This test is not yet approved or  cleared by the [place] FDA  and has been authorized for detection and/or diagnosis of SARS-CoV-2 by FDA under an Emergency Use Authorization (EUA).  This EUA will remain in effect (meaning this test can be used) for the duration of the COVID-19 declaration under Section 564(b)(1) of the Act, 21 U.S.C. section 360bbb-3(b)(1), unless the authorization is terminated or revoked sooner.  Performed at [facility], [address] [place], [place] [address]   Blood culture (single)     Status: None (Preliminary result)   Collection Time: 11/11/22  1:48 AM   Specimen: BLOOD  Result Value Ref Range Status   Specimen Description BLOOD LEFT  Final   Special Requests   Final    BOTTLES DRAWN AEROBIC AND ANAEROBIC Blood Culture results may not be optimal due to an excessive volume of blood received in culture bottles   Culture   Final    NO GROWTH 2 DAYS Performed at [facility], [address] [place], [place] [address]    Report Status PENDING  Incomplete  Respiratory (~20 pathogens) panel by PCR     Status: None   Collection Time: 11/11/22  1:35 PM   Specimen: Nasopharyngeal Swab; Respiratory  Result Value Ref Range Status   Adenovirus NOT DETECTED NOT DETECTED Final   Coronavirus 229E NOT DETECTED NOT DETECTED Final    Comment: (NOTE) The Coronavirus on the Respiratory Panel, DOES NOT  test for the novel  Coronavirus (2019 nCoV)    Coronavirus HKU1 NOT DETECTED NOT DETECTED Final   Coronavirus NL63 NOT DETECTED NOT DETECTED Final   Coronavirus OC43 NOT DETECTED NOT DETECTED Final   Metapneumovirus NOT DETECTED NOT DETECTED Final   Rhinovirus / Enterovirus NOT DETECTED NOT DETECTED Final   Influenza A NOT DETECTED NOT DETECTED Final   Influenza B NOT DETECTED NOT DETECTED Final   Parainfluenza Virus 1 NOT DETECTED NOT DETECTED Final   Parainfluenza Virus 2 NOT DETECTED NOT DETECTED Final   Parainfluenza Virus 3 NOT DETECTED NOT DETECTED Final   Parainfluenza Virus 4 NOT DETECTED NOT DETECTED Final   Respiratory Syncytial Virus NOT DETECTED NOT DETECTED Final   Bordetella pertussis NOT DETECTED NOT DETECTED Final   Bordetella Parapertussis NOT DETECTED NOT DETECTED Final   Chlamydophila pneumoniae NOT DETECTED NOT DETECTED Final   Mycoplasma pneumoniae NOT DETECTED NOT DETECTED Final    Comment: Performed at [facility], [place] [address] [place], [place] [address]     Time coordinating discharge: Over 30 minutes  SIGNED:   Sidney Ace, MD  [facility] Hospitalists 11/13/2022, 1:54 PM Pager   If 7PM-7AM, please contact night-coverage

## 2022-11-13 NOTE — TOC Transition Note (Signed)
Transition of Care [facility]) - CM/SW Discharge Note   Patient Details  Name: Felicia Thompson MRN: [id number] Date of Birth: 09-16-1946  Transition of Care [facility]) CM/SW Contact:  Candie Chroman, LCSW Phone Number: 11/13/2022, 2:03 PM   Clinical Narrative: Patient has orders to discharge home today. Patient confirmed she is not interested in outpatient PT at this time. Updated her regarding conversation with [facility] social workers yesterday. She expressed understanding. No further concerns. Her daughter will transport her home today. CSW signing off.  Final next level of care: Home/Self Care Barriers to Discharge: No Barriers Identified   Patient Goals and CMS Choice        Discharge Placement                Patient to be transferred to facility by: Daughter   Patient and family notified of of transfer: 11/13/22  Discharge Plan and Services                                     Social Determinants of Health (SDOH) Interventions Transportation Interventions: Inpatient TOC, Other (Comment) ([facility] social workers have been working with her the past couple of months for this.)   Readmission Risk Interventions     No data to display

## 2022-11-13 NOTE — Plan of Care (Signed)

## 2022-11-13 NOTE — Evaluation (Signed)
Physical Therapy Evaluation Patient Details Name: Felicia Thompson MRN: [id number] DOB: 1946/05/28 Today's Date: 11/13/2022  History of Present Illness  76 year old female with end-stage renal disease on peritoneal dialysis, hypertension, sleep apnea, type 2 diabetes mellitus with hyperlipidemia, depression, anemia.  She initially came in with leg pain, nausea vomiting and diarrhea and lower abdominal discomfort weakness and confusion.  Patient was brought in for gastroenteritis and improved with IV fluid hydration.  Patient has not had any further diarrhea since coming into the hospital.  Nephrology following for peritoneal dialysis.  Clinical Impression  Pt is a pleasant 76 year old female who was admitted for sepsis. Pt performs bed mobility/transfers with independence and ambulation with supervision. Pt demonstrates deficits with balance/endurance. Would benefit from skilled PT to address above deficits and promote optimal return to PLOF. Currently recommending OP PT. Pt reports she doesn't feel she needs OP PT and may improve with time. Educated will keep on caseload for a few sessions, however does NOT need intensive or daily therapy at this time.      Recommendations for follow up therapy are one component of a multi-disciplinary discharge planning process, led by the attending physician.  Recommendations may be updated based on patient status, additional functional criteria and insurance authorization.  Follow Up Recommendations Outpatient PT      Assistance Recommended at Discharge PRN  Patient can return home with the following  A little help with walking and/or transfers;Help with stairs or ramp for entrance    Equipment Recommendations None recommended by PT  Recommendations for Other Services       Functional Status Assessment Patient has had a recent decline in their functional status and demonstrates the ability to make significant improvements in function in a  reasonable and predictable amount of time.     Precautions / Restrictions Precautions Precautions: Fall Restrictions Weight Bearing Restrictions: No      Mobility  Bed Mobility Overal bed mobility: Independent                  Transfers Overall transfer level: Independent                      Ambulation/Gait Ambulation/Gait assistance: Supervision Gait Distance (Feet): 220 Feet Assistive device: None Gait Pattern/deviations: Step-through pattern       General Gait Details: ambulated around RN station with slow speed and hesitantcy. No formal LOB noted.  Stairs            Wheelchair Mobility    Modified Rankin (Stroke Patients Only)       Balance Overall balance assessment: Modified Independent                                           Pertinent Vitals/Pain Pain Assessment Pain Assessment: No/denies pain    Home Living Family/patient expects to be discharged to:: Private residence Living Arrangements: Children (daughter) Available Help at Discharge: Available 24 hours/day (daughter is OT and SIL works from home) Type of Home: House Home Access: Stairs to enter Entrance Stairs-Rails: Can reach both Entrance Stairs-Number of Steps: 5 Alternate Level Stairs-Number of Steps: Gutierrez: Two level;Bed/bath upstairs Home Equipment: Rollator (4 wheels);Cane - single point;Tub bench;BSC/3in1 Additional Comments: elevated toilet riser    Prior Function Prior Level of Function : Independent/Modified Independent  Mobility Comments: recently no falls, indep with mobility. Was previously using SPC ADLs Comments: Independent with ADLs. Family assists with IADLs.     Hand Dominance        Extremity/Trunk Assessment   Upper Extremity Assessment Upper Extremity Assessment: Overall WFL for tasks assessed    Lower Extremity Assessment Lower Extremity Assessment: Generalized weakness (B LE grossly  4/5)       Communication   Communication: No difficulties  Cognition Arousal/Alertness: Awake/alert Behavior During Therapy: WFL for tasks assessed/performed Overall Cognitive Status: Within Functional Limits for tasks assessed                                          General Comments      Exercises Other Exercises Other Exercises: ambulated to bathroom. Able to use low toliet and perform hygiene safely.   Assessment/Plan    PT Assessment Patient needs continued PT services  PT Problem List Decreased activity tolerance;Decreased mobility       PT Treatment Interventions Gait training;Therapeutic exercise    PT Goals (Current goals can be found in the Care Plan section)  Acute Rehab PT Goals Patient Stated Goal: to go home PT Goal Formulation: With patient Time For Goal Achievement: 11/27/22 Potential to Achieve Goals: Good    Frequency Min 2X/week     Co-evaluation               AM-PAC PT "6 Clicks" Mobility  Outcome Measure Help needed turning from your back to your side while in a flat bed without using bedrails?: None Help needed moving from lying on your back to sitting on the side of a flat bed without using bedrails?: None Help needed moving to and from a bed to a chair (including a wheelchair)?: None Help needed standing up from a chair using your arms (e.g., wheelchair or bedside chair)?: None Help needed to walk in hospital room?: A Little Help needed climbing 3-5 steps with a railing? : A Little 6 Click Score: 22    End of Session   Activity Tolerance: Patient tolerated treatment well Patient left: in chair Nurse Communication: Mobility status PT Visit Diagnosis: Difficulty in walking, not elsewhere classified (R26.2)    Time: 8833-7445 PT Time Calculation (min) (ACUTE ONLY): 30 min   Charges:   PT Evaluation $PT Eval Low Complexity: 1 Low PT Treatments $Gait Training: 8-22 mins        Greggory Stallion, PT, DPT,  GCS [phone]   Felicia Thompson 11/13/2022, 10:14 AM

## 2022-11-13 NOTE — Discharge Planning (Addendum)
[place]  [address] [place], [place] [address] [phone]  Patient stated no dialysis needs at this time.  Update: Spoke with [place] NP about concerns that patient only has red bags at home and needs to order more. Called and spoke with [place] [employer] RN, who stated that patient and daughter were planning a trip to [place] tomorrow for a few weeks and supplies were shipped to that address. Stating that she would follow up with patient regarding supplies at home, but that the shipment contains the neccessary supplies. Also, [place]ce stated she would follow up with patient regarding transfer kit, if needed.

## 2022-11-14 ENCOUNTER — Ambulatory Visit (INDEPENDENT_AMBULATORY_CARE_PROVIDER_SITE_OTHER): Payer: Medicare Other | Admitting: Clinical

## 2022-11-14 ENCOUNTER — Telehealth: Payer: Self-pay

## 2022-11-14 DIAGNOSIS — D509 Iron deficiency anemia, unspecified: Secondary | ICD-10-CM | POA: Diagnosis not present

## 2022-11-14 DIAGNOSIS — R82998 Other abnormal findings in urine: Secondary | ICD-10-CM | POA: Diagnosis not present

## 2022-11-14 DIAGNOSIS — Z992 Dependence on renal dialysis: Secondary | ICD-10-CM | POA: Diagnosis not present

## 2022-11-14 DIAGNOSIS — D631 Anemia in chronic kidney disease: Secondary | ICD-10-CM | POA: Diagnosis not present

## 2022-11-14 DIAGNOSIS — F4321 Adjustment disorder with depressed mood: Secondary | ICD-10-CM | POA: Diagnosis not present

## 2022-11-14 DIAGNOSIS — N2589 Other disorders resulting from impaired renal tubular function: Secondary | ICD-10-CM | POA: Diagnosis not present

## 2022-11-14 DIAGNOSIS — N2581 Secondary hyperparathyroidism of renal origin: Secondary | ICD-10-CM | POA: Diagnosis not present

## 2022-11-14 DIAGNOSIS — N186 End stage renal disease: Secondary | ICD-10-CM | POA: Diagnosis not present

## 2022-11-14 DIAGNOSIS — E1129 Type 2 diabetes mellitus with other diabetic kidney complication: Secondary | ICD-10-CM | POA: Diagnosis not present

## 2022-11-14 DIAGNOSIS — K769 Liver disease, unspecified: Secondary | ICD-10-CM | POA: Diagnosis not present

## 2022-11-14 LAB — HEPATITIS B E ANTIBODY: Hep B E Ab: NEGATIVE

## 2022-11-14 NOTE — Patient Outreach (Signed)
  Care Coordination TOC Note Transition Care Management Follow-up Telephone Call Date of discharge and from where: 11/13/22-[facility]  Dx: "generalized weakness, acute encephalopathy" How have you been since you were released from the hospital? Patient states she is resting at present but has been doing well since returning home yesterday. She had a good night sleep. She has not ate anything yet. Denies any pain or other acute issues.  Any questions or concerns? No  Items Reviewed: Did the pt receive and understand the discharge instructions provided? Yes  Medications obtained and verified? Yes  Other? Yes  Any new allergies since your discharge? No  Dietary orders reviewed? Yes Do you have support at home? Yes   Home Care and Equipment/Supplies: Were home health services ordered? not applicable If so, what is the name of the agency? N/A  Has the agency set up a time to come to the patient's home? not applicable Were any new equipment or medical supplies ordered?  No What is the name of the medical supply agency? N/A Were you able to get the supplies/equipment? not applicable Do you have any questions related to the use of the equipment or supplies? No  Functional Questionnaire: (I = Independent and D = Dependent) ADLs: I  Bathing/Dressing- I  Meal Prep- I  Eating- I  Maintaining continence- I  Transferring/Ambulation- I  Managing Meds- I  Follow up appointments reviewed:  PCP Hospital f/u appt confirmed? No  . [facility] f/u appt confirmed?  N/A  . Are transportation arrangements needed?  Patient is unsure-will check with family first. Already active with [facility] SW(Kendra). Message sent to follow up with patient.  If their condition worsens, is the pt aware to call PCP or go to the Emergency Dept.? Yes Was the patient provided with contact information for the PCP's office or ED? Yes Was to pt encouraged to call back with questions or concerns? Yes  SDOH assessments  and interventions completed:   Yes SDOH Interventions Today    Flowsheet Row Most Recent Value  SDOH Interventions   Food Insecurity Interventions Intervention Not Indicated  Transportation Interventions Other (Comment)  [[facility] SW working on transportation with patient]       Care Coordination Interventions:  PCP follow up appointment requested   Notified assigned SW of possible needs for transportation assistance to appt  Encounter Outcome:  Pt. Visit Completed    Enzo Montgomery, RN,BSN,CCM [place] Management Telephonic Care Management Coordinator Direct Phone: [phone] Toll Free: [phone] Fax: [phone]
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
## 2022-11-14 NOTE — Progress Notes (Signed)
                Seema Teretha Chalupa, LCSW 

## 2022-11-15 ENCOUNTER — Telehealth: Payer: Self-pay | Admitting: *Deleted

## 2022-11-15 ENCOUNTER — Telehealth: Payer: Self-pay

## 2022-11-15 DIAGNOSIS — N186 End stage renal disease: Secondary | ICD-10-CM | POA: Diagnosis not present

## 2022-11-15 DIAGNOSIS — N2589 Other disorders resulting from impaired renal tubular function: Secondary | ICD-10-CM | POA: Diagnosis not present

## 2022-11-15 DIAGNOSIS — D509 Iron deficiency anemia, unspecified: Secondary | ICD-10-CM | POA: Diagnosis not present

## 2022-11-15 DIAGNOSIS — E1129 Type 2 diabetes mellitus with other diabetic kidney complication: Secondary | ICD-10-CM | POA: Diagnosis not present

## 2022-11-15 DIAGNOSIS — Z992 Dependence on renal dialysis: Secondary | ICD-10-CM | POA: Diagnosis not present

## 2022-11-15 DIAGNOSIS — R82998 Other abnormal findings in urine: Secondary | ICD-10-CM | POA: Diagnosis not present

## 2022-11-15 DIAGNOSIS — K769 Liver disease, unspecified: Secondary | ICD-10-CM | POA: Diagnosis not present

## 2022-11-15 DIAGNOSIS — N2581 Secondary hyperparathyroidism of renal origin: Secondary | ICD-10-CM | POA: Diagnosis not present

## 2022-11-15 DIAGNOSIS — D631 Anemia in chronic kidney disease: Secondary | ICD-10-CM | POA: Diagnosis not present

## 2022-11-15 NOTE — Progress Notes (Signed)
  Care Coordination  Note  11/15/2022 Name: Fabian Coca MRN: [id number] DOB: 01-20-1946  Akeela Busk is a 76 y.o. year old primary care patient of Isaac Bliss, Rayford Halsted, MD. I reached out to [name] by phone today to assist with scheduling a follow up appointment. Emme Rosenau Ranney verbally consented to my assistance.       Follow up plan: Hospital Follow Up appointment scheduled with (Dr Jerilee Hoh) on (11/21/2022) at (230pm).  Julian Hy, Melissa Direct Dial: [phone]

## 2022-11-15 NOTE — Patient Instructions (Signed)
Visit Information  Thank you for taking time to visit with me today. Please don't hesitate to contact me if I can be of assistance to you.   Following are the goals we discussed today:   Goals Addressed             This Visit's Progress    Care Coordination Activities       Care Coordination Interventions: Collaboration with Chancy Hurter with [employer] transit who advises patient has been approved for Paratransit services and may call [phone] to arrange transportation Collaboration with Enzo Montgomery, [place] who advises the patient has been discharged from the hospital and may need transportation assistance to follow up with her primary care provider  Unsuccessful outbound call placed to the patient, voice message left SW will attempt to contact the patient over the next 3 business days if no return call is received         If you are experiencing a Mental Health or [place] or need someone to talk to, please call 1-800-273-TALK (toll free, 24 hour hotline) call [place], BSW, CDP Social Worker, Certified Dementia Practitioner [place] [phone]

## 2022-11-15 NOTE — Patient Outreach (Signed)
  Care Coordination   Collaboration  Visit Note   11/15/2022 Name: Felicia Thompson MRN: [id number] DOB: [birth year]  Felicia Thompson is a 76 y.o. year old female who sees Isaac Bliss, Rayford Halsted, MD for primary care. I  attempted to engage with the patient following collaboration with colleague Enzo Montgomery RN Care Manager.  What matters to the patients health and wellness today?  Unsuccessful call to the patient    Goals Addressed             This Visit's Progress    Care Coordination Activities       Care Coordination Interventions: Collaboration with Chancy Hurter with [employer] transit who advises patient has been approved for Paratransit services and may call [phone] to arrange transportation Collaboration with Enzo Montgomery, [place] who advises the patient has been discharged from the hospital and may need transportation assistance to follow up with her primary care provider  Unsuccessful outbound call placed to the patient, voice message left SW will attempt to contact the patient over the next 3 business days if no return call is received          SDOH assessments and interventions completed:  No     Care Coordination Interventions:  Yes, provided   Follow up plan:  SW will attempt to outreach the patient over the next 3 business days    Encounter Outcome:  No Answer   [place], CDP Social Worker, Certified Dementia Practitioner [facility] Care Management  Care Coordination [phone]

## 2022-11-16 DIAGNOSIS — N186 End stage renal disease: Secondary | ICD-10-CM | POA: Diagnosis not present

## 2022-11-16 DIAGNOSIS — E1129 Type 2 diabetes mellitus with other diabetic kidney complication: Secondary | ICD-10-CM | POA: Diagnosis not present

## 2022-11-16 DIAGNOSIS — N2581 Secondary hyperparathyroidism of renal origin: Secondary | ICD-10-CM | POA: Diagnosis not present

## 2022-11-16 DIAGNOSIS — K769 Liver disease, unspecified: Secondary | ICD-10-CM | POA: Diagnosis not present

## 2022-11-16 DIAGNOSIS — R82998 Other abnormal findings in urine: Secondary | ICD-10-CM | POA: Diagnosis not present

## 2022-11-16 DIAGNOSIS — Z992 Dependence on renal dialysis: Secondary | ICD-10-CM | POA: Diagnosis not present

## 2022-11-16 DIAGNOSIS — N2589 Other disorders resulting from impaired renal tubular function: Secondary | ICD-10-CM | POA: Diagnosis not present

## 2022-11-16 DIAGNOSIS — D509 Iron deficiency anemia, unspecified: Secondary | ICD-10-CM | POA: Diagnosis not present

## 2022-11-16 DIAGNOSIS — D631 Anemia in chronic kidney disease: Secondary | ICD-10-CM | POA: Diagnosis not present

## 2022-11-16 LAB — CULTURE, BLOOD (SINGLE)
Culture: NO GROWTH
Culture: NO GROWTH

## 2022-11-17 DIAGNOSIS — N2589 Other disorders resulting from impaired renal tubular function: Secondary | ICD-10-CM | POA: Diagnosis not present

## 2022-11-17 DIAGNOSIS — R82998 Other abnormal findings in urine: Secondary | ICD-10-CM | POA: Diagnosis not present

## 2022-11-17 DIAGNOSIS — D509 Iron deficiency anemia, unspecified: Secondary | ICD-10-CM | POA: Diagnosis not present

## 2022-11-17 DIAGNOSIS — N186 End stage renal disease: Secondary | ICD-10-CM | POA: Diagnosis not present

## 2022-11-17 DIAGNOSIS — Z992 Dependence on renal dialysis: Secondary | ICD-10-CM | POA: Diagnosis not present

## 2022-11-17 DIAGNOSIS — D631 Anemia in chronic kidney disease: Secondary | ICD-10-CM | POA: Diagnosis not present

## 2022-11-17 DIAGNOSIS — K769 Liver disease, unspecified: Secondary | ICD-10-CM | POA: Diagnosis not present

## 2022-11-17 DIAGNOSIS — E1129 Type 2 diabetes mellitus with other diabetic kidney complication: Secondary | ICD-10-CM | POA: Diagnosis not present

## 2022-11-17 DIAGNOSIS — N2581 Secondary hyperparathyroidism of renal origin: Secondary | ICD-10-CM | POA: Diagnosis not present

## 2022-11-18 ENCOUNTER — Ambulatory Visit: Payer: Self-pay

## 2022-11-18 DIAGNOSIS — R82998 Other abnormal findings in urine: Secondary | ICD-10-CM | POA: Diagnosis not present

## 2022-11-18 DIAGNOSIS — N2589 Other disorders resulting from impaired renal tubular function: Secondary | ICD-10-CM | POA: Diagnosis not present

## 2022-11-18 DIAGNOSIS — D509 Iron deficiency anemia, unspecified: Secondary | ICD-10-CM | POA: Diagnosis not present

## 2022-11-18 DIAGNOSIS — N2581 Secondary hyperparathyroidism of renal origin: Secondary | ICD-10-CM | POA: Diagnosis not present

## 2022-11-18 DIAGNOSIS — K769 Liver disease, unspecified: Secondary | ICD-10-CM | POA: Diagnosis not present

## 2022-11-18 DIAGNOSIS — N186 End stage renal disease: Secondary | ICD-10-CM | POA: Diagnosis not present

## 2022-11-18 DIAGNOSIS — D631 Anemia in chronic kidney disease: Secondary | ICD-10-CM | POA: Diagnosis not present

## 2022-11-18 DIAGNOSIS — Z992 Dependence on renal dialysis: Secondary | ICD-10-CM | POA: Diagnosis not present

## 2022-11-18 DIAGNOSIS — E1129 Type 2 diabetes mellitus with other diabetic kidney complication: Secondary | ICD-10-CM | POA: Diagnosis not present

## 2022-11-18 NOTE — Patient Outreach (Signed)
  Care Coordination   Follow Up Visit Note   11/18/2022 Name: Tawyna Pellot MRN: [id number] DOB: [birth year]  Shizuye Rupert is a 76 y.o. year old female who sees Isaac Bliss, Rayford Halsted, MD for primary care. I spoke with  [name] by phone today.  What matters to the patients health and wellness today?  Transportation Resource Education    Goals Addressed             This Visit's Progress    COMPLETED: Care Coordination Activities       Care Coordination Interventions: Spoke with patient who does acknowledge upcomming PCP appointment and reports she does have transportation arranged Advised the patient she has been approved to use [employer] for transportation needs Education provided on how to access this service Encouraged the patient to contact her provider if she experiences barriers with this program          SDOH assessments and interventions completed:  No     Care Coordination Interventions:  Yes, provided   Follow up plan: No further intervention required.   Encounter Outcome:  Pt. Visit Completed   Daneen Schick, BSW, CDP Social Worker, Certified Dementia Practitioner [facility]  Care Coordination [phone]

## 2022-11-18 NOTE — Patient Instructions (Signed)
Visit Information  Thank you for taking time to visit with me today. Please don't hesitate to contact me if I can be of assistance to you.   Following are the goals we discussed today:   Goals Addressed             This Visit's Progress    COMPLETED: Care Coordination Activities       Care Coordination Interventions: Spoke with patient who does acknowledge upcomming PCP appointment and reports she does have transportation arranged Advised the patient she has been approved to use [employer] for transportation needs Education provided on how to access this service Encouraged the patient to contact her provider if she experiences barriers with this program          If you are experiencing a Mental Health or [place] or need someone to talk to, please call 1-800-273-TALK (toll free, 24 hour hotline) call 911  Patient verbalizes understanding of instructions and care plan provided today and agrees to view in [place]. Active MyChart status and patient understanding of how to access instructions and care plan via MyChart confirmed with patient.     No further follow up required: Please contact me as needed.  Daneen Schick, BSW, CDP Social Worker, Certified Dementia Practitioner [facility]  Care Coordination [phone]

## 2022-11-19 DIAGNOSIS — E1129 Type 2 diabetes mellitus with other diabetic kidney complication: Secondary | ICD-10-CM | POA: Diagnosis not present

## 2022-11-19 DIAGNOSIS — N186 End stage renal disease: Secondary | ICD-10-CM | POA: Diagnosis not present

## 2022-11-19 DIAGNOSIS — K769 Liver disease, unspecified: Secondary | ICD-10-CM | POA: Diagnosis not present

## 2022-11-19 DIAGNOSIS — N2589 Other disorders resulting from impaired renal tubular function: Secondary | ICD-10-CM | POA: Diagnosis not present

## 2022-11-19 DIAGNOSIS — D631 Anemia in chronic kidney disease: Secondary | ICD-10-CM | POA: Diagnosis not present

## 2022-11-19 DIAGNOSIS — N2581 Secondary hyperparathyroidism of renal origin: Secondary | ICD-10-CM | POA: Diagnosis not present

## 2022-11-19 DIAGNOSIS — Z992 Dependence on renal dialysis: Secondary | ICD-10-CM | POA: Diagnosis not present

## 2022-11-19 DIAGNOSIS — D509 Iron deficiency anemia, unspecified: Secondary | ICD-10-CM | POA: Diagnosis not present

## 2022-11-19 DIAGNOSIS — R82998 Other abnormal findings in urine: Secondary | ICD-10-CM | POA: Diagnosis not present

## 2022-11-20 DIAGNOSIS — K769 Liver disease, unspecified: Secondary | ICD-10-CM | POA: Diagnosis not present

## 2022-11-20 DIAGNOSIS — N186 End stage renal disease: Secondary | ICD-10-CM | POA: Diagnosis not present

## 2022-11-20 DIAGNOSIS — R82998 Other abnormal findings in urine: Secondary | ICD-10-CM | POA: Diagnosis not present

## 2022-11-20 DIAGNOSIS — Z992 Dependence on renal dialysis: Secondary | ICD-10-CM | POA: Diagnosis not present

## 2022-11-20 DIAGNOSIS — E1129 Type 2 diabetes mellitus with other diabetic kidney complication: Secondary | ICD-10-CM | POA: Diagnosis not present

## 2022-11-20 DIAGNOSIS — D631 Anemia in chronic kidney disease: Secondary | ICD-10-CM | POA: Diagnosis not present

## 2022-11-20 DIAGNOSIS — N2581 Secondary hyperparathyroidism of renal origin: Secondary | ICD-10-CM | POA: Diagnosis not present

## 2022-11-20 DIAGNOSIS — D509 Iron deficiency anemia, unspecified: Secondary | ICD-10-CM | POA: Diagnosis not present

## 2022-11-20 DIAGNOSIS — N2589 Other disorders resulting from impaired renal tubular function: Secondary | ICD-10-CM | POA: Diagnosis not present

## 2022-11-21 ENCOUNTER — Inpatient Hospital Stay: Payer: Medicare Other | Admitting: Internal Medicine

## 2022-11-21 DIAGNOSIS — D631 Anemia in chronic kidney disease: Secondary | ICD-10-CM | POA: Diagnosis not present

## 2022-11-21 DIAGNOSIS — D509 Iron deficiency anemia, unspecified: Secondary | ICD-10-CM | POA: Diagnosis not present

## 2022-11-21 DIAGNOSIS — E1129 Type 2 diabetes mellitus with other diabetic kidney complication: Secondary | ICD-10-CM | POA: Diagnosis not present

## 2022-11-21 DIAGNOSIS — G4733 Obstructive sleep apnea (adult) (pediatric): Secondary | ICD-10-CM | POA: Diagnosis not present

## 2022-11-21 DIAGNOSIS — N186 End stage renal disease: Secondary | ICD-10-CM | POA: Diagnosis not present

## 2022-11-21 DIAGNOSIS — K769 Liver disease, unspecified: Secondary | ICD-10-CM | POA: Diagnosis not present

## 2022-11-21 DIAGNOSIS — Z992 Dependence on renal dialysis: Secondary | ICD-10-CM | POA: Diagnosis not present

## 2022-11-21 DIAGNOSIS — N2581 Secondary hyperparathyroidism of renal origin: Secondary | ICD-10-CM | POA: Diagnosis not present

## 2022-11-21 DIAGNOSIS — N2589 Other disorders resulting from impaired renal tubular function: Secondary | ICD-10-CM | POA: Diagnosis not present

## 2022-11-21 DIAGNOSIS — R82998 Other abnormal findings in urine: Secondary | ICD-10-CM | POA: Diagnosis not present

## 2022-11-22 DIAGNOSIS — N2581 Secondary hyperparathyroidism of renal origin: Secondary | ICD-10-CM | POA: Diagnosis not present

## 2022-11-22 DIAGNOSIS — Z992 Dependence on renal dialysis: Secondary | ICD-10-CM | POA: Diagnosis not present

## 2022-11-22 DIAGNOSIS — K769 Liver disease, unspecified: Secondary | ICD-10-CM | POA: Diagnosis not present

## 2022-11-22 DIAGNOSIS — E1129 Type 2 diabetes mellitus with other diabetic kidney complication: Secondary | ICD-10-CM | POA: Diagnosis not present

## 2022-11-22 DIAGNOSIS — N186 End stage renal disease: Secondary | ICD-10-CM | POA: Diagnosis not present

## 2022-11-22 DIAGNOSIS — D631 Anemia in chronic kidney disease: Secondary | ICD-10-CM | POA: Diagnosis not present

## 2022-11-22 DIAGNOSIS — R82998 Other abnormal findings in urine: Secondary | ICD-10-CM | POA: Diagnosis not present

## 2022-11-22 DIAGNOSIS — N2589 Other disorders resulting from impaired renal tubular function: Secondary | ICD-10-CM | POA: Diagnosis not present

## 2022-11-22 DIAGNOSIS — D509 Iron deficiency anemia, unspecified: Secondary | ICD-10-CM | POA: Diagnosis not present

## 2022-11-23 DIAGNOSIS — N186 End stage renal disease: Secondary | ICD-10-CM | POA: Diagnosis not present

## 2022-11-23 DIAGNOSIS — N2589 Other disorders resulting from impaired renal tubular function: Secondary | ICD-10-CM | POA: Diagnosis not present

## 2022-11-23 DIAGNOSIS — K769 Liver disease, unspecified: Secondary | ICD-10-CM | POA: Diagnosis not present

## 2022-11-23 DIAGNOSIS — Z992 Dependence on renal dialysis: Secondary | ICD-10-CM | POA: Diagnosis not present

## 2022-11-23 DIAGNOSIS — D509 Iron deficiency anemia, unspecified: Secondary | ICD-10-CM | POA: Diagnosis not present

## 2022-11-23 DIAGNOSIS — R82998 Other abnormal findings in urine: Secondary | ICD-10-CM | POA: Diagnosis not present

## 2022-11-23 DIAGNOSIS — N2581 Secondary hyperparathyroidism of renal origin: Secondary | ICD-10-CM | POA: Diagnosis not present

## 2022-11-23 DIAGNOSIS — E1129 Type 2 diabetes mellitus with other diabetic kidney complication: Secondary | ICD-10-CM | POA: Diagnosis not present

## 2022-11-23 DIAGNOSIS — D631 Anemia in chronic kidney disease: Secondary | ICD-10-CM | POA: Diagnosis not present

## 2022-11-24 DIAGNOSIS — N2589 Other disorders resulting from impaired renal tubular function: Secondary | ICD-10-CM | POA: Diagnosis not present

## 2022-11-24 DIAGNOSIS — D631 Anemia in chronic kidney disease: Secondary | ICD-10-CM | POA: Diagnosis not present

## 2022-11-24 DIAGNOSIS — D509 Iron deficiency anemia, unspecified: Secondary | ICD-10-CM | POA: Diagnosis not present

## 2022-11-24 DIAGNOSIS — E1129 Type 2 diabetes mellitus with other diabetic kidney complication: Secondary | ICD-10-CM | POA: Diagnosis not present

## 2022-11-24 DIAGNOSIS — R82998 Other abnormal findings in urine: Secondary | ICD-10-CM | POA: Diagnosis not present

## 2022-11-24 DIAGNOSIS — K769 Liver disease, unspecified: Secondary | ICD-10-CM | POA: Diagnosis not present

## 2022-11-24 DIAGNOSIS — N2581 Secondary hyperparathyroidism of renal origin: Secondary | ICD-10-CM | POA: Diagnosis not present

## 2022-11-24 DIAGNOSIS — Z992 Dependence on renal dialysis: Secondary | ICD-10-CM | POA: Diagnosis not present

## 2022-11-24 DIAGNOSIS — N186 End stage renal disease: Secondary | ICD-10-CM | POA: Diagnosis not present

## 2022-11-25 DIAGNOSIS — D631 Anemia in chronic kidney disease: Secondary | ICD-10-CM | POA: Diagnosis not present

## 2022-11-25 DIAGNOSIS — R82998 Other abnormal findings in urine: Secondary | ICD-10-CM | POA: Diagnosis not present

## 2022-11-25 DIAGNOSIS — K769 Liver disease, unspecified: Secondary | ICD-10-CM | POA: Diagnosis not present

## 2022-11-25 DIAGNOSIS — N186 End stage renal disease: Secondary | ICD-10-CM | POA: Diagnosis not present

## 2022-11-25 DIAGNOSIS — N2589 Other disorders resulting from impaired renal tubular function: Secondary | ICD-10-CM | POA: Diagnosis not present

## 2022-11-25 DIAGNOSIS — N2581 Secondary hyperparathyroidism of renal origin: Secondary | ICD-10-CM | POA: Diagnosis not present

## 2022-11-25 DIAGNOSIS — Z992 Dependence on renal dialysis: Secondary | ICD-10-CM | POA: Diagnosis not present

## 2022-11-25 DIAGNOSIS — D509 Iron deficiency anemia, unspecified: Secondary | ICD-10-CM | POA: Diagnosis not present

## 2022-11-25 DIAGNOSIS — E1129 Type 2 diabetes mellitus with other diabetic kidney complication: Secondary | ICD-10-CM | POA: Diagnosis not present

## 2022-11-26 DIAGNOSIS — N186 End stage renal disease: Secondary | ICD-10-CM | POA: Diagnosis not present

## 2022-11-26 DIAGNOSIS — R82998 Other abnormal findings in urine: Secondary | ICD-10-CM | POA: Diagnosis not present

## 2022-11-26 DIAGNOSIS — Z992 Dependence on renal dialysis: Secondary | ICD-10-CM | POA: Diagnosis not present

## 2022-11-26 DIAGNOSIS — D631 Anemia in chronic kidney disease: Secondary | ICD-10-CM | POA: Diagnosis not present

## 2022-11-26 DIAGNOSIS — K769 Liver disease, unspecified: Secondary | ICD-10-CM | POA: Diagnosis not present

## 2022-11-26 DIAGNOSIS — D509 Iron deficiency anemia, unspecified: Secondary | ICD-10-CM | POA: Diagnosis not present

## 2022-11-26 DIAGNOSIS — N2589 Other disorders resulting from impaired renal tubular function: Secondary | ICD-10-CM | POA: Diagnosis not present

## 2022-11-26 DIAGNOSIS — E1129 Type 2 diabetes mellitus with other diabetic kidney complication: Secondary | ICD-10-CM | POA: Diagnosis not present

## 2022-11-26 DIAGNOSIS — N2581 Secondary hyperparathyroidism of renal origin: Secondary | ICD-10-CM | POA: Diagnosis not present

## 2022-11-27 DIAGNOSIS — Z992 Dependence on renal dialysis: Secondary | ICD-10-CM | POA: Diagnosis not present

## 2022-11-27 DIAGNOSIS — N2581 Secondary hyperparathyroidism of renal origin: Secondary | ICD-10-CM | POA: Diagnosis not present

## 2022-11-27 DIAGNOSIS — R82998 Other abnormal findings in urine: Secondary | ICD-10-CM | POA: Diagnosis not present

## 2022-11-27 DIAGNOSIS — D631 Anemia in chronic kidney disease: Secondary | ICD-10-CM | POA: Diagnosis not present

## 2022-11-27 DIAGNOSIS — K769 Liver disease, unspecified: Secondary | ICD-10-CM | POA: Diagnosis not present

## 2022-11-27 DIAGNOSIS — N2589 Other disorders resulting from impaired renal tubular function: Secondary | ICD-10-CM | POA: Diagnosis not present

## 2022-11-27 DIAGNOSIS — N186 End stage renal disease: Secondary | ICD-10-CM | POA: Diagnosis not present

## 2022-11-27 DIAGNOSIS — E1129 Type 2 diabetes mellitus with other diabetic kidney complication: Secondary | ICD-10-CM | POA: Diagnosis not present

## 2022-11-27 DIAGNOSIS — D509 Iron deficiency anemia, unspecified: Secondary | ICD-10-CM | POA: Diagnosis not present

## 2022-11-28 DIAGNOSIS — Z992 Dependence on renal dialysis: Secondary | ICD-10-CM | POA: Diagnosis not present

## 2022-11-28 DIAGNOSIS — N186 End stage renal disease: Secondary | ICD-10-CM | POA: Diagnosis not present

## 2022-11-28 DIAGNOSIS — D631 Anemia in chronic kidney disease: Secondary | ICD-10-CM | POA: Diagnosis not present

## 2022-11-28 DIAGNOSIS — N2581 Secondary hyperparathyroidism of renal origin: Secondary | ICD-10-CM | POA: Diagnosis not present

## 2022-11-28 DIAGNOSIS — D509 Iron deficiency anemia, unspecified: Secondary | ICD-10-CM | POA: Diagnosis not present

## 2022-11-28 DIAGNOSIS — R82998 Other abnormal findings in urine: Secondary | ICD-10-CM | POA: Diagnosis not present

## 2022-11-28 DIAGNOSIS — N2589 Other disorders resulting from impaired renal tubular function: Secondary | ICD-10-CM | POA: Diagnosis not present

## 2022-11-28 DIAGNOSIS — E1129 Type 2 diabetes mellitus with other diabetic kidney complication: Secondary | ICD-10-CM | POA: Diagnosis not present

## 2022-11-28 DIAGNOSIS — K769 Liver disease, unspecified: Secondary | ICD-10-CM | POA: Diagnosis not present

## 2022-11-29 DIAGNOSIS — E1129 Type 2 diabetes mellitus with other diabetic kidney complication: Secondary | ICD-10-CM | POA: Diagnosis not present

## 2022-11-29 DIAGNOSIS — N186 End stage renal disease: Secondary | ICD-10-CM | POA: Diagnosis not present

## 2022-11-29 DIAGNOSIS — R82998 Other abnormal findings in urine: Secondary | ICD-10-CM | POA: Diagnosis not present

## 2022-11-29 DIAGNOSIS — N2589 Other disorders resulting from impaired renal tubular function: Secondary | ICD-10-CM | POA: Diagnosis not present

## 2022-11-29 DIAGNOSIS — Z992 Dependence on renal dialysis: Secondary | ICD-10-CM | POA: Diagnosis not present

## 2022-11-29 DIAGNOSIS — N2581 Secondary hyperparathyroidism of renal origin: Secondary | ICD-10-CM | POA: Diagnosis not present

## 2022-11-29 DIAGNOSIS — K769 Liver disease, unspecified: Secondary | ICD-10-CM | POA: Diagnosis not present

## 2022-11-29 DIAGNOSIS — D631 Anemia in chronic kidney disease: Secondary | ICD-10-CM | POA: Diagnosis not present

## 2022-11-29 DIAGNOSIS — D509 Iron deficiency anemia, unspecified: Secondary | ICD-10-CM | POA: Diagnosis not present

## 2022-12-01 DIAGNOSIS — N186 End stage renal disease: Secondary | ICD-10-CM | POA: Diagnosis not present

## 2022-12-01 DIAGNOSIS — Z992 Dependence on renal dialysis: Secondary | ICD-10-CM | POA: Diagnosis not present

## 2022-12-04 DIAGNOSIS — N2581 Secondary hyperparathyroidism of renal origin: Secondary | ICD-10-CM | POA: Diagnosis not present

## 2022-12-04 DIAGNOSIS — Z992 Dependence on renal dialysis: Secondary | ICD-10-CM | POA: Diagnosis not present

## 2022-12-04 DIAGNOSIS — N186 End stage renal disease: Secondary | ICD-10-CM | POA: Diagnosis not present

## 2022-12-05 DIAGNOSIS — Z992 Dependence on renal dialysis: Secondary | ICD-10-CM | POA: Diagnosis not present

## 2022-12-05 DIAGNOSIS — N2581 Secondary hyperparathyroidism of renal origin: Secondary | ICD-10-CM | POA: Diagnosis not present

## 2022-12-05 DIAGNOSIS — N186 End stage renal disease: Secondary | ICD-10-CM | POA: Diagnosis not present

## 2022-12-06 DIAGNOSIS — Z992 Dependence on renal dialysis: Secondary | ICD-10-CM | POA: Diagnosis not present

## 2022-12-06 DIAGNOSIS — N2581 Secondary hyperparathyroidism of renal origin: Secondary | ICD-10-CM | POA: Diagnosis not present

## 2022-12-06 DIAGNOSIS — N186 End stage renal disease: Secondary | ICD-10-CM | POA: Diagnosis not present

## 2022-12-07 DIAGNOSIS — N2581 Secondary hyperparathyroidism of renal origin: Secondary | ICD-10-CM | POA: Diagnosis not present

## 2022-12-07 DIAGNOSIS — N186 End stage renal disease: Secondary | ICD-10-CM | POA: Diagnosis not present

## 2022-12-07 DIAGNOSIS — Z992 Dependence on renal dialysis: Secondary | ICD-10-CM | POA: Diagnosis not present

## 2022-12-08 DIAGNOSIS — Z992 Dependence on renal dialysis: Secondary | ICD-10-CM | POA: Diagnosis not present

## 2022-12-08 DIAGNOSIS — N186 End stage renal disease: Secondary | ICD-10-CM | POA: Diagnosis not present

## 2022-12-08 DIAGNOSIS — N2581 Secondary hyperparathyroidism of renal origin: Secondary | ICD-10-CM | POA: Diagnosis not present

## 2022-12-09 DIAGNOSIS — N2581 Secondary hyperparathyroidism of renal origin: Secondary | ICD-10-CM | POA: Diagnosis not present

## 2022-12-09 DIAGNOSIS — Z992 Dependence on renal dialysis: Secondary | ICD-10-CM | POA: Diagnosis not present

## 2022-12-09 DIAGNOSIS — N186 End stage renal disease: Secondary | ICD-10-CM | POA: Diagnosis not present

## 2022-12-10 DIAGNOSIS — Z992 Dependence on renal dialysis: Secondary | ICD-10-CM | POA: Diagnosis not present

## 2022-12-10 DIAGNOSIS — N186 End stage renal disease: Secondary | ICD-10-CM | POA: Diagnosis not present

## 2022-12-10 DIAGNOSIS — N2581 Secondary hyperparathyroidism of renal origin: Secondary | ICD-10-CM | POA: Diagnosis not present

## 2022-12-11 DIAGNOSIS — Z992 Dependence on renal dialysis: Secondary | ICD-10-CM | POA: Diagnosis not present

## 2022-12-11 DIAGNOSIS — N2581 Secondary hyperparathyroidism of renal origin: Secondary | ICD-10-CM | POA: Diagnosis not present

## 2022-12-11 DIAGNOSIS — N186 End stage renal disease: Secondary | ICD-10-CM | POA: Diagnosis not present

## 2022-12-12 DIAGNOSIS — N2581 Secondary hyperparathyroidism of renal origin: Secondary | ICD-10-CM | POA: Diagnosis not present

## 2022-12-12 DIAGNOSIS — Z992 Dependence on renal dialysis: Secondary | ICD-10-CM | POA: Diagnosis not present

## 2022-12-12 DIAGNOSIS — N186 End stage renal disease: Secondary | ICD-10-CM | POA: Diagnosis not present

## 2022-12-13 ENCOUNTER — Ambulatory Visit: Payer: Medicare Other | Admitting: Clinical

## 2022-12-13 DIAGNOSIS — Z992 Dependence on renal dialysis: Secondary | ICD-10-CM | POA: Diagnosis not present

## 2022-12-13 DIAGNOSIS — N186 End stage renal disease: Secondary | ICD-10-CM | POA: Diagnosis not present

## 2022-12-13 DIAGNOSIS — N2581 Secondary hyperparathyroidism of renal origin: Secondary | ICD-10-CM | POA: Diagnosis not present

## 2022-12-14 DIAGNOSIS — N186 End stage renal disease: Secondary | ICD-10-CM | POA: Diagnosis not present

## 2022-12-14 DIAGNOSIS — Z992 Dependence on renal dialysis: Secondary | ICD-10-CM | POA: Diagnosis not present

## 2022-12-14 DIAGNOSIS — N2581 Secondary hyperparathyroidism of renal origin: Secondary | ICD-10-CM | POA: Diagnosis not present

## 2022-12-15 DIAGNOSIS — N2581 Secondary hyperparathyroidism of renal origin: Secondary | ICD-10-CM | POA: Diagnosis not present

## 2022-12-15 DIAGNOSIS — N186 End stage renal disease: Secondary | ICD-10-CM | POA: Diagnosis not present

## 2022-12-15 DIAGNOSIS — Z992 Dependence on renal dialysis: Secondary | ICD-10-CM | POA: Diagnosis not present

## 2022-12-16 DIAGNOSIS — N186 End stage renal disease: Secondary | ICD-10-CM | POA: Diagnosis not present

## 2022-12-16 DIAGNOSIS — N2581 Secondary hyperparathyroidism of renal origin: Secondary | ICD-10-CM | POA: Diagnosis not present

## 2022-12-16 DIAGNOSIS — Z992 Dependence on renal dialysis: Secondary | ICD-10-CM | POA: Diagnosis not present

## 2022-12-17 DIAGNOSIS — N186 End stage renal disease: Secondary | ICD-10-CM | POA: Diagnosis not present

## 2022-12-17 DIAGNOSIS — N2581 Secondary hyperparathyroidism of renal origin: Secondary | ICD-10-CM | POA: Diagnosis not present

## 2022-12-17 DIAGNOSIS — Z992 Dependence on renal dialysis: Secondary | ICD-10-CM | POA: Diagnosis not present

## 2022-12-18 DIAGNOSIS — N2581 Secondary hyperparathyroidism of renal origin: Secondary | ICD-10-CM | POA: Diagnosis not present

## 2022-12-18 DIAGNOSIS — N186 End stage renal disease: Secondary | ICD-10-CM | POA: Diagnosis not present

## 2022-12-18 DIAGNOSIS — Z992 Dependence on renal dialysis: Secondary | ICD-10-CM | POA: Diagnosis not present

## 2022-12-19 DIAGNOSIS — N2581 Secondary hyperparathyroidism of renal origin: Secondary | ICD-10-CM | POA: Diagnosis not present

## 2022-12-19 DIAGNOSIS — Z992 Dependence on renal dialysis: Secondary | ICD-10-CM | POA: Diagnosis not present

## 2022-12-19 DIAGNOSIS — N186 End stage renal disease: Secondary | ICD-10-CM | POA: Diagnosis not present

## 2022-12-20 DIAGNOSIS — N2581 Secondary hyperparathyroidism of renal origin: Secondary | ICD-10-CM | POA: Diagnosis not present

## 2022-12-20 DIAGNOSIS — Z992 Dependence on renal dialysis: Secondary | ICD-10-CM | POA: Diagnosis not present

## 2022-12-20 DIAGNOSIS — N186 End stage renal disease: Secondary | ICD-10-CM | POA: Diagnosis not present

## 2022-12-21 DIAGNOSIS — Z992 Dependence on renal dialysis: Secondary | ICD-10-CM | POA: Diagnosis not present

## 2022-12-21 DIAGNOSIS — N186 End stage renal disease: Secondary | ICD-10-CM | POA: Diagnosis not present

## 2022-12-21 DIAGNOSIS — N2581 Secondary hyperparathyroidism of renal origin: Secondary | ICD-10-CM | POA: Diagnosis not present

## 2022-12-22 DIAGNOSIS — N2581 Secondary hyperparathyroidism of renal origin: Secondary | ICD-10-CM | POA: Diagnosis not present

## 2022-12-22 DIAGNOSIS — Z992 Dependence on renal dialysis: Secondary | ICD-10-CM | POA: Diagnosis not present

## 2022-12-22 DIAGNOSIS — N186 End stage renal disease: Secondary | ICD-10-CM | POA: Diagnosis not present

## 2022-12-22 DIAGNOSIS — G4733 Obstructive sleep apnea (adult) (pediatric): Secondary | ICD-10-CM | POA: Diagnosis not present

## 2022-12-23 DIAGNOSIS — N186 End stage renal disease: Secondary | ICD-10-CM | POA: Diagnosis not present

## 2022-12-23 DIAGNOSIS — N2581 Secondary hyperparathyroidism of renal origin: Secondary | ICD-10-CM | POA: Diagnosis not present

## 2022-12-23 DIAGNOSIS — Z992 Dependence on renal dialysis: Secondary | ICD-10-CM | POA: Diagnosis not present

## 2022-12-24 DIAGNOSIS — N186 End stage renal disease: Secondary | ICD-10-CM | POA: Diagnosis not present

## 2022-12-24 DIAGNOSIS — N2581 Secondary hyperparathyroidism of renal origin: Secondary | ICD-10-CM | POA: Diagnosis not present

## 2022-12-24 DIAGNOSIS — Z992 Dependence on renal dialysis: Secondary | ICD-10-CM | POA: Diagnosis not present

## 2022-12-25 DIAGNOSIS — N186 End stage renal disease: Secondary | ICD-10-CM | POA: Diagnosis not present

## 2022-12-25 DIAGNOSIS — Z992 Dependence on renal dialysis: Secondary | ICD-10-CM | POA: Diagnosis not present

## 2022-12-25 DIAGNOSIS — N2581 Secondary hyperparathyroidism of renal origin: Secondary | ICD-10-CM | POA: Diagnosis not present

## 2022-12-26 DIAGNOSIS — N186 End stage renal disease: Secondary | ICD-10-CM | POA: Diagnosis not present

## 2022-12-26 DIAGNOSIS — N2581 Secondary hyperparathyroidism of renal origin: Secondary | ICD-10-CM | POA: Diagnosis not present

## 2022-12-26 DIAGNOSIS — Z992 Dependence on renal dialysis: Secondary | ICD-10-CM | POA: Diagnosis not present

## 2022-12-27 DIAGNOSIS — Z992 Dependence on renal dialysis: Secondary | ICD-10-CM | POA: Diagnosis not present

## 2022-12-27 DIAGNOSIS — N2581 Secondary hyperparathyroidism of renal origin: Secondary | ICD-10-CM | POA: Diagnosis not present

## 2022-12-27 DIAGNOSIS — N186 End stage renal disease: Secondary | ICD-10-CM | POA: Diagnosis not present

## 2022-12-28 DIAGNOSIS — N186 End stage renal disease: Secondary | ICD-10-CM | POA: Diagnosis not present

## 2022-12-28 DIAGNOSIS — Z992 Dependence on renal dialysis: Secondary | ICD-10-CM | POA: Diagnosis not present

## 2022-12-28 DIAGNOSIS — N2581 Secondary hyperparathyroidism of renal origin: Secondary | ICD-10-CM | POA: Diagnosis not present

## 2022-12-29 DIAGNOSIS — N2581 Secondary hyperparathyroidism of renal origin: Secondary | ICD-10-CM | POA: Diagnosis not present

## 2022-12-29 DIAGNOSIS — N186 End stage renal disease: Secondary | ICD-10-CM | POA: Diagnosis not present

## 2022-12-29 DIAGNOSIS — Z992 Dependence on renal dialysis: Secondary | ICD-10-CM | POA: Diagnosis not present

## 2022-12-30 DIAGNOSIS — Z992 Dependence on renal dialysis: Secondary | ICD-10-CM | POA: Diagnosis not present

## 2022-12-30 DIAGNOSIS — N2581 Secondary hyperparathyroidism of renal origin: Secondary | ICD-10-CM | POA: Diagnosis not present

## 2022-12-30 DIAGNOSIS — N186 End stage renal disease: Secondary | ICD-10-CM | POA: Diagnosis not present

## 2022-12-31 DIAGNOSIS — N186 End stage renal disease: Secondary | ICD-10-CM | POA: Diagnosis not present

## 2022-12-31 DIAGNOSIS — N2581 Secondary hyperparathyroidism of renal origin: Secondary | ICD-10-CM | POA: Diagnosis not present

## 2022-12-31 DIAGNOSIS — Z992 Dependence on renal dialysis: Secondary | ICD-10-CM | POA: Diagnosis not present

## 2023-01-01 DIAGNOSIS — N186 End stage renal disease: Secondary | ICD-10-CM | POA: Diagnosis not present

## 2023-01-01 DIAGNOSIS — Z992 Dependence on renal dialysis: Secondary | ICD-10-CM | POA: Diagnosis not present

## 2023-01-01 DIAGNOSIS — N2581 Secondary hyperparathyroidism of renal origin: Secondary | ICD-10-CM | POA: Diagnosis not present

## 2023-01-02 DIAGNOSIS — N2589 Other disorders resulting from impaired renal tubular function: Secondary | ICD-10-CM | POA: Diagnosis not present

## 2023-01-02 DIAGNOSIS — N186 End stage renal disease: Secondary | ICD-10-CM | POA: Diagnosis not present

## 2023-01-02 DIAGNOSIS — K769 Liver disease, unspecified: Secondary | ICD-10-CM | POA: Diagnosis not present

## 2023-01-02 DIAGNOSIS — Z992 Dependence on renal dialysis: Secondary | ICD-10-CM | POA: Diagnosis not present

## 2023-01-02 DIAGNOSIS — E1129 Type 2 diabetes mellitus with other diabetic kidney complication: Secondary | ICD-10-CM | POA: Diagnosis not present

## 2023-01-02 DIAGNOSIS — D631 Anemia in chronic kidney disease: Secondary | ICD-10-CM | POA: Diagnosis not present

## 2023-01-02 DIAGNOSIS — R82998 Other abnormal findings in urine: Secondary | ICD-10-CM | POA: Diagnosis not present

## 2023-01-02 DIAGNOSIS — D509 Iron deficiency anemia, unspecified: Secondary | ICD-10-CM | POA: Diagnosis not present

## 2023-01-02 DIAGNOSIS — N2581 Secondary hyperparathyroidism of renal origin: Secondary | ICD-10-CM | POA: Diagnosis not present

## 2023-01-03 DIAGNOSIS — R82998 Other abnormal findings in urine: Secondary | ICD-10-CM | POA: Diagnosis not present

## 2023-01-03 DIAGNOSIS — Z992 Dependence on renal dialysis: Secondary | ICD-10-CM | POA: Diagnosis not present

## 2023-01-03 DIAGNOSIS — N2581 Secondary hyperparathyroidism of renal origin: Secondary | ICD-10-CM | POA: Diagnosis not present

## 2023-01-03 DIAGNOSIS — D631 Anemia in chronic kidney disease: Secondary | ICD-10-CM | POA: Diagnosis not present

## 2023-01-03 DIAGNOSIS — K769 Liver disease, unspecified: Secondary | ICD-10-CM | POA: Diagnosis not present

## 2023-01-03 DIAGNOSIS — E1129 Type 2 diabetes mellitus with other diabetic kidney complication: Secondary | ICD-10-CM | POA: Diagnosis not present

## 2023-01-03 DIAGNOSIS — N186 End stage renal disease: Secondary | ICD-10-CM | POA: Diagnosis not present

## 2023-01-03 DIAGNOSIS — N2589 Other disorders resulting from impaired renal tubular function: Secondary | ICD-10-CM | POA: Diagnosis not present

## 2023-01-03 DIAGNOSIS — D509 Iron deficiency anemia, unspecified: Secondary | ICD-10-CM | POA: Diagnosis not present

## 2023-01-04 DIAGNOSIS — Z992 Dependence on renal dialysis: Secondary | ICD-10-CM | POA: Diagnosis not present

## 2023-01-04 DIAGNOSIS — N2589 Other disorders resulting from impaired renal tubular function: Secondary | ICD-10-CM | POA: Diagnosis not present

## 2023-01-04 DIAGNOSIS — D509 Iron deficiency anemia, unspecified: Secondary | ICD-10-CM | POA: Diagnosis not present

## 2023-01-04 DIAGNOSIS — R82998 Other abnormal findings in urine: Secondary | ICD-10-CM | POA: Diagnosis not present

## 2023-01-04 DIAGNOSIS — N186 End stage renal disease: Secondary | ICD-10-CM | POA: Diagnosis not present

## 2023-01-04 DIAGNOSIS — D631 Anemia in chronic kidney disease: Secondary | ICD-10-CM | POA: Diagnosis not present

## 2023-01-04 DIAGNOSIS — E1129 Type 2 diabetes mellitus with other diabetic kidney complication: Secondary | ICD-10-CM | POA: Diagnosis not present

## 2023-01-04 DIAGNOSIS — K769 Liver disease, unspecified: Secondary | ICD-10-CM | POA: Diagnosis not present

## 2023-01-04 DIAGNOSIS — N2581 Secondary hyperparathyroidism of renal origin: Secondary | ICD-10-CM | POA: Diagnosis not present

## 2023-01-05 DIAGNOSIS — Z992 Dependence on renal dialysis: Secondary | ICD-10-CM | POA: Diagnosis not present

## 2023-01-05 DIAGNOSIS — D631 Anemia in chronic kidney disease: Secondary | ICD-10-CM | POA: Diagnosis not present

## 2023-01-05 DIAGNOSIS — D509 Iron deficiency anemia, unspecified: Secondary | ICD-10-CM | POA: Diagnosis not present

## 2023-01-05 DIAGNOSIS — R82998 Other abnormal findings in urine: Secondary | ICD-10-CM | POA: Diagnosis not present

## 2023-01-05 DIAGNOSIS — N2589 Other disorders resulting from impaired renal tubular function: Secondary | ICD-10-CM | POA: Diagnosis not present

## 2023-01-05 DIAGNOSIS — N2581 Secondary hyperparathyroidism of renal origin: Secondary | ICD-10-CM | POA: Diagnosis not present

## 2023-01-05 DIAGNOSIS — K769 Liver disease, unspecified: Secondary | ICD-10-CM | POA: Diagnosis not present

## 2023-01-05 DIAGNOSIS — N186 End stage renal disease: Secondary | ICD-10-CM | POA: Diagnosis not present

## 2023-01-05 DIAGNOSIS — E1129 Type 2 diabetes mellitus with other diabetic kidney complication: Secondary | ICD-10-CM | POA: Diagnosis not present

## 2023-01-06 ENCOUNTER — Ambulatory Visit (INDEPENDENT_AMBULATORY_CARE_PROVIDER_SITE_OTHER): Payer: Medicare Other | Admitting: Clinical

## 2023-01-06 DIAGNOSIS — E1129 Type 2 diabetes mellitus with other diabetic kidney complication: Secondary | ICD-10-CM | POA: Diagnosis not present

## 2023-01-06 DIAGNOSIS — F4321 Adjustment disorder with depressed mood: Secondary | ICD-10-CM

## 2023-01-06 DIAGNOSIS — R82998 Other abnormal findings in urine: Secondary | ICD-10-CM | POA: Diagnosis not present

## 2023-01-06 DIAGNOSIS — K769 Liver disease, unspecified: Secondary | ICD-10-CM | POA: Diagnosis not present

## 2023-01-06 DIAGNOSIS — N2581 Secondary hyperparathyroidism of renal origin: Secondary | ICD-10-CM | POA: Diagnosis not present

## 2023-01-06 DIAGNOSIS — N2589 Other disorders resulting from impaired renal tubular function: Secondary | ICD-10-CM | POA: Diagnosis not present

## 2023-01-06 DIAGNOSIS — N186 End stage renal disease: Secondary | ICD-10-CM | POA: Diagnosis not present

## 2023-01-06 DIAGNOSIS — Z992 Dependence on renal dialysis: Secondary | ICD-10-CM | POA: Diagnosis not present

## 2023-01-06 DIAGNOSIS — D631 Anemia in chronic kidney disease: Secondary | ICD-10-CM | POA: Diagnosis not present

## 2023-01-06 DIAGNOSIS — D509 Iron deficiency anemia, unspecified: Secondary | ICD-10-CM | POA: Diagnosis not present

## 2023-01-06 NOTE — Progress Notes (Signed)
[facility] Counselor/Therapist Progress Note  Patient ID: Felicia Thompson, MRN: [id number]    Date: 01/06/23  Time Spent: 12:42  pm - 1:18 pm : 36 Minutes  Treatment Type: Individual Therapy.  Reported Symptoms: Patient reported normal mood today.   Mental Status Exam: Appearance:  Could not assess      Behavior: Could not assess  Motor: Could not assess  Speech/Language:  Clear and Coherent  Affect: Could not assess  Mood: normal  Thought process: normal  Thought content:   WNL  Sensory/Perceptual disturbances:   WNL  Orientation: oriented to person, place, and situation  Attention: Good  Concentration: Good  Memory: WNL  Fund of knowledge:  Good  Insight:   Fair  Judgment:  Good  Impulse Control: Good   Risk Assessment: Danger to Self:  No Patient denied current suicidal ideation Self-injurious Behavior: No Danger to Others: No Patient denied current homicidal ideation Duty to Warn:no Physical Aggression / Violence:No  Access to Firearms a concern: No  Gang Involvement:No   Subjective:  Patient stated, "actually things have been good" when clinician inquired about status since last session. Patient reported "ok" in response to patient's mood since last session. Patient reported a recent visit with her daughter in [place] and reported the visit went well. Patient reported she experienced the loss of her husband, brother, and mother since 2019 and reported feeling she has not grieved their deaths. Patient stated, "I could see doing those" in response to recommendations for treatment. Patient reported she no longer drives and requires virtual visits. Patient reported she would like to get out of her home and walk more frequently. Patient reported she would like to take a shower more than once a week when clinician inquired about potential goals for therapy.   Interventions: Motivational Interviewing. Clinician conducted session via telephone from  clinician's home office due to patient experiencing difficulty logging onto WebEx. Patient provided verbal consent to proceed with telehealth session and participated in session from patient's home. Reviewed events since last session. Clinician reviewed diagnosis and treatment recommendations. Provided psycho education related to diagnosis and treatment. Clinician utilized motivational interviewing to explore potential goals for therapy.   Diagnosis:  Adjustment disorder with depressed mood   Plan: Goals to be determined during follow up appointment on 01/27/23.          Katherina Right, LCSW

## 2023-01-07 DIAGNOSIS — N186 End stage renal disease: Secondary | ICD-10-CM | POA: Diagnosis not present

## 2023-01-07 DIAGNOSIS — N2589 Other disorders resulting from impaired renal tubular function: Secondary | ICD-10-CM | POA: Diagnosis not present

## 2023-01-07 DIAGNOSIS — E1129 Type 2 diabetes mellitus with other diabetic kidney complication: Secondary | ICD-10-CM | POA: Diagnosis not present

## 2023-01-07 DIAGNOSIS — Z992 Dependence on renal dialysis: Secondary | ICD-10-CM | POA: Diagnosis not present

## 2023-01-07 DIAGNOSIS — D509 Iron deficiency anemia, unspecified: Secondary | ICD-10-CM | POA: Diagnosis not present

## 2023-01-07 DIAGNOSIS — N2581 Secondary hyperparathyroidism of renal origin: Secondary | ICD-10-CM | POA: Diagnosis not present

## 2023-01-07 DIAGNOSIS — D631 Anemia in chronic kidney disease: Secondary | ICD-10-CM | POA: Diagnosis not present

## 2023-01-07 DIAGNOSIS — K769 Liver disease, unspecified: Secondary | ICD-10-CM | POA: Diagnosis not present

## 2023-01-07 DIAGNOSIS — R82998 Other abnormal findings in urine: Secondary | ICD-10-CM | POA: Diagnosis not present

## 2023-01-08 DIAGNOSIS — N2589 Other disorders resulting from impaired renal tubular function: Secondary | ICD-10-CM | POA: Diagnosis not present

## 2023-01-08 DIAGNOSIS — K769 Liver disease, unspecified: Secondary | ICD-10-CM | POA: Diagnosis not present

## 2023-01-08 DIAGNOSIS — I480 Paroxysmal atrial fibrillation: Secondary | ICD-10-CM | POA: Diagnosis not present

## 2023-01-08 DIAGNOSIS — Z992 Dependence on renal dialysis: Secondary | ICD-10-CM | POA: Diagnosis not present

## 2023-01-08 DIAGNOSIS — D631 Anemia in chronic kidney disease: Secondary | ICD-10-CM | POA: Diagnosis not present

## 2023-01-08 DIAGNOSIS — R82998 Other abnormal findings in urine: Secondary | ICD-10-CM | POA: Diagnosis not present

## 2023-01-08 DIAGNOSIS — N186 End stage renal disease: Secondary | ICD-10-CM | POA: Diagnosis not present

## 2023-01-08 DIAGNOSIS — E1129 Type 2 diabetes mellitus with other diabetic kidney complication: Secondary | ICD-10-CM | POA: Diagnosis not present

## 2023-01-08 DIAGNOSIS — D509 Iron deficiency anemia, unspecified: Secondary | ICD-10-CM | POA: Diagnosis not present

## 2023-01-08 DIAGNOSIS — N2581 Secondary hyperparathyroidism of renal origin: Secondary | ICD-10-CM | POA: Diagnosis not present

## 2023-01-09 DIAGNOSIS — N2581 Secondary hyperparathyroidism of renal origin: Secondary | ICD-10-CM | POA: Diagnosis not present

## 2023-01-09 DIAGNOSIS — K769 Liver disease, unspecified: Secondary | ICD-10-CM | POA: Diagnosis not present

## 2023-01-09 DIAGNOSIS — N186 End stage renal disease: Secondary | ICD-10-CM | POA: Diagnosis not present

## 2023-01-09 DIAGNOSIS — E1129 Type 2 diabetes mellitus with other diabetic kidney complication: Secondary | ICD-10-CM | POA: Diagnosis not present

## 2023-01-09 DIAGNOSIS — Z992 Dependence on renal dialysis: Secondary | ICD-10-CM | POA: Diagnosis not present

## 2023-01-09 DIAGNOSIS — D631 Anemia in chronic kidney disease: Secondary | ICD-10-CM | POA: Diagnosis not present

## 2023-01-09 DIAGNOSIS — I4891 Unspecified atrial fibrillation: Secondary | ICD-10-CM | POA: Diagnosis not present

## 2023-01-09 DIAGNOSIS — R82998 Other abnormal findings in urine: Secondary | ICD-10-CM | POA: Diagnosis not present

## 2023-01-09 DIAGNOSIS — N2589 Other disorders resulting from impaired renal tubular function: Secondary | ICD-10-CM | POA: Diagnosis not present

## 2023-01-09 DIAGNOSIS — D509 Iron deficiency anemia, unspecified: Secondary | ICD-10-CM | POA: Diagnosis not present

## 2023-01-10 DIAGNOSIS — N186 End stage renal disease: Secondary | ICD-10-CM | POA: Diagnosis not present

## 2023-01-10 DIAGNOSIS — E1129 Type 2 diabetes mellitus with other diabetic kidney complication: Secondary | ICD-10-CM | POA: Diagnosis not present

## 2023-01-10 DIAGNOSIS — N2581 Secondary hyperparathyroidism of renal origin: Secondary | ICD-10-CM | POA: Diagnosis not present

## 2023-01-10 DIAGNOSIS — K769 Liver disease, unspecified: Secondary | ICD-10-CM | POA: Diagnosis not present

## 2023-01-10 DIAGNOSIS — D509 Iron deficiency anemia, unspecified: Secondary | ICD-10-CM | POA: Diagnosis not present

## 2023-01-10 DIAGNOSIS — N2589 Other disorders resulting from impaired renal tubular function: Secondary | ICD-10-CM | POA: Diagnosis not present

## 2023-01-10 DIAGNOSIS — Z992 Dependence on renal dialysis: Secondary | ICD-10-CM | POA: Diagnosis not present

## 2023-01-10 DIAGNOSIS — D631 Anemia in chronic kidney disease: Secondary | ICD-10-CM | POA: Diagnosis not present

## 2023-01-10 DIAGNOSIS — R82998 Other abnormal findings in urine: Secondary | ICD-10-CM | POA: Diagnosis not present

## 2023-01-11 DIAGNOSIS — E1129 Type 2 diabetes mellitus with other diabetic kidney complication: Secondary | ICD-10-CM | POA: Diagnosis not present

## 2023-01-11 DIAGNOSIS — D631 Anemia in chronic kidney disease: Secondary | ICD-10-CM | POA: Diagnosis not present

## 2023-01-11 DIAGNOSIS — K769 Liver disease, unspecified: Secondary | ICD-10-CM | POA: Diagnosis not present

## 2023-01-11 DIAGNOSIS — Z992 Dependence on renal dialysis: Secondary | ICD-10-CM | POA: Diagnosis not present

## 2023-01-11 DIAGNOSIS — D509 Iron deficiency anemia, unspecified: Secondary | ICD-10-CM | POA: Diagnosis not present

## 2023-01-11 DIAGNOSIS — N2589 Other disorders resulting from impaired renal tubular function: Secondary | ICD-10-CM | POA: Diagnosis not present

## 2023-01-11 DIAGNOSIS — R82998 Other abnormal findings in urine: Secondary | ICD-10-CM | POA: Diagnosis not present

## 2023-01-11 DIAGNOSIS — N2581 Secondary hyperparathyroidism of renal origin: Secondary | ICD-10-CM | POA: Diagnosis not present

## 2023-01-11 DIAGNOSIS — N186 End stage renal disease: Secondary | ICD-10-CM | POA: Diagnosis not present

## 2023-01-12 DIAGNOSIS — D631 Anemia in chronic kidney disease: Secondary | ICD-10-CM | POA: Diagnosis not present

## 2023-01-12 DIAGNOSIS — N2589 Other disorders resulting from impaired renal tubular function: Secondary | ICD-10-CM | POA: Diagnosis not present

## 2023-01-12 DIAGNOSIS — Z992 Dependence on renal dialysis: Secondary | ICD-10-CM | POA: Diagnosis not present

## 2023-01-12 DIAGNOSIS — R82998 Other abnormal findings in urine: Secondary | ICD-10-CM | POA: Diagnosis not present

## 2023-01-12 DIAGNOSIS — D509 Iron deficiency anemia, unspecified: Secondary | ICD-10-CM | POA: Diagnosis not present

## 2023-01-12 DIAGNOSIS — N2581 Secondary hyperparathyroidism of renal origin: Secondary | ICD-10-CM | POA: Diagnosis not present

## 2023-01-12 DIAGNOSIS — N186 End stage renal disease: Secondary | ICD-10-CM | POA: Diagnosis not present

## 2023-01-12 DIAGNOSIS — E1129 Type 2 diabetes mellitus with other diabetic kidney complication: Secondary | ICD-10-CM | POA: Diagnosis not present

## 2023-01-12 DIAGNOSIS — K769 Liver disease, unspecified: Secondary | ICD-10-CM | POA: Diagnosis not present

## 2023-01-13 ENCOUNTER — Encounter: Payer: Self-pay | Admitting: Ophthalmology

## 2023-01-13 DIAGNOSIS — D631 Anemia in chronic kidney disease: Secondary | ICD-10-CM | POA: Diagnosis not present

## 2023-01-13 DIAGNOSIS — N186 End stage renal disease: Secondary | ICD-10-CM | POA: Diagnosis not present

## 2023-01-13 DIAGNOSIS — N2581 Secondary hyperparathyroidism of renal origin: Secondary | ICD-10-CM | POA: Diagnosis not present

## 2023-01-13 DIAGNOSIS — R82998 Other abnormal findings in urine: Secondary | ICD-10-CM | POA: Diagnosis not present

## 2023-01-13 DIAGNOSIS — K769 Liver disease, unspecified: Secondary | ICD-10-CM | POA: Diagnosis not present

## 2023-01-13 DIAGNOSIS — E1129 Type 2 diabetes mellitus with other diabetic kidney complication: Secondary | ICD-10-CM | POA: Diagnosis not present

## 2023-01-13 DIAGNOSIS — D509 Iron deficiency anemia, unspecified: Secondary | ICD-10-CM | POA: Diagnosis not present

## 2023-01-13 DIAGNOSIS — Z992 Dependence on renal dialysis: Secondary | ICD-10-CM | POA: Diagnosis not present

## 2023-01-13 DIAGNOSIS — N2589 Other disorders resulting from impaired renal tubular function: Secondary | ICD-10-CM | POA: Diagnosis not present

## 2023-01-13 NOTE — Discharge Instructions (Signed)

## 2023-01-14 DIAGNOSIS — N2589 Other disorders resulting from impaired renal tubular function: Secondary | ICD-10-CM | POA: Diagnosis not present

## 2023-01-14 DIAGNOSIS — N2581 Secondary hyperparathyroidism of renal origin: Secondary | ICD-10-CM | POA: Diagnosis not present

## 2023-01-14 DIAGNOSIS — E1129 Type 2 diabetes mellitus with other diabetic kidney complication: Secondary | ICD-10-CM | POA: Diagnosis not present

## 2023-01-14 DIAGNOSIS — Z992 Dependence on renal dialysis: Secondary | ICD-10-CM | POA: Diagnosis not present

## 2023-01-14 DIAGNOSIS — K769 Liver disease, unspecified: Secondary | ICD-10-CM | POA: Diagnosis not present

## 2023-01-14 DIAGNOSIS — D509 Iron deficiency anemia, unspecified: Secondary | ICD-10-CM | POA: Diagnosis not present

## 2023-01-14 DIAGNOSIS — N186 End stage renal disease: Secondary | ICD-10-CM | POA: Diagnosis not present

## 2023-01-14 DIAGNOSIS — D631 Anemia in chronic kidney disease: Secondary | ICD-10-CM | POA: Diagnosis not present

## 2023-01-14 DIAGNOSIS — R82998 Other abnormal findings in urine: Secondary | ICD-10-CM | POA: Diagnosis not present

## 2023-01-15 ENCOUNTER — Encounter: Payer: Self-pay | Admitting: Ophthalmology

## 2023-01-15 ENCOUNTER — Other Ambulatory Visit: Payer: Self-pay

## 2023-01-15 ENCOUNTER — Encounter
Admission: RE | Disposition: A | Discharge status: Home / Self Care | Payer: Self-pay | Source: Home / Self Care | Attending: Ophthalmology

## 2023-01-15 ENCOUNTER — Ambulatory Visit
Admission: RE | Admit: 2023-01-15 | Discharge: 2023-01-15 | Disposition: A | Discharge status: Home / Self Care | Payer: Medicare Other | Attending: Ophthalmology | Admitting: Ophthalmology

## 2023-01-15 ENCOUNTER — Ambulatory Visit: Payer: Medicare Other | Admitting: Anesthesiology

## 2023-01-15 DIAGNOSIS — D631 Anemia in chronic kidney disease: Secondary | ICD-10-CM | POA: Diagnosis not present

## 2023-01-15 DIAGNOSIS — F32A Depression, unspecified: Secondary | ICD-10-CM | POA: Insufficient documentation

## 2023-01-15 DIAGNOSIS — G4733 Obstructive sleep apnea (adult) (pediatric): Secondary | ICD-10-CM | POA: Insufficient documentation

## 2023-01-15 DIAGNOSIS — E785 Hyperlipidemia, unspecified: Secondary | ICD-10-CM | POA: Diagnosis not present

## 2023-01-15 DIAGNOSIS — D509 Iron deficiency anemia, unspecified: Secondary | ICD-10-CM | POA: Diagnosis not present

## 2023-01-15 DIAGNOSIS — N186 End stage renal disease: Secondary | ICD-10-CM | POA: Diagnosis not present

## 2023-01-15 DIAGNOSIS — N2589 Other disorders resulting from impaired renal tubular function: Secondary | ICD-10-CM | POA: Diagnosis not present

## 2023-01-15 DIAGNOSIS — E1122 Type 2 diabetes mellitus with diabetic chronic kidney disease: Secondary | ICD-10-CM | POA: Diagnosis not present

## 2023-01-15 DIAGNOSIS — H2512 Age-related nuclear cataract, left eye: Secondary | ICD-10-CM | POA: Diagnosis not present

## 2023-01-15 DIAGNOSIS — I120 Hypertensive chronic kidney disease with stage 5 chronic kidney disease or end stage renal disease: Secondary | ICD-10-CM | POA: Insufficient documentation

## 2023-01-15 DIAGNOSIS — E1129 Type 2 diabetes mellitus with other diabetic kidney complication: Secondary | ICD-10-CM | POA: Diagnosis not present

## 2023-01-15 DIAGNOSIS — K769 Liver disease, unspecified: Secondary | ICD-10-CM | POA: Diagnosis not present

## 2023-01-15 DIAGNOSIS — R82998 Other abnormal findings in urine: Secondary | ICD-10-CM | POA: Diagnosis not present

## 2023-01-15 DIAGNOSIS — Z136 Encounter for screening for cardiovascular disorders: Secondary | ICD-10-CM | POA: Diagnosis not present

## 2023-01-15 DIAGNOSIS — Z992 Dependence on renal dialysis: Secondary | ICD-10-CM | POA: Diagnosis not present

## 2023-01-15 DIAGNOSIS — H269 Unspecified cataract: Secondary | ICD-10-CM | POA: Diagnosis not present

## 2023-01-15 DIAGNOSIS — E1136 Type 2 diabetes mellitus with diabetic cataract: Secondary | ICD-10-CM | POA: Diagnosis not present

## 2023-01-15 DIAGNOSIS — N2581 Secondary hyperparathyroidism of renal origin: Secondary | ICD-10-CM | POA: Diagnosis not present

## 2023-01-15 HISTORY — PX: CATARACT EXTRACTION W/PHACO: SHX586

## 2023-01-15 HISTORY — DX: Dyspnea, unspecified: R06.00

## 2023-01-15 HISTORY — DX: Motion sickness, initial encounter: T75.3XXA

## 2023-01-15 HISTORY — DX: Other complications of anesthesia, initial encounter: T88.59XA

## 2023-01-15 HISTORY — DX: Anemia, unspecified: D64.9

## 2023-01-15 LAB — GLUCOSE, CAPILLARY: Glucose-Capillary: 285 mg/dL — ABNORMAL HIGH (ref 70–99)

## 2023-01-15 SURGERY — PHACOEMULSIFICATION, CATARACT, WITH IOL INSERTION
Anesthesia: Monitor Anesthesia Care | Site: Eye | Laterality: Left

## 2023-01-15 MED ORDER — SIGHTPATH DOSE#1 BSS IO SOLN
INTRAOCULAR | Status: DC | PRN
  Administered 2023-01-15: 1 mL via INTRAMUSCULAR

## 2023-01-15 MED ORDER — CEFUROXIME OPHTHALMIC INJECTION 1 MG/0.1 ML
INJECTION | OPHTHALMIC | Status: DC | PRN
  Administered 2023-01-15: .1 mL via INTRACAMERAL

## 2023-01-15 MED ORDER — SIGHTPATH DOSE#1 BSS IO SOLN
INTRAOCULAR | Status: DC | PRN
  Administered 2023-01-15: 71 mL via OPHTHALMIC

## 2023-01-15 MED ORDER — LACTATED RINGERS IV SOLN: INTRAVENOUS | Status: DC

## 2023-01-15 MED ORDER — LACTATED RINGERS IV SOLN: INTRAVENOUS | Status: DC | PRN

## 2023-01-15 MED ORDER — SIGHTPATH DOSE#1 BSS IO SOLN
INTRAOCULAR | Status: DC | PRN
  Administered 2023-01-15: 15 mL

## 2023-01-15 MED ORDER — MIDAZOLAM HCL 2 MG/2ML IJ SOLN
INTRAMUSCULAR | Status: DC | PRN
  Administered 2023-01-15: 2 mg via INTRAVENOUS

## 2023-01-15 MED ORDER — TETRACAINE HCL 0.5 % OP SOLN
1.0000 [drp] | OPHTHALMIC | Status: DC | PRN
  Administered 2023-01-15 (×3): 1 [drp] via OPHTHALMIC

## 2023-01-15 MED ORDER — ARMC OPHTHALMIC DILATING DROPS
1.0000 | OPHTHALMIC | Status: DC | PRN
  Administered 2023-01-15 (×3): 1 via OPHTHALMIC

## 2023-01-15 MED ORDER — SIGHTPATH DOSE#1 NA HYALUR & NA CHOND-NA HYALUR IO KIT
PACK | INTRAOCULAR | Status: DC | PRN
  Administered 2023-01-15: 1 via OPHTHALMIC

## 2023-01-15 MED ORDER — FENTANYL CITRATE (PF) 100 MCG/2ML IJ SOLN
INTRAMUSCULAR | Status: DC | PRN
  Administered 2023-01-15: 50 ug via INTRAVENOUS

## 2023-01-15 MED ORDER — BRIMONIDINE TARTRATE-TIMOLOL 0.2-0.5 % OP SOLN
OPHTHALMIC | Status: DC | PRN
  Administered 2023-01-15: 1 [drp] via OPHTHALMIC

## 2023-01-15 SURGICAL SUPPLY — 10 items
CATARACT SUITE SIGHTPATH (MISCELLANEOUS) ×1 IMPLANT
FEE CATARACT SUITE SIGHTPATH (MISCELLANEOUS) ×1 IMPLANT
GLOVE SRG 8 PF TXTR STRL LF DI (GLOVE) ×1 IMPLANT
GLOVE SURG ENC TEXT LTX SZ7.5 (GLOVE) ×1 IMPLANT
GLOVE SURG UNDER POLY LF SZ8 (GLOVE) ×1
LENS IOL TECNIS EYHANCE 21.5 (Intraocular Lens) IMPLANT
NDL FILTER BLUNT 18X1 1/2 (NEEDLE) ×1 IMPLANT
NEEDLE FILTER BLUNT 18X1 1/2 (NEEDLE) ×1 IMPLANT
SYR 3ML LL SCALE MARK (SYRINGE) ×1 IMPLANT
WATER STERILE IRR 250ML POUR (IV SOLUTION) ×1 IMPLANT

## 2023-01-15 NOTE — Transfer of Care (Signed)
Immediate Anesthesia Transfer of Care Note  Patient: Felicia Thompson  Procedure(s) Performed: CATARACT EXTRACTION PHACO AND INTRAOCULAR LENS PLACEMENT (IOC) LEFT DIABETIC  5.36  00:35.6 (Left: Eye)  Patient Location: PACU  Anesthesia Type: MAC  Level of Consciousness: awake, alert  and patient cooperative  Airway and Oxygen Therapy: Patient Spontanous Breathing and Patient connected to supplemental oxygen  Post-op Assessment: Post-op Vital signs reviewed, Patient's Cardiovascular Status Stable, Respiratory Function Stable, Patent Airway and No signs of Nausea or vomiting  Post-op Vital Signs: Reviewed and stable  Complications: No notable events documented.

## 2023-01-15 NOTE — Op Note (Signed)
OPERATIVE NOTE  Lauraanne Eymard [id number] 01/15/2023   PREOPERATIVE DIAGNOSIS:  Nuclear sclerotic cataract left eye. H25.12   POSTOPERATIVE DIAGNOSIS:    Nuclear sclerotic cataract left eye.     PROCEDURE:  Phacoemusification with posterior chamber intraocular lens placement of the left eye  Ultrasound time: Procedure(s) with comments: CATARACT EXTRACTION PHACO AND INTRAOCULAR LENS PLACEMENT (IOC) LEFT DIABETIC  5.36  00:35.6 (Left) - Diabetic  LENS:   Implant Name Type Inv. Item Serial No. Manufacturer Lot No. LRB No. Used Action  LENS IOL TECNIS EYHANCE 21.5 - [id number] Intraocular Lens LENS IOL TECNIS EYHANCE 21.5 [id number] [employer]  Left 1 Implanted      SURGEON:  Wyonia Hough, MD   ANESTHESIA:  Topical with tetracaine drops and 2% Xylocaine jelly, augmented with 1% preservative-free intracameral lidocaine.    COMPLICATIONS:  None.   DESCRIPTION OF PROCEDURE:  The patient was identified in the holding room and transported to the operating room and placed in the supine position under the operating microscope.  The left eye was identified as the operative eye and it was prepped and draped in the usual sterile ophthalmic fashion.   A 1 millimeter clear-corneal paracentesis was made at the 1:30 position.  0.5 ml of preservative-free 1% lidocaine was injected into the anterior chamber.  The anterior chamber was filled with Viscoat viscoelastic.  A 2.4 millimeter keratome was used to make a near-clear corneal incision at the 10:30 position.  .  A curvilinear capsulorrhexis was made with a cystotome and capsulorrhexis forceps.  Balanced salt solution was used to hydrodissect and hydrodelineate the nucleus.   Phacoemulsification was then used in stop and chop fashion to remove the lens nucleus and epinucleus.  The remaining cortex was then removed using the irrigation and aspiration handpiece. Provisc was then placed into the capsular bag to distend it for lens  placement.  A lens was then injected into the capsular bag.  The remaining viscoelastic was aspirated.   Wounds were hydrated with balanced salt solution.  The anterior chamber was inflated to a physiologic pressure with balanced salt solution.  No wound leaks were noted. Cefuroxime 0.1 ml of a 66m/ml solution was injected into the anterior chamber for a dose of 1 mg of intracameral antibiotic at the completion of the case.   Timolol and Brimonidine drops were applied to the eye.  The patient was taken to the recovery room in stable condition without complications of anesthesia or surgery.  Vivan Vanderveer 01/15/2023, 11:10 AM

## 2023-01-15 NOTE — Anesthesia Preprocedure Evaluation (Signed)
Anesthesia Evaluation  Patient identified by MRN, date of birth, ID band Patient awake    Reviewed: Allergy & Precautions, H&P , NPO status , Patient's Chart, lab work & pertinent test results, reviewed documented beta blocker date and time   History of Anesthesia Complications (+) AWARENESS UNDER ANESTHESIA and history of anesthetic complications  Airway Mallampati: II  TM Distance: >3 FB Neck ROM: full    Dental  (+) Dental Advidsory Given, Caps, Poor Dentition, Missing   Pulmonary neg shortness of breath, sleep apnea , neg COPD, neg recent URI   Pulmonary exam normal breath sounds clear to auscultation       Cardiovascular Exercise Tolerance: Good hypertension, (-) angina (-) Past MI and (-) Cardiac Stents Normal cardiovascular exam(-) dysrhythmias (-) Valvular Problems/Murmurs Rhythm:regular Rate:Normal     Neuro/Psych  PSYCHIATRIC DISORDERS  Depression    negative neurological ROS     GI/Hepatic negative GI ROS, Neg liver ROS,,,  Endo/Other  negative endocrine ROSdiabetes    Renal/GU ESRF and DialysisRenal disease (on PD)  negative genitourinary   Musculoskeletal   Abdominal   Peds  Hematology negative hematology ROS (+)   Anesthesia Other Findings Past Medical History: No date: Anemia No date: Arthritis No date: Blood in stool     Comment:  when appendix burst No date: Chronic kidney disease     Comment:  Stage 4, being evaluated for transplant No date: Complication of anesthesia     Comment:  1 episode, pt was paralysed but still aware of ongoing               events No date: Depression No date: DM (diabetes mellitus), type 2 with complications (HCC) No date: Dyspnea     Comment:  with mild exertion No date: ESRD on peritoneal dialysis (HCC) No date: History of fainting spells of unknown cause No date: Hyperlipidemia No date: Hyperlipidemia associated with type 2 diabetes mellitus ([place]) No date:  Hypertension No date: Motion sickness     Comment:  ocean ship No date: OSA (obstructive sleep apnea)   Reproductive/Obstetrics negative OB ROS                             Anesthesia Physical Anesthesia Plan  ASA: 4  Anesthesia Plan: MAC   Post-op Pain Management:    Induction:   PONV Risk Score and Plan: 2 and Midazolam and Treatment may vary due to age or medical condition  Airway Management Planned: Natural Airway and Nasal Cannula  Additional Equipment:   Intra-op Plan:   Post-operative Plan:   Informed Consent: I have reviewed the patients History and Physical, chart, labs and discussed the procedure including the risks, benefits and alternatives for the proposed anesthesia with the patient or authorized representative who has indicated his/her understanding and acceptance.     Dental Advisory Given  Plan Discussed with: Anesthesiologist, CRNA and Surgeon  Anesthesia Plan Comments:         Anesthesia Quick Evaluation

## 2023-01-15 NOTE — Anesthesia Postprocedure Evaluation (Signed)
Anesthesia Post Note  Patient: Trina Deaton  Procedure(s) Performed: CATARACT EXTRACTION PHACO AND INTRAOCULAR LENS PLACEMENT (IOC) LEFT DIABETIC  5.36  00:35.6 (Left: Eye)  Patient location during evaluation: PACU Anesthesia Type: MAC Level of consciousness: awake and alert Pain management: pain level controlled Vital Signs Assessment: post-procedure vital signs reviewed and stable Respiratory status: spontaneous breathing, nonlabored ventilation, respiratory function stable and patient connected to nasal cannula oxygen Cardiovascular status: stable and blood pressure returned to baseline Postop Assessment: no apparent nausea or vomiting Anesthetic complications: no   No notable events documented.   Last Vitals:  Vitals:   01/15/23 1111 01/15/23 1116  BP: (!) 163/83 (!) 164/90  Pulse: 97 89  Resp: 10 14  Temp: (!) 36.1 C (!) 36.1 C  SpO2: 98% 98%    Last Pain:  Vitals:   01/15/23 1116  TempSrc:   PainSc: 0-No pain                 Martha Clan

## 2023-01-15 NOTE — H&P (Signed)
[facility]   Primary Care Physician:  Isaac Bliss, Rayford Halsted, MD Ophthalmologist: Dr. Leandrew Koyanagi  Pre-Procedure History & Physical: HPI:  Felicia Thompson is a 77 y.o. female here for ophthalmic surgery.   Past Medical History:  Diagnosis Date   Anemia    Arthritis    Blood in stool    when appendix burst   Chronic kidney disease    Stage 4, being evaluated for transplant   Complication of anesthesia    1 episode, pt was paralysed but still aware of ongoing events   Depression    DM (diabetes mellitus), type 2 with complications (HCC)    Dyspnea    with mild exertion   ESRD on peritoneal dialysis ([place])    History of fainting spells of unknown cause    Hyperlipidemia    Hyperlipidemia associated with type 2 diabetes mellitus ([place])    Hypertension    Motion sickness    ocean ship   OSA (obstructive sleep apnea)     Past Surgical History:  Procedure Laterality Date   ABDOMINAL HYSTERECTOMY     APPENDECTOMY     CHOLECYSTECTOMY     REPLACEMENT TOTAL KNEE BILATERAL Bilateral    TONSILECTOMY, ADENOIDECTOMY, BILATERAL MYRINGOTOMY AND TUBES     TOTAL HIP ARTHROPLASTY Bilateral     Prior to Admission medications   Medication Sig Start Date End Date Taking? Authorizing Provider  amLODipine (NORVASC) 5 MG tablet Take 5 mg by mouth daily.   Yes [provider]  aspirin 81 MG chewable tablet Chew 81 mg by mouth daily.   Yes [provider]  cloNIDine (CATAPRES) 0.1 MG tablet Take 0.1 mg by mouth 2 (two) times daily.   Yes [provider]  diphenhydramine-acetaminophen (TYLENOL PM) 25-500 MG TABS tablet Take 1 tablet by mouth at bedtime as needed.   Yes [provider]  insulin NPH-regular Human (HUMULIN 70/30) (70-30) 100 UNIT/ML injection Inject 10 Units into the skin daily with breakfast. Patient taking differently: Inject 10 Units into the skin at bedtime. 08/17/21  Yes Renato Shin, MD  losartan (COZAAR) 50 MG  tablet Take 1 tablet (50 mg total) by mouth daily. Patient taking differently: Take 50 mg by mouth 2 (two) times daily. 10/12/21  Yes Lora Havens, MD  Multiple Vitamins-Minerals (RENAPLEX) TABS Take 1 tablet by mouth daily. 08/30/21  Yes [provider]  ondansetron (ZOFRAN) 4 MG tablet Take 1 tablet (4 mg total) by mouth every 6 (six) hours as needed for nausea. 11/13/22  Yes Sidney Ace, MD  Probiotic Product ([employer] ADV PROBIOTIC GUMMIES PO) Take 1 tablet by mouth at bedtime.   Yes [provider]  sevelamer carbonate (RENVELA) 800 MG tablet Take 2,400 mg by mouth 3 (three) times daily with meals.   Yes [provider]  venlafaxine XR (EFFEXOR-XR) 150 MG 24 hr capsule Take 1 capsule (150 mg total) by mouth daily with breakfast. 03/26/22  Yes Isaac Bliss, Rayford Halsted, MD    Allergies as of 09/18/2022 - Review Complete 08/26/2022  Allergen Reaction Noted   Tape Other (See Comments) 05/15/2021   Tetracyclines & related Rash 05/15/2021    Family History  Problem Relation Age of Onset   Hearing loss Father    Hyperlipidemia Father    Heart disease Father    Early death Father    Cancer Father    Early death Brother    Drug abuse Brother    Diabetes Brother  Depression Brother    Alcohol abuse Brother    Miscarriages / [place] Daughter    Arthritis Daughter    Depression Daughter    Early death Maternal Grandmother    Heart attack Maternal Grandmother    Heart disease Maternal Grandmother    Hearing loss Paternal Grandmother    Heart disease Paternal Grandmother    Heart disease Paternal Grandfather    Early death Paternal Grandfather    Heart attack Paternal Grandfather     Social History   Socioeconomic History   Marital status: Widowed    Spouse name: Not on file   Number of children: 4   Years of education: Not on file   Highest education level: Master's degree (e.g., MA, MS, MEng, MEd, MSW, MBA)  Occupational History   Not  on file  Tobacco Use   Smoking status: Never   Smokeless tobacco: Never  Vaping Use   Vaping Use: Never used  Substance and Sexual Activity   Alcohol use: Never   Drug use: Never   Sexual activity: Not Currently  Other Topics Concern   Not on file  Social History Narrative   Lives with daughter and son in law and 2 children   Right handed    Caffeine: 3 cup a week   Social Determinants of Health   Financial Resource Strain: Low Risk  (08/22/2022)   Overall Financial Resource Strain (CARDIA)    Difficulty of Paying Living Expenses: Not hard at all  Food Insecurity: No Food Insecurity (11/14/2022)   Hunger Vital Sign    Worried About Running Out of Food in the Last Year: Never true    [place] in the Last Year: Never true  Transportation Needs: Unmet Transportation Needs (11/14/2022)   PRAPARE - Transportation    Lack of Transportation (Medical): Yes    Lack of Transportation (Non-Medical): Yes  Physical Activity: Unknown (10/04/2021)   Exercise Vital Sign    Days of Exercise per Week: 0 days    Minutes of Exercise per Session: Not on file  Stress: No Stress Concern Present (10/04/2021)   [place] of Stress : Not at all  Social Connections: Moderately Isolated (10/04/2021)   Social Connection and Isolation Panel [NHANES]    Frequency of Communication with Friends and Family: More than three times a week    Frequency of Social Gatherings with Friends and Family: Never    Attends Religious Services: 1 to 4 times per year    Active Member of [employer] or Organizations: No    Attends Archivist Meetings: Not on file    Marital Status: Widowed  Human resources officer Violence: Not on file    Review of Systems: See HPI, otherwise negative ROS  Physical Exam: BP 137/76   Pulse (!) 134   Temp (!) 97.3 F (36.3 C) (Temporal)   Resp 13   Ht 5' 7"$  (1.702 m)   Wt 77.9 kg   SpO2 100%   BMI  26.91 kg/m  General:   Alert,  pleasant and cooperative in NAD Head:  Normocephalic and atraumatic. Lungs:  Clear to auscultation.    Heart:  Regular rate and rhythm.   Impression/Plan: Felicia Thompson is here for ophthalmic surgery.  Risks, benefits, limitations, and alternatives regarding ophthalmic surgery have been reviewed with the patient.  Questions have been answered.  All parties agreeable.   Leandrew Koyanagi, MD  01/15/2023, 10:25 AM

## 2023-01-16 ENCOUNTER — Encounter: Payer: Self-pay | Admitting: Ophthalmology

## 2023-01-16 DIAGNOSIS — D631 Anemia in chronic kidney disease: Secondary | ICD-10-CM | POA: Diagnosis not present

## 2023-01-16 DIAGNOSIS — N2581 Secondary hyperparathyroidism of renal origin: Secondary | ICD-10-CM | POA: Diagnosis not present

## 2023-01-16 DIAGNOSIS — K769 Liver disease, unspecified: Secondary | ICD-10-CM | POA: Diagnosis not present

## 2023-01-16 DIAGNOSIS — N2589 Other disorders resulting from impaired renal tubular function: Secondary | ICD-10-CM | POA: Diagnosis not present

## 2023-01-16 DIAGNOSIS — E1129 Type 2 diabetes mellitus with other diabetic kidney complication: Secondary | ICD-10-CM | POA: Diagnosis not present

## 2023-01-16 DIAGNOSIS — D509 Iron deficiency anemia, unspecified: Secondary | ICD-10-CM | POA: Diagnosis not present

## 2023-01-16 DIAGNOSIS — N186 End stage renal disease: Secondary | ICD-10-CM | POA: Diagnosis not present

## 2023-01-16 DIAGNOSIS — Z992 Dependence on renal dialysis: Secondary | ICD-10-CM | POA: Diagnosis not present

## 2023-01-16 DIAGNOSIS — R82998 Other abnormal findings in urine: Secondary | ICD-10-CM | POA: Diagnosis not present

## 2023-01-17 DIAGNOSIS — N2581 Secondary hyperparathyroidism of renal origin: Secondary | ICD-10-CM | POA: Diagnosis not present

## 2023-01-17 DIAGNOSIS — Z992 Dependence on renal dialysis: Secondary | ICD-10-CM | POA: Diagnosis not present

## 2023-01-17 DIAGNOSIS — R82998 Other abnormal findings in urine: Secondary | ICD-10-CM | POA: Diagnosis not present

## 2023-01-17 DIAGNOSIS — K769 Liver disease, unspecified: Secondary | ICD-10-CM | POA: Diagnosis not present

## 2023-01-17 DIAGNOSIS — D631 Anemia in chronic kidney disease: Secondary | ICD-10-CM | POA: Diagnosis not present

## 2023-01-17 DIAGNOSIS — N2589 Other disorders resulting from impaired renal tubular function: Secondary | ICD-10-CM | POA: Diagnosis not present

## 2023-01-17 DIAGNOSIS — E1129 Type 2 diabetes mellitus with other diabetic kidney complication: Secondary | ICD-10-CM | POA: Diagnosis not present

## 2023-01-17 DIAGNOSIS — D509 Iron deficiency anemia, unspecified: Secondary | ICD-10-CM | POA: Diagnosis not present

## 2023-01-17 DIAGNOSIS — N186 End stage renal disease: Secondary | ICD-10-CM | POA: Diagnosis not present

## 2023-01-18 DIAGNOSIS — D509 Iron deficiency anemia, unspecified: Secondary | ICD-10-CM | POA: Diagnosis not present

## 2023-01-18 DIAGNOSIS — R82998 Other abnormal findings in urine: Secondary | ICD-10-CM | POA: Diagnosis not present

## 2023-01-18 DIAGNOSIS — N2589 Other disorders resulting from impaired renal tubular function: Secondary | ICD-10-CM | POA: Diagnosis not present

## 2023-01-18 DIAGNOSIS — N186 End stage renal disease: Secondary | ICD-10-CM | POA: Diagnosis not present

## 2023-01-18 DIAGNOSIS — K769 Liver disease, unspecified: Secondary | ICD-10-CM | POA: Diagnosis not present

## 2023-01-18 DIAGNOSIS — D631 Anemia in chronic kidney disease: Secondary | ICD-10-CM | POA: Diagnosis not present

## 2023-01-18 DIAGNOSIS — N2581 Secondary hyperparathyroidism of renal origin: Secondary | ICD-10-CM | POA: Diagnosis not present

## 2023-01-18 DIAGNOSIS — E1129 Type 2 diabetes mellitus with other diabetic kidney complication: Secondary | ICD-10-CM | POA: Diagnosis not present

## 2023-01-18 DIAGNOSIS — Z992 Dependence on renal dialysis: Secondary | ICD-10-CM | POA: Diagnosis not present

## 2023-01-19 DIAGNOSIS — D631 Anemia in chronic kidney disease: Secondary | ICD-10-CM | POA: Diagnosis not present

## 2023-01-19 DIAGNOSIS — K769 Liver disease, unspecified: Secondary | ICD-10-CM | POA: Diagnosis not present

## 2023-01-19 DIAGNOSIS — N186 End stage renal disease: Secondary | ICD-10-CM | POA: Diagnosis not present

## 2023-01-19 DIAGNOSIS — R82998 Other abnormal findings in urine: Secondary | ICD-10-CM | POA: Diagnosis not present

## 2023-01-19 DIAGNOSIS — Z992 Dependence on renal dialysis: Secondary | ICD-10-CM | POA: Diagnosis not present

## 2023-01-19 DIAGNOSIS — N2581 Secondary hyperparathyroidism of renal origin: Secondary | ICD-10-CM | POA: Diagnosis not present

## 2023-01-19 DIAGNOSIS — D509 Iron deficiency anemia, unspecified: Secondary | ICD-10-CM | POA: Diagnosis not present

## 2023-01-19 DIAGNOSIS — N2589 Other disorders resulting from impaired renal tubular function: Secondary | ICD-10-CM | POA: Diagnosis not present

## 2023-01-19 DIAGNOSIS — E1129 Type 2 diabetes mellitus with other diabetic kidney complication: Secondary | ICD-10-CM | POA: Diagnosis not present

## 2023-01-20 DIAGNOSIS — N2589 Other disorders resulting from impaired renal tubular function: Secondary | ICD-10-CM | POA: Diagnosis not present

## 2023-01-20 DIAGNOSIS — E1129 Type 2 diabetes mellitus with other diabetic kidney complication: Secondary | ICD-10-CM | POA: Diagnosis not present

## 2023-01-20 DIAGNOSIS — D631 Anemia in chronic kidney disease: Secondary | ICD-10-CM | POA: Diagnosis not present

## 2023-01-20 DIAGNOSIS — R82998 Other abnormal findings in urine: Secondary | ICD-10-CM | POA: Diagnosis not present

## 2023-01-20 DIAGNOSIS — N2581 Secondary hyperparathyroidism of renal origin: Secondary | ICD-10-CM | POA: Diagnosis not present

## 2023-01-20 DIAGNOSIS — K769 Liver disease, unspecified: Secondary | ICD-10-CM | POA: Diagnosis not present

## 2023-01-20 DIAGNOSIS — D509 Iron deficiency anemia, unspecified: Secondary | ICD-10-CM | POA: Diagnosis not present

## 2023-01-20 DIAGNOSIS — N186 End stage renal disease: Secondary | ICD-10-CM | POA: Diagnosis not present

## 2023-01-20 DIAGNOSIS — Z992 Dependence on renal dialysis: Secondary | ICD-10-CM | POA: Diagnosis not present

## 2023-01-21 DIAGNOSIS — R82998 Other abnormal findings in urine: Secondary | ICD-10-CM | POA: Diagnosis not present

## 2023-01-21 DIAGNOSIS — K769 Liver disease, unspecified: Secondary | ICD-10-CM | POA: Diagnosis not present

## 2023-01-21 DIAGNOSIS — D509 Iron deficiency anemia, unspecified: Secondary | ICD-10-CM | POA: Diagnosis not present

## 2023-01-21 DIAGNOSIS — N2589 Other disorders resulting from impaired renal tubular function: Secondary | ICD-10-CM | POA: Diagnosis not present

## 2023-01-21 DIAGNOSIS — E1129 Type 2 diabetes mellitus with other diabetic kidney complication: Secondary | ICD-10-CM | POA: Diagnosis not present

## 2023-01-21 DIAGNOSIS — N186 End stage renal disease: Secondary | ICD-10-CM | POA: Diagnosis not present

## 2023-01-21 DIAGNOSIS — D631 Anemia in chronic kidney disease: Secondary | ICD-10-CM | POA: Diagnosis not present

## 2023-01-21 DIAGNOSIS — N2581 Secondary hyperparathyroidism of renal origin: Secondary | ICD-10-CM | POA: Diagnosis not present

## 2023-01-21 DIAGNOSIS — Z992 Dependence on renal dialysis: Secondary | ICD-10-CM | POA: Diagnosis not present

## 2023-01-22 DIAGNOSIS — E1129 Type 2 diabetes mellitus with other diabetic kidney complication: Secondary | ICD-10-CM | POA: Diagnosis not present

## 2023-01-22 DIAGNOSIS — D509 Iron deficiency anemia, unspecified: Secondary | ICD-10-CM | POA: Diagnosis not present

## 2023-01-22 DIAGNOSIS — Z992 Dependence on renal dialysis: Secondary | ICD-10-CM | POA: Diagnosis not present

## 2023-01-22 DIAGNOSIS — D631 Anemia in chronic kidney disease: Secondary | ICD-10-CM | POA: Diagnosis not present

## 2023-01-22 DIAGNOSIS — N2589 Other disorders resulting from impaired renal tubular function: Secondary | ICD-10-CM | POA: Diagnosis not present

## 2023-01-22 DIAGNOSIS — N2581 Secondary hyperparathyroidism of renal origin: Secondary | ICD-10-CM | POA: Diagnosis not present

## 2023-01-22 DIAGNOSIS — R82998 Other abnormal findings in urine: Secondary | ICD-10-CM | POA: Diagnosis not present

## 2023-01-22 DIAGNOSIS — N186 End stage renal disease: Secondary | ICD-10-CM | POA: Diagnosis not present

## 2023-01-22 DIAGNOSIS — K769 Liver disease, unspecified: Secondary | ICD-10-CM | POA: Diagnosis not present

## 2023-01-23 DIAGNOSIS — K769 Liver disease, unspecified: Secondary | ICD-10-CM | POA: Diagnosis not present

## 2023-01-23 DIAGNOSIS — D509 Iron deficiency anemia, unspecified: Secondary | ICD-10-CM | POA: Diagnosis not present

## 2023-01-23 DIAGNOSIS — N2581 Secondary hyperparathyroidism of renal origin: Secondary | ICD-10-CM | POA: Diagnosis not present

## 2023-01-23 DIAGNOSIS — N186 End stage renal disease: Secondary | ICD-10-CM | POA: Diagnosis not present

## 2023-01-23 DIAGNOSIS — D631 Anemia in chronic kidney disease: Secondary | ICD-10-CM | POA: Diagnosis not present

## 2023-01-23 DIAGNOSIS — N2589 Other disorders resulting from impaired renal tubular function: Secondary | ICD-10-CM | POA: Diagnosis not present

## 2023-01-23 DIAGNOSIS — R82998 Other abnormal findings in urine: Secondary | ICD-10-CM | POA: Diagnosis not present

## 2023-01-23 DIAGNOSIS — Z992 Dependence on renal dialysis: Secondary | ICD-10-CM | POA: Diagnosis not present

## 2023-01-23 DIAGNOSIS — E1129 Type 2 diabetes mellitus with other diabetic kidney complication: Secondary | ICD-10-CM | POA: Diagnosis not present

## 2023-01-24 DIAGNOSIS — N186 End stage renal disease: Secondary | ICD-10-CM | POA: Diagnosis not present

## 2023-01-24 DIAGNOSIS — N2581 Secondary hyperparathyroidism of renal origin: Secondary | ICD-10-CM | POA: Diagnosis not present

## 2023-01-24 DIAGNOSIS — R82998 Other abnormal findings in urine: Secondary | ICD-10-CM | POA: Diagnosis not present

## 2023-01-24 DIAGNOSIS — N2589 Other disorders resulting from impaired renal tubular function: Secondary | ICD-10-CM | POA: Diagnosis not present

## 2023-01-24 DIAGNOSIS — K769 Liver disease, unspecified: Secondary | ICD-10-CM | POA: Diagnosis not present

## 2023-01-24 DIAGNOSIS — E1129 Type 2 diabetes mellitus with other diabetic kidney complication: Secondary | ICD-10-CM | POA: Diagnosis not present

## 2023-01-24 DIAGNOSIS — Z992 Dependence on renal dialysis: Secondary | ICD-10-CM | POA: Diagnosis not present

## 2023-01-24 DIAGNOSIS — D631 Anemia in chronic kidney disease: Secondary | ICD-10-CM | POA: Diagnosis not present

## 2023-01-24 DIAGNOSIS — D509 Iron deficiency anemia, unspecified: Secondary | ICD-10-CM | POA: Diagnosis not present

## 2023-01-25 DIAGNOSIS — R82998 Other abnormal findings in urine: Secondary | ICD-10-CM | POA: Diagnosis not present

## 2023-01-25 DIAGNOSIS — N186 End stage renal disease: Secondary | ICD-10-CM | POA: Diagnosis not present

## 2023-01-25 DIAGNOSIS — D631 Anemia in chronic kidney disease: Secondary | ICD-10-CM | POA: Diagnosis not present

## 2023-01-25 DIAGNOSIS — Z992 Dependence on renal dialysis: Secondary | ICD-10-CM | POA: Diagnosis not present

## 2023-01-25 DIAGNOSIS — E1129 Type 2 diabetes mellitus with other diabetic kidney complication: Secondary | ICD-10-CM | POA: Diagnosis not present

## 2023-01-25 DIAGNOSIS — N2589 Other disorders resulting from impaired renal tubular function: Secondary | ICD-10-CM | POA: Diagnosis not present

## 2023-01-25 DIAGNOSIS — K769 Liver disease, unspecified: Secondary | ICD-10-CM | POA: Diagnosis not present

## 2023-01-25 DIAGNOSIS — N2581 Secondary hyperparathyroidism of renal origin: Secondary | ICD-10-CM | POA: Diagnosis not present

## 2023-01-25 DIAGNOSIS — D509 Iron deficiency anemia, unspecified: Secondary | ICD-10-CM | POA: Diagnosis not present

## 2023-01-26 DIAGNOSIS — N2589 Other disorders resulting from impaired renal tubular function: Secondary | ICD-10-CM | POA: Diagnosis not present

## 2023-01-26 DIAGNOSIS — N2581 Secondary hyperparathyroidism of renal origin: Secondary | ICD-10-CM | POA: Diagnosis not present

## 2023-01-26 DIAGNOSIS — D631 Anemia in chronic kidney disease: Secondary | ICD-10-CM | POA: Diagnosis not present

## 2023-01-26 DIAGNOSIS — E1129 Type 2 diabetes mellitus with other diabetic kidney complication: Secondary | ICD-10-CM | POA: Diagnosis not present

## 2023-01-26 DIAGNOSIS — K769 Liver disease, unspecified: Secondary | ICD-10-CM | POA: Diagnosis not present

## 2023-01-26 DIAGNOSIS — D509 Iron deficiency anemia, unspecified: Secondary | ICD-10-CM | POA: Diagnosis not present

## 2023-01-26 DIAGNOSIS — Z992 Dependence on renal dialysis: Secondary | ICD-10-CM | POA: Diagnosis not present

## 2023-01-26 DIAGNOSIS — R82998 Other abnormal findings in urine: Secondary | ICD-10-CM | POA: Diagnosis not present

## 2023-01-26 DIAGNOSIS — N186 End stage renal disease: Secondary | ICD-10-CM | POA: Diagnosis not present

## 2023-01-27 ENCOUNTER — Ambulatory Visit (INDEPENDENT_AMBULATORY_CARE_PROVIDER_SITE_OTHER): Payer: Medicare Other | Admitting: Clinical

## 2023-01-27 DIAGNOSIS — R82998 Other abnormal findings in urine: Secondary | ICD-10-CM | POA: Diagnosis not present

## 2023-01-27 DIAGNOSIS — N2589 Other disorders resulting from impaired renal tubular function: Secondary | ICD-10-CM | POA: Diagnosis not present

## 2023-01-27 DIAGNOSIS — N2581 Secondary hyperparathyroidism of renal origin: Secondary | ICD-10-CM | POA: Diagnosis not present

## 2023-01-27 DIAGNOSIS — Z992 Dependence on renal dialysis: Secondary | ICD-10-CM | POA: Diagnosis not present

## 2023-01-27 DIAGNOSIS — D631 Anemia in chronic kidney disease: Secondary | ICD-10-CM | POA: Diagnosis not present

## 2023-01-27 DIAGNOSIS — N186 End stage renal disease: Secondary | ICD-10-CM | POA: Diagnosis not present

## 2023-01-27 DIAGNOSIS — D509 Iron deficiency anemia, unspecified: Secondary | ICD-10-CM | POA: Diagnosis not present

## 2023-01-27 DIAGNOSIS — K769 Liver disease, unspecified: Secondary | ICD-10-CM | POA: Diagnosis not present

## 2023-01-27 DIAGNOSIS — E1129 Type 2 diabetes mellitus with other diabetic kidney complication: Secondary | ICD-10-CM | POA: Diagnosis not present

## 2023-01-27 DIAGNOSIS — F4321 Adjustment disorder with depressed mood: Secondary | ICD-10-CM

## 2023-01-27 NOTE — Discharge Instructions (Signed)

## 2023-01-27 NOTE — Progress Notes (Signed)
[facility] Counselor/Therapist Progress Note  Patient ID: Felicia Thompson, MRN: [id number]    Date: 01/27/23  Time Spent: 12:40  pm - 1:21 pm : 41 Minutes  Treatment Type: Individual Therapy.  Reported Symptoms: Patient reported feeling as if she is shaking "on the inside".   Mental Status Exam: Appearance:  Could not assess      Behavior: Could not assess  Motor: Could not assess  Speech/Language:  Clear and Coherent  Affect: Could not assess  Mood: normal  Thought process: normal  Thought content:   WNL  Sensory/Perceptual disturbances:   WNL  Orientation: oriented to person, place, and situation  Attention: Good  Concentration: Good  Memory: WNL  Fund of knowledge:  Good  Insight:   Fair  Judgment:  Good  Impulse Control: Good   Risk Assessment: Danger to Self:  No Patient denied current suicidal ideation Self-injurious Behavior: No Danger to Others: No Patient denied current homicidal ideation Duty to Warn:no Physical Aggression / Violence:No  Access to Firearms a concern: No  Gang Involvement:No   Subjective:  Patient reported she had cataract surgery on her left eye and is having surgery on her right eye on Wednesday. Patient reported improvement in vision since surgery. Patient reported feeling as if she is shaking "on the inside". Patient reported she is following up with her nephrologist monthly to address recent health concerns. Patient stated, "I think it's been pretty good" in response to mood since last session. Patient stated, "pretty good" in response to current mood. Patient stated, "probably not as much as I should" in response to consideration of goals since last session. Patient reported feelings of guilt related to her husband's death and reported she would like to decrease feelings of guilt. Patient reported she has some friends she is able to talk to by phone but reported she doesn't call her friends. Patient agreed to  goals/treatment plan.   Interventions: Motivational Interviewing. Clinician conducted session via telephone from clinician's home office due to patient's WebEx video and audio components not working after patient attempted to log onto WebEx. Patient provided verbal consent to proceed with telehealth session and participated in session from patient's home. Reviewed events since last session. Clinician reviewed diagnosis and treatment recommendations. Provided psycho education related to diagnosis and treatment. Clinician utilized motivational interviewing to explore potential goals for therapy. Clinician utilized a task centered approach in collaboration with patient to develop goals for therapy.   Diagnosis:  Adjustment disorder with depressed mood   Plan: Patient is to utilize [facility] [facility], thought re-framing, and coping strategies to decrease symptoms associated with their diagnosis. Frequency: bi-weekly  Modality: individual     Long-term goal:   Reduce overall level, frequency, and intensity of the feelings of depression as evidenced by decreased lack of energy, fatigue, loss of interest, lack of socialization, decreased concentration from 5 to 6  days/week to 0 to 1 days/week per patient report for at least 3 consecutive months.  Target Date: 01/28/24  Progress: 0   Short-term goal:  Decrease thoughts and feelings of guilt related to her husband's death from 5 days month per month to 0 days per month  Target Date: 07/28/23  Progress: 0   Increase patient's level of physical activity from 0 times per week to 1-2 days per week  Target Date: 07/28/23  Progress: 0   Increase participation in social opportunities from 0 days per week to 1 day per week  Target Date: 07/28/23  Progress:  0   Establish and implement a daily self care routine, such as, waking up at the same time each day, brushing patient's teeth, getting dressed, combing patient's hair.    Target Date:  07/28/23  Progress: 0              Katherina Right, LCSW

## 2023-01-28 DIAGNOSIS — I480 Paroxysmal atrial fibrillation: Secondary | ICD-10-CM | POA: Diagnosis not present

## 2023-01-28 DIAGNOSIS — D631 Anemia in chronic kidney disease: Secondary | ICD-10-CM | POA: Diagnosis not present

## 2023-01-28 DIAGNOSIS — R82998 Other abnormal findings in urine: Secondary | ICD-10-CM | POA: Diagnosis not present

## 2023-01-28 DIAGNOSIS — K769 Liver disease, unspecified: Secondary | ICD-10-CM | POA: Diagnosis not present

## 2023-01-28 DIAGNOSIS — N2589 Other disorders resulting from impaired renal tubular function: Secondary | ICD-10-CM | POA: Diagnosis not present

## 2023-01-28 DIAGNOSIS — E1129 Type 2 diabetes mellitus with other diabetic kidney complication: Secondary | ICD-10-CM | POA: Diagnosis not present

## 2023-01-28 DIAGNOSIS — N186 End stage renal disease: Secondary | ICD-10-CM | POA: Diagnosis not present

## 2023-01-28 DIAGNOSIS — N2581 Secondary hyperparathyroidism of renal origin: Secondary | ICD-10-CM | POA: Diagnosis not present

## 2023-01-28 DIAGNOSIS — Z992 Dependence on renal dialysis: Secondary | ICD-10-CM | POA: Diagnosis not present

## 2023-01-28 DIAGNOSIS — D509 Iron deficiency anemia, unspecified: Secondary | ICD-10-CM | POA: Diagnosis not present

## 2023-01-29 ENCOUNTER — Ambulatory Visit
Admission: RE | Admit: 2023-01-29 | Discharge: 2023-01-29 | Disposition: A | Discharge status: Home / Self Care | Payer: Medicare Other | Attending: Ophthalmology | Admitting: Ophthalmology

## 2023-01-29 ENCOUNTER — Encounter: Payer: Self-pay | Admitting: Ophthalmology

## 2023-01-29 ENCOUNTER — Ambulatory Visit: Payer: Medicare Other | Admitting: General Practice

## 2023-01-29 ENCOUNTER — Other Ambulatory Visit: Payer: Self-pay

## 2023-01-29 ENCOUNTER — Encounter
Admission: RE | Disposition: A | Discharge status: Home / Self Care | Payer: Self-pay | Source: Home / Self Care | Attending: Ophthalmology

## 2023-01-29 DIAGNOSIS — N2589 Other disorders resulting from impaired renal tubular function: Secondary | ICD-10-CM | POA: Diagnosis not present

## 2023-01-29 DIAGNOSIS — E1129 Type 2 diabetes mellitus with other diabetic kidney complication: Secondary | ICD-10-CM | POA: Diagnosis not present

## 2023-01-29 DIAGNOSIS — E1165 Type 2 diabetes mellitus with hyperglycemia: Secondary | ICD-10-CM | POA: Insufficient documentation

## 2023-01-29 DIAGNOSIS — D631 Anemia in chronic kidney disease: Secondary | ICD-10-CM | POA: Diagnosis not present

## 2023-01-29 DIAGNOSIS — F32A Depression, unspecified: Secondary | ICD-10-CM | POA: Insufficient documentation

## 2023-01-29 DIAGNOSIS — E1136 Type 2 diabetes mellitus with diabetic cataract: Secondary | ICD-10-CM | POA: Insufficient documentation

## 2023-01-29 DIAGNOSIS — E1122 Type 2 diabetes mellitus with diabetic chronic kidney disease: Secondary | ICD-10-CM | POA: Diagnosis not present

## 2023-01-29 DIAGNOSIS — Z794 Long term (current) use of insulin: Secondary | ICD-10-CM | POA: Insufficient documentation

## 2023-01-29 DIAGNOSIS — N189 Chronic kidney disease, unspecified: Secondary | ICD-10-CM | POA: Diagnosis not present

## 2023-01-29 DIAGNOSIS — I129 Hypertensive chronic kidney disease with stage 1 through stage 4 chronic kidney disease, or unspecified chronic kidney disease: Secondary | ICD-10-CM | POA: Insufficient documentation

## 2023-01-29 DIAGNOSIS — H2511 Age-related nuclear cataract, right eye: Secondary | ICD-10-CM | POA: Diagnosis not present

## 2023-01-29 DIAGNOSIS — G4733 Obstructive sleep apnea (adult) (pediatric): Secondary | ICD-10-CM | POA: Insufficient documentation

## 2023-01-29 DIAGNOSIS — Z992 Dependence on renal dialysis: Secondary | ICD-10-CM | POA: Diagnosis not present

## 2023-01-29 DIAGNOSIS — I120 Hypertensive chronic kidney disease with stage 5 chronic kidney disease or end stage renal disease: Secondary | ICD-10-CM | POA: Diagnosis not present

## 2023-01-29 DIAGNOSIS — K769 Liver disease, unspecified: Secondary | ICD-10-CM | POA: Diagnosis not present

## 2023-01-29 DIAGNOSIS — N186 End stage renal disease: Secondary | ICD-10-CM | POA: Diagnosis not present

## 2023-01-29 DIAGNOSIS — Z96643 Presence of artificial hip joint, bilateral: Secondary | ICD-10-CM | POA: Insufficient documentation

## 2023-01-29 DIAGNOSIS — Z96653 Presence of artificial knee joint, bilateral: Secondary | ICD-10-CM | POA: Diagnosis not present

## 2023-01-29 DIAGNOSIS — N2581 Secondary hyperparathyroidism of renal origin: Secondary | ICD-10-CM | POA: Diagnosis not present

## 2023-01-29 DIAGNOSIS — R82998 Other abnormal findings in urine: Secondary | ICD-10-CM | POA: Diagnosis not present

## 2023-01-29 DIAGNOSIS — D509 Iron deficiency anemia, unspecified: Secondary | ICD-10-CM | POA: Diagnosis not present

## 2023-01-29 HISTORY — PX: CATARACT EXTRACTION W/PHACO: SHX586

## 2023-01-29 LAB — GLUCOSE, CAPILLARY
Glucose-Capillary: 230 mg/dL — ABNORMAL HIGH (ref 70–99)
Glucose-Capillary: 174 mg/dL — ABNORMAL HIGH (ref 70–99)

## 2023-01-29 SURGERY — PHACOEMULSIFICATION, CATARACT, WITH IOL INSERTION
Anesthesia: Monitor Anesthesia Care | Site: Eye | Laterality: Right

## 2023-01-29 MED ORDER — SIGHTPATH DOSE#1 BSS IO SOLN
INTRAOCULAR | Status: DC | PRN
  Administered 2023-01-29: 1 mL via INTRAMUSCULAR

## 2023-01-29 MED ORDER — FENTANYL CITRATE (PF) 100 MCG/2ML IJ SOLN
INTRAMUSCULAR | Status: DC | PRN
  Administered 2023-01-29: 50 ug via INTRAVENOUS

## 2023-01-29 MED ORDER — MIDAZOLAM HCL 2 MG/2ML IJ SOLN
INTRAMUSCULAR | Status: DC | PRN
  Administered 2023-01-29 (×2): 1 mg via INTRAVENOUS

## 2023-01-29 MED ORDER — LACTATED RINGERS IV SOLN: INTRAVENOUS | Status: DC

## 2023-01-29 MED ORDER — INSULIN LISPRO 100 UNIT/ML IJ SOLN
5.0000 [IU] | Freq: Once | INTRAMUSCULAR | Status: AC
  Administered 2023-01-29: 5 [IU] via SUBCUTANEOUS

## 2023-01-29 MED ORDER — SIGHTPATH DOSE#1 BSS IO SOLN
INTRAOCULAR | Status: DC | PRN
  Administered 2023-01-29: 97 mL via OPHTHALMIC

## 2023-01-29 MED ORDER — CEFUROXIME OPHTHALMIC INJECTION 1 MG/0.1 ML
INJECTION | OPHTHALMIC | Status: DC | PRN
  Administered 2023-01-29: .1 mL via INTRACAMERAL

## 2023-01-29 MED ORDER — TETRACAINE HCL 0.5 % OP SOLN
1.0000 [drp] | OPHTHALMIC | Status: DC | PRN
  Administered 2023-01-29 (×3): 1 [drp] via OPHTHALMIC

## 2023-01-29 MED ORDER — BRIMONIDINE TARTRATE-TIMOLOL 0.2-0.5 % OP SOLN
OPHTHALMIC | Status: DC | PRN
  Administered 2023-01-29: 1 [drp] via OPHTHALMIC

## 2023-01-29 MED ORDER — SIGHTPATH DOSE#1 NA HYALUR & NA CHOND-NA HYALUR IO KIT
PACK | INTRAOCULAR | Status: DC | PRN
  Administered 2023-01-29: 1 via OPHTHALMIC

## 2023-01-29 MED ORDER — INSULIN ASPART 100 UNIT/ML IJ SOLN: 5.0000 [IU] | Freq: Once | INTRAMUSCULAR | Status: DC

## 2023-01-29 MED ORDER — ARMC OPHTHALMIC DILATING DROPS
1.0000 | OPHTHALMIC | Status: DC | PRN
  Administered 2023-01-29 (×3): 1 via OPHTHALMIC

## 2023-01-29 MED ORDER — SIGHTPATH DOSE#1 BSS IO SOLN
INTRAOCULAR | Status: DC | PRN
  Administered 2023-01-29: 15 mL

## 2023-01-29 SURGICAL SUPPLY — 10 items
CATARACT SUITE SIGHTPATH (MISCELLANEOUS) ×1 IMPLANT
FEE CATARACT SUITE SIGHTPATH (MISCELLANEOUS) ×1 IMPLANT
GLOVE SRG 8 PF TXTR STRL LF DI (GLOVE) ×1 IMPLANT
GLOVE SURG ENC TEXT LTX SZ7.5 (GLOVE) ×1 IMPLANT
GLOVE SURG UNDER POLY LF SZ8 (GLOVE) ×1
LENS IOL TECNIS EYHANCE 21.5 (Intraocular Lens) IMPLANT
NDL FILTER BLUNT 18X1 1/2 (NEEDLE) ×1 IMPLANT
NEEDLE FILTER BLUNT 18X1 1/2 (NEEDLE) ×1 IMPLANT
SYR 3ML LL SCALE MARK (SYRINGE) ×1 IMPLANT
WATER STERILE IRR 250ML POUR (IV SOLUTION) ×1 IMPLANT

## 2023-01-29 NOTE — Transfer of Care (Signed)
Immediate Anesthesia Transfer of Care Note  Patient: Felicia Thompson  Procedure(s) Performed: CATARACT EXTRACTION PHACO AND INTRAOCULAR LENS PLACEMENT (IOC) RIGHT DIABETIC  5.73  00:44.7 (Right: Eye)  Patient Location: PACU  Anesthesia Type: MAC  Level of Consciousness: awake, alert  and patient cooperative  Airway and Oxygen Therapy: Patient Spontanous Breathing and Patient connected to supplemental oxygen  Post-op Assessment: Post-op Vital signs reviewed, Patient's Cardiovascular Status Stable, Respiratory Function Stable, Patent Airway and No signs of Nausea or vomiting  Post-op Vital Signs: Reviewed and stable  Complications: No notable events documented.

## 2023-01-29 NOTE — Anesthesia Preprocedure Evaluation (Signed)
Anesthesia Evaluation  Patient identified by MRN, date of birth, ID band Patient awake    Reviewed: Allergy & Precautions, NPO status , Patient's Chart, lab work & pertinent test results  History of Anesthesia Complications (+) history of anesthetic complications  Airway Mallampati: III  TM Distance: <3 FB Neck ROM: full    Dental  (+) Chipped, Poor Dentition   Pulmonary shortness of breath and with exertion, sleep apnea    Pulmonary exam normal        Cardiovascular hypertension, (-) angina Normal cardiovascular exam     Neuro/Psych Seizures -, Well Controlled,  PSYCHIATRIC DISORDERS  Depression       GI/Hepatic negative GI ROS, Neg liver ROS,,,  Endo/Other  negative endocrine ROSdiabetes, Poorly Controlled    Renal/GU DialysisRenal disease     Musculoskeletal   Abdominal   Peds  Hematology negative hematology ROS (+)   Anesthesia Other Findings Past Medical History: No date: Anemia No date: Arthritis No date: Blood in stool     Comment:  when appendix burst No date: Chronic kidney disease     Comment:  Stage 4, being evaluated for transplant No date: Complication of anesthesia     Comment:  1 episode, pt was paralysed but still aware of ongoing               events No date: Depression No date: DM (diabetes mellitus), type 2 with complications (HCC) No date: Dyspnea     Comment:  with mild exertion No date: ESRD on peritoneal dialysis ([place]) No date: History of fainting spells of unknown cause No date: Hyperlipidemia No date: Hyperlipidemia associated with type 2 diabetes mellitus ([place]) No date: Hypertension No date: Motion sickness     Comment:  ocean ship No date: OSA (obstructive sleep apnea)  Past Surgical History: No date: ABDOMINAL HYSTERECTOMY No date: APPENDECTOMY 01/15/2023: CATARACT EXTRACTION W/PHACO; Left     Comment:  Procedure: CATARACT EXTRACTION PHACO AND INTRAOCULAR                LENS PLACEMENT ([place]) LEFT DIABETIC  5.36  00:35.6;                Surgeon: Leandrew Koyanagi, MD;  Location: [facility];  Service: Ophthalmology;  Laterality: Left;              Diabetic No date: CHOLECYSTECTOMY No date: REPLACEMENT TOTAL KNEE BILATERAL; Bilateral No date: TONSILECTOMY, ADENOIDECTOMY, BILATERAL MYRINGOTOMY AND TUBES No date: TOTAL HIP ARTHROPLASTY; Bilateral  BMI    Body Mass Index: 25.84 kg/m      Reproductive/Obstetrics negative OB ROS                             Anesthesia Physical Anesthesia Plan  ASA: 4  Anesthesia Plan: MAC   Post-op Pain Management:    Induction: Intravenous  PONV Risk Score and Plan:   Airway Management Planned: Natural Airway and Nasal Cannula  Additional Equipment:   Intra-op Plan:   Post-operative Plan:   Informed Consent: I have reviewed the patients History and Physical, chart, labs and discussed the procedure including the risks, benefits and alternatives for the proposed anesthesia with the patient or authorized representative who has indicated his/her understanding and acceptance.     Dental Advisory Given  Plan Discussed with: Anesthesiologist, CRNA and Surgeon  Anesthesia Plan Comments: (Patient consented for  risks of anesthesia including but not limited to:  - adverse reactions to medications - damage to eyes, teeth, lips or other oral mucosa - nerve damage due to positioning  - sore throat or hoarseness - Damage to heart, brain, nerves, lungs, other parts of body or loss of life  Patient voiced understanding.)       Anesthesia Quick Evaluation

## 2023-01-29 NOTE — Addendum Note (Signed)
Addendum  created 01/29/23 0826 by Kristel Durkee, Precious Haws, MD   Attestation recorded in [place], [place] filed

## 2023-01-29 NOTE — Anesthesia Postprocedure Evaluation (Signed)
Anesthesia Post Note  Patient: Felicia Thompson  Procedure(s) Performed: CATARACT EXTRACTION PHACO AND INTRAOCULAR LENS PLACEMENT ([facility]) RIGHT DIABETIC  5.73  00:44.7 (Right: Eye)  Patient location during evaluation: PACU Anesthesia Type: MAC Level of consciousness: awake and alert Pain management: pain level controlled Vital Signs Assessment: post-procedure vital signs reviewed and stable Respiratory status: spontaneous breathing, nonlabored ventilation, respiratory function stable and patient connected to nasal cannula oxygen Cardiovascular status: blood pressure returned to baseline and stable Postop Assessment: no apparent nausea or vomiting Anesthetic complications: no   No notable events documented.   Last Vitals:  Vitals:   01/29/23 0752 01/29/23 0800  BP: 116/61 123/66  Pulse: (!) 103 95  Resp: 14 15  Temp: (!) 36.2 C (!) 36.2 C  SpO2: 97% 97%    Last Pain:  Vitals:   01/29/23 0800  TempSrc:   PainSc: 0-No pain                 Precious Haws Conroy Goracke

## 2023-01-29 NOTE — Op Note (Signed)
  LOCATION:  [facility]   PREOPERATIVE DIAGNOSIS:    Nuclear sclerotic cataract right eye. H25.11   POSTOPERATIVE DIAGNOSIS:  Nuclear sclerotic cataract right eye.     PROCEDURE:  Phacoemusification with posterior chamber intraocular lens placement of the right eye   ULTRASOUND TIME: Procedure(s) with comments: CATARACT EXTRACTION PHACO AND INTRAOCULAR LENS PLACEMENT ([facility]) RIGHT DIABETIC  5.73  00:44.7 (Right) - Diabetic  LENS:   Implant Name Type Inv. Item Serial No. Manufacturer Lot No. LRB No. Used Action  LENS IOL TECNIS EYHANCE 21.5 - [id number] Intraocular Lens LENS IOL TECNIS EYHANCE 21.5 [id number] [employer]  Right 1 Implanted         SURGEON:  Wyonia Hough, MD   ANESTHESIA:  Topical with tetracaine drops and 2% Xylocaine jelly, augmented with 1% preservative-free intracameral lidocaine.    COMPLICATIONS:  None.   DESCRIPTION OF PROCEDURE:  The patient was identified in the holding room and transported to the operating room and placed in the supine position under the operating microscope.  The right eye was identified as the operative eye and it was prepped and draped in the usual sterile ophthalmic fashion.   A 1 millimeter clear-corneal paracentesis was made at the 12:00 position.  0.5 ml of preservative-free 1% lidocaine was injected into the anterior chamber. The anterior chamber was filled with Viscoat viscoelastic.  A 2.4 millimeter keratome was used to make a near-clear corneal incision at the 9:00 position.  A curvilinear capsulorrhexis was made with a cystotome and capsulorrhexis forceps.  Balanced salt solution was used to hydrodissect and hydrodelineate the nucleus.   Phacoemulsification was then used in stop and chop fashion to remove the lens nucleus and epinucleus.  The remaining cortex was then removed using the irrigation and aspiration handpiece. Provisc was then placed into the capsular bag to distend it for lens placement.  A lens was then  injected into the capsular bag.  The remaining viscoelastic was aspirated.   Wounds were hydrated with balanced salt solution.  The anterior chamber was inflated to a physiologic pressure with balanced salt solution.  No wound leaks were noted. Cefuroxime 0.1 ml of a 77m/ml solution was injected into the anterior chamber for a dose of 1 mg of intracameral antibiotic at the completion of the case.   Timolol and Brimonidine drops were applied to the eye.  The patient was taken to the recovery room in stable condition without complications of anesthesia or surgery.   Katriona Schmierer 01/29/2023, 7:51 AM

## 2023-01-29 NOTE — H&P (Signed)
[facility]   Primary Care Physician:  Isaac Bliss, Rayford Halsted, MD Ophthalmologist: Dr. Leandrew Koyanagi  Pre-Procedure History & Physical: HPI:  Felicia Thompson is a 77 y.o. female here for ophthalmic surgery.   Past Medical History:  Diagnosis Date   Anemia    Arthritis    Blood in stool    when appendix burst   Chronic kidney disease    Stage 4, being evaluated for transplant   Complication of anesthesia    1 episode, pt was paralysed but still aware of ongoing events   Depression    DM (diabetes mellitus), type 2 with complications (HCC)    Dyspnea    with mild exertion   ESRD on peritoneal dialysis ([place])    History of fainting spells of unknown cause    Hyperlipidemia    Hyperlipidemia associated with type 2 diabetes mellitus ([place])    Hypertension    Motion sickness    ocean ship   OSA (obstructive sleep apnea)     Past Surgical History:  Procedure Laterality Date   ABDOMINAL HYSTERECTOMY     APPENDECTOMY     CATARACT EXTRACTION W/PHACO Left 01/15/2023   Procedure: CATARACT EXTRACTION PHACO AND INTRAOCULAR LENS PLACEMENT ([place]) LEFT DIABETIC  5.36  00:35.6;  Surgeon: Leandrew Koyanagi, MD;  Location: [facility];  Service: Ophthalmology;  Laterality: Left;  Diabetic   CHOLECYSTECTOMY     REPLACEMENT TOTAL KNEE BILATERAL Bilateral    TONSILECTOMY, ADENOIDECTOMY, BILATERAL MYRINGOTOMY AND TUBES     TOTAL HIP ARTHROPLASTY Bilateral     Prior to Admission medications   Medication Sig Start Date End Date Taking? Authorizing Provider  amLODipine (NORVASC) 5 MG tablet Take 5 mg by mouth daily.   Yes [provider]  aspirin 81 MG chewable tablet Chew 81 mg by mouth daily.   Yes [provider]  cloNIDine (CATAPRES) 0.1 MG tablet Take 0.1 mg by mouth 2 (two) times daily.   Yes [provider]  diphenhydramine-acetaminophen (TYLENOL PM) 25-500 MG TABS tablet Take 1 tablet by mouth at bedtime as needed.   Yes  [provider]  insulin NPH-regular Human (HUMULIN 70/30) (70-30) 100 UNIT/ML injection Inject 10 Units into the skin daily with breakfast. Patient taking differently: Inject 10 Units into the skin at bedtime. 08/17/21  Yes Renato Shin, MD  losartan (COZAAR) 50 MG tablet Take 1 tablet (50 mg total) by mouth daily. Patient taking differently: Take 50 mg by mouth 2 (two) times daily. 10/12/21  Yes Lora Havens, MD  Multiple Vitamins-Minerals (RENAPLEX) TABS Take 1 tablet by mouth daily. 08/30/21  Yes [provider]  ondansetron (ZOFRAN) 4 MG tablet Take 1 tablet (4 mg total) by mouth every 6 (six) hours as needed for nausea. 11/13/22  Yes Sidney Ace, MD  Probiotic Product ([employer]S ADV PROBIOTIC GUMMIES PO) Take 1 tablet by mouth at bedtime.   Yes [provider]  sevelamer carbonate (RENVELA) 800 MG tablet Take 2,400 mg by mouth 3 (three) times daily with meals.   Yes [provider]  venlafaxine XR (EFFEXOR-XR) 150 MG 24 hr capsule Take 1 capsule (150 mg total) by mouth daily with breakfast. 03/26/22  Yes Isaac Bliss, Rayford Halsted, MD    Allergies as of 09/18/2022 - Review Complete 08/26/2022  Allergen Reaction Noted   Tape Other (See Comments) 05/15/2021   Tetracyclines & related Rash 05/15/2021    Family History  Problem Relation Age of Onset   Hearing loss Father  Hyperlipidemia Father    Heart disease Father    Early death Father    Cancer Father    Early death Brother    Drug abuse Brother    Diabetes Brother    Depression Brother    Alcohol abuse Brother    Miscarriages / [place] Daughter    Arthritis Daughter    Depression Daughter    Early death Maternal Grandmother    Heart attack Maternal Grandmother    Heart disease Maternal Grandmother    Hearing loss Paternal Grandmother    Heart disease Paternal Grandmother    Heart disease Paternal Grandfather    Early death Paternal Grandfather    Heart attack Paternal  Grandfather     Social History   Socioeconomic History   Marital status: Widowed    Spouse name: Not on file   Number of children: 4   Years of education: Not on file   Highest education level: Master's degree (e.g., MA, MS, MEng, MEd, MSW, MBA)  Occupational History   Not on file  Tobacco Use   Smoking status: Never   Smokeless tobacco: Never  Vaping Use   Vaping Use: Never used  Substance and Sexual Activity   Alcohol use: Never   Drug use: Never   Sexual activity: Not Currently  Other Topics Concern   Not on file  Social History Narrative   Lives with daughter and son in law and 2 children   Right handed    Caffeine: 3 cup a week   Social Determinants of Health   Financial Resource Strain: Low Risk  (08/22/2022)   Overall Financial Resource Strain (CARDIA)    Difficulty of Paying Living Expenses: Not hard at all  Food Insecurity: No Food Insecurity (11/14/2022)   Hunger Vital Sign    Worried About Running Out of Food in the Last Year: Never true    [place] in the Last Year: Never true  Transportation Needs: Unmet Transportation Needs (11/14/2022)   PRAPARE - Transportation    Lack of Transportation (Medical): Yes    Lack of Transportation (Non-Medical): Yes  Physical Activity: Unknown (10/04/2021)   Exercise Vital Sign    Days of Exercise per Week: 0 days    Minutes of Exercise per Session: Not on file  Stress: No Stress Concern Present (10/04/2021)   [place] of Stress : Not at all  Social Connections: Moderately Isolated (10/04/2021)   Social Connection and Isolation Panel [NHANES]    Frequency of Communication with Friends and Family: More than three times a week    Frequency of Social Gatherings with Friends and Family: Never    Attends Religious Services: 1 to 4 times per year    Active Member of [employer] or Organizations: No    Attends Archivist Meetings: Not on  file    Marital Status: Widowed  Human resources officer Violence: Not on file    Review of Systems: See HPI, otherwise negative ROS  Physical Exam: BP 132/69   Pulse (!) 104   Temp 97.9 F (36.6 C) (Temporal)   Resp 14   Ht '5\' 7"'$  (1.702 m)   Wt 74.8 kg   SpO2 98%   BMI 25.84 kg/m  General:   Alert,  pleasant and cooperative in NAD Head:  Normocephalic and atraumatic. Lungs:  Clear to auscultation.    Heart:  Regular rate and rhythm.   Impression/Plan: [facility]  is here for ophthalmic surgery.  Risks, benefits, limitations, and alternatives regarding ophthalmic surgery have been reviewed with the patient.  Questions have been answered.  All parties agreeable.   Leandrew Koyanagi, MD  01/29/2023, 7:28 AM

## 2023-01-30 ENCOUNTER — Encounter: Payer: Self-pay | Admitting: Ophthalmology

## 2023-01-30 DIAGNOSIS — Z992 Dependence on renal dialysis: Secondary | ICD-10-CM | POA: Diagnosis not present

## 2023-01-30 DIAGNOSIS — G4733 Obstructive sleep apnea (adult) (pediatric): Secondary | ICD-10-CM | POA: Diagnosis not present

## 2023-01-30 DIAGNOSIS — N2581 Secondary hyperparathyroidism of renal origin: Secondary | ICD-10-CM | POA: Diagnosis not present

## 2023-01-30 DIAGNOSIS — R82998 Other abnormal findings in urine: Secondary | ICD-10-CM | POA: Diagnosis not present

## 2023-01-30 DIAGNOSIS — E1129 Type 2 diabetes mellitus with other diabetic kidney complication: Secondary | ICD-10-CM | POA: Diagnosis not present

## 2023-01-30 DIAGNOSIS — N186 End stage renal disease: Secondary | ICD-10-CM | POA: Diagnosis not present

## 2023-01-30 DIAGNOSIS — N2589 Other disorders resulting from impaired renal tubular function: Secondary | ICD-10-CM | POA: Diagnosis not present

## 2023-01-30 DIAGNOSIS — D509 Iron deficiency anemia, unspecified: Secondary | ICD-10-CM | POA: Diagnosis not present

## 2023-01-30 DIAGNOSIS — K769 Liver disease, unspecified: Secondary | ICD-10-CM | POA: Diagnosis not present

## 2023-01-30 DIAGNOSIS — D631 Anemia in chronic kidney disease: Secondary | ICD-10-CM | POA: Diagnosis not present

## 2023-01-31 DIAGNOSIS — E1129 Type 2 diabetes mellitus with other diabetic kidney complication: Secondary | ICD-10-CM | POA: Diagnosis not present

## 2023-01-31 DIAGNOSIS — D509 Iron deficiency anemia, unspecified: Secondary | ICD-10-CM | POA: Diagnosis not present

## 2023-01-31 DIAGNOSIS — Z992 Dependence on renal dialysis: Secondary | ICD-10-CM | POA: Diagnosis not present

## 2023-01-31 DIAGNOSIS — D631 Anemia in chronic kidney disease: Secondary | ICD-10-CM | POA: Diagnosis not present

## 2023-01-31 DIAGNOSIS — N186 End stage renal disease: Secondary | ICD-10-CM | POA: Diagnosis not present

## 2023-01-31 DIAGNOSIS — N2589 Other disorders resulting from impaired renal tubular function: Secondary | ICD-10-CM | POA: Diagnosis not present

## 2023-01-31 DIAGNOSIS — N2581 Secondary hyperparathyroidism of renal origin: Secondary | ICD-10-CM | POA: Diagnosis not present

## 2023-01-31 DIAGNOSIS — R82998 Other abnormal findings in urine: Secondary | ICD-10-CM | POA: Diagnosis not present

## 2023-01-31 DIAGNOSIS — K769 Liver disease, unspecified: Secondary | ICD-10-CM | POA: Diagnosis not present

## 2023-02-01 DIAGNOSIS — K769 Liver disease, unspecified: Secondary | ICD-10-CM | POA: Diagnosis not present

## 2023-02-01 DIAGNOSIS — N2581 Secondary hyperparathyroidism of renal origin: Secondary | ICD-10-CM | POA: Diagnosis not present

## 2023-02-01 DIAGNOSIS — N186 End stage renal disease: Secondary | ICD-10-CM | POA: Diagnosis not present

## 2023-02-01 DIAGNOSIS — Z992 Dependence on renal dialysis: Secondary | ICD-10-CM | POA: Diagnosis not present

## 2023-02-01 DIAGNOSIS — R82998 Other abnormal findings in urine: Secondary | ICD-10-CM | POA: Diagnosis not present

## 2023-02-01 DIAGNOSIS — E1129 Type 2 diabetes mellitus with other diabetic kidney complication: Secondary | ICD-10-CM | POA: Diagnosis not present

## 2023-02-01 DIAGNOSIS — N2589 Other disorders resulting from impaired renal tubular function: Secondary | ICD-10-CM | POA: Diagnosis not present

## 2023-02-01 DIAGNOSIS — D631 Anemia in chronic kidney disease: Secondary | ICD-10-CM | POA: Diagnosis not present

## 2023-02-01 DIAGNOSIS — D509 Iron deficiency anemia, unspecified: Secondary | ICD-10-CM | POA: Diagnosis not present

## 2023-02-02 DIAGNOSIS — E1129 Type 2 diabetes mellitus with other diabetic kidney complication: Secondary | ICD-10-CM | POA: Diagnosis not present

## 2023-02-02 DIAGNOSIS — D631 Anemia in chronic kidney disease: Secondary | ICD-10-CM | POA: Diagnosis not present

## 2023-02-02 DIAGNOSIS — Z992 Dependence on renal dialysis: Secondary | ICD-10-CM | POA: Diagnosis not present

## 2023-02-02 DIAGNOSIS — N2581 Secondary hyperparathyroidism of renal origin: Secondary | ICD-10-CM | POA: Diagnosis not present

## 2023-02-02 DIAGNOSIS — R82998 Other abnormal findings in urine: Secondary | ICD-10-CM | POA: Diagnosis not present

## 2023-02-02 DIAGNOSIS — N186 End stage renal disease: Secondary | ICD-10-CM | POA: Diagnosis not present

## 2023-02-02 DIAGNOSIS — N2589 Other disorders resulting from impaired renal tubular function: Secondary | ICD-10-CM | POA: Diagnosis not present

## 2023-02-02 DIAGNOSIS — K769 Liver disease, unspecified: Secondary | ICD-10-CM | POA: Diagnosis not present

## 2023-02-02 DIAGNOSIS — D509 Iron deficiency anemia, unspecified: Secondary | ICD-10-CM | POA: Diagnosis not present

## 2023-02-03 DIAGNOSIS — D509 Iron deficiency anemia, unspecified: Secondary | ICD-10-CM | POA: Diagnosis not present

## 2023-02-03 DIAGNOSIS — E1129 Type 2 diabetes mellitus with other diabetic kidney complication: Secondary | ICD-10-CM | POA: Diagnosis not present

## 2023-02-03 DIAGNOSIS — N2589 Other disorders resulting from impaired renal tubular function: Secondary | ICD-10-CM | POA: Diagnosis not present

## 2023-02-03 DIAGNOSIS — N2581 Secondary hyperparathyroidism of renal origin: Secondary | ICD-10-CM | POA: Diagnosis not present

## 2023-02-03 DIAGNOSIS — Z992 Dependence on renal dialysis: Secondary | ICD-10-CM | POA: Diagnosis not present

## 2023-02-03 DIAGNOSIS — D631 Anemia in chronic kidney disease: Secondary | ICD-10-CM | POA: Diagnosis not present

## 2023-02-03 DIAGNOSIS — N186 End stage renal disease: Secondary | ICD-10-CM | POA: Diagnosis not present

## 2023-02-03 DIAGNOSIS — K769 Liver disease, unspecified: Secondary | ICD-10-CM | POA: Diagnosis not present

## 2023-02-03 DIAGNOSIS — R82998 Other abnormal findings in urine: Secondary | ICD-10-CM | POA: Diagnosis not present

## 2023-02-04 DIAGNOSIS — K769 Liver disease, unspecified: Secondary | ICD-10-CM | POA: Diagnosis not present

## 2023-02-04 DIAGNOSIS — N186 End stage renal disease: Secondary | ICD-10-CM | POA: Diagnosis not present

## 2023-02-04 DIAGNOSIS — R82998 Other abnormal findings in urine: Secondary | ICD-10-CM | POA: Diagnosis not present

## 2023-02-04 DIAGNOSIS — D509 Iron deficiency anemia, unspecified: Secondary | ICD-10-CM | POA: Diagnosis not present

## 2023-02-04 DIAGNOSIS — E1129 Type 2 diabetes mellitus with other diabetic kidney complication: Secondary | ICD-10-CM | POA: Diagnosis not present

## 2023-02-04 DIAGNOSIS — Z992 Dependence on renal dialysis: Secondary | ICD-10-CM | POA: Diagnosis not present

## 2023-02-04 DIAGNOSIS — D631 Anemia in chronic kidney disease: Secondary | ICD-10-CM | POA: Diagnosis not present

## 2023-02-04 DIAGNOSIS — N2581 Secondary hyperparathyroidism of renal origin: Secondary | ICD-10-CM | POA: Diagnosis not present

## 2023-02-04 DIAGNOSIS — N2589 Other disorders resulting from impaired renal tubular function: Secondary | ICD-10-CM | POA: Diagnosis not present

## 2023-02-05 DIAGNOSIS — N2589 Other disorders resulting from impaired renal tubular function: Secondary | ICD-10-CM | POA: Diagnosis not present

## 2023-02-05 DIAGNOSIS — D509 Iron deficiency anemia, unspecified: Secondary | ICD-10-CM | POA: Diagnosis not present

## 2023-02-05 DIAGNOSIS — R82998 Other abnormal findings in urine: Secondary | ICD-10-CM | POA: Diagnosis not present

## 2023-02-05 DIAGNOSIS — K769 Liver disease, unspecified: Secondary | ICD-10-CM | POA: Diagnosis not present

## 2023-02-05 DIAGNOSIS — N186 End stage renal disease: Secondary | ICD-10-CM | POA: Diagnosis not present

## 2023-02-05 DIAGNOSIS — D631 Anemia in chronic kidney disease: Secondary | ICD-10-CM | POA: Diagnosis not present

## 2023-02-05 DIAGNOSIS — N2581 Secondary hyperparathyroidism of renal origin: Secondary | ICD-10-CM | POA: Diagnosis not present

## 2023-02-05 DIAGNOSIS — Z992 Dependence on renal dialysis: Secondary | ICD-10-CM | POA: Diagnosis not present

## 2023-02-05 DIAGNOSIS — E1129 Type 2 diabetes mellitus with other diabetic kidney complication: Secondary | ICD-10-CM | POA: Diagnosis not present

## 2023-02-06 DIAGNOSIS — D631 Anemia in chronic kidney disease: Secondary | ICD-10-CM | POA: Diagnosis not present

## 2023-02-06 DIAGNOSIS — N186 End stage renal disease: Secondary | ICD-10-CM | POA: Diagnosis not present

## 2023-02-06 DIAGNOSIS — E1129 Type 2 diabetes mellitus with other diabetic kidney complication: Secondary | ICD-10-CM | POA: Diagnosis not present

## 2023-02-06 DIAGNOSIS — Z992 Dependence on renal dialysis: Secondary | ICD-10-CM | POA: Diagnosis not present

## 2023-02-06 DIAGNOSIS — R82998 Other abnormal findings in urine: Secondary | ICD-10-CM | POA: Diagnosis not present

## 2023-02-06 DIAGNOSIS — K769 Liver disease, unspecified: Secondary | ICD-10-CM | POA: Diagnosis not present

## 2023-02-06 DIAGNOSIS — N2589 Other disorders resulting from impaired renal tubular function: Secondary | ICD-10-CM | POA: Diagnosis not present

## 2023-02-06 DIAGNOSIS — D509 Iron deficiency anemia, unspecified: Secondary | ICD-10-CM | POA: Diagnosis not present

## 2023-02-06 DIAGNOSIS — N2581 Secondary hyperparathyroidism of renal origin: Secondary | ICD-10-CM | POA: Diagnosis not present

## 2023-02-07 DIAGNOSIS — Z992 Dependence on renal dialysis: Secondary | ICD-10-CM | POA: Diagnosis not present

## 2023-02-07 DIAGNOSIS — N2589 Other disorders resulting from impaired renal tubular function: Secondary | ICD-10-CM | POA: Diagnosis not present

## 2023-02-07 DIAGNOSIS — N2581 Secondary hyperparathyroidism of renal origin: Secondary | ICD-10-CM | POA: Diagnosis not present

## 2023-02-07 DIAGNOSIS — R82998 Other abnormal findings in urine: Secondary | ICD-10-CM | POA: Diagnosis not present

## 2023-02-07 DIAGNOSIS — K769 Liver disease, unspecified: Secondary | ICD-10-CM | POA: Diagnosis not present

## 2023-02-07 DIAGNOSIS — E1129 Type 2 diabetes mellitus with other diabetic kidney complication: Secondary | ICD-10-CM | POA: Diagnosis not present

## 2023-02-07 DIAGNOSIS — D631 Anemia in chronic kidney disease: Secondary | ICD-10-CM | POA: Diagnosis not present

## 2023-02-07 DIAGNOSIS — N186 End stage renal disease: Secondary | ICD-10-CM | POA: Diagnosis not present

## 2023-02-07 DIAGNOSIS — D509 Iron deficiency anemia, unspecified: Secondary | ICD-10-CM | POA: Diagnosis not present

## 2023-02-08 DIAGNOSIS — N2581 Secondary hyperparathyroidism of renal origin: Secondary | ICD-10-CM | POA: Diagnosis not present

## 2023-02-08 DIAGNOSIS — D509 Iron deficiency anemia, unspecified: Secondary | ICD-10-CM | POA: Diagnosis not present

## 2023-02-08 DIAGNOSIS — N186 End stage renal disease: Secondary | ICD-10-CM | POA: Diagnosis not present

## 2023-02-08 DIAGNOSIS — Z992 Dependence on renal dialysis: Secondary | ICD-10-CM | POA: Diagnosis not present

## 2023-02-08 DIAGNOSIS — R82998 Other abnormal findings in urine: Secondary | ICD-10-CM | POA: Diagnosis not present

## 2023-02-08 DIAGNOSIS — E1129 Type 2 diabetes mellitus with other diabetic kidney complication: Secondary | ICD-10-CM | POA: Diagnosis not present

## 2023-02-08 DIAGNOSIS — D631 Anemia in chronic kidney disease: Secondary | ICD-10-CM | POA: Diagnosis not present

## 2023-02-08 DIAGNOSIS — N2589 Other disorders resulting from impaired renal tubular function: Secondary | ICD-10-CM | POA: Diagnosis not present

## 2023-02-08 DIAGNOSIS — K769 Liver disease, unspecified: Secondary | ICD-10-CM | POA: Diagnosis not present

## 2023-02-09 DIAGNOSIS — E1129 Type 2 diabetes mellitus with other diabetic kidney complication: Secondary | ICD-10-CM | POA: Diagnosis not present

## 2023-02-09 DIAGNOSIS — D631 Anemia in chronic kidney disease: Secondary | ICD-10-CM | POA: Diagnosis not present

## 2023-02-09 DIAGNOSIS — K769 Liver disease, unspecified: Secondary | ICD-10-CM | POA: Diagnosis not present

## 2023-02-09 DIAGNOSIS — N186 End stage renal disease: Secondary | ICD-10-CM | POA: Diagnosis not present

## 2023-02-09 DIAGNOSIS — N2589 Other disorders resulting from impaired renal tubular function: Secondary | ICD-10-CM | POA: Diagnosis not present

## 2023-02-09 DIAGNOSIS — R82998 Other abnormal findings in urine: Secondary | ICD-10-CM | POA: Diagnosis not present

## 2023-02-09 DIAGNOSIS — N2581 Secondary hyperparathyroidism of renal origin: Secondary | ICD-10-CM | POA: Diagnosis not present

## 2023-02-09 DIAGNOSIS — Z992 Dependence on renal dialysis: Secondary | ICD-10-CM | POA: Diagnosis not present

## 2023-02-09 DIAGNOSIS — D509 Iron deficiency anemia, unspecified: Secondary | ICD-10-CM | POA: Diagnosis not present

## 2023-02-10 DIAGNOSIS — N2581 Secondary hyperparathyroidism of renal origin: Secondary | ICD-10-CM | POA: Diagnosis not present

## 2023-02-10 DIAGNOSIS — N186 End stage renal disease: Secondary | ICD-10-CM | POA: Diagnosis not present

## 2023-02-10 DIAGNOSIS — Z992 Dependence on renal dialysis: Secondary | ICD-10-CM | POA: Diagnosis not present

## 2023-02-10 DIAGNOSIS — D631 Anemia in chronic kidney disease: Secondary | ICD-10-CM | POA: Diagnosis not present

## 2023-02-10 DIAGNOSIS — E1129 Type 2 diabetes mellitus with other diabetic kidney complication: Secondary | ICD-10-CM | POA: Diagnosis not present

## 2023-02-10 DIAGNOSIS — K769 Liver disease, unspecified: Secondary | ICD-10-CM | POA: Diagnosis not present

## 2023-02-10 DIAGNOSIS — N2589 Other disorders resulting from impaired renal tubular function: Secondary | ICD-10-CM | POA: Diagnosis not present

## 2023-02-10 DIAGNOSIS — R82998 Other abnormal findings in urine: Secondary | ICD-10-CM | POA: Diagnosis not present

## 2023-02-10 DIAGNOSIS — D509 Iron deficiency anemia, unspecified: Secondary | ICD-10-CM | POA: Diagnosis not present

## 2023-02-11 ENCOUNTER — Telehealth: Payer: Self-pay | Admitting: Licensed Clinical Social Worker

## 2023-02-11 DIAGNOSIS — N186 End stage renal disease: Secondary | ICD-10-CM | POA: Diagnosis not present

## 2023-02-11 DIAGNOSIS — D509 Iron deficiency anemia, unspecified: Secondary | ICD-10-CM | POA: Diagnosis not present

## 2023-02-11 DIAGNOSIS — Z992 Dependence on renal dialysis: Secondary | ICD-10-CM | POA: Diagnosis not present

## 2023-02-11 DIAGNOSIS — D631 Anemia in chronic kidney disease: Secondary | ICD-10-CM | POA: Diagnosis not present

## 2023-02-11 DIAGNOSIS — K769 Liver disease, unspecified: Secondary | ICD-10-CM | POA: Diagnosis not present

## 2023-02-11 DIAGNOSIS — E1165 Type 2 diabetes mellitus with hyperglycemia: Secondary | ICD-10-CM

## 2023-02-11 DIAGNOSIS — N2581 Secondary hyperparathyroidism of renal origin: Secondary | ICD-10-CM | POA: Diagnosis not present

## 2023-02-11 DIAGNOSIS — N2589 Other disorders resulting from impaired renal tubular function: Secondary | ICD-10-CM | POA: Diagnosis not present

## 2023-02-11 DIAGNOSIS — E1129 Type 2 diabetes mellitus with other diabetic kidney complication: Secondary | ICD-10-CM | POA: Diagnosis not present

## 2023-02-11 DIAGNOSIS — R82998 Other abnormal findings in urine: Secondary | ICD-10-CM | POA: Diagnosis not present

## 2023-02-11 DIAGNOSIS — I10 Essential (primary) hypertension: Secondary | ICD-10-CM

## 2023-02-11 NOTE — Patient Instructions (Signed)
Visit Information  Thank you for taking time to visit with me today. Please don't hesitate to contact me if I can be of assistance to you.   Following are the goals we discussed today:   Goals Addressed             This Visit's Progress    LCSW Plan of Care-Obtain Supportive Resources   On track    Activities and task to complete in order to accomplish goals.   Keep all upcoming appointments discussed today Continue with compliance of taking medication prescribed by Doctor Continue working with [facility] care team to assist with goals identified I have placed a referral to the community resource care guide to assist with resources for transportation needs         Our next appointment is by telephone on 3/25 at 1 PM  Please call the care guide team at [phone] if you need to cancel or reschedule your appointment.   If you are experiencing a Mental Health or [place] or need someone to talk to, please call the Suicide and Crisis Lifeline: 988 call 911   Patient verbalizes understanding of instructions and care plan provided today and agrees to view in [facility]. Active MyChart status and patient understanding of how to access instructions and care plan via MyChart confirmed with patient.     Christa See, MSW, [email or web address] Phone [phone] 5:31 PM

## 2023-02-11 NOTE — Patient Outreach (Signed)
  Care Coordination   Follow Up Visit Note   02/11/2023 Name: Felicia Thompson MRN: [id number] DOB: Jun 16, 1946  Felicia Thompson is a 77 y.o. year old female who sees Isaac Bliss, Rayford Halsted, MD for primary care. I spoke with  Felicia Thompson by phone today.  What matters to the patients health and wellness today?  Transportation resources    Goals Addressed             This Visit's Progress    LCSW Plan of Care-Obtain Supportive Resources   On track    Activities and task to complete in order to accomplish goals.   Keep all upcoming appointments discussed today Continue with compliance of taking medication prescribed by Doctor Continue working with [facility] care team to assist with goals identified I have placed a referral to the community resource care guide to assist with resources for transportation needs         SDOH assessments and interventions completed:  Yes  SDOH Interventions Today    Flowsheet Row Most Recent Value  SDOH Interventions   Food Insecurity Interventions Intervention Not Indicated  Housing Interventions Intervention Not Indicated  Transportation Interventions Patient Resources (Friends/Family), Other (Comment), AMB Referral  [Pt receives transportation from family. Has been approved for [facility] in [place]. [employer] insurance will not provide transportation to appts over 50 miles, need assistance]        Care Coordination Interventions:  Yes, provided  Interventions Today    Flowsheet Row Most Recent Value  Chronic Disease   Chronic disease during today's visit Hypertension (HTN), Diabetes, Other  [hx of seizures]  General Interventions   General Interventions Discussed/Reviewed General Interventions Reviewed, [employer], Communication with  [employer] reviewed upcoming appts. Pt's insurance will not assist with transportation to appts over 50 miles roundtrip. Pt receives strong support from family]  Communication with --   [LCSW will complete referral to care guide for resources]  Education Interventions   Education Provided Provided Education  Provided Verbal Education On [employer]  resources]       Follow up plan: Follow up call scheduled for 1-2 weeks    Encounter Outcome:  Pt. Visit Completed   Christa See, MSW, [email or web address] Phone [phone] 5:29 PM

## 2023-02-12 ENCOUNTER — Telehealth: Payer: Self-pay

## 2023-02-12 DIAGNOSIS — N2581 Secondary hyperparathyroidism of renal origin: Secondary | ICD-10-CM | POA: Diagnosis not present

## 2023-02-12 DIAGNOSIS — D631 Anemia in chronic kidney disease: Secondary | ICD-10-CM | POA: Diagnosis not present

## 2023-02-12 DIAGNOSIS — K769 Liver disease, unspecified: Secondary | ICD-10-CM | POA: Diagnosis not present

## 2023-02-12 DIAGNOSIS — D509 Iron deficiency anemia, unspecified: Secondary | ICD-10-CM | POA: Diagnosis not present

## 2023-02-12 DIAGNOSIS — E1129 Type 2 diabetes mellitus with other diabetic kidney complication: Secondary | ICD-10-CM | POA: Diagnosis not present

## 2023-02-12 DIAGNOSIS — R82998 Other abnormal findings in urine: Secondary | ICD-10-CM | POA: Diagnosis not present

## 2023-02-12 DIAGNOSIS — N2589 Other disorders resulting from impaired renal tubular function: Secondary | ICD-10-CM | POA: Diagnosis not present

## 2023-02-12 DIAGNOSIS — Z992 Dependence on renal dialysis: Secondary | ICD-10-CM | POA: Diagnosis not present

## 2023-02-12 DIAGNOSIS — N186 End stage renal disease: Secondary | ICD-10-CM | POA: Diagnosis not present

## 2023-02-12 NOTE — Telephone Encounter (Signed)
   Telephone encounter was:  Successful.  02/12/2023 Name: Felicia Thompson MRN: [id number] DOB: 18-Mar-1946  Felicia Thompson is a 77 y.o. year old female who is a primary care patient of Isaac Bliss, Rayford Halsted, MD . The community resource team was consulted for assistance with Transportation Needs   Care guide performed the following interventions: Spoke with patient about [employer] transportation which only travels [employer].  Patient has completed application for [employer]. Spoke with Sharyn Lull at [facility] to setup local transportation. They will mail application to filled out and sent back. [employer] will not work for patient since it only travels to [place] twice a day 5:30am and 11am.  Sharyn Lull at [employer] suggested patient contact [employer] located in [place] [phone]. Patient stated that her family is able to provide transportation to her appointments.  Follow Up Plan:  No further follow up planned at this time. The patient has been provided with needed resources.  [place] Resource Care Guide   [email or web address]  ?? [phone]   Website: [email or web address]  [email or web address]

## 2023-02-13 DIAGNOSIS — Z7984 Long term (current) use of oral hypoglycemic drugs: Secondary | ICD-10-CM | POA: Diagnosis not present

## 2023-02-13 DIAGNOSIS — I3481 Nonrheumatic mitral (valve) annulus calcification: Secondary | ICD-10-CM | POA: Diagnosis not present

## 2023-02-13 DIAGNOSIS — E1122 Type 2 diabetes mellitus with diabetic chronic kidney disease: Secondary | ICD-10-CM | POA: Diagnosis not present

## 2023-02-13 DIAGNOSIS — I471 Supraventricular tachycardia, unspecified: Secondary | ICD-10-CM | POA: Diagnosis not present

## 2023-02-13 DIAGNOSIS — I120 Hypertensive chronic kidney disease with stage 5 chronic kidney disease or end stage renal disease: Secondary | ICD-10-CM | POA: Diagnosis not present

## 2023-02-13 DIAGNOSIS — R002 Palpitations: Secondary | ICD-10-CM | POA: Diagnosis not present

## 2023-02-13 DIAGNOSIS — I071 Rheumatic tricuspid insufficiency: Secondary | ICD-10-CM | POA: Diagnosis not present

## 2023-02-13 DIAGNOSIS — I480 Paroxysmal atrial fibrillation: Secondary | ICD-10-CM | POA: Diagnosis not present

## 2023-02-13 DIAGNOSIS — Z992 Dependence on renal dialysis: Secondary | ICD-10-CM | POA: Diagnosis not present

## 2023-02-13 DIAGNOSIS — Z7982 Long term (current) use of aspirin: Secondary | ICD-10-CM | POA: Diagnosis not present

## 2023-02-13 DIAGNOSIS — D509 Iron deficiency anemia, unspecified: Secondary | ICD-10-CM | POA: Diagnosis not present

## 2023-02-13 DIAGNOSIS — I10 Essential (primary) hypertension: Secondary | ICD-10-CM | POA: Diagnosis not present

## 2023-02-13 DIAGNOSIS — N186 End stage renal disease: Secondary | ICD-10-CM | POA: Diagnosis not present

## 2023-02-13 DIAGNOSIS — K769 Liver disease, unspecified: Secondary | ICD-10-CM | POA: Diagnosis not present

## 2023-02-13 DIAGNOSIS — Z79899 Other long term (current) drug therapy: Secondary | ICD-10-CM | POA: Diagnosis not present

## 2023-02-13 DIAGNOSIS — D631 Anemia in chronic kidney disease: Secondary | ICD-10-CM | POA: Diagnosis not present

## 2023-02-13 DIAGNOSIS — N2581 Secondary hyperparathyroidism of renal origin: Secondary | ICD-10-CM | POA: Diagnosis not present

## 2023-02-13 DIAGNOSIS — E1129 Type 2 diabetes mellitus with other diabetic kidney complication: Secondary | ICD-10-CM | POA: Diagnosis not present

## 2023-02-13 DIAGNOSIS — R82998 Other abnormal findings in urine: Secondary | ICD-10-CM | POA: Diagnosis not present

## 2023-02-13 DIAGNOSIS — N2589 Other disorders resulting from impaired renal tubular function: Secondary | ICD-10-CM | POA: Diagnosis not present

## 2023-02-14 ENCOUNTER — Ambulatory Visit: Payer: Medicare Other | Admitting: Clinical

## 2023-02-14 DIAGNOSIS — Z992 Dependence on renal dialysis: Secondary | ICD-10-CM | POA: Diagnosis not present

## 2023-02-14 DIAGNOSIS — D509 Iron deficiency anemia, unspecified: Secondary | ICD-10-CM | POA: Diagnosis not present

## 2023-02-14 DIAGNOSIS — N2581 Secondary hyperparathyroidism of renal origin: Secondary | ICD-10-CM | POA: Diagnosis not present

## 2023-02-14 DIAGNOSIS — F4321 Adjustment disorder with depressed mood: Secondary | ICD-10-CM | POA: Diagnosis not present

## 2023-02-14 DIAGNOSIS — K769 Liver disease, unspecified: Secondary | ICD-10-CM | POA: Diagnosis not present

## 2023-02-14 DIAGNOSIS — R82998 Other abnormal findings in urine: Secondary | ICD-10-CM | POA: Diagnosis not present

## 2023-02-14 DIAGNOSIS — E1129 Type 2 diabetes mellitus with other diabetic kidney complication: Secondary | ICD-10-CM | POA: Diagnosis not present

## 2023-02-14 DIAGNOSIS — D631 Anemia in chronic kidney disease: Secondary | ICD-10-CM | POA: Diagnosis not present

## 2023-02-14 DIAGNOSIS — N186 End stage renal disease: Secondary | ICD-10-CM | POA: Diagnosis not present

## 2023-02-14 DIAGNOSIS — N2589 Other disorders resulting from impaired renal tubular function: Secondary | ICD-10-CM | POA: Diagnosis not present

## 2023-02-14 NOTE — Progress Notes (Signed)
[facility] Counselor/Therapist Progress Note  Patient ID: Felicia Thompson, MRN: [id number],    Date: 02/14/2023  Time Spent: 9:37am - 10:18am : 41 minutes  Treatment Type: Individual Therapy  Reported Symptoms: Patient reported fatigue at times.   Mental Status Exam: Appearance:  Well Groomed     Behavior: Appropriate  Motor: Normal  Speech/Language:  Clear and Coherent  Affect: Appropriate  Mood: normal  Thought process: normal  Thought content:   WNL  Sensory/Perceptual disturbances:   WNL  Orientation: oriented to person, place, and situation  Attention: Good  Concentration: Good  Memory: WNL  Fund of knowledge:  Good  Insight:   Fair  Judgment:  Good  Impulse Control: Good   Risk Assessment: Danger to Self:  No Patient denied current suicidal ideation  Self-injurious Behavior: No Danger to Others: No Patient denied current homicidal ideation  Duty to Warn:no Physical Aggression / Violence:No  Access to Firearms a concern: No  Gang Involvement:No   Subjective: Patient reported two recent cataract surgeries since last session and reported she has been diagnosed with Afib. Patient reported she is on the kidney transplant list and was advised there is not an age restriction as she was advised in the past in another state. Patient reported she currently receives dialysis every night. Patient reported dialysis does not restrict patient from participating in activities during the day. Patient reported she has been diagnosed with anemia and receives injections as a result. Patient reported experiencing fatigue due to anemia. Patient reported a decrease in sleeping during the day since last session. Patient reported she has not gone outside to walk since last session. Patient reported she has been sleeping less during the day due to her daughter being upset with patient, her grandchildren, and stated, "knowing I need to make some changes". Patient stated,  "sometimes I feel shaky", difficulty walking long distances, and transportation are barriers to activities. Patient reported she was accepted to participate in [employer] but has not utilized the service yet. Patient stated, "good" in response to current mood.   Interventions: Cognitive Behavioral Therapy. Clinician conducted session via WebEx video from clinician's home office. Patient provided verbal consent to proceed with telehealth session and participated in session from patient's home. Reviewed events since last session. Discussed patient's medical issues and their impact on patient. Discussed patient's motivation for recent changes. Explored and identified barriers related to patient's goals. Explored and identified local transportation resources, such as, Diplomatic Services operational officer. Assessed current mood.    Diagnosis:  Adjustment disorder with depressed mood     Plan: Patient is to utilize [facility], thought re-framing, and coping strategies to decrease symptoms associated with their diagnosis. Frequency: bi-weekly  Modality: individual      Long-term goal:   Reduce overall level, frequency, and intensity of the feelings of depression as evidenced by decreased lack of energy, fatigue, loss of interest, lack of socialization, decreased concentration from 5 to 6  days/week to 0 to 1 days/week per patient report for at least 3 consecutive months.   Target Date: 01/28/24  Progress: 0    Short-term goal:  Decrease thoughts and feelings of guilt related to her husband's death from 5 days month per month to 0 days per month   Target Date: 07/28/23  Progress: 0    Increase patient's level of physical activity from 0 times per week to 1-2 days per week   Target Date: 07/28/23  Progress: 0    Increase participation  in social opportunities from 0 days per week to 1 day per week   Target Date: 07/28/23  Progress: 0    Establish and implement a daily self care routine, such  as, waking up at the same time each day, brushing patient's teeth, getting dressed, combing patient's hair.     Target Date: 07/28/23  Progress: 0         Katherina Right, LCSW

## 2023-02-14 NOTE — Progress Notes (Signed)
                Necie Kasarah Sitts, LCSW 

## 2023-02-15 DIAGNOSIS — D631 Anemia in chronic kidney disease: Secondary | ICD-10-CM | POA: Diagnosis not present

## 2023-02-15 DIAGNOSIS — R82998 Other abnormal findings in urine: Secondary | ICD-10-CM | POA: Diagnosis not present

## 2023-02-15 DIAGNOSIS — N2589 Other disorders resulting from impaired renal tubular function: Secondary | ICD-10-CM | POA: Diagnosis not present

## 2023-02-15 DIAGNOSIS — N2581 Secondary hyperparathyroidism of renal origin: Secondary | ICD-10-CM | POA: Diagnosis not present

## 2023-02-15 DIAGNOSIS — Z992 Dependence on renal dialysis: Secondary | ICD-10-CM | POA: Diagnosis not present

## 2023-02-15 DIAGNOSIS — D509 Iron deficiency anemia, unspecified: Secondary | ICD-10-CM | POA: Diagnosis not present

## 2023-02-15 DIAGNOSIS — E1129 Type 2 diabetes mellitus with other diabetic kidney complication: Secondary | ICD-10-CM | POA: Diagnosis not present

## 2023-02-15 DIAGNOSIS — K769 Liver disease, unspecified: Secondary | ICD-10-CM | POA: Diagnosis not present

## 2023-02-15 DIAGNOSIS — N186 End stage renal disease: Secondary | ICD-10-CM | POA: Diagnosis not present

## 2023-02-16 DIAGNOSIS — K769 Liver disease, unspecified: Secondary | ICD-10-CM | POA: Diagnosis not present

## 2023-02-16 DIAGNOSIS — D509 Iron deficiency anemia, unspecified: Secondary | ICD-10-CM | POA: Diagnosis not present

## 2023-02-16 DIAGNOSIS — N2581 Secondary hyperparathyroidism of renal origin: Secondary | ICD-10-CM | POA: Diagnosis not present

## 2023-02-16 DIAGNOSIS — D631 Anemia in chronic kidney disease: Secondary | ICD-10-CM | POA: Diagnosis not present

## 2023-02-16 DIAGNOSIS — R82998 Other abnormal findings in urine: Secondary | ICD-10-CM | POA: Diagnosis not present

## 2023-02-16 DIAGNOSIS — E1129 Type 2 diabetes mellitus with other diabetic kidney complication: Secondary | ICD-10-CM | POA: Diagnosis not present

## 2023-02-16 DIAGNOSIS — Z992 Dependence on renal dialysis: Secondary | ICD-10-CM | POA: Diagnosis not present

## 2023-02-16 DIAGNOSIS — N2589 Other disorders resulting from impaired renal tubular function: Secondary | ICD-10-CM | POA: Diagnosis not present

## 2023-02-16 DIAGNOSIS — N186 End stage renal disease: Secondary | ICD-10-CM | POA: Diagnosis not present

## 2023-02-17 DIAGNOSIS — D509 Iron deficiency anemia, unspecified: Secondary | ICD-10-CM | POA: Diagnosis not present

## 2023-02-17 DIAGNOSIS — N186 End stage renal disease: Secondary | ICD-10-CM | POA: Diagnosis not present

## 2023-02-17 DIAGNOSIS — Z992 Dependence on renal dialysis: Secondary | ICD-10-CM | POA: Diagnosis not present

## 2023-02-17 DIAGNOSIS — E1129 Type 2 diabetes mellitus with other diabetic kidney complication: Secondary | ICD-10-CM | POA: Diagnosis not present

## 2023-02-17 DIAGNOSIS — N2581 Secondary hyperparathyroidism of renal origin: Secondary | ICD-10-CM | POA: Diagnosis not present

## 2023-02-17 DIAGNOSIS — N2589 Other disorders resulting from impaired renal tubular function: Secondary | ICD-10-CM | POA: Diagnosis not present

## 2023-02-17 DIAGNOSIS — D631 Anemia in chronic kidney disease: Secondary | ICD-10-CM | POA: Diagnosis not present

## 2023-02-17 DIAGNOSIS — R82998 Other abnormal findings in urine: Secondary | ICD-10-CM | POA: Diagnosis not present

## 2023-02-17 DIAGNOSIS — K769 Liver disease, unspecified: Secondary | ICD-10-CM | POA: Diagnosis not present

## 2023-02-18 DIAGNOSIS — N2589 Other disorders resulting from impaired renal tubular function: Secondary | ICD-10-CM | POA: Diagnosis not present

## 2023-02-18 DIAGNOSIS — K769 Liver disease, unspecified: Secondary | ICD-10-CM | POA: Diagnosis not present

## 2023-02-18 DIAGNOSIS — D631 Anemia in chronic kidney disease: Secondary | ICD-10-CM | POA: Diagnosis not present

## 2023-02-18 DIAGNOSIS — N186 End stage renal disease: Secondary | ICD-10-CM | POA: Diagnosis not present

## 2023-02-18 DIAGNOSIS — R82998 Other abnormal findings in urine: Secondary | ICD-10-CM | POA: Diagnosis not present

## 2023-02-18 DIAGNOSIS — Z992 Dependence on renal dialysis: Secondary | ICD-10-CM | POA: Diagnosis not present

## 2023-02-18 DIAGNOSIS — D509 Iron deficiency anemia, unspecified: Secondary | ICD-10-CM | POA: Diagnosis not present

## 2023-02-18 DIAGNOSIS — N2581 Secondary hyperparathyroidism of renal origin: Secondary | ICD-10-CM | POA: Diagnosis not present

## 2023-02-18 DIAGNOSIS — E1129 Type 2 diabetes mellitus with other diabetic kidney complication: Secondary | ICD-10-CM | POA: Diagnosis not present

## 2023-02-19 DIAGNOSIS — D631 Anemia in chronic kidney disease: Secondary | ICD-10-CM | POA: Diagnosis not present

## 2023-02-19 DIAGNOSIS — D509 Iron deficiency anemia, unspecified: Secondary | ICD-10-CM | POA: Diagnosis not present

## 2023-02-19 DIAGNOSIS — R82998 Other abnormal findings in urine: Secondary | ICD-10-CM | POA: Diagnosis not present

## 2023-02-19 DIAGNOSIS — E1129 Type 2 diabetes mellitus with other diabetic kidney complication: Secondary | ICD-10-CM | POA: Diagnosis not present

## 2023-02-19 DIAGNOSIS — K769 Liver disease, unspecified: Secondary | ICD-10-CM | POA: Diagnosis not present

## 2023-02-19 DIAGNOSIS — N186 End stage renal disease: Secondary | ICD-10-CM | POA: Diagnosis not present

## 2023-02-19 DIAGNOSIS — N2581 Secondary hyperparathyroidism of renal origin: Secondary | ICD-10-CM | POA: Diagnosis not present

## 2023-02-19 DIAGNOSIS — N2589 Other disorders resulting from impaired renal tubular function: Secondary | ICD-10-CM | POA: Diagnosis not present

## 2023-02-19 DIAGNOSIS — Z992 Dependence on renal dialysis: Secondary | ICD-10-CM | POA: Diagnosis not present

## 2023-02-20 DIAGNOSIS — N2589 Other disorders resulting from impaired renal tubular function: Secondary | ICD-10-CM | POA: Diagnosis not present

## 2023-02-20 DIAGNOSIS — E1129 Type 2 diabetes mellitus with other diabetic kidney complication: Secondary | ICD-10-CM | POA: Diagnosis not present

## 2023-02-20 DIAGNOSIS — R82998 Other abnormal findings in urine: Secondary | ICD-10-CM | POA: Diagnosis not present

## 2023-02-20 DIAGNOSIS — K769 Liver disease, unspecified: Secondary | ICD-10-CM | POA: Diagnosis not present

## 2023-02-20 DIAGNOSIS — N2581 Secondary hyperparathyroidism of renal origin: Secondary | ICD-10-CM | POA: Diagnosis not present

## 2023-02-20 DIAGNOSIS — D509 Iron deficiency anemia, unspecified: Secondary | ICD-10-CM | POA: Diagnosis not present

## 2023-02-20 DIAGNOSIS — N186 End stage renal disease: Secondary | ICD-10-CM | POA: Diagnosis not present

## 2023-02-20 DIAGNOSIS — D631 Anemia in chronic kidney disease: Secondary | ICD-10-CM | POA: Diagnosis not present

## 2023-02-20 DIAGNOSIS — Z992 Dependence on renal dialysis: Secondary | ICD-10-CM | POA: Diagnosis not present

## 2023-02-21 DIAGNOSIS — R82998 Other abnormal findings in urine: Secondary | ICD-10-CM | POA: Diagnosis not present

## 2023-02-21 DIAGNOSIS — D631 Anemia in chronic kidney disease: Secondary | ICD-10-CM | POA: Diagnosis not present

## 2023-02-21 DIAGNOSIS — N2581 Secondary hyperparathyroidism of renal origin: Secondary | ICD-10-CM | POA: Diagnosis not present

## 2023-02-21 DIAGNOSIS — N2589 Other disorders resulting from impaired renal tubular function: Secondary | ICD-10-CM | POA: Diagnosis not present

## 2023-02-21 DIAGNOSIS — N186 End stage renal disease: Secondary | ICD-10-CM | POA: Diagnosis not present

## 2023-02-21 DIAGNOSIS — E1129 Type 2 diabetes mellitus with other diabetic kidney complication: Secondary | ICD-10-CM | POA: Diagnosis not present

## 2023-02-21 DIAGNOSIS — Z992 Dependence on renal dialysis: Secondary | ICD-10-CM | POA: Diagnosis not present

## 2023-02-21 DIAGNOSIS — K769 Liver disease, unspecified: Secondary | ICD-10-CM | POA: Diagnosis not present

## 2023-02-21 DIAGNOSIS — D509 Iron deficiency anemia, unspecified: Secondary | ICD-10-CM | POA: Diagnosis not present

## 2023-02-22 DIAGNOSIS — N2589 Other disorders resulting from impaired renal tubular function: Secondary | ICD-10-CM | POA: Diagnosis not present

## 2023-02-22 DIAGNOSIS — D631 Anemia in chronic kidney disease: Secondary | ICD-10-CM | POA: Diagnosis not present

## 2023-02-22 DIAGNOSIS — Z992 Dependence on renal dialysis: Secondary | ICD-10-CM | POA: Diagnosis not present

## 2023-02-22 DIAGNOSIS — K769 Liver disease, unspecified: Secondary | ICD-10-CM | POA: Diagnosis not present

## 2023-02-22 DIAGNOSIS — D509 Iron deficiency anemia, unspecified: Secondary | ICD-10-CM | POA: Diagnosis not present

## 2023-02-22 DIAGNOSIS — N2581 Secondary hyperparathyroidism of renal origin: Secondary | ICD-10-CM | POA: Diagnosis not present

## 2023-02-22 DIAGNOSIS — E1129 Type 2 diabetes mellitus with other diabetic kidney complication: Secondary | ICD-10-CM | POA: Diagnosis not present

## 2023-02-22 DIAGNOSIS — N186 End stage renal disease: Secondary | ICD-10-CM | POA: Diagnosis not present

## 2023-02-22 DIAGNOSIS — R82998 Other abnormal findings in urine: Secondary | ICD-10-CM | POA: Diagnosis not present

## 2023-02-23 DIAGNOSIS — E1129 Type 2 diabetes mellitus with other diabetic kidney complication: Secondary | ICD-10-CM | POA: Diagnosis not present

## 2023-02-23 DIAGNOSIS — D631 Anemia in chronic kidney disease: Secondary | ICD-10-CM | POA: Diagnosis not present

## 2023-02-23 DIAGNOSIS — Z992 Dependence on renal dialysis: Secondary | ICD-10-CM | POA: Diagnosis not present

## 2023-02-23 DIAGNOSIS — K769 Liver disease, unspecified: Secondary | ICD-10-CM | POA: Diagnosis not present

## 2023-02-23 DIAGNOSIS — N2589 Other disorders resulting from impaired renal tubular function: Secondary | ICD-10-CM | POA: Diagnosis not present

## 2023-02-23 DIAGNOSIS — N2581 Secondary hyperparathyroidism of renal origin: Secondary | ICD-10-CM | POA: Diagnosis not present

## 2023-02-23 DIAGNOSIS — R82998 Other abnormal findings in urine: Secondary | ICD-10-CM | POA: Diagnosis not present

## 2023-02-23 DIAGNOSIS — N186 End stage renal disease: Secondary | ICD-10-CM | POA: Diagnosis not present

## 2023-02-23 DIAGNOSIS — D509 Iron deficiency anemia, unspecified: Secondary | ICD-10-CM | POA: Diagnosis not present

## 2023-02-24 ENCOUNTER — Ambulatory Visit: Payer: Self-pay | Admitting: Licensed Clinical Social Worker

## 2023-02-24 DIAGNOSIS — D631 Anemia in chronic kidney disease: Secondary | ICD-10-CM | POA: Diagnosis not present

## 2023-02-24 DIAGNOSIS — E1129 Type 2 diabetes mellitus with other diabetic kidney complication: Secondary | ICD-10-CM | POA: Diagnosis not present

## 2023-02-24 DIAGNOSIS — N2581 Secondary hyperparathyroidism of renal origin: Secondary | ICD-10-CM | POA: Diagnosis not present

## 2023-02-24 DIAGNOSIS — K769 Liver disease, unspecified: Secondary | ICD-10-CM | POA: Diagnosis not present

## 2023-02-24 DIAGNOSIS — N186 End stage renal disease: Secondary | ICD-10-CM | POA: Diagnosis not present

## 2023-02-24 DIAGNOSIS — R82998 Other abnormal findings in urine: Secondary | ICD-10-CM | POA: Diagnosis not present

## 2023-02-24 DIAGNOSIS — D509 Iron deficiency anemia, unspecified: Secondary | ICD-10-CM | POA: Diagnosis not present

## 2023-02-24 DIAGNOSIS — N2589 Other disorders resulting from impaired renal tubular function: Secondary | ICD-10-CM | POA: Diagnosis not present

## 2023-02-24 DIAGNOSIS — Z992 Dependence on renal dialysis: Secondary | ICD-10-CM | POA: Diagnosis not present

## 2023-02-25 DIAGNOSIS — K769 Liver disease, unspecified: Secondary | ICD-10-CM | POA: Diagnosis not present

## 2023-02-25 DIAGNOSIS — D509 Iron deficiency anemia, unspecified: Secondary | ICD-10-CM | POA: Diagnosis not present

## 2023-02-25 DIAGNOSIS — N2581 Secondary hyperparathyroidism of renal origin: Secondary | ICD-10-CM | POA: Diagnosis not present

## 2023-02-25 DIAGNOSIS — Z992 Dependence on renal dialysis: Secondary | ICD-10-CM | POA: Diagnosis not present

## 2023-02-25 DIAGNOSIS — N2589 Other disorders resulting from impaired renal tubular function: Secondary | ICD-10-CM | POA: Diagnosis not present

## 2023-02-25 DIAGNOSIS — R82998 Other abnormal findings in urine: Secondary | ICD-10-CM | POA: Diagnosis not present

## 2023-02-25 DIAGNOSIS — E1129 Type 2 diabetes mellitus with other diabetic kidney complication: Secondary | ICD-10-CM | POA: Diagnosis not present

## 2023-02-25 DIAGNOSIS — N186 End stage renal disease: Secondary | ICD-10-CM | POA: Diagnosis not present

## 2023-02-25 DIAGNOSIS — D631 Anemia in chronic kidney disease: Secondary | ICD-10-CM | POA: Diagnosis not present

## 2023-02-25 NOTE — Patient Outreach (Signed)
  Care Coordination   Follow Up Visit Note   02/24/23 Name: Felicia Thompson MRN: [id number] DOB: 1946-06-07  Felicia Thompson is a 77 y.o. year old female who sees Isaac Bliss, Rayford Halsted, MD for primary care. I spoke with  [name] by phone today.  What matters to the patients health and wellness today?  Transportation    Goals Addressed             This Visit's Progress    LCSW Plan of Care-Obtain Supportive Resources   On track    Activities and task to complete in order to accomplish goals.   Keep all upcoming appointments discussed today Continue with compliance of taking medication prescribed by Doctor Continue working with [facility] care team to assist with goals identified I have placed a referral to the community resource care guide to assist with resources for transportation needs. LCSW will f/up with [employer] about mile restrictions for med appts        SDOH assessments and interventions completed:  No     Care Coordination Interventions:  Yes, provided  Interventions Today    Flowsheet Row Most Recent Value  Chronic Disease   Chronic disease during today's visit Hypertension (HTN), Diabetes  General Interventions   General Interventions Discussed/Reviewed General Interventions Reviewed, Doctor Visits  [Patient continues to receive strong support from family. LCSW will confirm elighibity restrictions regarding [employer] transportation to medical appts]  Doctor Visits Discussed/Reviewed Doctor Visits Reviewed  [Pt reviewed recent [facility] appts. Endorses good reports]       Follow up plan: Follow up call scheduled for 2-4 weeks    Encounter Outcome:  Pt. Visit Completed   Christa See, MSW, [email or web address] Phone [phone] 10:35 AM

## 2023-02-25 NOTE — Patient Instructions (Signed)
Visit Information  Thank you for taking time to visit with me today. Please don't hesitate to contact me if I can be of assistance to you.   Following are the goals we discussed today:   Goals Addressed             This Visit's Progress    LCSW Plan of Care-Obtain Supportive Resources   On track    Activities and task to complete in order to accomplish goals.   Keep all upcoming appointments discussed today Continue with compliance of taking medication prescribed by Doctor Continue working with [facility] care team to assist with goals identified I have placed a referral to the community resource care guide to assist with resources for transportation needs. LCSW will f/up with [employer] about mile restrictions for med appts        Our next appointment is by telephone on 4/22 at 11:30 AM  Please call the care guide team at [phone] if you need to cancel or reschedule your appointment.   If you are experiencing a Mental Health or [place] or need someone to talk to, please call the Suicide and Crisis Lifeline: 988 call 911   Patient verbalizes understanding of instructions and care plan provided today and agrees to view in [place]. Active MyChart status and patient understanding of how to access instructions and care plan via MyChart confirmed with patient.     Christa See, MSW, [email or web address] Phone [phone] 10:36 AM

## 2023-02-26 DIAGNOSIS — N2589 Other disorders resulting from impaired renal tubular function: Secondary | ICD-10-CM | POA: Diagnosis not present

## 2023-02-26 DIAGNOSIS — D509 Iron deficiency anemia, unspecified: Secondary | ICD-10-CM | POA: Diagnosis not present

## 2023-02-26 DIAGNOSIS — K769 Liver disease, unspecified: Secondary | ICD-10-CM | POA: Diagnosis not present

## 2023-02-26 DIAGNOSIS — R82998 Other abnormal findings in urine: Secondary | ICD-10-CM | POA: Diagnosis not present

## 2023-02-26 DIAGNOSIS — E1129 Type 2 diabetes mellitus with other diabetic kidney complication: Secondary | ICD-10-CM | POA: Diagnosis not present

## 2023-02-26 DIAGNOSIS — N186 End stage renal disease: Secondary | ICD-10-CM | POA: Diagnosis not present

## 2023-02-26 DIAGNOSIS — Z992 Dependence on renal dialysis: Secondary | ICD-10-CM | POA: Diagnosis not present

## 2023-02-26 DIAGNOSIS — N2581 Secondary hyperparathyroidism of renal origin: Secondary | ICD-10-CM | POA: Diagnosis not present

## 2023-02-26 DIAGNOSIS — D631 Anemia in chronic kidney disease: Secondary | ICD-10-CM | POA: Diagnosis not present

## 2023-02-27 DIAGNOSIS — E1129 Type 2 diabetes mellitus with other diabetic kidney complication: Secondary | ICD-10-CM | POA: Diagnosis not present

## 2023-02-27 DIAGNOSIS — N2581 Secondary hyperparathyroidism of renal origin: Secondary | ICD-10-CM | POA: Diagnosis not present

## 2023-02-27 DIAGNOSIS — Z992 Dependence on renal dialysis: Secondary | ICD-10-CM | POA: Diagnosis not present

## 2023-02-27 DIAGNOSIS — D509 Iron deficiency anemia, unspecified: Secondary | ICD-10-CM | POA: Diagnosis not present

## 2023-02-27 DIAGNOSIS — K769 Liver disease, unspecified: Secondary | ICD-10-CM | POA: Diagnosis not present

## 2023-02-27 DIAGNOSIS — N186 End stage renal disease: Secondary | ICD-10-CM | POA: Diagnosis not present

## 2023-02-27 DIAGNOSIS — R82998 Other abnormal findings in urine: Secondary | ICD-10-CM | POA: Diagnosis not present

## 2023-02-27 DIAGNOSIS — N2589 Other disorders resulting from impaired renal tubular function: Secondary | ICD-10-CM | POA: Diagnosis not present

## 2023-02-27 DIAGNOSIS — D631 Anemia in chronic kidney disease: Secondary | ICD-10-CM | POA: Diagnosis not present

## 2023-02-28 DIAGNOSIS — E1129 Type 2 diabetes mellitus with other diabetic kidney complication: Secondary | ICD-10-CM | POA: Diagnosis not present

## 2023-02-28 DIAGNOSIS — N2581 Secondary hyperparathyroidism of renal origin: Secondary | ICD-10-CM | POA: Diagnosis not present

## 2023-02-28 DIAGNOSIS — N186 End stage renal disease: Secondary | ICD-10-CM | POA: Diagnosis not present

## 2023-02-28 DIAGNOSIS — G4733 Obstructive sleep apnea (adult) (pediatric): Secondary | ICD-10-CM | POA: Diagnosis not present

## 2023-02-28 DIAGNOSIS — K769 Liver disease, unspecified: Secondary | ICD-10-CM | POA: Diagnosis not present

## 2023-02-28 DIAGNOSIS — Z992 Dependence on renal dialysis: Secondary | ICD-10-CM | POA: Diagnosis not present

## 2023-02-28 DIAGNOSIS — R82998 Other abnormal findings in urine: Secondary | ICD-10-CM | POA: Diagnosis not present

## 2023-02-28 DIAGNOSIS — D509 Iron deficiency anemia, unspecified: Secondary | ICD-10-CM | POA: Diagnosis not present

## 2023-02-28 DIAGNOSIS — N2589 Other disorders resulting from impaired renal tubular function: Secondary | ICD-10-CM | POA: Diagnosis not present

## 2023-02-28 DIAGNOSIS — D631 Anemia in chronic kidney disease: Secondary | ICD-10-CM | POA: Diagnosis not present

## 2023-03-01 DIAGNOSIS — N2589 Other disorders resulting from impaired renal tubular function: Secondary | ICD-10-CM | POA: Diagnosis not present

## 2023-03-01 DIAGNOSIS — N2581 Secondary hyperparathyroidism of renal origin: Secondary | ICD-10-CM | POA: Diagnosis not present

## 2023-03-01 DIAGNOSIS — N186 End stage renal disease: Secondary | ICD-10-CM | POA: Diagnosis not present

## 2023-03-01 DIAGNOSIS — E1129 Type 2 diabetes mellitus with other diabetic kidney complication: Secondary | ICD-10-CM | POA: Diagnosis not present

## 2023-03-01 DIAGNOSIS — Z992 Dependence on renal dialysis: Secondary | ICD-10-CM | POA: Diagnosis not present

## 2023-03-01 DIAGNOSIS — R82998 Other abnormal findings in urine: Secondary | ICD-10-CM | POA: Diagnosis not present

## 2023-03-01 DIAGNOSIS — K769 Liver disease, unspecified: Secondary | ICD-10-CM | POA: Diagnosis not present

## 2023-03-01 DIAGNOSIS — D509 Iron deficiency anemia, unspecified: Secondary | ICD-10-CM | POA: Diagnosis not present

## 2023-03-01 DIAGNOSIS — D631 Anemia in chronic kidney disease: Secondary | ICD-10-CM | POA: Diagnosis not present

## 2023-03-02 DIAGNOSIS — D509 Iron deficiency anemia, unspecified: Secondary | ICD-10-CM | POA: Diagnosis not present

## 2023-03-02 DIAGNOSIS — R82998 Other abnormal findings in urine: Secondary | ICD-10-CM | POA: Diagnosis not present

## 2023-03-02 DIAGNOSIS — N2581 Secondary hyperparathyroidism of renal origin: Secondary | ICD-10-CM | POA: Diagnosis not present

## 2023-03-02 DIAGNOSIS — Z992 Dependence on renal dialysis: Secondary | ICD-10-CM | POA: Diagnosis not present

## 2023-03-02 DIAGNOSIS — K769 Liver disease, unspecified: Secondary | ICD-10-CM | POA: Diagnosis not present

## 2023-03-02 DIAGNOSIS — N186 End stage renal disease: Secondary | ICD-10-CM | POA: Diagnosis not present

## 2023-03-02 DIAGNOSIS — D631 Anemia in chronic kidney disease: Secondary | ICD-10-CM | POA: Diagnosis not present

## 2023-03-02 DIAGNOSIS — N2589 Other disorders resulting from impaired renal tubular function: Secondary | ICD-10-CM | POA: Diagnosis not present

## 2023-03-02 DIAGNOSIS — E1129 Type 2 diabetes mellitus with other diabetic kidney complication: Secondary | ICD-10-CM | POA: Diagnosis not present

## 2023-03-03 ENCOUNTER — Ambulatory Visit (INDEPENDENT_AMBULATORY_CARE_PROVIDER_SITE_OTHER): Payer: Medicare Other | Admitting: Clinical

## 2023-03-03 DIAGNOSIS — F4321 Adjustment disorder with depressed mood: Secondary | ICD-10-CM | POA: Diagnosis not present

## 2023-03-03 DIAGNOSIS — N186 End stage renal disease: Secondary | ICD-10-CM | POA: Diagnosis not present

## 2023-03-03 DIAGNOSIS — Z992 Dependence on renal dialysis: Secondary | ICD-10-CM | POA: Diagnosis not present

## 2023-03-03 DIAGNOSIS — N2581 Secondary hyperparathyroidism of renal origin: Secondary | ICD-10-CM | POA: Diagnosis not present

## 2023-03-03 NOTE — Progress Notes (Signed)
[facility] Counselor/Therapist Progress Note  Patient ID: Felicia Thompson, MRN: [id number],    Date: 03/03/2023  Time Spent: 9:33am - 10:13am : 40 minutes   Treatment Type: Individual Therapy  Reported Symptoms: Patient stated, "good, I think I've actually felt better" in response to mood since last session.   Mental Status Exam: Appearance:  Well Groomed     Behavior: Appropriate  Motor: Normal  Speech/Language:  Clear and Coherent  Affect: Appropriate  Mood: normal  Thought process: normal  Thought content:   WNL  Sensory/Perceptual disturbances:   WNL  Orientation: oriented to person, place, and situation  Attention: Good  Concentration: Good  Memory: WNL  Fund of knowledge:  Good  Insight:   Fair  Judgment:  Good  Impulse Control: Good   Risk Assessment: Danger to Self:  No Patient denied current suicidal ideation  Self-injurious Behavior: No Danger to Others: No Patient denied current homicidal ideation  Duty to Warn:no Physical Aggression / Violence:No  Access to Firearms a concern: No  Gang Involvement:No   Subjective: Patient reported her children recommended patient utilize a caregiver in the home to assist patient with organization, motivation, and daily tasks. Patient reported the caregiver is present four days per week for four hours each day. Patient reported the caregiver has assisted patient with organizing her mail, with laundry and making her bed. Patient reported she has not started walking yet. Patient stated "that usually wears me out" in response to showering and reported her neck hurts when showering. Patient reported she utilizes a shower chair when showering. Patient reported she does not get dressed for the day if she does not have to leave the house.  Patient stated, "good, I think I've actually felt better" in response to mood since last session. Patient reported having her caregiver to talk to "has brightened my mood". Patient  stated, "I think its good" in response to current mood.   Interventions: Cognitive Behavioral Therapy and Motivational Interviewing. Clinician conducted session via WebEx video from clinician's home office. Patient provided verbal consent to proceed with telehealth session and participated in session from patient's home. Patient reported her caregiver, Leda Gauze, was present during today's session and patient provided verbal consent for caregiver to be present during today's session. Reviewed events since last session. Discussed patient's goals as it relates to having a caregiver in the home. Reviewed patient's goals for therapy and status of goals. Explored and identified barriers to self care. Explored strategies to utilize in response to barriers to showering and getting dressed daily. Provided psycho education related to building new habits and breaking habits into steps. Assessed patient's mood. Explored and identified contributing factors to recent improvement in mood.    Diagnosis:  Adjustment disorder with depressed mood     Plan: Patient is to utilize [employer] [facility], thought re-framing, and coping strategies to decrease symptoms associated with their diagnosis. Frequency: bi-weekly  Modality: individual      Long-term goal:   Reduce overall level, frequency, and intensity of the feelings of depression as evidenced by decreased lack of energy, fatigue, loss of interest, lack of socialization, decreased concentration from 5 to 6  days/week to 0 to 1 days/week per patient report for at least 3 consecutive months.   Target Date: 01/28/24  Progress: 0    Short-term goal:  Decrease thoughts and feelings of guilt related to her husband's death from 5 days month per month to 0 days per month   Target Date: 07/28/23  Progress: 0    Increase patient's level of physical activity from 0 times per week to 1-2 days per week   Target Date: 07/28/23  Progress: 0    Increase  participation in social opportunities from 0 days per week to 1 day per week   Target Date: 07/28/23  Progress: 0    Establish and implement a daily self care routine, such as, waking up at the same time each day, brushing patient's teeth, getting dressed, combing patient's hair.     Target Date: 07/28/23  Progress: 0           Katherina Right, LCSW

## 2023-03-03 NOTE — Progress Notes (Signed)
                Kenza Elexis Pollak, LCSW 

## 2023-03-04 DIAGNOSIS — Z992 Dependence on renal dialysis: Secondary | ICD-10-CM | POA: Diagnosis not present

## 2023-03-04 DIAGNOSIS — N186 End stage renal disease: Secondary | ICD-10-CM | POA: Diagnosis not present

## 2023-03-04 DIAGNOSIS — N2581 Secondary hyperparathyroidism of renal origin: Secondary | ICD-10-CM | POA: Diagnosis not present

## 2023-03-05 DIAGNOSIS — Z992 Dependence on renal dialysis: Secondary | ICD-10-CM | POA: Diagnosis not present

## 2023-03-05 DIAGNOSIS — N186 End stage renal disease: Secondary | ICD-10-CM | POA: Diagnosis not present

## 2023-03-05 DIAGNOSIS — N2581 Secondary hyperparathyroidism of renal origin: Secondary | ICD-10-CM | POA: Diagnosis not present

## 2023-03-06 DIAGNOSIS — N2581 Secondary hyperparathyroidism of renal origin: Secondary | ICD-10-CM | POA: Diagnosis not present

## 2023-03-06 DIAGNOSIS — N186 End stage renal disease: Secondary | ICD-10-CM | POA: Diagnosis not present

## 2023-03-06 DIAGNOSIS — Z992 Dependence on renal dialysis: Secondary | ICD-10-CM | POA: Diagnosis not present

## 2023-03-07 DIAGNOSIS — Z992 Dependence on renal dialysis: Secondary | ICD-10-CM | POA: Diagnosis not present

## 2023-03-07 DIAGNOSIS — N186 End stage renal disease: Secondary | ICD-10-CM | POA: Diagnosis not present

## 2023-03-07 DIAGNOSIS — H6990 Unspecified Eustachian tube disorder, unspecified ear: Secondary | ICD-10-CM | POA: Diagnosis not present

## 2023-03-07 DIAGNOSIS — H6121 Impacted cerumen, right ear: Secondary | ICD-10-CM | POA: Diagnosis not present

## 2023-03-07 DIAGNOSIS — N2581 Secondary hyperparathyroidism of renal origin: Secondary | ICD-10-CM | POA: Diagnosis not present

## 2023-03-08 DIAGNOSIS — Z992 Dependence on renal dialysis: Secondary | ICD-10-CM | POA: Diagnosis not present

## 2023-03-08 DIAGNOSIS — N2581 Secondary hyperparathyroidism of renal origin: Secondary | ICD-10-CM | POA: Diagnosis not present

## 2023-03-08 DIAGNOSIS — N186 End stage renal disease: Secondary | ICD-10-CM | POA: Diagnosis not present

## 2023-03-09 DIAGNOSIS — Z992 Dependence on renal dialysis: Secondary | ICD-10-CM | POA: Diagnosis not present

## 2023-03-09 DIAGNOSIS — N186 End stage renal disease: Secondary | ICD-10-CM | POA: Diagnosis not present

## 2023-03-09 DIAGNOSIS — N2581 Secondary hyperparathyroidism of renal origin: Secondary | ICD-10-CM | POA: Diagnosis not present

## 2023-03-12 DIAGNOSIS — N2581 Secondary hyperparathyroidism of renal origin: Secondary | ICD-10-CM | POA: Diagnosis not present

## 2023-03-12 DIAGNOSIS — Z992 Dependence on renal dialysis: Secondary | ICD-10-CM | POA: Diagnosis not present

## 2023-03-12 DIAGNOSIS — N186 End stage renal disease: Secondary | ICD-10-CM | POA: Diagnosis not present

## 2023-03-13 DIAGNOSIS — N2581 Secondary hyperparathyroidism of renal origin: Secondary | ICD-10-CM | POA: Diagnosis not present

## 2023-03-13 DIAGNOSIS — N186 End stage renal disease: Secondary | ICD-10-CM | POA: Diagnosis not present

## 2023-03-13 DIAGNOSIS — Z992 Dependence on renal dialysis: Secondary | ICD-10-CM | POA: Diagnosis not present

## 2023-03-14 DIAGNOSIS — N186 End stage renal disease: Secondary | ICD-10-CM | POA: Diagnosis not present

## 2023-03-14 DIAGNOSIS — N2581 Secondary hyperparathyroidism of renal origin: Secondary | ICD-10-CM | POA: Diagnosis not present

## 2023-03-14 DIAGNOSIS — Z992 Dependence on renal dialysis: Secondary | ICD-10-CM | POA: Diagnosis not present

## 2023-03-15 DIAGNOSIS — N2581 Secondary hyperparathyroidism of renal origin: Secondary | ICD-10-CM | POA: Diagnosis not present

## 2023-03-15 DIAGNOSIS — N186 End stage renal disease: Secondary | ICD-10-CM | POA: Diagnosis not present

## 2023-03-15 DIAGNOSIS — Z992 Dependence on renal dialysis: Secondary | ICD-10-CM | POA: Diagnosis not present

## 2023-03-16 DIAGNOSIS — N186 End stage renal disease: Secondary | ICD-10-CM | POA: Diagnosis not present

## 2023-03-16 DIAGNOSIS — Z992 Dependence on renal dialysis: Secondary | ICD-10-CM | POA: Diagnosis not present

## 2023-03-16 DIAGNOSIS — N2581 Secondary hyperparathyroidism of renal origin: Secondary | ICD-10-CM | POA: Diagnosis not present

## 2023-03-17 DIAGNOSIS — Z992 Dependence on renal dialysis: Secondary | ICD-10-CM | POA: Diagnosis not present

## 2023-03-17 DIAGNOSIS — N186 End stage renal disease: Secondary | ICD-10-CM | POA: Diagnosis not present

## 2023-03-17 DIAGNOSIS — N2581 Secondary hyperparathyroidism of renal origin: Secondary | ICD-10-CM | POA: Diagnosis not present

## 2023-03-18 DIAGNOSIS — N2581 Secondary hyperparathyroidism of renal origin: Secondary | ICD-10-CM | POA: Diagnosis not present

## 2023-03-18 DIAGNOSIS — N186 End stage renal disease: Secondary | ICD-10-CM | POA: Diagnosis not present

## 2023-03-18 DIAGNOSIS — Z992 Dependence on renal dialysis: Secondary | ICD-10-CM | POA: Diagnosis not present

## 2023-03-19 DIAGNOSIS — N186 End stage renal disease: Secondary | ICD-10-CM | POA: Diagnosis not present

## 2023-03-19 DIAGNOSIS — N2581 Secondary hyperparathyroidism of renal origin: Secondary | ICD-10-CM | POA: Diagnosis not present

## 2023-03-19 DIAGNOSIS — Z992 Dependence on renal dialysis: Secondary | ICD-10-CM | POA: Diagnosis not present

## 2023-03-20 DIAGNOSIS — N2581 Secondary hyperparathyroidism of renal origin: Secondary | ICD-10-CM | POA: Diagnosis not present

## 2023-03-20 DIAGNOSIS — Z992 Dependence on renal dialysis: Secondary | ICD-10-CM | POA: Diagnosis not present

## 2023-03-20 DIAGNOSIS — N186 End stage renal disease: Secondary | ICD-10-CM | POA: Diagnosis not present

## 2023-03-21 DIAGNOSIS — N2581 Secondary hyperparathyroidism of renal origin: Secondary | ICD-10-CM | POA: Diagnosis not present

## 2023-03-21 DIAGNOSIS — N186 End stage renal disease: Secondary | ICD-10-CM | POA: Diagnosis not present

## 2023-03-21 DIAGNOSIS — Z992 Dependence on renal dialysis: Secondary | ICD-10-CM | POA: Diagnosis not present

## 2023-03-22 DIAGNOSIS — Z992 Dependence on renal dialysis: Secondary | ICD-10-CM | POA: Diagnosis not present

## 2023-03-22 DIAGNOSIS — N186 End stage renal disease: Secondary | ICD-10-CM | POA: Diagnosis not present

## 2023-03-22 DIAGNOSIS — N2581 Secondary hyperparathyroidism of renal origin: Secondary | ICD-10-CM | POA: Diagnosis not present

## 2023-03-23 DIAGNOSIS — Z992 Dependence on renal dialysis: Secondary | ICD-10-CM | POA: Diagnosis not present

## 2023-03-23 DIAGNOSIS — N186 End stage renal disease: Secondary | ICD-10-CM | POA: Diagnosis not present

## 2023-03-23 DIAGNOSIS — N2581 Secondary hyperparathyroidism of renal origin: Secondary | ICD-10-CM | POA: Diagnosis not present

## 2023-03-24 ENCOUNTER — Ambulatory Visit: Payer: Self-pay | Admitting: Licensed Clinical Social Worker

## 2023-03-24 DIAGNOSIS — N186 End stage renal disease: Secondary | ICD-10-CM | POA: Diagnosis not present

## 2023-03-24 DIAGNOSIS — N2581 Secondary hyperparathyroidism of renal origin: Secondary | ICD-10-CM | POA: Diagnosis not present

## 2023-03-24 DIAGNOSIS — Z992 Dependence on renal dialysis: Secondary | ICD-10-CM | POA: Diagnosis not present

## 2023-03-25 DIAGNOSIS — Z992 Dependence on renal dialysis: Secondary | ICD-10-CM | POA: Diagnosis not present

## 2023-03-25 DIAGNOSIS — N186 End stage renal disease: Secondary | ICD-10-CM | POA: Diagnosis not present

## 2023-03-25 DIAGNOSIS — N2581 Secondary hyperparathyroidism of renal origin: Secondary | ICD-10-CM | POA: Diagnosis not present

## 2023-03-25 NOTE — Patient Instructions (Signed)
Visit Information  Thank you for taking time to visit with me today. Please don't hesitate to contact me if I can be of assistance to you.   Following are the goals we discussed today:   Goals Addressed             This Visit's Progress    LCSW Plan of Care-Obtain Supportive Resources   On track    Activities and task to complete in order to accomplish goals.   Keep all upcoming appointments discussed today Continue with compliance of taking medication prescribed by Doctor Continue working with [facility] care team to assist with goals identified Continue implementing self-care strategies to promote relaxation         Our next appointment is by telephone on 06/17 at 11 AM  Please call the care guide team at [phone] if you need to cancel or reschedule your appointment.   If you are experiencing a Mental Health or Behavioral Health Crisis or need someone to talk to, please call the Suicide and Crisis Lifeline: 988 call 911   Patient verbalizes understanding of instructions and care plan provided today and agrees to view in MyChart. Active MyChart status and patient understanding of how to access instructions and care plan via MyChart confirmed with patient.     Jenel Lucks, MSW, LCSW [facility] Care Management [place]  [facility] [email or web address] Phone [phone] 11:04 PM

## 2023-03-25 NOTE — Patient Outreach (Signed)
  Care Coordination   Follow Up Visit Note   03/24/2023 Name: Felicia Thompson MRN: [id number] DOB: 1946-06-08  Felicia Thompson is a 77 y.o. year old female who sees Felicia Thompson, Felicia Patricia, MD for primary care. I spoke with  Felicia Thompson by phone today.  What matters to the patients health and wellness today?  Symptom Management    Goals Addressed             This Visit's Progress    LCSW Plan of Care-Obtain Supportive Resources   On track    Activities and task to complete in order to accomplish goals.   Keep all upcoming appointments discussed today Continue with compliance of taking medication prescribed by Doctor Continue working with [facility] care team to assist with goals identified Continue implementing self-care strategies to promote relaxation         SDOH assessments and interventions completed:  No     Care Coordination Interventions:  Yes, provided  Interventions Today    Flowsheet Row Most Recent Value  Chronic Disease   Chronic disease during today's visit Hypertension (HTN), Diabetes  General Interventions   General Interventions Discussed/Reviewed General Interventions Reviewed  Mental Health Interventions   Mental Health Discussed/Reviewed Mental Health Reviewed, Coping Strategies  [Patient is doing well. Visiting with family out of state promoting self-care]       Follow up plan: Follow up call scheduled for 8 weeks    Encounter Outcome:  Pt. Visit Completed   Jenel Lucks, MSW, LCSW [facility] Care Management [facility]  [facility] [email or web address] Phone [phone] 11:03 PM

## 2023-03-26 DIAGNOSIS — N186 End stage renal disease: Secondary | ICD-10-CM | POA: Diagnosis not present

## 2023-03-26 DIAGNOSIS — N2581 Secondary hyperparathyroidism of renal origin: Secondary | ICD-10-CM | POA: Diagnosis not present

## 2023-03-26 DIAGNOSIS — Z992 Dependence on renal dialysis: Secondary | ICD-10-CM | POA: Diagnosis not present

## 2023-03-27 DIAGNOSIS — Z992 Dependence on renal dialysis: Secondary | ICD-10-CM | POA: Diagnosis not present

## 2023-03-27 DIAGNOSIS — N2581 Secondary hyperparathyroidism of renal origin: Secondary | ICD-10-CM | POA: Diagnosis not present

## 2023-03-27 DIAGNOSIS — N186 End stage renal disease: Secondary | ICD-10-CM | POA: Diagnosis not present

## 2023-03-28 ENCOUNTER — Ambulatory Visit: Payer: Medicare Other | Admitting: Clinical

## 2023-03-28 DIAGNOSIS — Z992 Dependence on renal dialysis: Secondary | ICD-10-CM | POA: Diagnosis not present

## 2023-03-28 DIAGNOSIS — N186 End stage renal disease: Secondary | ICD-10-CM | POA: Diagnosis not present

## 2023-03-28 DIAGNOSIS — N2581 Secondary hyperparathyroidism of renal origin: Secondary | ICD-10-CM | POA: Diagnosis not present

## 2023-03-29 DIAGNOSIS — Z992 Dependence on renal dialysis: Secondary | ICD-10-CM | POA: Diagnosis not present

## 2023-03-29 DIAGNOSIS — N186 End stage renal disease: Secondary | ICD-10-CM | POA: Diagnosis not present

## 2023-03-29 DIAGNOSIS — N2581 Secondary hyperparathyroidism of renal origin: Secondary | ICD-10-CM | POA: Diagnosis not present

## 2023-03-30 DIAGNOSIS — N186 End stage renal disease: Secondary | ICD-10-CM | POA: Diagnosis not present

## 2023-03-30 DIAGNOSIS — N2581 Secondary hyperparathyroidism of renal origin: Secondary | ICD-10-CM | POA: Diagnosis not present

## 2023-03-30 DIAGNOSIS — Z992 Dependence on renal dialysis: Secondary | ICD-10-CM | POA: Diagnosis not present

## 2023-03-31 DIAGNOSIS — N186 End stage renal disease: Secondary | ICD-10-CM | POA: Diagnosis not present

## 2023-03-31 DIAGNOSIS — N2581 Secondary hyperparathyroidism of renal origin: Secondary | ICD-10-CM | POA: Diagnosis not present

## 2023-03-31 DIAGNOSIS — Z992 Dependence on renal dialysis: Secondary | ICD-10-CM | POA: Diagnosis not present

## 2023-03-31 DIAGNOSIS — G4733 Obstructive sleep apnea (adult) (pediatric): Secondary | ICD-10-CM | POA: Diagnosis not present

## 2023-04-01 DIAGNOSIS — N2581 Secondary hyperparathyroidism of renal origin: Secondary | ICD-10-CM | POA: Diagnosis not present

## 2023-04-01 DIAGNOSIS — Z992 Dependence on renal dialysis: Secondary | ICD-10-CM | POA: Diagnosis not present

## 2023-04-01 DIAGNOSIS — N186 End stage renal disease: Secondary | ICD-10-CM | POA: Diagnosis not present

## 2023-04-02 DIAGNOSIS — Z992 Dependence on renal dialysis: Secondary | ICD-10-CM | POA: Diagnosis not present

## 2023-04-02 DIAGNOSIS — E875 Hyperkalemia: Secondary | ICD-10-CM | POA: Diagnosis not present

## 2023-04-02 DIAGNOSIS — N2581 Secondary hyperparathyroidism of renal origin: Secondary | ICD-10-CM | POA: Diagnosis not present

## 2023-04-02 DIAGNOSIS — Z79899 Other long term (current) drug therapy: Secondary | ICD-10-CM | POA: Diagnosis not present

## 2023-04-02 DIAGNOSIS — D631 Anemia in chronic kidney disease: Secondary | ICD-10-CM | POA: Diagnosis not present

## 2023-04-02 DIAGNOSIS — N186 End stage renal disease: Secondary | ICD-10-CM | POA: Diagnosis not present

## 2023-04-02 DIAGNOSIS — D509 Iron deficiency anemia, unspecified: Secondary | ICD-10-CM | POA: Diagnosis not present

## 2023-04-02 DIAGNOSIS — K769 Liver disease, unspecified: Secondary | ICD-10-CM | POA: Diagnosis not present

## 2023-04-02 DIAGNOSIS — R82998 Other abnormal findings in urine: Secondary | ICD-10-CM | POA: Diagnosis not present

## 2023-04-03 DIAGNOSIS — D631 Anemia in chronic kidney disease: Secondary | ICD-10-CM | POA: Diagnosis not present

## 2023-04-03 DIAGNOSIS — E875 Hyperkalemia: Secondary | ICD-10-CM | POA: Diagnosis not present

## 2023-04-03 DIAGNOSIS — N2581 Secondary hyperparathyroidism of renal origin: Secondary | ICD-10-CM | POA: Diagnosis not present

## 2023-04-03 DIAGNOSIS — K769 Liver disease, unspecified: Secondary | ICD-10-CM | POA: Diagnosis not present

## 2023-04-03 DIAGNOSIS — Z992 Dependence on renal dialysis: Secondary | ICD-10-CM | POA: Diagnosis not present

## 2023-04-03 DIAGNOSIS — R82998 Other abnormal findings in urine: Secondary | ICD-10-CM | POA: Diagnosis not present

## 2023-04-03 DIAGNOSIS — N186 End stage renal disease: Secondary | ICD-10-CM | POA: Diagnosis not present

## 2023-04-03 DIAGNOSIS — Z79899 Other long term (current) drug therapy: Secondary | ICD-10-CM | POA: Diagnosis not present

## 2023-04-03 DIAGNOSIS — D509 Iron deficiency anemia, unspecified: Secondary | ICD-10-CM | POA: Diagnosis not present

## 2023-04-04 DIAGNOSIS — N186 End stage renal disease: Secondary | ICD-10-CM | POA: Diagnosis not present

## 2023-04-04 DIAGNOSIS — Z79899 Other long term (current) drug therapy: Secondary | ICD-10-CM | POA: Diagnosis not present

## 2023-04-04 DIAGNOSIS — D631 Anemia in chronic kidney disease: Secondary | ICD-10-CM | POA: Diagnosis not present

## 2023-04-04 DIAGNOSIS — E875 Hyperkalemia: Secondary | ICD-10-CM | POA: Diagnosis not present

## 2023-04-04 DIAGNOSIS — R82998 Other abnormal findings in urine: Secondary | ICD-10-CM | POA: Diagnosis not present

## 2023-04-04 DIAGNOSIS — N2581 Secondary hyperparathyroidism of renal origin: Secondary | ICD-10-CM | POA: Diagnosis not present

## 2023-04-04 DIAGNOSIS — Z992 Dependence on renal dialysis: Secondary | ICD-10-CM | POA: Diagnosis not present

## 2023-04-04 DIAGNOSIS — K769 Liver disease, unspecified: Secondary | ICD-10-CM | POA: Diagnosis not present

## 2023-04-04 DIAGNOSIS — D509 Iron deficiency anemia, unspecified: Secondary | ICD-10-CM | POA: Diagnosis not present

## 2023-04-05 DIAGNOSIS — N2581 Secondary hyperparathyroidism of renal origin: Secondary | ICD-10-CM | POA: Diagnosis not present

## 2023-04-05 DIAGNOSIS — N186 End stage renal disease: Secondary | ICD-10-CM | POA: Diagnosis not present

## 2023-04-05 DIAGNOSIS — R82998 Other abnormal findings in urine: Secondary | ICD-10-CM | POA: Diagnosis not present

## 2023-04-05 DIAGNOSIS — E875 Hyperkalemia: Secondary | ICD-10-CM | POA: Diagnosis not present

## 2023-04-05 DIAGNOSIS — Z79899 Other long term (current) drug therapy: Secondary | ICD-10-CM | POA: Diagnosis not present

## 2023-04-05 DIAGNOSIS — Z992 Dependence on renal dialysis: Secondary | ICD-10-CM | POA: Diagnosis not present

## 2023-04-05 DIAGNOSIS — K769 Liver disease, unspecified: Secondary | ICD-10-CM | POA: Diagnosis not present

## 2023-04-05 DIAGNOSIS — D631 Anemia in chronic kidney disease: Secondary | ICD-10-CM | POA: Diagnosis not present

## 2023-04-05 DIAGNOSIS — D509 Iron deficiency anemia, unspecified: Secondary | ICD-10-CM | POA: Diagnosis not present

## 2023-04-06 DIAGNOSIS — Z79899 Other long term (current) drug therapy: Secondary | ICD-10-CM | POA: Diagnosis not present

## 2023-04-06 DIAGNOSIS — K769 Liver disease, unspecified: Secondary | ICD-10-CM | POA: Diagnosis not present

## 2023-04-06 DIAGNOSIS — N186 End stage renal disease: Secondary | ICD-10-CM | POA: Diagnosis not present

## 2023-04-06 DIAGNOSIS — R82998 Other abnormal findings in urine: Secondary | ICD-10-CM | POA: Diagnosis not present

## 2023-04-06 DIAGNOSIS — N2581 Secondary hyperparathyroidism of renal origin: Secondary | ICD-10-CM | POA: Diagnosis not present

## 2023-04-06 DIAGNOSIS — Z992 Dependence on renal dialysis: Secondary | ICD-10-CM | POA: Diagnosis not present

## 2023-04-06 DIAGNOSIS — D631 Anemia in chronic kidney disease: Secondary | ICD-10-CM | POA: Diagnosis not present

## 2023-04-06 DIAGNOSIS — D509 Iron deficiency anemia, unspecified: Secondary | ICD-10-CM | POA: Diagnosis not present

## 2023-04-06 DIAGNOSIS — E875 Hyperkalemia: Secondary | ICD-10-CM | POA: Diagnosis not present

## 2023-04-07 DIAGNOSIS — Z79899 Other long term (current) drug therapy: Secondary | ICD-10-CM | POA: Diagnosis not present

## 2023-04-07 DIAGNOSIS — Z992 Dependence on renal dialysis: Secondary | ICD-10-CM | POA: Diagnosis not present

## 2023-04-07 DIAGNOSIS — R82998 Other abnormal findings in urine: Secondary | ICD-10-CM | POA: Diagnosis not present

## 2023-04-07 DIAGNOSIS — E875 Hyperkalemia: Secondary | ICD-10-CM | POA: Diagnosis not present

## 2023-04-07 DIAGNOSIS — N186 End stage renal disease: Secondary | ICD-10-CM | POA: Diagnosis not present

## 2023-04-07 DIAGNOSIS — D509 Iron deficiency anemia, unspecified: Secondary | ICD-10-CM | POA: Diagnosis not present

## 2023-04-07 DIAGNOSIS — D631 Anemia in chronic kidney disease: Secondary | ICD-10-CM | POA: Diagnosis not present

## 2023-04-07 DIAGNOSIS — K769 Liver disease, unspecified: Secondary | ICD-10-CM | POA: Diagnosis not present

## 2023-04-07 DIAGNOSIS — N2581 Secondary hyperparathyroidism of renal origin: Secondary | ICD-10-CM | POA: Diagnosis not present

## 2023-04-08 DIAGNOSIS — Z992 Dependence on renal dialysis: Secondary | ICD-10-CM | POA: Diagnosis not present

## 2023-04-08 DIAGNOSIS — N186 End stage renal disease: Secondary | ICD-10-CM | POA: Diagnosis not present

## 2023-04-08 DIAGNOSIS — K769 Liver disease, unspecified: Secondary | ICD-10-CM | POA: Diagnosis not present

## 2023-04-08 DIAGNOSIS — N2581 Secondary hyperparathyroidism of renal origin: Secondary | ICD-10-CM | POA: Diagnosis not present

## 2023-04-08 DIAGNOSIS — E875 Hyperkalemia: Secondary | ICD-10-CM | POA: Diagnosis not present

## 2023-04-08 DIAGNOSIS — D631 Anemia in chronic kidney disease: Secondary | ICD-10-CM | POA: Diagnosis not present

## 2023-04-08 DIAGNOSIS — Z79899 Other long term (current) drug therapy: Secondary | ICD-10-CM | POA: Diagnosis not present

## 2023-04-08 DIAGNOSIS — D509 Iron deficiency anemia, unspecified: Secondary | ICD-10-CM | POA: Diagnosis not present

## 2023-04-08 DIAGNOSIS — R82998 Other abnormal findings in urine: Secondary | ICD-10-CM | POA: Diagnosis not present

## 2023-04-09 DIAGNOSIS — Z992 Dependence on renal dialysis: Secondary | ICD-10-CM | POA: Diagnosis not present

## 2023-04-09 DIAGNOSIS — D631 Anemia in chronic kidney disease: Secondary | ICD-10-CM | POA: Diagnosis not present

## 2023-04-09 DIAGNOSIS — D509 Iron deficiency anemia, unspecified: Secondary | ICD-10-CM | POA: Diagnosis not present

## 2023-04-09 DIAGNOSIS — E875 Hyperkalemia: Secondary | ICD-10-CM | POA: Diagnosis not present

## 2023-04-09 DIAGNOSIS — Z79899 Other long term (current) drug therapy: Secondary | ICD-10-CM | POA: Diagnosis not present

## 2023-04-09 DIAGNOSIS — R82998 Other abnormal findings in urine: Secondary | ICD-10-CM | POA: Diagnosis not present

## 2023-04-09 DIAGNOSIS — N186 End stage renal disease: Secondary | ICD-10-CM | POA: Diagnosis not present

## 2023-04-09 DIAGNOSIS — K769 Liver disease, unspecified: Secondary | ICD-10-CM | POA: Diagnosis not present

## 2023-04-09 DIAGNOSIS — N2581 Secondary hyperparathyroidism of renal origin: Secondary | ICD-10-CM | POA: Diagnosis not present

## 2023-04-10 DIAGNOSIS — K769 Liver disease, unspecified: Secondary | ICD-10-CM | POA: Diagnosis not present

## 2023-04-10 DIAGNOSIS — D631 Anemia in chronic kidney disease: Secondary | ICD-10-CM | POA: Diagnosis not present

## 2023-04-10 DIAGNOSIS — D509 Iron deficiency anemia, unspecified: Secondary | ICD-10-CM | POA: Diagnosis not present

## 2023-04-10 DIAGNOSIS — N2581 Secondary hyperparathyroidism of renal origin: Secondary | ICD-10-CM | POA: Diagnosis not present

## 2023-04-10 DIAGNOSIS — R82998 Other abnormal findings in urine: Secondary | ICD-10-CM | POA: Diagnosis not present

## 2023-04-10 DIAGNOSIS — E875 Hyperkalemia: Secondary | ICD-10-CM | POA: Diagnosis not present

## 2023-04-10 DIAGNOSIS — Z992 Dependence on renal dialysis: Secondary | ICD-10-CM | POA: Diagnosis not present

## 2023-04-10 DIAGNOSIS — Z79899 Other long term (current) drug therapy: Secondary | ICD-10-CM | POA: Diagnosis not present

## 2023-04-10 DIAGNOSIS — N186 End stage renal disease: Secondary | ICD-10-CM | POA: Diagnosis not present

## 2023-04-11 DIAGNOSIS — E875 Hyperkalemia: Secondary | ICD-10-CM | POA: Diagnosis not present

## 2023-04-11 DIAGNOSIS — R82998 Other abnormal findings in urine: Secondary | ICD-10-CM | POA: Diagnosis not present

## 2023-04-11 DIAGNOSIS — D631 Anemia in chronic kidney disease: Secondary | ICD-10-CM | POA: Diagnosis not present

## 2023-04-11 DIAGNOSIS — D509 Iron deficiency anemia, unspecified: Secondary | ICD-10-CM | POA: Diagnosis not present

## 2023-04-11 DIAGNOSIS — N186 End stage renal disease: Secondary | ICD-10-CM | POA: Diagnosis not present

## 2023-04-11 DIAGNOSIS — K769 Liver disease, unspecified: Secondary | ICD-10-CM | POA: Diagnosis not present

## 2023-04-11 DIAGNOSIS — N2581 Secondary hyperparathyroidism of renal origin: Secondary | ICD-10-CM | POA: Diagnosis not present

## 2023-04-11 DIAGNOSIS — Z79899 Other long term (current) drug therapy: Secondary | ICD-10-CM | POA: Diagnosis not present

## 2023-04-11 DIAGNOSIS — Z992 Dependence on renal dialysis: Secondary | ICD-10-CM | POA: Diagnosis not present

## 2023-04-12 DIAGNOSIS — R82998 Other abnormal findings in urine: Secondary | ICD-10-CM | POA: Diagnosis not present

## 2023-04-12 DIAGNOSIS — K769 Liver disease, unspecified: Secondary | ICD-10-CM | POA: Diagnosis not present

## 2023-04-12 DIAGNOSIS — D631 Anemia in chronic kidney disease: Secondary | ICD-10-CM | POA: Diagnosis not present

## 2023-04-12 DIAGNOSIS — Z79899 Other long term (current) drug therapy: Secondary | ICD-10-CM | POA: Diagnosis not present

## 2023-04-12 DIAGNOSIS — N2581 Secondary hyperparathyroidism of renal origin: Secondary | ICD-10-CM | POA: Diagnosis not present

## 2023-04-12 DIAGNOSIS — D509 Iron deficiency anemia, unspecified: Secondary | ICD-10-CM | POA: Diagnosis not present

## 2023-04-12 DIAGNOSIS — N186 End stage renal disease: Secondary | ICD-10-CM | POA: Diagnosis not present

## 2023-04-12 DIAGNOSIS — Z992 Dependence on renal dialysis: Secondary | ICD-10-CM | POA: Diagnosis not present

## 2023-04-12 DIAGNOSIS — E875 Hyperkalemia: Secondary | ICD-10-CM | POA: Diagnosis not present

## 2023-04-13 DIAGNOSIS — D509 Iron deficiency anemia, unspecified: Secondary | ICD-10-CM | POA: Diagnosis not present

## 2023-04-13 DIAGNOSIS — K769 Liver disease, unspecified: Secondary | ICD-10-CM | POA: Diagnosis not present

## 2023-04-13 DIAGNOSIS — E875 Hyperkalemia: Secondary | ICD-10-CM | POA: Diagnosis not present

## 2023-04-13 DIAGNOSIS — N2581 Secondary hyperparathyroidism of renal origin: Secondary | ICD-10-CM | POA: Diagnosis not present

## 2023-04-13 DIAGNOSIS — Z992 Dependence on renal dialysis: Secondary | ICD-10-CM | POA: Diagnosis not present

## 2023-04-13 DIAGNOSIS — N186 End stage renal disease: Secondary | ICD-10-CM | POA: Diagnosis not present

## 2023-04-13 DIAGNOSIS — Z79899 Other long term (current) drug therapy: Secondary | ICD-10-CM | POA: Diagnosis not present

## 2023-04-13 DIAGNOSIS — R82998 Other abnormal findings in urine: Secondary | ICD-10-CM | POA: Diagnosis not present

## 2023-04-13 DIAGNOSIS — D631 Anemia in chronic kidney disease: Secondary | ICD-10-CM | POA: Diagnosis not present

## 2023-04-14 DIAGNOSIS — N186 End stage renal disease: Secondary | ICD-10-CM | POA: Diagnosis not present

## 2023-04-14 DIAGNOSIS — Z992 Dependence on renal dialysis: Secondary | ICD-10-CM | POA: Diagnosis not present

## 2023-04-14 DIAGNOSIS — D509 Iron deficiency anemia, unspecified: Secondary | ICD-10-CM | POA: Diagnosis not present

## 2023-04-14 DIAGNOSIS — E875 Hyperkalemia: Secondary | ICD-10-CM | POA: Diagnosis not present

## 2023-04-14 DIAGNOSIS — Z79899 Other long term (current) drug therapy: Secondary | ICD-10-CM | POA: Diagnosis not present

## 2023-04-14 DIAGNOSIS — D631 Anemia in chronic kidney disease: Secondary | ICD-10-CM | POA: Diagnosis not present

## 2023-04-14 DIAGNOSIS — K769 Liver disease, unspecified: Secondary | ICD-10-CM | POA: Diagnosis not present

## 2023-04-14 DIAGNOSIS — N2581 Secondary hyperparathyroidism of renal origin: Secondary | ICD-10-CM | POA: Diagnosis not present

## 2023-04-14 DIAGNOSIS — R82998 Other abnormal findings in urine: Secondary | ICD-10-CM | POA: Diagnosis not present

## 2023-04-15 DIAGNOSIS — K769 Liver disease, unspecified: Secondary | ICD-10-CM | POA: Diagnosis not present

## 2023-04-15 DIAGNOSIS — N186 End stage renal disease: Secondary | ICD-10-CM | POA: Diagnosis not present

## 2023-04-15 DIAGNOSIS — N2581 Secondary hyperparathyroidism of renal origin: Secondary | ICD-10-CM | POA: Diagnosis not present

## 2023-04-15 DIAGNOSIS — Z79899 Other long term (current) drug therapy: Secondary | ICD-10-CM | POA: Diagnosis not present

## 2023-04-15 DIAGNOSIS — D509 Iron deficiency anemia, unspecified: Secondary | ICD-10-CM | POA: Diagnosis not present

## 2023-04-15 DIAGNOSIS — E875 Hyperkalemia: Secondary | ICD-10-CM | POA: Diagnosis not present

## 2023-04-15 DIAGNOSIS — Z992 Dependence on renal dialysis: Secondary | ICD-10-CM | POA: Diagnosis not present

## 2023-04-15 DIAGNOSIS — D631 Anemia in chronic kidney disease: Secondary | ICD-10-CM | POA: Diagnosis not present

## 2023-04-15 DIAGNOSIS — R82998 Other abnormal findings in urine: Secondary | ICD-10-CM | POA: Diagnosis not present

## 2023-04-16 DIAGNOSIS — Z992 Dependence on renal dialysis: Secondary | ICD-10-CM | POA: Diagnosis not present

## 2023-04-16 DIAGNOSIS — E875 Hyperkalemia: Secondary | ICD-10-CM | POA: Diagnosis not present

## 2023-04-16 DIAGNOSIS — Z79899 Other long term (current) drug therapy: Secondary | ICD-10-CM | POA: Diagnosis not present

## 2023-04-16 DIAGNOSIS — R82998 Other abnormal findings in urine: Secondary | ICD-10-CM | POA: Diagnosis not present

## 2023-04-16 DIAGNOSIS — K769 Liver disease, unspecified: Secondary | ICD-10-CM | POA: Diagnosis not present

## 2023-04-16 DIAGNOSIS — D509 Iron deficiency anemia, unspecified: Secondary | ICD-10-CM | POA: Diagnosis not present

## 2023-04-16 DIAGNOSIS — N2581 Secondary hyperparathyroidism of renal origin: Secondary | ICD-10-CM | POA: Diagnosis not present

## 2023-04-16 DIAGNOSIS — D631 Anemia in chronic kidney disease: Secondary | ICD-10-CM | POA: Diagnosis not present

## 2023-04-16 DIAGNOSIS — N186 End stage renal disease: Secondary | ICD-10-CM | POA: Diagnosis not present

## 2023-04-17 DIAGNOSIS — D509 Iron deficiency anemia, unspecified: Secondary | ICD-10-CM | POA: Diagnosis not present

## 2023-04-17 DIAGNOSIS — N2581 Secondary hyperparathyroidism of renal origin: Secondary | ICD-10-CM | POA: Diagnosis not present

## 2023-04-17 DIAGNOSIS — K769 Liver disease, unspecified: Secondary | ICD-10-CM | POA: Diagnosis not present

## 2023-04-17 DIAGNOSIS — Z992 Dependence on renal dialysis: Secondary | ICD-10-CM | POA: Diagnosis not present

## 2023-04-17 DIAGNOSIS — N186 End stage renal disease: Secondary | ICD-10-CM | POA: Diagnosis not present

## 2023-04-17 DIAGNOSIS — D631 Anemia in chronic kidney disease: Secondary | ICD-10-CM | POA: Diagnosis not present

## 2023-04-17 DIAGNOSIS — Z79899 Other long term (current) drug therapy: Secondary | ICD-10-CM | POA: Diagnosis not present

## 2023-04-17 DIAGNOSIS — E875 Hyperkalemia: Secondary | ICD-10-CM | POA: Diagnosis not present

## 2023-04-17 DIAGNOSIS — R82998 Other abnormal findings in urine: Secondary | ICD-10-CM | POA: Diagnosis not present

## 2023-04-18 DIAGNOSIS — N186 End stage renal disease: Secondary | ICD-10-CM | POA: Diagnosis not present

## 2023-04-18 DIAGNOSIS — E875 Hyperkalemia: Secondary | ICD-10-CM | POA: Diagnosis not present

## 2023-04-18 DIAGNOSIS — D509 Iron deficiency anemia, unspecified: Secondary | ICD-10-CM | POA: Diagnosis not present

## 2023-04-18 DIAGNOSIS — K769 Liver disease, unspecified: Secondary | ICD-10-CM | POA: Diagnosis not present

## 2023-04-18 DIAGNOSIS — R82998 Other abnormal findings in urine: Secondary | ICD-10-CM | POA: Diagnosis not present

## 2023-04-18 DIAGNOSIS — Z79899 Other long term (current) drug therapy: Secondary | ICD-10-CM | POA: Diagnosis not present

## 2023-04-18 DIAGNOSIS — N2581 Secondary hyperparathyroidism of renal origin: Secondary | ICD-10-CM | POA: Diagnosis not present

## 2023-04-18 DIAGNOSIS — D631 Anemia in chronic kidney disease: Secondary | ICD-10-CM | POA: Diagnosis not present

## 2023-04-18 DIAGNOSIS — Z992 Dependence on renal dialysis: Secondary | ICD-10-CM | POA: Diagnosis not present

## 2023-04-19 DIAGNOSIS — N186 End stage renal disease: Secondary | ICD-10-CM | POA: Diagnosis not present

## 2023-04-19 DIAGNOSIS — D631 Anemia in chronic kidney disease: Secondary | ICD-10-CM | POA: Diagnosis not present

## 2023-04-19 DIAGNOSIS — E875 Hyperkalemia: Secondary | ICD-10-CM | POA: Diagnosis not present

## 2023-04-19 DIAGNOSIS — D509 Iron deficiency anemia, unspecified: Secondary | ICD-10-CM | POA: Diagnosis not present

## 2023-04-19 DIAGNOSIS — R82998 Other abnormal findings in urine: Secondary | ICD-10-CM | POA: Diagnosis not present

## 2023-04-19 DIAGNOSIS — K769 Liver disease, unspecified: Secondary | ICD-10-CM | POA: Diagnosis not present

## 2023-04-19 DIAGNOSIS — Z79899 Other long term (current) drug therapy: Secondary | ICD-10-CM | POA: Diagnosis not present

## 2023-04-19 DIAGNOSIS — Z992 Dependence on renal dialysis: Secondary | ICD-10-CM | POA: Diagnosis not present

## 2023-04-19 DIAGNOSIS — N2581 Secondary hyperparathyroidism of renal origin: Secondary | ICD-10-CM | POA: Diagnosis not present

## 2023-04-20 DIAGNOSIS — Z992 Dependence on renal dialysis: Secondary | ICD-10-CM | POA: Diagnosis not present

## 2023-04-20 DIAGNOSIS — R82998 Other abnormal findings in urine: Secondary | ICD-10-CM | POA: Diagnosis not present

## 2023-04-20 DIAGNOSIS — D631 Anemia in chronic kidney disease: Secondary | ICD-10-CM | POA: Diagnosis not present

## 2023-04-20 DIAGNOSIS — D509 Iron deficiency anemia, unspecified: Secondary | ICD-10-CM | POA: Diagnosis not present

## 2023-04-20 DIAGNOSIS — N186 End stage renal disease: Secondary | ICD-10-CM | POA: Diagnosis not present

## 2023-04-20 DIAGNOSIS — N2581 Secondary hyperparathyroidism of renal origin: Secondary | ICD-10-CM | POA: Diagnosis not present

## 2023-04-20 DIAGNOSIS — K769 Liver disease, unspecified: Secondary | ICD-10-CM | POA: Diagnosis not present

## 2023-04-20 DIAGNOSIS — E875 Hyperkalemia: Secondary | ICD-10-CM | POA: Diagnosis not present

## 2023-04-20 DIAGNOSIS — Z79899 Other long term (current) drug therapy: Secondary | ICD-10-CM | POA: Diagnosis not present

## 2023-04-21 DIAGNOSIS — N186 End stage renal disease: Secondary | ICD-10-CM | POA: Diagnosis not present

## 2023-04-21 DIAGNOSIS — D631 Anemia in chronic kidney disease: Secondary | ICD-10-CM | POA: Diagnosis not present

## 2023-04-21 DIAGNOSIS — N2581 Secondary hyperparathyroidism of renal origin: Secondary | ICD-10-CM | POA: Diagnosis not present

## 2023-04-21 DIAGNOSIS — R82998 Other abnormal findings in urine: Secondary | ICD-10-CM | POA: Diagnosis not present

## 2023-04-21 DIAGNOSIS — Z992 Dependence on renal dialysis: Secondary | ICD-10-CM | POA: Diagnosis not present

## 2023-04-21 DIAGNOSIS — Z79899 Other long term (current) drug therapy: Secondary | ICD-10-CM | POA: Diagnosis not present

## 2023-04-21 DIAGNOSIS — D509 Iron deficiency anemia, unspecified: Secondary | ICD-10-CM | POA: Diagnosis not present

## 2023-04-21 DIAGNOSIS — K769 Liver disease, unspecified: Secondary | ICD-10-CM | POA: Diagnosis not present

## 2023-04-21 DIAGNOSIS — E875 Hyperkalemia: Secondary | ICD-10-CM | POA: Diagnosis not present

## 2023-04-22 DIAGNOSIS — Z992 Dependence on renal dialysis: Secondary | ICD-10-CM | POA: Diagnosis not present

## 2023-04-22 DIAGNOSIS — E875 Hyperkalemia: Secondary | ICD-10-CM | POA: Diagnosis not present

## 2023-04-22 DIAGNOSIS — N186 End stage renal disease: Secondary | ICD-10-CM | POA: Diagnosis not present

## 2023-04-22 DIAGNOSIS — D509 Iron deficiency anemia, unspecified: Secondary | ICD-10-CM | POA: Diagnosis not present

## 2023-04-22 DIAGNOSIS — Z79899 Other long term (current) drug therapy: Secondary | ICD-10-CM | POA: Diagnosis not present

## 2023-04-22 DIAGNOSIS — D631 Anemia in chronic kidney disease: Secondary | ICD-10-CM | POA: Diagnosis not present

## 2023-04-22 DIAGNOSIS — N2581 Secondary hyperparathyroidism of renal origin: Secondary | ICD-10-CM | POA: Diagnosis not present

## 2023-04-22 DIAGNOSIS — K769 Liver disease, unspecified: Secondary | ICD-10-CM | POA: Diagnosis not present

## 2023-04-22 DIAGNOSIS — R82998 Other abnormal findings in urine: Secondary | ICD-10-CM | POA: Diagnosis not present

## 2023-04-23 DIAGNOSIS — K769 Liver disease, unspecified: Secondary | ICD-10-CM | POA: Diagnosis not present

## 2023-04-23 DIAGNOSIS — N2581 Secondary hyperparathyroidism of renal origin: Secondary | ICD-10-CM | POA: Diagnosis not present

## 2023-04-23 DIAGNOSIS — N186 End stage renal disease: Secondary | ICD-10-CM | POA: Diagnosis not present

## 2023-04-23 DIAGNOSIS — Z79899 Other long term (current) drug therapy: Secondary | ICD-10-CM | POA: Diagnosis not present

## 2023-04-23 DIAGNOSIS — E875 Hyperkalemia: Secondary | ICD-10-CM | POA: Diagnosis not present

## 2023-04-23 DIAGNOSIS — D631 Anemia in chronic kidney disease: Secondary | ICD-10-CM | POA: Diagnosis not present

## 2023-04-23 DIAGNOSIS — D509 Iron deficiency anemia, unspecified: Secondary | ICD-10-CM | POA: Diagnosis not present

## 2023-04-23 DIAGNOSIS — Z992 Dependence on renal dialysis: Secondary | ICD-10-CM | POA: Diagnosis not present

## 2023-04-23 DIAGNOSIS — R82998 Other abnormal findings in urine: Secondary | ICD-10-CM | POA: Diagnosis not present

## 2023-04-24 DIAGNOSIS — N2581 Secondary hyperparathyroidism of renal origin: Secondary | ICD-10-CM | POA: Diagnosis not present

## 2023-04-24 DIAGNOSIS — N186 End stage renal disease: Secondary | ICD-10-CM | POA: Diagnosis not present

## 2023-04-24 DIAGNOSIS — K769 Liver disease, unspecified: Secondary | ICD-10-CM | POA: Diagnosis not present

## 2023-04-24 DIAGNOSIS — R82998 Other abnormal findings in urine: Secondary | ICD-10-CM | POA: Diagnosis not present

## 2023-04-24 DIAGNOSIS — D631 Anemia in chronic kidney disease: Secondary | ICD-10-CM | POA: Diagnosis not present

## 2023-04-24 DIAGNOSIS — Z79899 Other long term (current) drug therapy: Secondary | ICD-10-CM | POA: Diagnosis not present

## 2023-04-24 DIAGNOSIS — E875 Hyperkalemia: Secondary | ICD-10-CM | POA: Diagnosis not present

## 2023-04-24 DIAGNOSIS — D509 Iron deficiency anemia, unspecified: Secondary | ICD-10-CM | POA: Diagnosis not present

## 2023-04-24 DIAGNOSIS — Z992 Dependence on renal dialysis: Secondary | ICD-10-CM | POA: Diagnosis not present

## 2023-04-25 DIAGNOSIS — R82998 Other abnormal findings in urine: Secondary | ICD-10-CM | POA: Diagnosis not present

## 2023-04-25 DIAGNOSIS — K769 Liver disease, unspecified: Secondary | ICD-10-CM | POA: Diagnosis not present

## 2023-04-25 DIAGNOSIS — Z79899 Other long term (current) drug therapy: Secondary | ICD-10-CM | POA: Diagnosis not present

## 2023-04-25 DIAGNOSIS — D631 Anemia in chronic kidney disease: Secondary | ICD-10-CM | POA: Diagnosis not present

## 2023-04-25 DIAGNOSIS — N186 End stage renal disease: Secondary | ICD-10-CM | POA: Diagnosis not present

## 2023-04-25 DIAGNOSIS — D509 Iron deficiency anemia, unspecified: Secondary | ICD-10-CM | POA: Diagnosis not present

## 2023-04-25 DIAGNOSIS — N2581 Secondary hyperparathyroidism of renal origin: Secondary | ICD-10-CM | POA: Diagnosis not present

## 2023-04-25 DIAGNOSIS — E875 Hyperkalemia: Secondary | ICD-10-CM | POA: Diagnosis not present

## 2023-04-25 DIAGNOSIS — Z992 Dependence on renal dialysis: Secondary | ICD-10-CM | POA: Diagnosis not present

## 2023-04-26 DIAGNOSIS — Z992 Dependence on renal dialysis: Secondary | ICD-10-CM | POA: Diagnosis not present

## 2023-04-26 DIAGNOSIS — N186 End stage renal disease: Secondary | ICD-10-CM | POA: Diagnosis not present

## 2023-04-26 DIAGNOSIS — D631 Anemia in chronic kidney disease: Secondary | ICD-10-CM | POA: Diagnosis not present

## 2023-04-26 DIAGNOSIS — R82998 Other abnormal findings in urine: Secondary | ICD-10-CM | POA: Diagnosis not present

## 2023-04-26 DIAGNOSIS — Z79899 Other long term (current) drug therapy: Secondary | ICD-10-CM | POA: Diagnosis not present

## 2023-04-26 DIAGNOSIS — D509 Iron deficiency anemia, unspecified: Secondary | ICD-10-CM | POA: Diagnosis not present

## 2023-04-26 DIAGNOSIS — N2581 Secondary hyperparathyroidism of renal origin: Secondary | ICD-10-CM | POA: Diagnosis not present

## 2023-04-26 DIAGNOSIS — K769 Liver disease, unspecified: Secondary | ICD-10-CM | POA: Diagnosis not present

## 2023-04-26 DIAGNOSIS — E875 Hyperkalemia: Secondary | ICD-10-CM | POA: Diagnosis not present

## 2023-04-27 DIAGNOSIS — N2581 Secondary hyperparathyroidism of renal origin: Secondary | ICD-10-CM | POA: Diagnosis not present

## 2023-04-27 DIAGNOSIS — N186 End stage renal disease: Secondary | ICD-10-CM | POA: Diagnosis not present

## 2023-04-27 DIAGNOSIS — D631 Anemia in chronic kidney disease: Secondary | ICD-10-CM | POA: Diagnosis not present

## 2023-04-27 DIAGNOSIS — Z992 Dependence on renal dialysis: Secondary | ICD-10-CM | POA: Diagnosis not present

## 2023-04-27 DIAGNOSIS — R82998 Other abnormal findings in urine: Secondary | ICD-10-CM | POA: Diagnosis not present

## 2023-04-27 DIAGNOSIS — D509 Iron deficiency anemia, unspecified: Secondary | ICD-10-CM | POA: Diagnosis not present

## 2023-04-27 DIAGNOSIS — K769 Liver disease, unspecified: Secondary | ICD-10-CM | POA: Diagnosis not present

## 2023-04-27 DIAGNOSIS — Z79899 Other long term (current) drug therapy: Secondary | ICD-10-CM | POA: Diagnosis not present

## 2023-04-27 DIAGNOSIS — E875 Hyperkalemia: Secondary | ICD-10-CM | POA: Diagnosis not present

## 2023-04-28 DIAGNOSIS — R82998 Other abnormal findings in urine: Secondary | ICD-10-CM | POA: Diagnosis not present

## 2023-04-28 DIAGNOSIS — N2581 Secondary hyperparathyroidism of renal origin: Secondary | ICD-10-CM | POA: Diagnosis not present

## 2023-04-28 DIAGNOSIS — D631 Anemia in chronic kidney disease: Secondary | ICD-10-CM | POA: Diagnosis not present

## 2023-04-28 DIAGNOSIS — K769 Liver disease, unspecified: Secondary | ICD-10-CM | POA: Diagnosis not present

## 2023-04-28 DIAGNOSIS — D509 Iron deficiency anemia, unspecified: Secondary | ICD-10-CM | POA: Diagnosis not present

## 2023-04-28 DIAGNOSIS — Z992 Dependence on renal dialysis: Secondary | ICD-10-CM | POA: Diagnosis not present

## 2023-04-28 DIAGNOSIS — N186 End stage renal disease: Secondary | ICD-10-CM | POA: Diagnosis not present

## 2023-04-28 DIAGNOSIS — E875 Hyperkalemia: Secondary | ICD-10-CM | POA: Diagnosis not present

## 2023-04-28 DIAGNOSIS — Z79899 Other long term (current) drug therapy: Secondary | ICD-10-CM | POA: Diagnosis not present

## 2023-04-29 DIAGNOSIS — N2581 Secondary hyperparathyroidism of renal origin: Secondary | ICD-10-CM | POA: Diagnosis not present

## 2023-04-29 DIAGNOSIS — K769 Liver disease, unspecified: Secondary | ICD-10-CM | POA: Diagnosis not present

## 2023-04-29 DIAGNOSIS — Z79899 Other long term (current) drug therapy: Secondary | ICD-10-CM | POA: Diagnosis not present

## 2023-04-29 DIAGNOSIS — Z992 Dependence on renal dialysis: Secondary | ICD-10-CM | POA: Diagnosis not present

## 2023-04-29 DIAGNOSIS — D509 Iron deficiency anemia, unspecified: Secondary | ICD-10-CM | POA: Diagnosis not present

## 2023-04-29 DIAGNOSIS — N186 End stage renal disease: Secondary | ICD-10-CM | POA: Diagnosis not present

## 2023-04-29 DIAGNOSIS — R82998 Other abnormal findings in urine: Secondary | ICD-10-CM | POA: Diagnosis not present

## 2023-04-29 DIAGNOSIS — D631 Anemia in chronic kidney disease: Secondary | ICD-10-CM | POA: Diagnosis not present

## 2023-04-29 DIAGNOSIS — E875 Hyperkalemia: Secondary | ICD-10-CM | POA: Diagnosis not present

## 2023-04-30 DIAGNOSIS — D631 Anemia in chronic kidney disease: Secondary | ICD-10-CM | POA: Diagnosis not present

## 2023-04-30 DIAGNOSIS — N186 End stage renal disease: Secondary | ICD-10-CM | POA: Diagnosis not present

## 2023-04-30 DIAGNOSIS — D509 Iron deficiency anemia, unspecified: Secondary | ICD-10-CM | POA: Diagnosis not present

## 2023-04-30 DIAGNOSIS — R82998 Other abnormal findings in urine: Secondary | ICD-10-CM | POA: Diagnosis not present

## 2023-04-30 DIAGNOSIS — Z79899 Other long term (current) drug therapy: Secondary | ICD-10-CM | POA: Diagnosis not present

## 2023-04-30 DIAGNOSIS — N2581 Secondary hyperparathyroidism of renal origin: Secondary | ICD-10-CM | POA: Diagnosis not present

## 2023-04-30 DIAGNOSIS — Z992 Dependence on renal dialysis: Secondary | ICD-10-CM | POA: Diagnosis not present

## 2023-04-30 DIAGNOSIS — K769 Liver disease, unspecified: Secondary | ICD-10-CM | POA: Diagnosis not present

## 2023-04-30 DIAGNOSIS — E875 Hyperkalemia: Secondary | ICD-10-CM | POA: Diagnosis not present

## 2023-05-01 DIAGNOSIS — E875 Hyperkalemia: Secondary | ICD-10-CM | POA: Diagnosis not present

## 2023-05-01 DIAGNOSIS — R82998 Other abnormal findings in urine: Secondary | ICD-10-CM | POA: Diagnosis not present

## 2023-05-01 DIAGNOSIS — N186 End stage renal disease: Secondary | ICD-10-CM | POA: Diagnosis not present

## 2023-05-01 DIAGNOSIS — Z79899 Other long term (current) drug therapy: Secondary | ICD-10-CM | POA: Diagnosis not present

## 2023-05-01 DIAGNOSIS — N2581 Secondary hyperparathyroidism of renal origin: Secondary | ICD-10-CM | POA: Diagnosis not present

## 2023-05-01 DIAGNOSIS — K769 Liver disease, unspecified: Secondary | ICD-10-CM | POA: Diagnosis not present

## 2023-05-01 DIAGNOSIS — Z992 Dependence on renal dialysis: Secondary | ICD-10-CM | POA: Diagnosis not present

## 2023-05-01 DIAGNOSIS — D631 Anemia in chronic kidney disease: Secondary | ICD-10-CM | POA: Diagnosis not present

## 2023-05-01 DIAGNOSIS — D509 Iron deficiency anemia, unspecified: Secondary | ICD-10-CM | POA: Diagnosis not present

## 2023-05-02 DIAGNOSIS — R82998 Other abnormal findings in urine: Secondary | ICD-10-CM | POA: Diagnosis not present

## 2023-05-02 DIAGNOSIS — Z992 Dependence on renal dialysis: Secondary | ICD-10-CM | POA: Diagnosis not present

## 2023-05-02 DIAGNOSIS — D631 Anemia in chronic kidney disease: Secondary | ICD-10-CM | POA: Diagnosis not present

## 2023-05-02 DIAGNOSIS — D509 Iron deficiency anemia, unspecified: Secondary | ICD-10-CM | POA: Diagnosis not present

## 2023-05-02 DIAGNOSIS — N186 End stage renal disease: Secondary | ICD-10-CM | POA: Diagnosis not present

## 2023-05-02 DIAGNOSIS — N2581 Secondary hyperparathyroidism of renal origin: Secondary | ICD-10-CM | POA: Diagnosis not present

## 2023-05-02 DIAGNOSIS — Z79899 Other long term (current) drug therapy: Secondary | ICD-10-CM | POA: Diagnosis not present

## 2023-05-02 DIAGNOSIS — K769 Liver disease, unspecified: Secondary | ICD-10-CM | POA: Diagnosis not present

## 2023-05-02 DIAGNOSIS — E875 Hyperkalemia: Secondary | ICD-10-CM | POA: Diagnosis not present

## 2023-05-09 DIAGNOSIS — G4733 Obstructive sleep apnea (adult) (pediatric): Secondary | ICD-10-CM | POA: Diagnosis not present

## 2023-05-19 ENCOUNTER — Telehealth: Payer: Self-pay | Admitting: Licensed Clinical Social Worker

## 2023-05-19 ENCOUNTER — Encounter: Payer: Self-pay | Admitting: Licensed Clinical Social Worker

## 2023-05-19 NOTE — Patient Outreach (Signed)
  Care Coordination   05/19/2023 Name: Felicia Thompson MRN: [id number] DOB: Jan 10, 1946   Care Coordination Outreach Attempts:  An unsuccessful telephone outreach was attempted for a scheduled appointment today.  Follow Up Plan:  Additional outreach attempts will be made to offer the patient care coordination information and services.   Encounter Outcome:  No Answer   Care Coordination Interventions:  No, not indicated    Jenel Lucks, MSW, LCSW [facility] Care Management [place]  [facility] [email or web address] Phone [phone] 5:25 PM

## 2023-07-13 NOTE — Hospice (Signed)
 SN for routine visit. Patient lying in hospital bed with family present upon arrival. Patient drowsy and unresponsive verbally to questions and commands. Resp even and unlabored on RA. No s/sx of pain or discomfort. No SOB noted. Patient anuric. Only taking in fluids PO for several days. POC and medications reviewed with family. Family denies needs at this time. Emotional support and encouragement provided.

## 2023-07-31 IMAGING — CT CT HEAD W/O CM
3 series · 15 of 47 positions shown, 18 images · non-contrast
Comparison: None.

CLINICAL DATA: Dizziness, patient becoming rigid and falling

EXAM:
CT HEAD WITHOUT CONTRAST
TECHNIQUE: Contiguous axial images were obtained from the base of the skull
through the vertex without intravenous contrast.

[Series 2: head wo · axial · 0.44mm/px · z∈[+577,+702]mm · 9 of 31 slices shown, 12 images]
[im 3/31  brain]
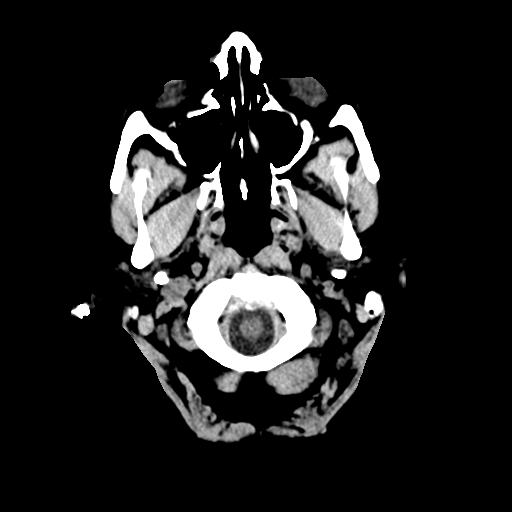
[im 3/31  bone]
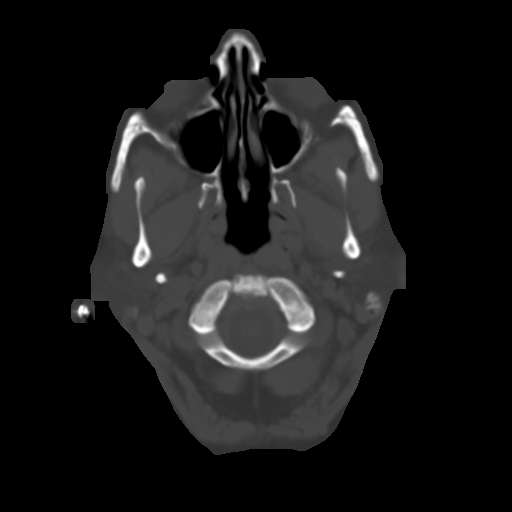
[im 6/31  brain]
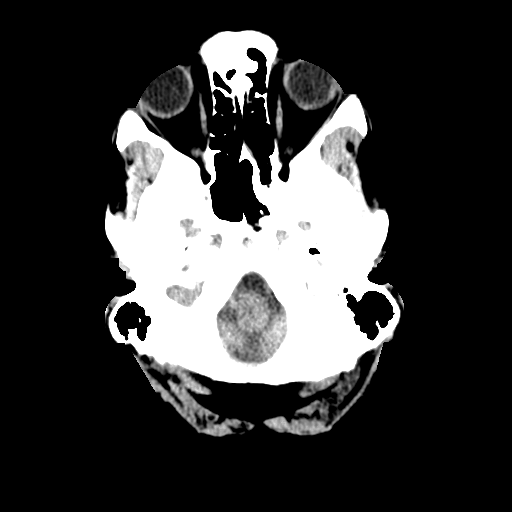
[im 9/31  brain]
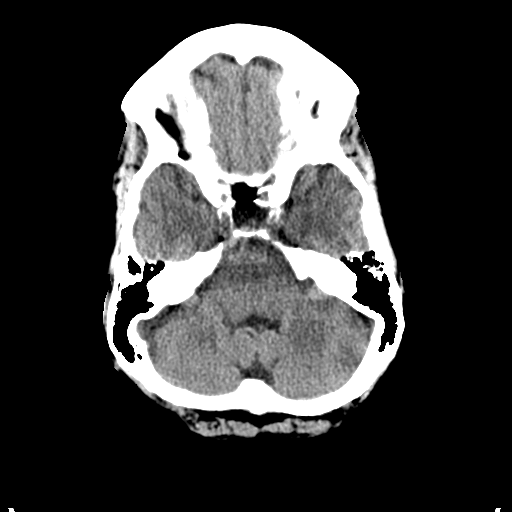
[im 12/31  brain]
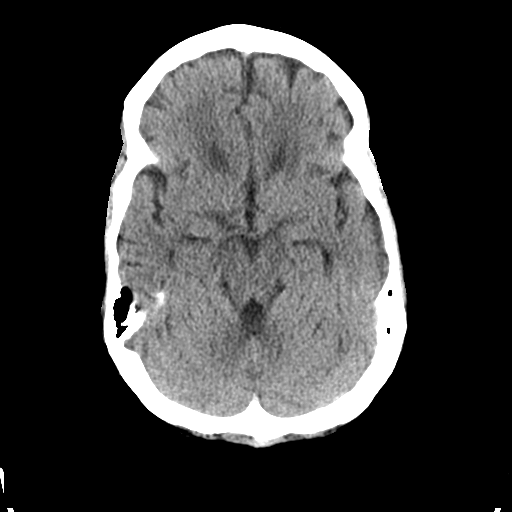
[im 16/31  brain]
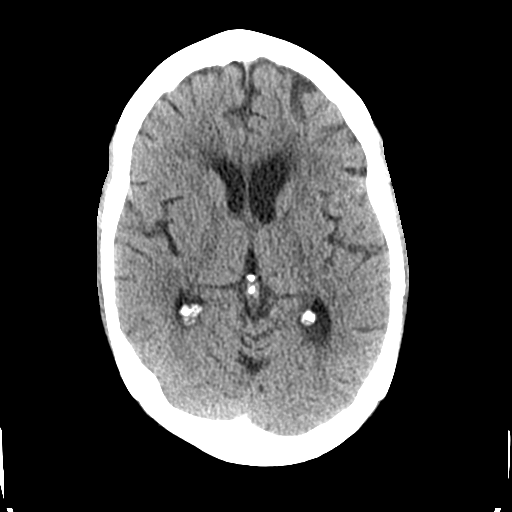
[im 16/31  bone]
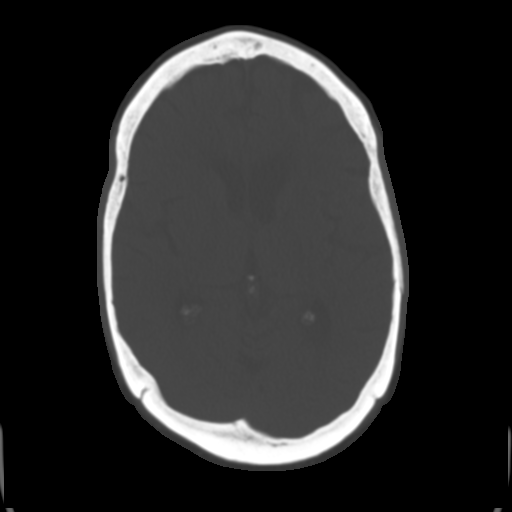
[im 19/31  brain]
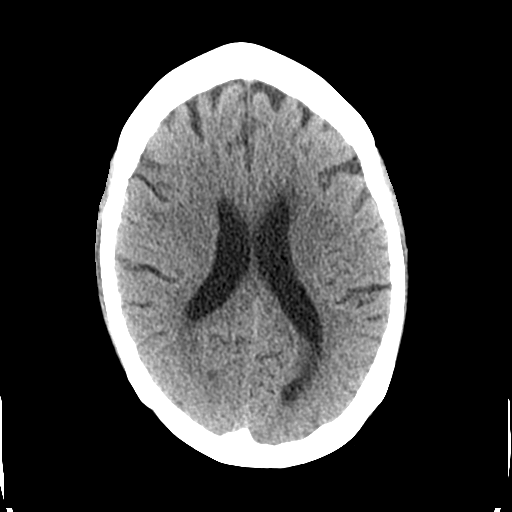
[im 22/31  brain]
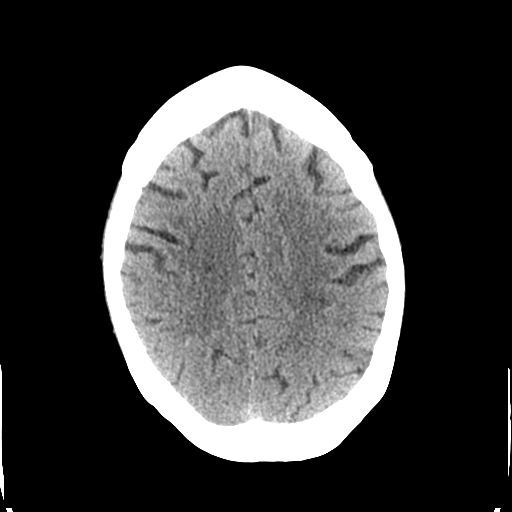
[im 25/31  brain]
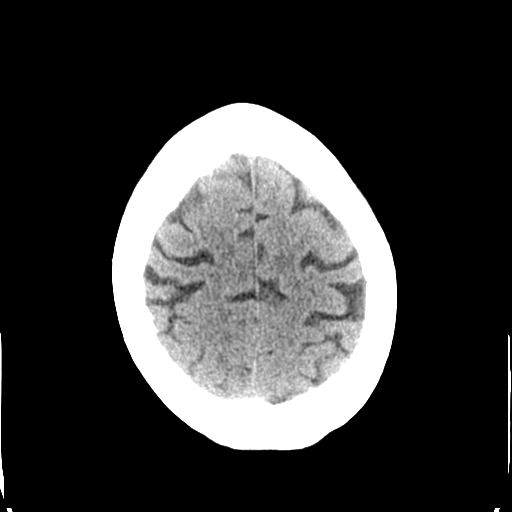
[im 28/31  brain]
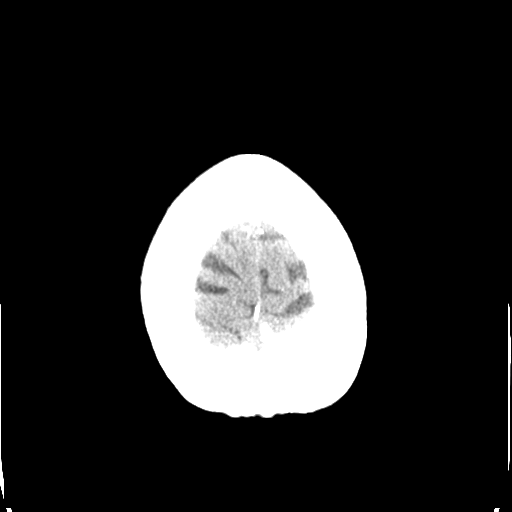
[im 28/31  bone]
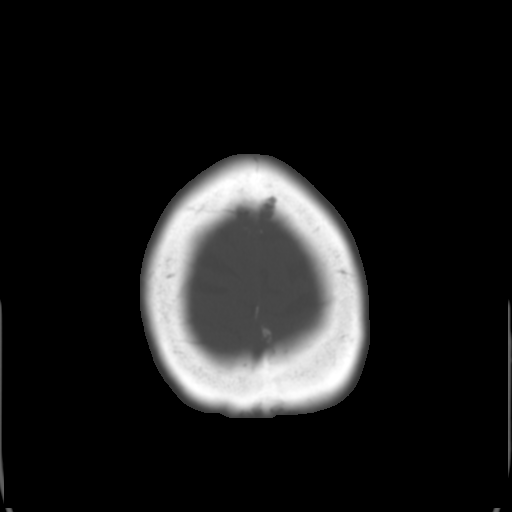

[Series 4: coronal soft tissue · coronal · 0.33mm/px · 3 of 70 slices shown]
[im 24/70  brain]
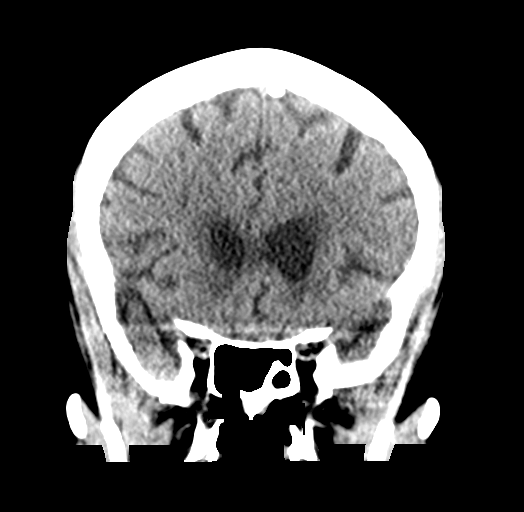
[im 31/70  brain]
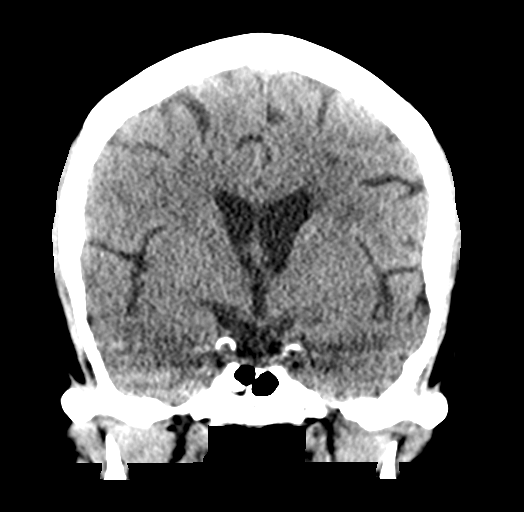
[im 39/70  brain]
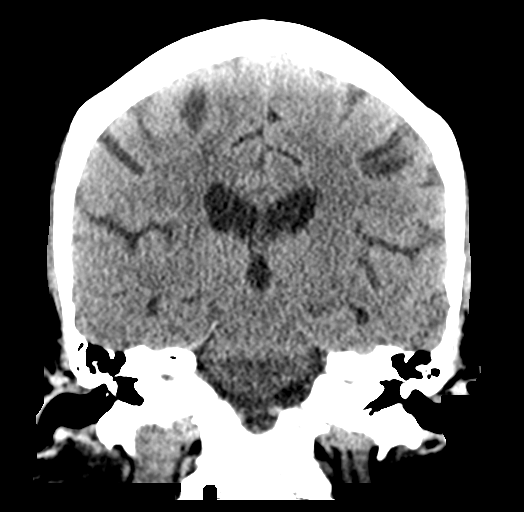

[Series 5: sagittal soft tissue · sagittal · 0.32mm/px · 3 of 57 slices shown]
[im 19/57  brain]
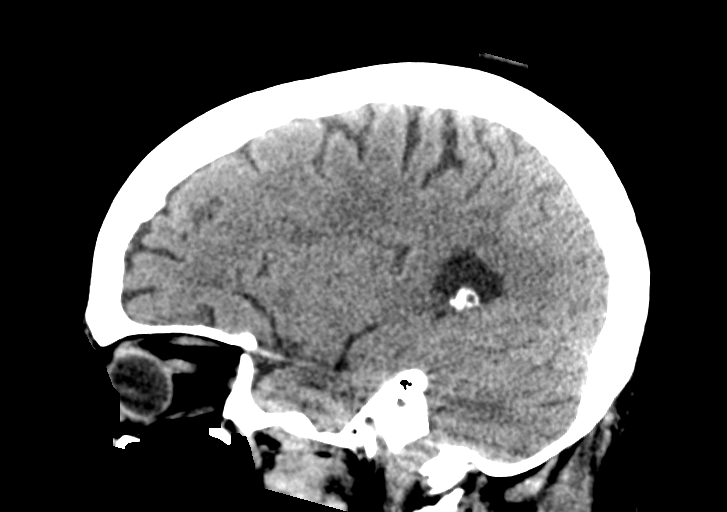
[im 29/57  brain]
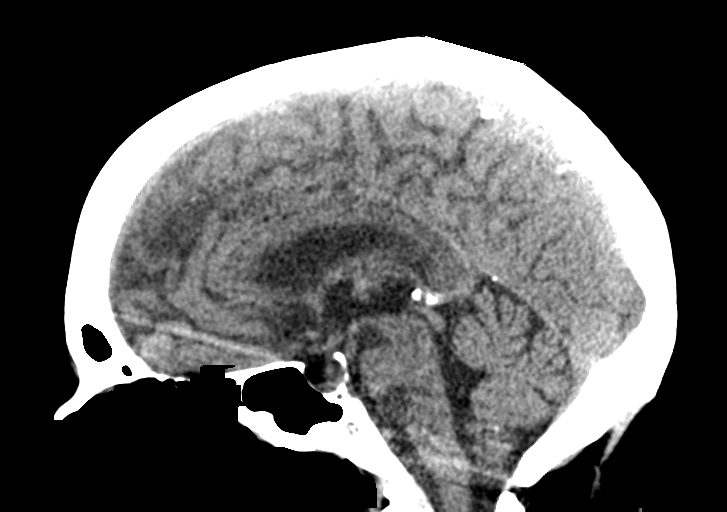
[im 38/57  brain]
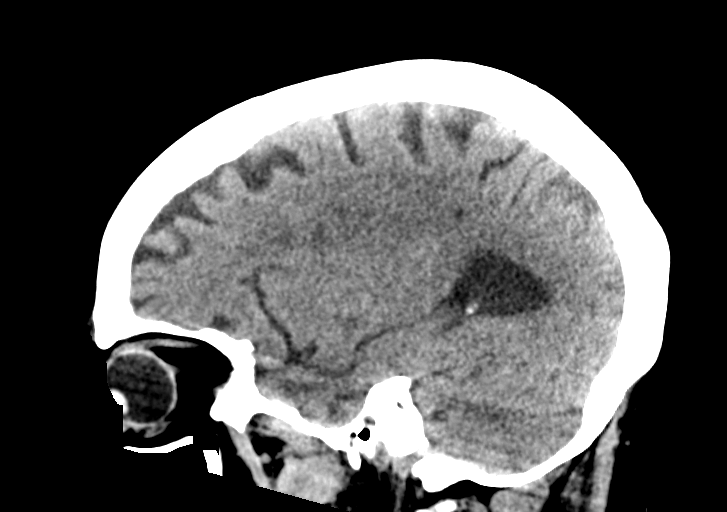

[15 of 47 positions shown; findings below may reference images not displayed]

FINDINGS: Brain:

There is no evidence of acute intracranial hemorrhage, extra-axial
fluid collection, or acute infarct.

The ventricles are normal in size. Scattered foci of hypodensity in
the subcortical and periventricular white matter are nonspecific but
likely reflect sequela of mild chronic white matter microangiopathy.

There is no mass lesion. There is no midline shift.

Vascular: No hyperdense vessel or unexpected calcification.

Skull: Normal. Negative for fracture or focal lesion.

Sinuses/Orbits: The imaged paranasal sinuses are clear. The imaged
globes and orbits are unremarkable.

Other: None.
IMPRESSION: 1. No acute intracranial pathology.
2. Mild chronic white matter microangiopathy.

## 2023-08-03 DEATH — deceased
